# Patient Record
Sex: Male | Born: 1950 | Race: Black or African American | Hispanic: No | Marital: Married | Smoking: Never smoker
Health system: Southern US, Community
[De-identification: ages and names within clinical notes are randomized; demographics above are authoritative.]

## PROBLEM LIST (undated history)

## (undated) DIAGNOSIS — T451X5A Adverse effect of antineoplastic and immunosuppressive drugs, initial encounter: Secondary | ICD-10-CM

## (undated) DIAGNOSIS — I2510 Atherosclerotic heart disease of native coronary artery without angina pectoris: Secondary | ICD-10-CM

## (undated) DIAGNOSIS — G620 Drug-induced polyneuropathy: Secondary | ICD-10-CM

## (undated) DIAGNOSIS — H269 Unspecified cataract: Secondary | ICD-10-CM

## (undated) DIAGNOSIS — R351 Nocturia: Secondary | ICD-10-CM

## (undated) DIAGNOSIS — Z8719 Personal history of other diseases of the digestive system: Secondary | ICD-10-CM

## (undated) DIAGNOSIS — N1330 Unspecified hydronephrosis: Secondary | ICD-10-CM

## (undated) DIAGNOSIS — C8190 Hodgkin lymphoma, unspecified, unspecified site: Secondary | ICD-10-CM

## (undated) DIAGNOSIS — I10 Essential (primary) hypertension: Secondary | ICD-10-CM

## (undated) DIAGNOSIS — K649 Unspecified hemorrhoids: Secondary | ICD-10-CM

## (undated) DIAGNOSIS — N289 Disorder of kidney and ureter, unspecified: Secondary | ICD-10-CM

## (undated) DIAGNOSIS — Z860100 Personal history of colon polyps, unspecified: Secondary | ICD-10-CM

## (undated) DIAGNOSIS — G4730 Sleep apnea, unspecified: Secondary | ICD-10-CM

## (undated) DIAGNOSIS — E785 Hyperlipidemia, unspecified: Secondary | ICD-10-CM

## (undated) DIAGNOSIS — E039 Hypothyroidism, unspecified: Secondary | ICD-10-CM

## (undated) DIAGNOSIS — G459 Transient cerebral ischemic attack, unspecified: Secondary | ICD-10-CM

## (undated) DIAGNOSIS — I219 Acute myocardial infarction, unspecified: Secondary | ICD-10-CM

## (undated) DIAGNOSIS — C73 Malignant neoplasm of thyroid gland: Secondary | ICD-10-CM

## (undated) DIAGNOSIS — Z9889 Other specified postprocedural states: Secondary | ICD-10-CM

## (undated) HISTORY — DX: Disorder of kidney and ureter, unspecified: N28.9

## (undated) HISTORY — PX: EYE SURGERY: SHX253

## (undated) HISTORY — PX: TONSILLECTOMY: SUR1361

## (undated) HISTORY — DX: Unspecified cataract: H26.9

## (undated) HISTORY — DX: Unspecified hydronephrosis: N13.30

## (undated) HISTORY — PX: ESOPHAGOGASTRODUODENOSCOPY: SHX1529

## (undated) HISTORY — PX: COLONOSCOPY: SHX174

## (undated) HISTORY — DX: Essential (primary) hypertension: I10

## (undated) HISTORY — DX: Atherosclerotic heart disease of native coronary artery without angina pectoris: I25.10

## (undated) HISTORY — DX: Hodgkin lymphoma, unspecified, unspecified site: C81.90

## (undated) HISTORY — DX: Hyperlipidemia, unspecified: E78.5

## (undated) HISTORY — DX: Malignant neoplasm of thyroid gland: C73

## (undated) HISTORY — PX: APPENDECTOMY: SHX54

---

## 1976-03-15 HISTORY — PX: BUNIONECTOMY: SHX129

## 1978-03-15 HISTORY — PX: VASECTOMY: SHX75

## 1997-10-21 ENCOUNTER — Ambulatory Visit (HOSPITAL_COMMUNITY): Admission: RE | Admit: 1997-10-21 | Discharge: 1997-10-21 | Payer: Self-pay | Admitting: Gastroenterology

## 2003-09-03 ENCOUNTER — Ambulatory Visit (HOSPITAL_COMMUNITY): Admission: RE | Admit: 2003-09-03 | Discharge: 2003-09-03 | Payer: Self-pay | Admitting: Gastroenterology

## 2003-09-03 ENCOUNTER — Encounter (INDEPENDENT_AMBULATORY_CARE_PROVIDER_SITE_OTHER): Payer: Self-pay | Admitting: Specialist

## 2009-03-15 DIAGNOSIS — G459 Transient cerebral ischemic attack, unspecified: Secondary | ICD-10-CM

## 2009-03-15 DIAGNOSIS — I219 Acute myocardial infarction, unspecified: Secondary | ICD-10-CM

## 2009-03-15 HISTORY — DX: Acute myocardial infarction, unspecified: I21.9

## 2009-03-15 HISTORY — DX: Transient cerebral ischemic attack, unspecified: G45.9

## 2009-09-05 ENCOUNTER — Ambulatory Visit: Payer: Self-pay | Admitting: Family Medicine

## 2009-09-24 ENCOUNTER — Ambulatory Visit (HOSPITAL_COMMUNITY): Admission: RE | Admit: 2009-09-24 | Discharge: 2009-09-24 | Payer: Self-pay | Admitting: Family Medicine

## 2009-09-24 ENCOUNTER — Ambulatory Visit: Payer: Self-pay

## 2009-09-24 ENCOUNTER — Encounter: Payer: Self-pay | Admitting: Family Medicine

## 2009-09-24 ENCOUNTER — Ambulatory Visit: Payer: Self-pay | Admitting: Cardiology

## 2009-10-06 ENCOUNTER — Ambulatory Visit: Payer: Self-pay | Admitting: Family Medicine

## 2009-10-13 ENCOUNTER — Telehealth (INDEPENDENT_AMBULATORY_CARE_PROVIDER_SITE_OTHER): Payer: Self-pay | Admitting: *Deleted

## 2009-10-13 ENCOUNTER — Ambulatory Visit: Payer: Self-pay | Admitting: Family Medicine

## 2009-10-14 ENCOUNTER — Encounter: Payer: Self-pay | Admitting: Cardiology

## 2009-10-14 ENCOUNTER — Ambulatory Visit: Payer: Self-pay

## 2009-10-14 ENCOUNTER — Inpatient Hospital Stay (HOSPITAL_COMMUNITY): Admission: AD | Admit: 2009-10-14 | Discharge: 2009-10-22 | Payer: Self-pay | Admitting: Cardiology

## 2009-10-14 ENCOUNTER — Ambulatory Visit: Payer: Self-pay | Admitting: Cardiology

## 2009-10-14 ENCOUNTER — Encounter (HOSPITAL_COMMUNITY)
Admission: RE | Admit: 2009-10-14 | Discharge: 2009-12-10 | Payer: Self-pay | Source: Home / Self Care | Admitting: Family Medicine

## 2009-10-15 ENCOUNTER — Ambulatory Visit: Payer: Self-pay | Admitting: Thoracic Surgery (Cardiothoracic Vascular Surgery)

## 2009-10-15 ENCOUNTER — Encounter: Payer: Self-pay | Admitting: Cardiovascular Disease

## 2009-10-15 ENCOUNTER — Encounter: Payer: Self-pay | Admitting: Cardiology

## 2009-10-15 HISTORY — PX: CARDIAC CATHETERIZATION: SHX172

## 2009-10-16 ENCOUNTER — Encounter: Payer: Self-pay | Admitting: Cardiology

## 2009-10-17 HISTORY — PX: CORONARY ARTERY BYPASS GRAFT: SHX141

## 2009-10-27 ENCOUNTER — Telehealth: Payer: Self-pay | Admitting: Family Medicine

## 2009-11-06 ENCOUNTER — Ambulatory Visit: Payer: Self-pay | Admitting: Cardiology

## 2009-11-07 ENCOUNTER — Telehealth (INDEPENDENT_AMBULATORY_CARE_PROVIDER_SITE_OTHER): Payer: Self-pay | Admitting: *Deleted

## 2009-11-14 ENCOUNTER — Ambulatory Visit: Payer: Self-pay | Admitting: Thoracic Surgery (Cardiothoracic Vascular Surgery)

## 2009-11-14 ENCOUNTER — Encounter
Admission: RE | Admit: 2009-11-14 | Discharge: 2009-11-14 | Payer: Self-pay | Admitting: Thoracic Surgery (Cardiothoracic Vascular Surgery)

## 2009-11-14 ENCOUNTER — Encounter: Payer: Self-pay | Admitting: Family Medicine

## 2009-11-17 ENCOUNTER — Encounter: Payer: Self-pay | Admitting: Cardiology

## 2009-11-20 ENCOUNTER — Telehealth (INDEPENDENT_AMBULATORY_CARE_PROVIDER_SITE_OTHER): Payer: Self-pay | Admitting: *Deleted

## 2009-11-20 ENCOUNTER — Encounter (HOSPITAL_COMMUNITY): Admission: RE | Admit: 2009-11-20 | Discharge: 2009-12-12 | Payer: Self-pay | Admitting: Cardiology

## 2009-12-04 ENCOUNTER — Ambulatory Visit: Payer: Self-pay | Admitting: Cardiology

## 2009-12-10 ENCOUNTER — Ambulatory Visit: Payer: Self-pay | Admitting: Cardiology

## 2009-12-10 ENCOUNTER — Encounter (INDEPENDENT_AMBULATORY_CARE_PROVIDER_SITE_OTHER): Payer: Self-pay | Admitting: *Deleted

## 2009-12-10 LAB — CONVERTED CEMR LAB
AST: 15 units/L (ref 0–37)
BUN: 15 mg/dL (ref 6–23)
Bilirubin, Direct: 0.1 mg/dL (ref 0.0–0.3)
Chloride: 104 meq/L (ref 96–112)
Cholesterol: 110 mg/dL (ref 0–200)
Creatinine, Ser: 1.2 mg/dL (ref 0.4–1.5)
Eosinophils Absolute: 0.1 10*3/uL (ref 0.0–0.7)
Eosinophils Relative: 2 % (ref 0.0–5.0)
GFR calc non Af Amer: 82.31 mL/min (ref >60)
Glucose, Bld: 91 mg/dL (ref 70–99)
HCT: 38.2 % — ABNORMAL LOW (ref 39.0–52.0)
HDL: 35.7 mg/dL — ABNORMAL LOW (ref >39.00)
Hemoglobin: 12.6 g/dL — ABNORMAL LOW (ref 13.0–17.0)
LDL Cholesterol: 65 mg/dL (ref 0–99)
Lymphs Abs: 1.5 10*3/uL (ref 0.7–4.0)
MCHC: 32.9 g/dL (ref 30.0–36.0)
Monocytes Absolute: 0.5 10*3/uL (ref 0.1–1.0)
Monocytes Relative: 7.4 % (ref 3.0–12.0)
RBC: 4.76 M/uL (ref 4.22–5.81)
Total CHOL/HDL Ratio: 3
Total Protein: 6.8 g/dL (ref 6.0–8.3)
VLDL: 9.6 mg/dL (ref 0.0–40.0)

## 2009-12-13 ENCOUNTER — Encounter (HOSPITAL_COMMUNITY)
Admission: RE | Admit: 2009-12-13 | Discharge: 2010-02-20 | Payer: Self-pay | Source: Home / Self Care | Attending: Cardiology | Admitting: Cardiology

## 2009-12-15 ENCOUNTER — Ambulatory Visit: Payer: Self-pay | Admitting: Family Medicine

## 2010-01-16 ENCOUNTER — Encounter: Payer: Self-pay | Admitting: Cardiology

## 2010-01-19 ENCOUNTER — Telehealth: Payer: Self-pay | Admitting: Family Medicine

## 2010-01-20 ENCOUNTER — Ambulatory Visit: Payer: Self-pay | Admitting: Family Medicine

## 2010-01-23 ENCOUNTER — Encounter: Payer: Self-pay | Admitting: Cardiology

## 2010-01-30 ENCOUNTER — Encounter: Payer: Self-pay | Admitting: Family Medicine

## 2010-03-31 ENCOUNTER — Telehealth (INDEPENDENT_AMBULATORY_CARE_PROVIDER_SITE_OTHER): Payer: Self-pay | Admitting: *Deleted

## 2010-04-05 ENCOUNTER — Encounter: Payer: Self-pay | Admitting: Pediatrics

## 2010-04-05 ENCOUNTER — Encounter: Payer: Self-pay | Admitting: Thoracic Surgery (Cardiothoracic Vascular Surgery)

## 2010-04-12 LAB — CONVERTED CEMR LAB
ALT: 10 units/L (ref 0–53)
AST: 19 units/L (ref 0–37)
Albumin: 4.6 g/dL (ref 3.5–5.2)
Alkaline Phosphatase: 61 units/L (ref 39–117)
BUN: 13 mg/dL (ref 6–23)
Basophils Absolute: 0 10*3/uL (ref 0.0–0.1)
Basophils Relative: 0 % (ref 0–1)
Calcium: 9.8 mg/dL (ref 8.4–10.5)
Chloride: 106 meq/L (ref 96–112)
Creatinine, Ser: 1.33 mg/dL (ref 0.40–1.50)
Eosinophils Absolute: 0.1 10*3/uL (ref 0.0–0.7)
Eosinophils Relative: 1.8 % (ref 0.0–5.0)
GFR calc non Af Amer: 84.01 mL/min (ref >60)
Glucose, Bld: 109 mg/dL — ABNORMAL HIGH (ref 70–99)
H Pylori IgG: NEGATIVE
HCT: 42.4 % (ref 39.0–52.0)
HDL: 50 mg/dL (ref >39)
Lymphocytes Relative: 35 % (ref 12–46)
MCHC: 33.4 g/dL (ref 30.0–36.0)
MCHC: 34.6 g/dL (ref 30.0–36.0)
MCV: 83.5 fL (ref 78.0–100.0)
MCV: 85 fL (ref 78.0–100.0)
Monocytes Absolute: 0.6 10*3/uL (ref 0.1–1.0)
Monocytes Relative: 7 % (ref 3–12)
Monocytes Relative: 8.1 % (ref 3.0–12.0)
Neutro Abs: 4.9 10*3/uL (ref 1.4–7.7)
Neutrophils Relative %: 65.4 % (ref 43.0–77.0)
Platelets: 310 10*3/uL (ref 150.0–400.0)
Potassium: 4.5 meq/L (ref 3.5–5.1)
RBC: 5.45 M/uL (ref 4.22–5.81)
RDW: 14.8 % (ref 11.5–15.5)
RDW: 15.2 % — ABNORMAL HIGH (ref 11.5–14.6)
Sodium: 137 meq/L (ref 135–145)
Sodium: 143 meq/L (ref 135–145)
TSH: 0.16 microintl units/mL — ABNORMAL LOW (ref 0.35–5.50)
WBC: 6.9 10*3/uL (ref 4.0–10.5)

## 2010-04-16 NOTE — Letter (Signed)
Summary: [facility]  [facility]   Imported By: Marylou Mccoy 12/15/2009 10:47:19  _____________________________________________________________________  External Attachment:    Type:   Image     Comment:   External Document

## 2010-04-16 NOTE — Assessment & Plan Note (Signed)
Summary: 4-5 WKS   Visit Type:  Follow-up Primary Journey [place]:  Laury Axon  CC:  Pt concerned about his heartrate in the 90's and 80's-Pt also wants to know when can he return to work.  History of Present Illness: Devon Brady is 60 years old and return for management of CAD. 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 He's been involved in the rehabilitation program since that time and has done very well. She had no recent chest pain shortness of breath or palpitations.  His other problems include hypertension hyperlipidemia.  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  Current Medications (verified): 1)  Metoprolol Succinate 25 Mg Xr24h-Tab (Metoprolol Succinate) .... Take One Tablet By Mouth Daily 2)  Aspirin 325 Mg Tabs (Aspirin) .Marland Kitchen.. 1 By Mouth Once Daily 3)  Crestor 20 Mg Tabs (Rosuvastatin Calcium) .... Take One Tablet By Mouth Daily.  Allergies (verified): No Known Drug Allergies  Past History:  Past Medical History: Reviewed history from 09/05/2009 and no changes required. Hyperlipidemia Hypertension  Review of Systems       ROS is negative except as outlined in HPI.   Vital Signs:  Patient profile:   60 year old male Height:      71 inches Weight:      246 pounds BMI:     34.43 Pulse rate:   82 / minute BP sitting:   108 / 73  (left arm) Cuff size:   large  Vitals Entered By: Burnett Kanaris, CNA (December 10, 2009 11:03 AM)  Physical Exam  Additional Exam:  Gen. Well-nourished, in no distress   Neck: No JVD, thyroid not enlarged, no carotid bruits Lungs: No tachypnea, clear without rales, rhonchi or wheezes Cardiovascular: Rhythm regular, PMI not displaced,  heart sounds  normal, no murmurs or gallops, no peripheral edema, pulses normal in all 4 extremities. Abdomen: BS normal, abdomen soft and non-tender without masses or organomegaly, no hepatosplenomegaly. MS: No  deformities, no cyanosis or clubbing   Neuro:  No focal sns   Skin:  no lesions    Impression & Recommendations:  Problem # 1:  CAD, AUTOLOGOUS BYPASS GRAFT (ICD-414.02)  His almost 2 months status post bypass surgery. He's had no chest pain this problem appears stable. We will clear him to return to work on October 10. His updated medication list for this problem includes:    Metoprolol Succinate 50 Mg Xr24h-tab (Metoprolol succinate) .Marland Kitchen... Take one tablet by mouth daily    Aspirin 325 Mg Tabs (Aspirin) .Marland Kitchen... 1 by mouth once daily  His updated medication list for this problem includes:    Metoprolol Succinate 25 Mg Xr24h-tab (Metoprolol succinate) .Marland Kitchen... Take one tablet by mouth daily    Aspirin 325 Mg Tabs (Aspirin) .Marland Kitchen... 1 by mouth once daily  Orders: EKG w/ Interpretation (93000) TLB-TSH (Thyroid Stimulating Hormone) (84443-TSH)  Problem # 2:  HYPERTENSION (ICD-401.9)  This is been well-controlled on current medications. His updated medication list for this problem includes:    Metoprolol Succinate 50 Mg Xr24h-tab (Metoprolol succinate) .Marland Kitchen... Take one tablet by mouth daily    Aspirin 325 Mg Tabs (Aspirin) .Marland Kitchen... 1 by mouth once daily  His updated medication list for this problem includes:    Metoprolol Succinate 25 Mg Xr24h-tab (Metoprolol succinate) .Marland Kitchen... Take one tablet by mouth daily    Aspirin 325 Mg Tabs (Aspirin) .Marland Kitchen... 1 by mouth once daily  Problem # 3:  SINUS TACHYCARDIA (ICD-427.89) He has borderline sinus tachycardia with no obvious cause. His hemoglobin was okay recently. We will get a TSH. Increase his Toprol from 25 to 50 a day. His updated medication list for this problem includes:    Metoprolol Succinate 50 Mg Xr24h-tab (Metoprolol succinate) .Marland Kitchen... Take one tablet by mouth daily    Aspirin 325 Mg Tabs (Aspirin) .Marland Kitchen... 1 by mouth once daily  Problem # 4:  HYPERLIPIDEMIA (ICD-272.4) His recent lipid profile was very good with the exception of an HDL of 36. I  encouraged him to eliminate the back carbohydrates and get into more exercise. His updated medication list for this problem includes:    Crestor 20 Mg Tabs (Rosuvastatin calcium) .Marland Kitchen... Take one tablet by mouth daily.  Patient Instructions: 1)  Your physician recommends that you have lab work today: tsh (414.01) 2)  Increase Toprol (metoprolol) to 50mg  once daily. 3)  Your physician wants you to follow-up in: 6 months with Dr. Clifton James. You will receive a reminder letter in the mail two months in advance. If you don't receive a letter, please call our office to schedule the follow-up appointment.

## 2010-04-16 NOTE — Letter (Signed)
Summary: [facility]   Imported By: Marylou Mccoy 01/02/2010 15:40:28  _____________________________________________________________________  External Attachment:    Type:   Image     Comment:   External Document

## 2010-04-16 NOTE — Assessment & Plan Note (Signed)
Summary: new to est//dr Salma ref//lch   Vital Signs:  Patient profile:   60 year old male Height:      71 inches Weight:      266.31 pounds BMI:     37.28 Temp:     98.1 degrees F oral Pulse rate:   82 / minute Pulse rhythm:   regular BP sitting:   138 / 82  (left arm) Cuff size:   large  Vitals Entered By: Army Fossa CMA (September 05, 2009 2:06 PM) CC: Pt here to est, and CPX   History of Present Illness: Pt here for cpe and to established.  Pt has a hx of high cholesterol and bp. Crestor caused muscle aches.  He was never on bp meds.    Preventive Screening-Counseling & Management  Alcohol-Tobacco     Alcohol drinks/day: <1     Alcohol type: beer     Smoking Status: never  Caffeine-Diet-Exercise     Caffeine use/day: <1     Diet Comments: healthy eating     Does Patient Exercise: yes     Type of exercise: walking     Exercise (avg: min/session): 30-60     Times/week: 3  Hep-HIV-STD-Contraception     Dental Visit-last 6 months yes     Dental Care Counseling: not indicated; dental care within six months      Sexual History:  currently monogamous.        Drug Use:  no.    Current Medications (verified): 1)  None  Allergies (verified): No Known Drug Allergies  Past History:  Family History: Last updated: 09/05/2009 Family History of Alcoholism/Addiction Family History of Arthritis Family History Diabetes 1st degree relative--dad Family History Kidney disease Family History of Prostate CA --great uncle Family History High cholesterol Family History Hypertension  Social History: Last updated: 09/05/2009 Married Never Smoked Alcohol use-yes Drug use-no Regular exercise-no Occupation:  Regional environmental specialist  Risk Factors: Alcohol Use: <1 (09/05/2009) Caffeine Use: <1 (09/05/2009) Diet: healthy eating (09/05/2009) Exercise: yes (09/05/2009)  Risk Factors: Smoking Status: never (09/05/2009)  Past Medical  History: Hyperlipidemia Hypertension  Past Surgical History: Vasectomy bunionectomy Tonsillectomy  Family History: Reviewed history and no changes required. Family History of Alcoholism/Addiction Family History of Arthritis Family History Diabetes 1st degree relative--dad Family History Kidney disease Family History of Prostate CA --great uncle Family History High cholesterol Family History Hypertension  Social History: Reviewed history and no changes required. Married Never Smoked Alcohol use-yes Drug use-no Regular exercise-no Occupation:  Archivist Smoking Status:  never Drug Use:  no Does Patient Exercise:  yes Caffeine use/day:  <1 Dental Care w/in 6 mos.:  yes Sexual History:  currently monogamous Occupation:  employed  Review of Systems      See HPI General:  Denies chills, fatigue, fever, loss of appetite, malaise, sleep disorder, sweats, weakness, and weight loss. Eyes:  Denies blurring, discharge, double vision, eye irritation, eye pain, halos, itching, light sensitivity, red eye, vision loss-1 eye, and vision loss-both eyes; optho--q2y. ENT:  Denies decreased hearing, difficulty swallowing, ear discharge, earache, hoarseness, nasal congestion, nosebleeds, postnasal drainage, ringing in ears, sinus pressure, and sore throat. CV:  Denies bluish discoloration of lips or nails, chest pain or discomfort, difficulty breathing at night, difficulty breathing while lying down, fainting, fatigue, leg cramps with exertion, lightheadness, near fainting, palpitations, shortness of breath with exertion, swelling of feet, swelling of hands, and weight gain. Resp:  Denies chest discomfort, chest pain with inspiration, cough, coughing up  blood, excessive snoring, hypersomnolence, morning headaches, pleuritic, shortness of breath, sputum productive, and wheezing. GI:  Denies abdominal pain, bloody stools, change in bowel habits, constipation, dark tarry  stools, diarrhea, excessive appetite, gas, hemorrhoids, indigestion, loss of appetite, and nausea. GU:  Denies decreased libido, discharge, dysuria, erectile dysfunction, genital sores, hematuria, incontinence, nocturia, urinary frequency, and urinary hesitancy. MS:  Denies joint pain, joint redness, joint swelling, loss of strength, low back pain, mid back pain, muscle aches, muscle , cramps, muscle weakness, stiffness, and thoracic pain. Derm:  Denies changes in color of skin, changes in nail beds, dryness, excessive perspiration, flushing, hair loss, insect bite(s), itching, lesion(s), poor wound healing, and rash. Neuro:  Denies brief paralysis, difficulty with concentration, disturbances in coordination, falling down, headaches, inability to speak, memory loss, numbness, poor balance, seizures, sensation of room spinning, tingling, tremors, visual disturbances, and weakness. Psych:  Denies alternate hallucination ( auditory/visual), anxiety, depression, easily angered, easily tearful, irritability, mental problems, panic attacks, sense of great danger, suicidal thoughts/plans, thoughts of violence, unusual visions or sounds, and thoughts /plans of harming others. Endo:  Denies cold intolerance, excessive hunger, excessive thirst, excessive urination, heat intolerance, polyuria, and weight change. Heme:  Denies abnormal bruising, bleeding, enlarge lymph nodes, fevers, pallor, and skin discoloration. Allergy:  Denies hives or rash, itching eyes, persistent infections, seasonal allergies, and sneezing.  Physical Exam  General:  Well-developed,well-nourished,in no acute distress; alert,appropriate and cooperative throughout examination Head:  Normocephalic and atraumatic without obvious abnormalities. No apparent alopecia or balding. Eyes:  pupils equal, pupils round, pupils reactive to light, and no injection.   Ears:  External ear exam shows no significant lesions or deformities.  Otoscopic  examination reveals clear canals, tympanic membranes are intact bilaterally without bulging, retraction, inflammation or discharge. Hearing is grossly normal bilaterally. Nose:  External nasal examination shows no deformity or inflammation. Nasal mucosa are pink and moist without lesions or exudates. Mouth:  Oral mucosa and oropharynx without lesions or exudates.   Neck:  No deformities, masses, or tenderness noted.no carotid bruits.   Chest Wall:  No deformities, masses, tenderness or gynecomastia noted. Lungs:  Normal respiratory effort, chest expands symmetrically. Lungs are clear to auscultation, no crackles or wheezes. Heart:  normal rate and Grade 2  /6 systolic ejection murmur.   Abdomen:  Bowel sounds positive,abdomen soft and non-tender without organomegaly or hernias noted. + lipoma L side abd Rectal:  No external abnormalities noted. Normal sphincter tone. No rectal masses or tenderness. heme negative brown stool Genitalia:  Testes bilaterally descended without nodularity, tenderness or masses. No scrotal masses or lesions. No penis lesions or urethral discharge. Prostate:  Prostate gland firm and smooth, no enlargement, nodularity, tenderness, mass, asymmetry or induration. Msk:  normal ROM, no joint tenderness, no joint swelling, no joint warmth, no redness over joints, no joint deformities, no joint instability, and no crepitation.   Pulses:  R posterior tibial normal, R dorsalis pedis normal, R carotid normal, L posterior tibial normal, L dorsalis pedis normal, and L carotid normal.   Extremities:  No clubbing, cyanosis, edema, or deformity noted with normal full range of motion of all joints.   Neurologic:  No cranial nerve deficits noted. Station and gait are normal. Plantar reflexes are down-going bilaterally. DTRs are symmetrical throughout. Sensory, motor and coordinative functions appear intact. Skin:  Intact without suspicious lesions or rashes Cervical Nodes:  No  lymphadenopathy noted Psych:  Cognition and judgment appear intact. Alert and cooperative with normal attention span and concentration. No  apparent delusions, illusions, hallucinations   Impression & Recommendations:  Problem # 1:  PREVENTIVE HEALTH CARE (ICD-V70.0)  Orders: Venipuncture (04540) TLB-Lipid Panel (80061-LIPID) TLB-BMP (Basic Metabolic Panel-BMET) (80048-METABOL) TLB-CBC Platelet - w/Differential (85025-CBCD) TLB-Hepatic/Liver Function Pnl (80076-HEPATIC) TLB-TSH (Thyroid Stimulating Hormone) (84443-TSH) TLB-PSA (Prostate Specific Antigen) (84153-PSA) EKG w/ Interpretation (93000)  Reviewed preventive care protocols, scheduled due services, and updated immunizations.  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)  Orders: Venipuncture (98119) TLB-Lipid Panel (80061-LIPID) TLB-BMP (Basic Metabolic Panel-BMET) (80048-METABOL) TLB-CBC Platelet - w/Differential (85025-CBCD) TLB-Hepatic/Liver Function Pnl (80076-HEPATIC) TLB-TSH (Thyroid Stimulating Hormone) (84443-TSH) TLB-PSA (Prostate Specific Antigen) (84153-PSA) EKG w/ Interpretation (93000)  Problem # 4:  HYPERLIPIDEMIA (ICD-272.4)  Orders: Venipuncture (14782) TLB-Lipid Panel (80061-LIPID) TLB-BMP (Basic Metabolic Panel-BMET) (80048-METABOL) TLB-CBC Platelet - w/Differential (85025-CBCD) TLB-Hepatic/Liver Function Pnl (80076-HEPATIC) TLB-TSH (Thyroid Stimulating Hormone) (84443-TSH) TLB-PSA (Prostate Specific Antigen) (84153-PSA) EKG w/ Interpretation (93000)  Problem # 5:  HYPERTENSION (ICD-401.9) Assessment: Improved  Orders: Venipuncture (95621) TLB-Lipid Panel (80061-LIPID) TLB-BMP (Basic Metabolic Panel-BMET) (80048-METABOL) TLB-CBC Platelet - w/Differential (85025-CBCD) TLB-Hepatic/Liver Function Pnl (80076-HEPATIC) TLB-TSH (Thyroid Stimulating Hormone)  (84443-TSH) TLB-PSA (Prostate Specific Antigen) (84153-PSA) EKG w/ Interpretation (93000)  BP today: 138/82   EKG  Procedure date:  09/05/2009  Findings:      Normal sinus rhythm with rate of:  88 bpm    Flu Vaccine Next Due:  Refused Colonoscopy Result Date:  09/25/2008 Colonoscopy Result:  polyps Colonoscopy Next Due:  5 yr  Appended Document: new to est//dr Salma ref//lch  Laboratory Results   Urine Tests   Date/Time Reported: September 05, 2009 3:07 PM   Routine Urinalysis   Color: yellow Appearance: Clear Glucose: negative   (Normal Range: Negative) Bilirubin: negative   (Normal Range: Negative) Ketone: smal (15)   (Normal Range: Negative) Spec. Gravity: >=1.030   (Normal Range: 1.003-1.035) Blood: trace-lysed   (Normal Range: Negative) pH: 5.0   (Normal Range: 5.0-8.0) Protein: 30   (Normal Range: Negative) Urobilinogen: negative   (Normal Range: 0-1) Nitrite: negative   (Normal Range: Negative) Leukocyte Esterace: negative   (Normal Range: Negative)    Comments: Floydene Flock  September 05, 2009 3:08 PM cx sent

## 2010-04-16 NOTE — Consult Note (Signed)
[facility]   Imported By: Roderic Ovens 10/22/2009 16:07:28  _____________________________________________________________________  External Attachment:    Type:   Image     Comment:   External Document

## 2010-04-16 NOTE — Progress Notes (Signed)
  Pt left FMLA papers @ visit yesterday sent to [facility]  November 07, 2009 11:17 AM

## 2010-04-16 NOTE — Assessment & Plan Note (Signed)
Summary: chest pressure x 2weeks//lch   Vital Signs:  Patient profile:   60 year old male Height:      71 inches Weight:      271 pounds Temp:     98.3 degrees F oral Pulse rate:   80 / minute BP sitting:   140 / 100  (left arm)  Vitals Entered By: Jeremy Johann CMA (October 06, 2009 11:37 AM) CC: pressure and burning in chest increase with ambulation   History of Present Illness:       This is a Devon Brady who presents with Chest Pain.  The symptoms began 2 weeks ago.  Pt here c/o chest pressure with exertion for 2 weeks.  It goes away with rest.  The patient reports exertional chest pain, but denies resting chest pain, nausea, vomiting, diaphoresis, shortness of breath, palpitations, dizziness, light headedness, syncope, and indigestion.  The pain is described as intermittent and pressure-like.  The pain is located in the substernal area.  The pain radiates to the left upper chest and left shoulder.  Episodes of chest pain last 2-5 minutes.  The pain is brought on or made worse by moderate activity and strenuous activity.  The pain is relieved or improved with rest.  Pt also states pain occurs with certain foods---ie peanuts.    Current Medications (verified): 1)  Toprol Xl 50 Mg Xr24h-Tab (Metoprolol Succinate) .Marland Kitchen.. 1 By Mouth Once Daily 2)  Aspir-Low 81 Mg Tbec (Aspirin) .Marland Kitchen.. 1 By Mouth Once Daily  Allergies (verified): No Known Drug Allergies  Past History:  Past medical, surgical, family and social histories (including risk factors) reviewed for relevance to current acute and chronic problems.  Past Medical History: Reviewed history from 09/05/2009 and no changes required. Hyperlipidemia Hypertension  Past Surgical History: Reviewed history from 09/05/2009 and no changes required. Vasectomy bunionectomy Tonsillectomy  Family History: Reviewed history from 09/05/2009 and no changes required. Family History of Alcoholism/Addiction Family History of  Arthritis Family History Diabetes 1st degree relative--dad Family History Kidney disease Family History of Prostate CA --great uncle Family History High cholesterol Family History Hypertension  Social History: Reviewed history from 09/05/2009 and no changes required. Married Never Smoked Alcohol use-yes Drug use-no Regular exercise-no Occupation:  Archivist  Review of Systems      See HPI  Physical Exam  General:  Well-developed,well-nourished,in no acute distress; alert,appropriate and cooperative throughout examination Lungs:  Normal respiratory effort, chest expands symmetrically. Lungs are clear to auscultation, no crackles or wheezes. Heart:  normal rate and Grade  2 /6 systolic ejection murmur.   Abdomen:  Bowel sounds positive,abdomen soft and non-tender without masses, organomegaly or hernias noted. Cervical Nodes:  No lymphadenopathy noted Psych:  Cognition and judgment appear intact. Alert and cooperative with normal attention span and concentration. No apparent delusions, illusions, hallucinations   Impression & Recommendations:  Problem # 1:  CHEST PAIN UNSPECIFIED (ICD-786.50)  Orders: Venipuncture (84696) TLB-BMP (Basic Metabolic Panel-BMET) (80048-METABOL) TLB-CBC Platelet - w/Differential (85025-CBCD) TLB-Hepatic/Liver Function Pnl (80076-HEPATIC) TLB-Cardiac Panel ([id number]-MWNU) TLB-H. Pylori Abs(Helicobacter Pylori) (86677-HELICO) Cardiolite (Cardiolite) EKG w/ Interpretation (93000)  Problem # 2:  DYSPEPSIA (ICD-536.8) GI cocktail given in office Take prilosec otc 1 by mouth once daily  Orders: Venipuncture (27253) TLB-BMP (Basic Metabolic Panel-BMET) (80048-METABOL) TLB-CBC Platelet - w/Differential (85025-CBCD) TLB-Hepatic/Liver Function Pnl (80076-HEPATIC) TLB-Cardiac Panel ([id number]) TLB-H. Pylori Abs(Helicobacter Pylori) (86677-HELICO) EKG w/ Interpretation (93000)  Problem # 3:  HYPERTENSION  (ICD-401.9)  His updated medication list for this problem includes:  Toprol Xl 50 Mg Xr24h-tab (Metoprolol succinate) .Marland Kitchen... 1 by mouth once daily  Orders: Cardiolite (Cardiolite) EKG w/ Interpretation (93000)  BP today: 140/100 Prior BP: 138/82 (09/05/2009)  Labs Reviewed: K+: 4.1 (09/05/2009) Creat: : 1.33 (09/05/2009)   Chol: 253 (09/05/2009)   HDL: 50 (09/05/2009)   LDL: 188 (09/05/2009)   TG: 77 (09/05/2009)  Problem # 4:  HYPERLIPIDEMIA (ICD-272.4)  Labs Reviewed: SGOT: 19 (09/05/2009)   SGPT: 10 (09/05/2009)   HDL:50 (09/05/2009)  LDL:188 (09/05/2009)  Chol:253 (09/05/2009)  Trig:77 (09/05/2009)  Orders: EKG w/ Interpretation (93000)  Complete Medication List: 1)  Toprol Xl 50 Mg Xr24h-tab (Metoprolol succinate) .Marland Kitchen.. 1 by mouth once daily 2)  Aspirin 325 Mg Tabs (Aspirin) .Marland Kitchen.. 1 by mouth once daily  Patient Instructions: 1)  rto 1 week  2)  if chest pain returns go to ER  Prescriptions: TOPROL XL 50 MG XR24H-TAB (METOPROLOL SUCCINATE) 1 by mouth once daily  #30 x 2   Entered and Authorized by:   Loreen Freud DO   Signed by:   Loreen Freud DO on 10/06/2009   Method used:   Electronically to        [facility]  [address] [phone][facility] (retail)       [address]       [place], [place]  [address]       Ph: [phone]       Fax: [phone]   RxID:   [id number]    EKG  Procedure date:  10/06/2009  Findings:      Normal sinus rhythm with rate of:  84 Inc RBBB

## 2010-04-16 NOTE — Letter (Signed)
Summary: Fax Regarding Disease Mgmt Program/[facility]  Fax Regarding Disease Mgmt Program/[facility]   Imported By: Lanelle Bal 02/09/2010 11:52:49  _____________________________________________________________________  External Attachment:    Type:   Image     Comment:   External Document

## 2010-04-16 NOTE — Assessment & Plan Note (Signed)
Summary: np6   Visit Type:  Initial Consult Primary Devon Brady:  Devon Brady  CC:  results of stress test.  History of Present Illness: The patient is 60 years old and is seen today as an add-on because of an abnormal Myoview scan. He has been having symptoms of exertional chest pain for probably 2-3 months. These symptoms have been somewhat worse in the last 2 weeks. He notices the symptoms when he does such activities as mowing the lawn.  some s not had any rest chest pain but he has had some mild sensation in his throat sometimes comes on at rest that he attributed to heartburn.  Dr. Sol Brady and scheduled him for a Myoview scan today he completed 2 stages of 3 of the Bruce protocol and had chest pain ECG changes and a very large defect involving the septum anterior apical and inferior walls.  His other problems include hypertension which has been treated and hyperlipidemia which has been treated only with diet.  He said Dr. Laury Brady heard a murmur yesterday.  Current Medications (verified): 1)  Toprol Xl 100 Mg Xr24h-Tab (Metoprolol Succinate) .Marland Kitchen.. 1 By Mouth Once Daily 2)  Aspirin 325 Mg Tabs (Aspirin) .Marland Kitchen.. 1 By Mouth Once Daily 3)  Prilosec Otc 20 Mg Tbec (Omeprazole Magnesium) .Marland Kitchen.. 1 By Mouth Qam  Allergies (verified): No Known Drug Allergies  Past History:  Past Medical History: Reviewed history from 09/05/2009 and no changes required. Hyperlipidemia Hypertension  Family History: Reviewed history from 09/05/2009 and no changes required. Family History of Alcoholism/Addiction Family History of Arthritis Family History Diabetes 1st degree relative--dad Family History Kidney disease Family History of Prostate CA --great uncle Family History High cholesterol Family History Hypertension  Social History: Reviewed history from 09/05/2009 and no changes required. Married Never Smoked Alcohol use-yes Drug use-no Regular exercise-no Occupation:  Regional environmental  specialist  Review of Systems       ROS is negative except as outlined in HPI.   Vital Signs:  Patient profile:   60 year old male Height:      71 inches Weight:      271 pounds BMI:     37.99 Pulse rate:   76 / minute BP sitting:   140 / 88  (left arm)  Physical Exam  Additional Exam:  Gen. Well-nourished, in no distress Head: Normocephalic    Eyes: PERRLA/EOM intact; conjunctiva and lids normal Neck: No JVD, thyroid not enlarged, no carotid bruits Lungs: No tachypnea, clear w/o rales, rhonchi or wheezes CV: Rhythm regular, PMI not displaced,  sounds  nl, no murmurs or gallops, no edema, pulses nl UE and LE Abd: BS nl, abd soft and non-tender w/o masses or hepatosplenomegaly MS: No deformities Neuro: no focal sns Skin: no lesions Psych: nl affect    Impression & Recommendations:  Problem # 1:  CHEST PAIN UNSPECIFIED (ICD-786.50) He has typical exertional angina and now has an abnormal stress test and Myoview scan with a large stress-induced defect.  admission today and catheterization tomorrow.plan admission today and catheterization tomorrow. His updated medication list for this problem includes:    Toprol Xl 100 Mg Xr24h-tab (Metoprolol succinate) .Marland Kitchen... 1 by mouth once daily    Aspirin 325 Mg Tabs (Aspirin) .Marland Kitchen... 1 by mouth once daily  Problem # 2:  HYPERTENSION (ICD-401.9) This is well-controlled on current medications. His updated medication list for this problem includes:    Toprol Xl 100 Mg Xr24h-tab (Metoprolol succinate) .Marland Kitchen... 1 by mouth once daily    Aspirin 325  Mg Tabs (Aspirin) .Marland Kitchen... 1 by mouth once daily  Problem # 3:  HYPERLIPIDEMIA (ICD-272.4) We will start him on a statin tonight prior to his procedure tomorrow.  Patient Instructions: 1)  You are being admitted directly to [facility] today.

## 2010-04-16 NOTE — Letter (Signed)
Summary: Return To Work  [employer], Main Office  [address]   [place], [place] [address]   Phone: [phone]  Fax: [phone]    12/10/2009  TO: Leodis Sias IT MAY CONCERN   RE: Devon Brady [address] [place]   The above named individual is under my medical care and may return to work on: Monday 12/22/09 without restrictions.  If you have any further questions or need additional information, please call.     Sincerely,   Charlies Constable, MD Sherri Rad, RN, BSN

## 2010-04-16 NOTE — Progress Notes (Signed)
Summary: CARDIOLITE TEST DENIED   Phone Note Other Incoming Call back at [phone]   Caller: Sharyne Richters WITH [facility] Summary of Call: FYI IN REFERENCE TO CARDIOLITE STRESS TEST PERFORMED ON 10-14-2009, I JUST REC'D CALL FROM PT ACCOUNTING, SYVIA.  SYLVIA STATES THIS PATIENT'S CLAIM HAS BEEN DENIED BY HER INSURANCE [employer], FOR THE TEST OF CARDIOLITE STRESS TEST ON 10-14-2009.  THERE WAS NO PRE-CERT DONE FOR THIS TEST PRIOR TO SCHEDULING.  SYLVIA HAS RECORDING MY NAME AS A CONTACT FOR THIS ACCOUNT & STATES THAT SINCE NO PRE-CERT WAS DONE, IT DOES HER NO GOOD. Initial call taken by: Magdalen Spatz [facility],  March 31, 2010 11:22 AM  Follow-up for Phone Call        There was a pre-cert done for this. The Auth # is: [id number]. I am unsure of why it would be showing one was not done. Will call Nettie Elm to inform her of this.   Left messasge for Nettie Elm to call back. Follow-up by: Harold Barban,  April 02, 2010 8:26 AM  Additional Follow-up for Phone Call Additional follow up Details #1::        Spoke with Nettie Elm and gave her th Auth # and the date it was recieved. She will take this back to [facility].  Additional Follow-up by: Harold Barban,  April 02, 2010 11:16 AM

## 2010-04-16 NOTE — Assessment & Plan Note (Signed)
Summary: 1 WEEK FOLLOWUP////SPH   Vital Signs:  Patient profile:   60 year old male Height:      71 inches (180.34 cm) Weight:      271.38 pounds (123.35 kg) BMI:     37.99 Temp:     98.0 degrees F (36.67 degrees C) oral BP sitting:   140 / 90  (right arm) Cuff size:   large  Vitals Entered By: Lucious Groves CMA (October 13, 2009 10:52 AM) CC: 1 week follow up./kb Is Patient Diabetic? No Pain Assessment Patient in pain? no        History of Present Illness:  Hypertension follow-up      This is a 60 year old man who presents for Hypertension follow-up.  The patient denies lightheadedness, urinary frequency, headaches, edema, impotence, rash, and fatigue.  Associated symptoms include chest pain.  The patient denies the following associated symptoms: chest pressure, exercise intolerance, dyspnea, palpitations, syncope, leg edema, and pedal edema.  Compliance with medications (by patient report) has been near 100%.  The patient reports that dietary compliance has been good.  Adjunctive measures currently used by the patient include salt restriction.  Pt now admitting to reflux symptoms as well.  Current Medications (verified): 1)  Toprol Xl 100 Mg Xr24h-Tab (Metoprolol Succinate) .Marland Kitchen.. 1 By Mouth Once Daily 2)  Aspirin 325 Mg Tabs (Aspirin) .Marland Kitchen.. 1 By Mouth Once Daily 3)  Prilosec Otc 20 Mg Tbec (Omeprazole Magnesium) .Marland Kitchen.. 1 By Mouth Qam  Allergies (verified): No Known Drug Allergies  Past History:  Past Medical History: Last updated: 09/05/2009 Hyperlipidemia Hypertension  Past Surgical History: Last updated: 09/05/2009 Vasectomy bunionectomy Tonsillectomy  Family History: Last updated: 09/05/2009 Family History of Alcoholism/Addiction Family History of Arthritis Family History Diabetes 1st degree relative--dad Family History Kidney disease Family History of Prostate CA --great uncle Family History High cholesterol Family History Hypertension  Social History: Last  updated: 09/05/2009 Married Never Smoked Alcohol use-yes Drug use-no Regular exercise-no Occupation:  Regional environmental specialist  Risk Factors: Alcohol Use: <1 (09/05/2009) Caffeine Use: <1 (09/05/2009) Diet: healthy eating (09/05/2009) Exercise: yes (09/05/2009)  Risk Factors: Smoking Status: never (09/05/2009)  Family History: Reviewed history from 09/05/2009 and no changes required. Family History of Alcoholism/Addiction Family History of Arthritis Family History Diabetes 1st degree relative--dad Family History Kidney disease Family History of Prostate CA --great uncle Family History High cholesterol Family History Hypertension  Social History: Reviewed history from 09/05/2009 and no changes required. Married Never Smoked Alcohol use-yes Drug use-no Regular exercise-no Occupation:  Archivist  Review of Systems      See HPI  Physical Exam  General:  Well-developed,well-nourished,in no acute distress; alert,appropriate and cooperative throughout examination Neck:  No deformities, masses, or tenderness noted. Lungs:  Normal respiratory effort, chest expands symmetrically. Lungs are clear to auscultation, no crackles or wheezes. Heart:  Grade 2  /6 systolic ejection murmur.   normal rate and Grade   /6 systolic ejection murmur.   Extremities:  No clubbing, cyanosis, edema, or deformity noted with normal full range of motion of all joints.   Psych:  Cognition and judgment appear intact. Alert and cooperative with normal attention span and concentration. No apparent delusions, illusions, hallucinations   Impression & Recommendations:  Problem # 1:  HYPERTENSION (ICD-401.9)  His updated medication list for this problem includes:    Toprol Xl 100 Mg Xr24h-tab (Metoprolol succinate) .Marland Kitchen... 1 by mouth once daily  BP today: 140/90 Prior BP: 140/100 (10/06/2009)  Labs Reviewed: K+:  4.5 (10/06/2009) Creat: : 1.2 (10/06/2009)   Chol:  253 (09/05/2009)   HDL: 50 (09/05/2009)   LDL: 188 (09/05/2009)   TG: 77 (09/05/2009)  Problem # 2:  CARDIAC MURMUR (ICD-785.2)  Problem # 3:  CHEST PAIN UNSPECIFIED (ICD-786.50) stress test pending  Problem # 4:  DYSPEPSIA (ICD-536.8) prilosec otc 1 by mouth once daily   Complete Medication List: 1)  Toprol Xl 100 Mg Xr24h-tab (Metoprolol succinate) .Marland Kitchen.. 1 by mouth once daily 2)  Aspirin 325 Mg Tabs (Aspirin) .Marland Kitchen.. 1 by mouth once daily 3)  Prilosec Otc 20 Mg Tbec (Omeprazole magnesium) .Marland Kitchen.. 1 by mouth qam Prescriptions: PRILOSEC OTC 20 MG TBEC (OMEPRAZOLE MAGNESIUM) 1 by mouth qam  #30 x 5   Entered and Authorized by:   Loreen Freud DO   Signed by:   Loreen Freud DO on 10/13/2009   Method used:   Electronically to        [facility]  [address] [phone]* (retail)       [address]       [place], [place]  [address]       Ph: [phone]       Fax: [phone]   RxID:   [id number] TOPROL XL 100 MG XR24H-TAB (METOPROLOL SUCCINATE) 1 by mouth once daily  #30 x 2   Entered and Authorized by:   Loreen Freud DO   Signed by:   Loreen Freud DO on 10/13/2009   Method used:   [facility]  [address] [phone]* (retail)       [address]       [place], [place]  [address]       Ph: [phone]       Fax: [phone]   RxID:   [id number]

## 2010-04-16 NOTE — Assessment & Plan Note (Signed)
Summary: cough//fd   Vital Signs:  Patient profile:   60 year old male Weight:      245.4 pounds O2 Sat:      97 % on Room air Temp:     98.0 degrees F oral Pulse rate:   81 / minute Pulse rhythm:   regular BP sitting:   112 / 74  (right arm) Cuff size:   large  Vitals Entered By: Almeta Monas CMA Duncan Dull) (January 20, 2010 10:34 AM)  O2 Flow:  Room air CC: c/o cough, clear sputum, and congestion-- per pt cough x69mo, Cough   History of Present Illness:  Cough      This is a 60 year old man who presents with Cough.  The symptoms began 4 weeks ago.  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  Pt still with some sensitivity in chest.  The patient reports productive cough, but denies non-productive cough, pleuritic chest pain, shortness of breath, wheezing, exertional dyspnea, fever, hemoptysis, and malaise.  The patient denies the following symptoms: cold/URI symptoms, sore throat, nasal congestion, chronic rhinitis, weight loss, acid reflux symptoms, and peripheral edema.  The cough is worse with activity.    Current Medications (verified): 1)  Metoprolol Succinate 50 Mg Xr24h-Tab (Metoprolol Succinate) .... Take One Tablet By Mouth Daily 2)  Aspirin 325 Mg Tabs (Aspirin) .Marland Kitchen.. 1 By Mouth Once Daily 3)  Crestor 20 Mg Tabs (Rosuvastatin Calcium) .... Take One Tablet By Mouth Daily. 4)  Zithromax Z-Pak 250 Mg Tabs (Azithromycin) .... 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) stress test--10/14/2009 cath--10/15/2009  Family History: Last updated: 09/05/2009 Family History of Alcoholism/Addiction Family History of Arthritis Family History Diabetes 1st degree relative--dad Family History Kidney disease Family History of  Prostate CA --great uncle Family History High cholesterol Family History Hypertension  Social History: Last updated: 09/05/2009 Married Never Smoked Alcohol use-yes Drug use-no Regular exercise-no Occupation:  Regional environmental specialist  Risk Factors: Alcohol Use: <1 (09/05/2009) Caffeine Use: <1 (09/05/2009) Diet: healthy eating (09/05/2009) Exercise: yes (09/05/2009)  Risk Factors: Smoking Status: never (09/05/2009)  Family History: Reviewed history from 09/05/2009 and no changes required. Family History of Alcoholism/Addiction Family History of Arthritis Family History Diabetes 1st degree relative--dad Family History Kidney disease Family History of Prostate CA --great uncle Family History High cholesterol Family History Hypertension  Social History: Reviewed history from 09/05/2009 and no changes required. Married Never Smoked Alcohol use-yes Drug use-no Regular exercise-no Occupation:  Archivist  Review of Systems      See HPI  Physical Exam  General:  Well-developed,well-nourished,in no acute distress; alert,appropriate and cooperative throughout examination Ears:  External ear exam shows no significant lesions or deformities.  Otoscopic examination reveals clear canals, tympanic membranes are intact bilaterally without bulging, retraction, inflammation or discharge. Hearing is grossly normal bilaterally. Nose:  External nasal examination shows no deformity or inflammation. Nasal mucosa are pink and moist without lesions or exudates. Mouth:  Oral mucosa and oropharynx without lesions or exudates.  Teeth in good repair. Neck:  No deformities, masses, or tenderness noted. Lungs:  R rhonci decreased resp b/l  Heart:  normal rate.   Psych:  Cognition and judgment appear intact. Alert and cooperative with normal attention span and concentration. No apparent delusions, illusions, hallucinations   Impression &  Recommendations:  Problem # 1:  BRONCHITIS- ACUTE (ICD-466.0)  His updated medication list for this problem includes:    Zithromax Z-pak 250 Mg Tabs (Azithromycin) .Marland Kitchen... As directed    Mucinex for cough check cxr   Take antibiotics and other medications as directed. Encouraged to push clear liquids, get enough rest, and take acetaminophen as needed. To be seen in 5-7 days if no improvement, sooner if worse.  Complete Medication List: 1)  Metoprolol Succinate 50 Mg Xr24h-tab (Metoprolol succinate) .... Take one tablet by mouth daily 2)  Aspirin 325 Mg Tabs (Aspirin) .Marland Kitchen.. 1 by mouth once daily 3)  Crestor 20 Mg Tabs (Rosuvastatin calcium) .... Take one tablet by mouth daily. 4)  Zithromax Z-pak 250 Mg Tabs (Azithromycin) .... As directed  Other Orders: T-2 View CXR (71020TC)  Patient Instructions: 1)  Acute Bronchitis symptoms for less then 10 days are not  helped by antibiotics. Take over the counter cough medications. Call if no improvement in 5-7 days, sooner if increasing cough, fever, or new symptoms ( shortness of breath, chest pain) .  2)  Use Mucinex or Mucinex DM for cough Prescriptions: ZITHROMAX Z-PAK 250 MG TABS (AZITHROMYCIN) as directed  #1 x 0   Entered and Authorized by:   Loreen Freud DO   Signed by:   Loreen Freud DO on 01/20/2010   Method used:   [facility]  [address] [phone][facility] (retail)       [address]       [place], [place]  [address]       Ph: [phone]       Fax: [phone]   RxID:   [id number]    Orders Added: 1)  T-2 View CXR [71020TC] 2)  Est. Patient Level III [24401]

## 2010-04-16 NOTE — Progress Notes (Signed)
Summary: Nuclear Pre-Procedure  Phone Note Outgoing Call   Call placed by: Milana Na, EMT-P,  October 13, 2009 3:05 PM Summary of Call: Reviewed information on Myoview Information Sheet (see scanned document for further details).  Spoke with patient.     Nuclear Med Background Indications for Stress Test: Evaluation for Ischemia     Symptoms: Chest Pressure, Chest Pressure with Exertion    Nuclear Pre-Procedure Cardiac Risk Factors: Hypertension, Lipids, RBBB Height (in): 71  Nuclear Med Study Referring MD:  Loann Quill

## 2010-04-16 NOTE — Miscellaneous (Signed)
Summary: [facility]  [facility]   Imported By: Roderic Ovens 02/20/2010 15:01:16  _____________________________________________________________________  External Attachment:    Type:   Image     Comment:   External Document

## 2010-04-16 NOTE — Progress Notes (Signed)
  Walk in Patient Form Recieved "Pt needs Dr.Brodie to sign off on papers" sent to Message Nurse [facility]  November 20, 2009 2:50 PM

## 2010-04-16 NOTE — Progress Notes (Signed)
Summary: med causing cough  Phone Note Call from Patient Call back at Home Phone [phone]   Caller: Patient Summary of Call: patient called left msg says that he has been experiencing some coughing and he spoke w/ nurse consultant at insurance copy and was told it could be coming from metoprolol wanted to let Dr. Laury Axon to see if she thinks it could be causing cough also. Initial call taken by: Doristine Devoid CMA,  January 19, 2010 3:28 PM  Follow-up for Phone Call        no metoprolol does not normally cause a cough--- pt should be seen Follow-up by: Loreen Freud DO,  January 19, 2010 5:04 PM  Additional Follow-up for Phone Call Additional follow up Details #1::        pt aware ov scheduled for tomorrow............Marland KitchenFelecia Deloach CMA  January 19, 2010 5:12 PM

## 2010-04-16 NOTE — Progress Notes (Signed)
----   Converted from flag ---- ---- 10/24/2009 4:28 PM, Okey Regal [place] wrote: patient wanted to say thank you & to let you know he is doing well -he saw you [phone]- you sent him for tred mill - he then had cath - by pass [id number] ------------------------------

## 2010-04-16 NOTE — Letter (Signed)
Summary: [facility]  [facility]   Imported By: Lanelle Bal 12/03/2009 12:33:35  _____________________________________________________________________  External Attachment:    Type:   Image     Comment:   External Document

## 2010-04-16 NOTE — Consult Note (Signed)
Summary: [facility] Consultation  [facility]   Imported By: Roderic Ovens 04/08/2010 13:38:51  _____________________________________________________________________  External Attachment:    Type:   Image     Comment:   External Document

## 2010-04-16 NOTE — Assessment & Plan Note (Signed)
Summary: Cardiology Nuclear Testing  Nuclear Med Background Indications for Stress Test: Evaluation for Ischemia   History: Echo  History Comments: 7/11 Echo:EF=55-60%  Symptoms: Chest Pressure, Chest Pressure with Exertion, Diaphoresis, DOE  Symptoms Comments: CP>left shoulder. Last episode of NW:GNFAOZ, 10/12/09.   Nuclear Pre-Procedure Cardiac Risk Factors: Hypertension, Lipids, RBBB Caffeine/Decaff Intake: None NPO After: 7:00 AM Lungs: Clear IV 0.9% NS with Angio Cath: 18g     IV Site: (R) AC IV Started by: Stanton Kidney EMT-P Chest Size (in) 48     Height (in): 70.5 Weight (lb): 270 BMI: 38.33 Tech Comments: Toprol held > 24 hours, per patient.   Nuclear Med Study 1 or 2 day study:  1 day     Stress Test Type:  Stress Reading MD:  Olga Millers, MD     Referring MD:  Loreen Freud, DO Resting Radionuclide:  Technetium 19m Tetrofosmin     Resting Radionuclide Dose:  11 mCi  Stress Radionuclide:  Technetium 79m Tetrofosmin     Stress Radionuclide Dose:  33 mCi   Stress Protocol Exercise Time (min):  8:31 min     Max HR:  139 bpm     Predicted Max HR:  163 bpm  Max Systolic BP: 175 mm Hg     Percent Max HR:  85.28 %     METS: 10.4 Rate Pressure Product:  30865    Stress Test Technologist:  Rea College CMA-N     Nuclear Technologist:  Domenic Polite CNMT  Rest Procedure  Myocardial perfusion imaging was performed at rest 45 minutes following the intravenous administration of Myoview Technetium 70m Tetrofosmin.  Stress Procedure  The patient exercised for 8:31.  The patient stopped due to fatigue and chest pressure, 8/10.  He c/o chest pressure, 10/10, and being diaphoretic two minutes into recovery.  There were no diagnostic  ST-T wave changes, only rare PVC's and PAC's.  Myoview was injected at peak exercise and myocardial perfusion imaging was performed after a brief delay.  Patient saw Dr. Juanda Chance today and was admitted and scheduled for cath tomorrow.  QPS Raw  Data Images:  There is no interference from other nuclear activity. Stress Images:  There is decreased uptake in the inferior, anteroseptal and apical walls. Rest Images:  There is decreased uptake in the inferior wall and apex. Subtraction (SDS):  These findings are consistent with prior inferior and apical infarct; severe ischemia in the anteroseptal wall; mild peri-infarct ischemia in the inferior wall and apex. Transient Ischemic Dilatation:  1.16  (Normal <1.22)  Lung/Heart Ratio:  .37  (Normal <0.45)  Quantitative Gated Spect Images QGS EDV:  134 ml QGS ESV:  70 ml QGS EF:  48 % QGS cine images:  Akinesis of the apex; evidence of LVE; transient ischemic dilatation.   Overall Impression  Exercise Capacity: Fair exercise capacity. BP Response: Normal blood pressure response. Clinical Symptoms: There is chest pain ECG Impression: Insignificant upsloping ST segment depression. 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  Appended Document: Cardiology Nuclear Testing pt was admitted to hospital

## 2010-04-16 NOTE — Assessment & Plan Note (Signed)
Summary: eph/post cabg   Visit Type:  Follow-up Primary Kashon Kraynak:  Laury Axon  CC:  no complaints.  History of Present Illness: The patient is 60 years old and return for a followup visit after his recent bypass surgery. He had exertional chest pain for 2-3 months and had a positive Myoview and was admitted with a small non-ST elevation MI. He underwent bypass surgery on August 5. He has done well since then has had no recurrent cardiac symptoms.  His other problems include hypertension and hyperlipidemia.  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 Take One Tablet By Mouth Daily 2)  Aspirin 325 Mg Tabs (Aspirin) .Marland Kitchen.. 1 By Mouth Once Daily 3)  Crestor 20 Mg Tabs (Rosuvastatin Calcium) .... Take One Tablet By Mouth Daily. 4)  Tramadol Hcl 50 Mg Tabs (Tramadol Hcl) .... One   To  Two Tabs As Directed  Allergies (verified): No Known Drug Allergies  Past History:  Past Medical History: Reviewed history from 09/05/2009 and no changes required. Hyperlipidemia Hypertension  Review of Systems       ROS is negative except as outlined in HPI.   Vital Signs:  Patient profile:   60 year old male Height:      71 inches Weight:      256 pounds BMI:     35.83 Pulse rate:   88 / minute BP sitting:   118 / 84  (left arm) Cuff size:   large  Vitals Entered By: Burnett Kanaris, CNA  (November 06, 2009 12:52 PM)  Physical Exam  Additional Exam:  Gen. Well-nourished, in no distress   Neck: No JVD, thyroid not enlarged, no carotid bruits Lungs: No tachypnea, clear without rales, rhonchi or wheezes Cardiovascular: Rhythm regular, PMI not displaced,  heart sounds  normal, no murmurs or gallops, trace to 1+ peripheral edema right lower extremity slightly greater than the left, pulses normal in all 4 extremities. Abdomen: BS normal, abdomen soft and non-tender without masses or organomegaly, no hepatosplenomegaly. MS: No deformities, no cyanosis or clubbing,  there was some induration and fullness in the right medial thigh   Neuro:  No focal sns   Skin:  no lesions    Impression & Recommendations:  Problem # 1:  CAD, AUTOLOGOUS BYPASS GRAFT (ICD-414.02)  He had bypass surgery for severe three-vessel disease on August 5 after a very small non-ST elevation MI. He is now doing well. We encouraged him to get involved in the rehabilitation program. We'll target a return to work at about 2 months and plan to see him back in 4-5 weeks just before that time. His updated medication list for this problem includes:    Metoprolol Succinate 25 Mg Xr24h-tab (Metoprolol succinate) .Marland Kitchen... Take one tablet by mouth daily    Aspirin 325 Mg Tabs (Aspirin) .Marland Kitchen... 1 by mouth once daily  Orders: EKG w/ Interpretation (93000)  Problem # 2:  HYPERTENSION (ICD-401.9) This  is well controlled on current medications. His updated medication list for this problem includes:    Metoprolol Succinate 25 Mg Xr24h-tab (Metoprolol succinate) .Marland Kitchen... Take one tablet by mouth daily    Aspirin 325 Mg Tabs (Aspirin) .Marland Kitchen... 1 by mouth once daily  Problem # 3:  HYPERLIPIDEMIA (ICD-272.4) Hwas begun on Crestor with his bypass surgery. Will get a fasting lipid and liver profile as well as a hemoglobin A1c in 3-4 weeks. His updated medication list for this problem includes:    Crestor 20 Mg Tabs (Rosuvastatin calcium)  .Marland Kitchen... Take one tablet by mouth daily.  Patient Instructions: 1)  Your physician recommends that you return for lab work in: 4 weeks- lipid/liver/bmet/cbc/HgbA1C (414.01;272.2;402.10). 2)  Your physician recommends that you schedule a follow-up appointment in: 4-5 weeks. 3)  Your physician recommends that you continue on your current medications as directed. Please refer to the Current Medication list given to you today.

## 2010-04-16 NOTE — Assessment & Plan Note (Signed)
Summary: discuss his heart surgery//PH   Vital Signs:  Patient profile:   60 year old male Weight:      247.2 pounds BMI:     34.60 Temp:     98.5 degrees F oral Pulse rate:   76 / minute Pulse rhythm:   regular BP sitting:   120 / 70  (right arm) Cuff size:   large  Vitals Entered By: Almeta Monas CMA Duncan Dull) (December 15, 2009 2:23 PM) CC: wants to discuss heart surgery   History of Present Illness: Pt here for f/u from heart surgery---pt has been seeing cardiology regularly and just wanted to f/u here.   Dr Juanda Chance is retiring so he will be seeing Dr Zola Button.   Pt has appointment to see Dr Talmage Nap on 11/14--for hyperthyroidism.   Pt has been doing well.  No complaints.   Pt metoprolol was not at pharmacy when he checked ---we will send it in for him.      Current Medications (verified): 1)  Metoprolol Succinate 50 Mg Xr24h-Tab (Metoprolol Succinate) .... Take One Tablet By Mouth Daily 2)  Aspirin 325 Mg Tabs (Aspirin) .Marland Kitchen.. 1 By Mouth Once Daily 3)  Crestor 20 Mg Tabs (Rosuvastatin Calcium) .... Take One Tablet By Mouth Daily. 4)  Omega-3 350 Mg Caps (Omega-3 Fatty Acids) .... By Mouth Once Daily  Allergies (verified): No Known Drug Allergies  Past History:  Past medical, surgical, family and social histories (including risk factors) reviewed for relevance to current acute and chronic problems.  Past Medical History: Reviewed history from 09/05/2009 and no changes required. Hyperlipidemia Hypertension  Past Surgical History: Vasectomy bunionectomy Tonsillectomy Coronary artery bypass graft (10/17/2009) stress test--10/14/2009 cath--10/15/2009  Family History: Reviewed history from 09/05/2009 and no changes required. Family History of Alcoholism/Addiction Family History of Arthritis Family History Diabetes 1st degree relative--dad Family History Kidney disease Family History of Prostate CA --great uncle Family History High cholesterol Family History  Hypertension  Social History: Reviewed history from 09/05/2009 and no changes required. Married Never Smoked Alcohol use-yes Drug use-no Regular exercise-no Occupation:  Archivist  Review of Systems      See HPI  Physical Exam  General:  Well-developed,well-nourished,in no acute distress; alert,appropriate and cooperative throughout examination Lungs:  Normal respiratory effort, chest expands symmetrically. Lungs are clear to auscultation, no crackles or wheezes. Heart:  normal rate and Grade  2 /6 systolic ejection murmur.   Psych:  Cognition and judgment appear intact. Alert and cooperative with normal attention span and concentration. No apparent delusions, illusions, hallucinations   Impression & Recommendations:  Problem # 1:  CAD, AUTOLOGOUS BYPASS GRAFT (ICD-414.02)  His updated medication list for this problem includes:    Metoprolol Succinate 50 Mg Xr24h-tab (Metoprolol succinate) .Marland Kitchen... Take one tablet by mouth daily    Aspirin 325 Mg Tabs (Aspirin) .Marland Kitchen... 1 by mouth once daily  Labs Reviewed: Chol: 110 (12/04/2009)   HDL: 35.70 (12/04/2009)   LDL: 65 (12/04/2009)   TG: 48.0 (12/04/2009)  Problem # 2:  HYPERLIPIDEMIA (ICD-272.4)  His updated medication list for this problem includes:    Crestor 20 Mg Tabs (Rosuvastatin calcium) .Marland Kitchen... Take one tablet by mouth daily.  Problem # 3:  HYPERTENSION (ICD-401.9)  His updated medication list for this problem includes:    Metoprolol Succinate 50 Mg Xr24h-tab (Metoprolol succinate) .Marland Kitchen... Take one tablet by mouth daily  BP today: 120/70 Prior BP: 108/73 (12/10/2009)  Labs Reviewed: K+: 4.3 (12/04/2009) Creat: : 1.2 (12/04/2009)   Chol:  110 (12/04/2009)   HDL: 35.70 (12/04/2009)   LDL: 65 (12/04/2009)   TG: 48.0 (12/04/2009)  Complete Medication List: 1)  Metoprolol Succinate 50 Mg Xr24h-tab (Metoprolol succinate) .... Take one tablet by mouth daily 2)  Aspirin 325 Mg Tabs (Aspirin) .Marland Kitchen.. 1 by  mouth once daily 3)  Crestor 20 Mg Tabs (Rosuvastatin calcium) .... Take one tablet by mouth daily. 4)  Omega-3 350 Mg Caps (Omega-3 fatty acids) .... By mouth once daily  Other Orders: Admin 1st Vaccine (54098) Flu Vaccine 71yrs + (11914) Flu Vaccine Consent Questions     Do you have a history of severe allergic reactions to this vaccine? no    Any prior history of allergic reactions to egg and/or gelatin? no    Do you have a sensitivity to the preservative Thimersol? no    Do you have a past history of Guillan-Barre Syndrome? no    Do you currently have an acute febrile illness? no    Have you ever had a severe reaction to latex? no    Vaccine information given and explained to patient? yes    Are you currently pregnant? no    Lot Number:[id number]   Exp Date:09/12/2010   Site Given  Left Deltoid IM Admin 1st Vaccine (78295) Flu Vaccine 60yrs + (62130)  Patient Instructions: 1)  rto june for cpe Prescriptions: METOPROLOL SUCCINATE 50 MG XR24H-TAB (METOPROLOL SUCCINATE) Take one tablet by mouth daily  #30 x 5   Entered and Authorized by:   Loreen Freud DO   Signed by:   Loreen Freud DO on 12/15/2009   Method used:   [facility]        [facility]  [address] [phone][facility] (retail)       [address]       [place], [place]  [address]       Ph: [phone]       Fax: [phone]   RxID:   [id number]  .lbflu

## 2010-05-15 ENCOUNTER — Ambulatory Visit (INDEPENDENT_AMBULATORY_CARE_PROVIDER_SITE_OTHER): Payer: BC Managed Care – PPO | Admitting: Cardiovascular Disease

## 2010-05-15 ENCOUNTER — Encounter: Payer: Self-pay | Admitting: Cardiovascular Disease

## 2010-05-15 DIAGNOSIS — I2510 Atherosclerotic heart disease of native coronary artery without angina pectoris: Secondary | ICD-10-CM

## 2010-05-15 DIAGNOSIS — I10 Essential (primary) hypertension: Secondary | ICD-10-CM

## 2010-05-15 DIAGNOSIS — E7800 Pure hypercholesterolemia, unspecified: Secondary | ICD-10-CM

## 2010-05-21 NOTE — Assessment & Plan Note (Signed)
Summary: F6M PER CHECK OUT 12/10/09 FORMER [facility] PT/HM/TMJ   Visit Type:  Follow-up Primary Provider:  Laury Axon  CC:  sharp pain once in a while in chest. .  History of Present Illness: 60 yo AAM with history of CAD s/p CABG August 2011, HTN, hyperlipidemia who is here today for cardiac follow up. He has been followed in the past by Dr. Juanda Chance. He has occasional sharp chest pains. No exertional chest pains or SOB. NO palpitations.   Current Medications (verified): 1)  Metoprolol Succinate 50 Mg Xr24h-Tab (Metoprolol Succinate) .... Take One Tablet By Mouth Daily 2)  Aspirin 325 Mg Tabs (Aspirin) .Marland Kitchen.. 1 By Mouth Once Daily 3)  Crestor 20 Mg Tabs (Rosuvastatin Calcium) .... Take One Tablet By Mouth Daily.  Allergies (verified): No Known Drug Allergies  Past History:  Past Medical History: Hyperlipidemia Hypertension CAD s/p 5V CABG 8/11.   Past Surgical History: Vasectomy bunionectomy Tonsillectomy Coronary artery bypass graft (10/17/2009)-LIMA to LAD, SVG to Ramus, SVG to OM sequential to OM2, SVG to marginal of RCA. stress test--10/14/2009 cath--10/15/2009  Family History: Reviewed history from 09/05/2009 and no changes required. Family History of Alcoholism/Addiction Family History of Arthritis Family History Diabetes 1st degree relative--dad Family History Kidney disease Family History of Prostate CA --great uncle Family History High cholesterol Family History Hypertension  Father-deceased, CAD age 37 Mother alive, HTN  Social History: Reviewed history from 09/05/2009 and no changes required. Married, 2 children Never Smoked Alcohol use-yes Drug use-no Regular exercise-no Occupation:  Regional environmental specialist  Review of Systems  The patient denies fatigue, malaise, fever, weight gain/loss, vision loss, decreased hearing, hoarseness, chest pain, palpitations, shortness of breath, prolonged cough, wheezing, sleep apnea, coughing up blood, abdominal pain,  blood in stool, nausea, vomiting, diarrhea, heartburn, incontinence, blood in urine, muscle weakness, joint pain, leg swelling, rash, skin lesions, headache, fainting, dizziness, depression, anxiety, enlarged lymph nodes, easy bruising or bleeding, and environmental allergies.    Vital Signs:  Patient profile:   60 year old male Height:      71 inches Weight:      249.25 pounds BMI:     34.89 Pulse rate:   78 / minute Resp:     18 per minute BP sitting:   125 / 85  (left arm) Cuff size:   large  Vitals Entered By: Celestia Khat, CMA (May 15, 2010 9:15 AM)  Physical Exam  General:  General: Well developed, well nourished, NAD Musculoskeletal: Muscle strength 5/5 all ext Psychiatric: Mood and affect normal Neck: No JVD, no carotid bruits, no thyromegaly, no lymphadenopathy. Lungs:Clear bilaterally, no wheezes, rhonci, crackles CV: RRR no murmurs, gallops rubs Abdomen: soft, NT, ND, BS present Extremities: No edema, pulses 2+.    EKG  Procedure date:  05/15/2010  Findings:      Sinus rhythm. rate 73 bpm.   Impression & Recommendations:  Problem # 1:  CAD, AUTOLOGOUS BYPASS GRAFT (ICD-414.02) Stable. Continue ASA, lower to 81 mg per day. Will continue beta blocker. Crestor every other day. Lipids look great.   His updated medication list for this problem includes:    Metoprolol Succinate 50 Mg Xr24h-tab (Metoprolol succinate) .Marland Kitchen... Take one tablet by mouth daily    Aspirin 81 Mg Tbec (Aspirin) .Marland Kitchen... Take one tablet by mouth daily  Orders: EKG w/ Interpretation (93000)  Patient Instructions: 1)  Your physician recommends that you schedule a follow-up appointment in: 6 months with Dr. Clifton James. 2)  Your physician has recommended you make the following  change in your medication: DECREASE ASPIRIN to 81mg  by mouth daily and CHANGE CRESTOR to every other day.

## 2010-05-25 ENCOUNTER — Encounter: Payer: Self-pay | Admitting: Internal Medicine

## 2010-05-25 ENCOUNTER — Ambulatory Visit (INDEPENDENT_AMBULATORY_CARE_PROVIDER_SITE_OTHER): Payer: BC Managed Care – PPO | Admitting: Internal Medicine

## 2010-05-25 DIAGNOSIS — J019 Acute sinusitis, unspecified: Secondary | ICD-10-CM

## 2010-05-25 DIAGNOSIS — J209 Acute bronchitis, unspecified: Secondary | ICD-10-CM

## 2010-05-29 LAB — COMPREHENSIVE METABOLIC PANEL
ALT: 12 U/L (ref 0–53)
AST: 17 U/L (ref 0–37)
BUN: 11 mg/dL (ref 6–23)
CO2: 24 mEq/L (ref 19–32)
Calcium: 8.6 mg/dL (ref 8.4–10.5)
Creatinine, Ser: 1.01 mg/dL (ref 0.4–1.5)
Potassium: 3.7 mEq/L (ref 3.5–5.1)
Sodium: 137 mEq/L (ref 135–145)
Total Bilirubin: 0.5 mg/dL (ref 0.3–1.2)
Total Protein: 6.5 g/dL (ref 6.0–8.3)

## 2010-05-29 LAB — CBC
HCT: 25.3 % — ABNORMAL LOW (ref 39.0–52.0)
HCT: 42 % (ref 39.0–52.0)
HCT: 42.5 % (ref 39.0–52.0)
HCT: 43.3 % (ref 39.0–52.0)
HCT: 44 % (ref 39.0–52.0)
Hemoglobin: 14.5 g/dL (ref 13.0–17.0)
Hemoglobin: 14.6 g/dL (ref 13.0–17.0)
Hemoglobin: 14.9 g/dL (ref 13.0–17.0)
Hemoglobin: 8.1 g/dL — ABNORMAL LOW (ref 13.0–17.0)
Hemoglobin: 8.8 g/dL — ABNORMAL LOW (ref 13.0–17.0)
Hemoglobin: 9.7 g/dL — ABNORMAL LOW (ref 13.0–17.0)
MCH: 27.4 pg (ref 26.0–34.0)
MCH: 27.5 pg (ref 26.0–34.0)
MCH: 27.8 pg (ref 26.0–34.0)
MCH: 28.7 pg (ref 26.0–34.0)
MCH: 29 pg (ref 26.0–34.0)
MCHC: 32 g/dL (ref 30.0–36.0)
MCHC: 32 g/dL (ref 30.0–36.0)
MCHC: 33 g/dL (ref 30.0–36.0)
MCHC: 34.8 g/dL (ref 30.0–36.0)
MCV: 82.7 fL (ref 78.0–100.0)
MCV: 83.3 fL (ref 78.0–100.0)
MCV: 83.7 fL (ref 78.0–100.0)
MCV: 84 fL (ref 78.0–100.0)
MCV: 85.5 fL (ref 78.0–100.0)
Platelets: 113 10*3/uL — ABNORMAL LOW (ref 150–400)
Platelets: 191 10*3/uL (ref 150–400)
Platelets: 223 10*3/uL (ref 150–400)
Platelets: 235 10*3/uL (ref 150–400)
RBC: 3.35 MIL/uL — ABNORMAL LOW (ref 4.22–5.81)
RBC: 3.46 MIL/uL — ABNORMAL LOW (ref 4.22–5.81)
RBC: 5.08 MIL/uL (ref 4.22–5.81)
RBC: 5.24 MIL/uL (ref 4.22–5.81)
RBC: 5.28 MIL/uL (ref 4.22–5.81)
RDW: 14.3 % (ref 11.5–15.5)
RDW: 14.4 % (ref 11.5–15.5)
RDW: 14.6 % (ref 11.5–15.5)
WBC: 11.1 10*3/uL — ABNORMAL HIGH (ref 4.0–10.5)
WBC: 7.2 10*3/uL (ref 4.0–10.5)

## 2010-05-29 LAB — BASIC METABOLIC PANEL
BUN: 7 mg/dL (ref 6–23)
CO2: 23 mEq/L (ref 19–32)
CO2: 26 mEq/L (ref 19–32)
CO2: 28 mEq/L (ref 19–32)
CO2: 28 mEq/L (ref 19–32)
Calcium: 7.8 mg/dL — ABNORMAL LOW (ref 8.4–10.5)
Calcium: 8 mg/dL — ABNORMAL LOW (ref 8.4–10.5)
Calcium: 8.8 mg/dL (ref 8.4–10.5)
Calcium: 9.4 mg/dL (ref 8.4–10.5)
Chloride: 116 mEq/L — ABNORMAL HIGH (ref 96–112)
Creatinine, Ser: 1.03 mg/dL (ref 0.4–1.5)
Creatinine, Ser: 1.12 mg/dL (ref 0.4–1.5)
Creatinine, Ser: 1.19 mg/dL (ref 0.4–1.5)
GFR calc Af Amer: >60 mL/min (ref >60)
GFR calc Af Amer: >60 mL/min (ref >60)
GFR calc non Af Amer: 51 mL/min — ABNORMAL LOW (ref >60)
GFR calc non Af Amer: >60 mL/min (ref >60)
Glucose, Bld: 107 mg/dL — ABNORMAL HIGH (ref 70–99)
Glucose, Bld: 124 mg/dL — ABNORMAL HIGH (ref 70–99)
Glucose, Bld: 95 mg/dL (ref 70–99)
Potassium: 3.6 mEq/L (ref 3.5–5.1)
Potassium: 4.1 mEq/L (ref 3.5–5.1)
Potassium: 4.2 mEq/L (ref 3.5–5.1)
Sodium: 133 mEq/L — ABNORMAL LOW (ref 135–145)
Sodium: 135 mEq/L (ref 135–145)
Sodium: 138 mEq/L (ref 135–145)
Sodium: 138 mEq/L (ref 135–145)
Sodium: 142 mEq/L (ref 135–145)

## 2010-05-29 LAB — POCT I-STAT 4, (NA,K, GLUC, HGB,HCT)
Glucose, Bld: 104 mg/dL — ABNORMAL HIGH (ref 70–99)
Glucose, Bld: 96 mg/dL (ref 70–99)
Glucose, Bld: 97 mg/dL (ref 70–99)
HCT: 24 % — ABNORMAL LOW (ref 39.0–52.0)
HCT: 28 % — ABNORMAL LOW (ref 39.0–52.0)
HCT: 30 % — ABNORMAL LOW (ref 39.0–52.0)
HCT: 37 % — ABNORMAL LOW (ref 39.0–52.0)
HCT: 42 % (ref 39.0–52.0)
Hemoglobin: 10.2 g/dL — ABNORMAL LOW (ref 13.0–17.0)
Hemoglobin: 10.2 g/dL — ABNORMAL LOW (ref 13.0–17.0)
Hemoglobin: 14.3 g/dL (ref 13.0–17.0)
Hemoglobin: 9.5 g/dL — ABNORMAL LOW (ref 13.0–17.0)
Potassium: 3.9 mEq/L (ref 3.5–5.1)
Potassium: 4.1 mEq/L (ref 3.5–5.1)
Potassium: 4.4 mEq/L (ref 3.5–5.1)
Sodium: 137 mEq/L (ref 135–145)
Sodium: 138 mEq/L (ref 135–145)
Sodium: 141 mEq/L (ref 135–145)

## 2010-05-29 LAB — GLUCOSE, CAPILLARY
Glucose-Capillary: 102 mg/dL — ABNORMAL HIGH (ref 70–99)
Glucose-Capillary: 115 mg/dL — ABNORMAL HIGH (ref 70–99)
Glucose-Capillary: 116 mg/dL — ABNORMAL HIGH (ref 70–99)
Glucose-Capillary: 117 mg/dL — ABNORMAL HIGH (ref 70–99)
Glucose-Capillary: 128 mg/dL — ABNORMAL HIGH (ref 70–99)
Glucose-Capillary: 139 mg/dL — ABNORMAL HIGH (ref 70–99)
Glucose-Capillary: 140 mg/dL — ABNORMAL HIGH (ref 70–99)
Glucose-Capillary: 156 mg/dL — ABNORMAL HIGH (ref 70–99)
Glucose-Capillary: 92 mg/dL (ref 70–99)
Glucose-Capillary: 94 mg/dL (ref 70–99)
Glucose-Capillary: 98 mg/dL (ref 70–99)
Glucose-Capillary: 99 mg/dL (ref 70–99)

## 2010-05-29 LAB — POCT I-STAT, CHEM 8
Calcium, Ion: 1.25 mmol/L (ref 1.12–1.32)
Glucose, Bld: 131 mg/dL — ABNORMAL HIGH (ref 70–99)
HCT: 32 % — ABNORMAL LOW (ref 39.0–52.0)
Hemoglobin: 10.9 g/dL — ABNORMAL LOW (ref 13.0–17.0)
TCO2: 25 mmol/L (ref 0–100)

## 2010-05-29 LAB — CREATININE, SERUM
Creatinine, Ser: 1.42 mg/dL (ref 0.4–1.5)
GFR calc Af Amer: >60 mL/min (ref >60)
GFR calc non Af Amer: 51 mL/min — ABNORMAL LOW (ref >60)

## 2010-05-29 LAB — MAGNESIUM
Magnesium: 2.4 mg/dL (ref 1.5–2.5)
Magnesium: 2.6 mg/dL — ABNORMAL HIGH (ref 1.5–2.5)

## 2010-05-29 LAB — HEMOGLOBIN A1C: Mean Plasma Glucose: 137 mg/dL — ABNORMAL HIGH (ref <117)

## 2010-05-29 LAB — BLOOD GAS, ARTERIAL
FIO2: 0.21 %
O2 Saturation: 94.5 %
Patient temperature: 98.6
TCO2: 25.6 mmol/L (ref 0–100)

## 2010-05-29 LAB — POCT I-STAT 3, VENOUS BLOOD GAS (G3P V)
Acid-base deficit: 1 mmol/L (ref 0.0–2.0)
Bicarbonate: 23.4 mEq/L (ref 20.0–24.0)
TCO2: 25 mmol/L (ref 0–100)
pH, Ven: 7.386 — ABNORMAL HIGH (ref 7.250–7.300)

## 2010-05-29 LAB — POCT I-STAT 3, ART BLOOD GAS (G3+)
O2 Saturation: 91 %
O2 Saturation: 92 %
Patient temperature: 37.2
TCO2: 27 mmol/L (ref 0–100)
pCO2 arterial: 38.6 mmHg (ref 35.0–45.0)
pCO2 arterial: 47.6 mmHg — ABNORMAL HIGH (ref 35.0–45.0)
pH, Arterial: 7.371 (ref 7.350–7.450)
pO2, Arterial: 55 mmHg — ABNORMAL LOW (ref 80.0–100.0)

## 2010-05-29 LAB — HEPARIN LEVEL (UNFRACTIONATED)
Heparin Unfractionated: 0.32 IU/mL (ref 0.30–0.70)
Heparin Unfractionated: 0.76 IU/mL — ABNORMAL HIGH (ref 0.30–0.70)

## 2010-05-29 LAB — CARDIAC PANEL(CRET KIN+CKTOT+MB+TROPI)
Relative Index: 1.7 (ref 0.0–2.5)
Total CK: 103 U/L (ref 7–232)
Total CK: 115 U/L (ref 7–232)

## 2010-05-29 LAB — URINALYSIS, ROUTINE W REFLEX MICROSCOPIC
Hgb urine dipstick: NEGATIVE
Ketones, ur: NEGATIVE mg/dL
Protein, ur: NEGATIVE mg/dL
Specific Gravity, Urine: 1.019 (ref 1.005–1.030)
Urobilinogen, UA: 0.2 mg/dL (ref 0.0–1.0)
pH: 6 (ref 5.0–8.0)

## 2010-05-29 LAB — HEMOGLOBIN AND HEMATOCRIT, BLOOD: Hemoglobin: 9.7 g/dL — ABNORMAL LOW (ref 13.0–17.0)

## 2010-05-29 LAB — APTT: aPTT: 29 seconds (ref 24–37)

## 2010-05-29 LAB — PROTIME-INR
Prothrombin Time: 13.2 seconds (ref 11.6–15.2)
Prothrombin Time: 17.9 seconds — ABNORMAL HIGH (ref 11.6–15.2)

## 2010-05-29 LAB — TYPE AND SCREEN

## 2010-05-29 LAB — ABO/RH: ABO/RH(D): B POS

## 2010-06-02 NOTE — Assessment & Plan Note (Signed)
Summary: acute, cough, drainage/nta   Vital Signs:  Patient profile:   60 year old male Weight:      245 pounds BMI:     34.29 O2 Sat:      98 % on Room air Temp:     98.5 degrees F oral Pulse rate:   82 / minute Resp:     15 per minute BP sitting:   110 / 60  (left arm)  Vitals Entered By: Doristine Devoid CMA (May 25, 2010 12:25 PM)  O2 Flow:  Room air CC: cough and chest congestion along w/ sinus pressure jaw pain also has been using mucinex, URI symptoms   Primary Care Provider:  Laury Axon  CC:  cough and chest congestion along w/ sinus pressure jaw pain also has been using mucinex and URI symptoms.  History of Present Illness:    Onset 03/05 as rhinitis; he now reports purulent nasal discharge and productive cough, but denies sore throat.  The patient denies fever ( but has chills), dyspnea, and wheezing.  The patient also reports headache.  Risk factors for Strep sinusitis include unilateral facial pain & dental pain. Rx: Mucinex. Non smoker ; no asthma PMH..    Current Medications (verified): 1)  Metoprolol Succinate 50 Mg Xr24h-Tab (Metoprolol Succinate) .... Take One Tablet By Mouth Daily 2)  Aspirin 81 Mg Tbec (Aspirin) .... Take One Tablet By Mouth Daily 3)  Crestor 20 Mg Tabs (Rosuvastatin Calcium) .... Take One Tablet Every Other Day By Mouth. 4)  African Mango .... Three Times A Day  Allergies (verified): No Known Drug Allergies  Physical Exam  General:  well-nourished,in no acute distress; alert,appropriate and cooperative throughout examination Ears:  External ear exam shows no significant lesions or deformities.  Otoscopic examination reveals clear canals, tympanic membranes are intact bilaterally without bulging, retraction, inflammation or discharge. Hearing is grossly normal bilaterally. Nose:  External nasal examination shows no deformity or inflammation. Nasal mucosa are dry without lesions or exudates. Mouth:  Oral mucosa and oropharynx without lesions or  exudates.  Teeth in good repair. Lungs:  Normal respiratory effort, chest expands symmetrically. Lungs are clear to auscultation; minor crackles w/o  wheezes. Heart:  Normal rate and regular rhythm. Split  S2 without gallop, murmur, click, rub or other extra sounds. Cervical Nodes:  No lymphadenopathy noted Axillary Nodes:  No palpable lymphadenopathy   Impression & Recommendations:  Problem # 1:  SINUSITIS- ACUTE-NOS (ICD-461.9)  His updated medication list for this problem includes:    Amoxicillin 500 Mg Caps (Amoxicillin) .Marland Kitchen... 1 three times a day    Hydromet 5-1.5 Mg/50ml Syrp (Hydrocodone-homatropine) .Marland Kitchen... 1 tsp every 6 hrs as needed  Problem # 2:  BRONCHITIS-ACUTE (ICD-466.0)  His updated medication list for this problem includes:    Amoxicillin 500 Mg Caps (Amoxicillin) .Marland Kitchen... 1 three times a day    Hydromet 5-1.5 Mg/63ml Syrp (Hydrocodone-homatropine) .Marland Kitchen... 1 tsp every 6 hrs as needed  Complete Medication List: 1)  Metoprolol Succinate 50 Mg Xr24h-tab (Metoprolol succinate) .... Take one tablet by mouth daily 2)  Aspirin 81 Mg Tbec (Aspirin) .... Take one tablet by mouth daily 3)  Crestor 20 Mg Tabs (Rosuvastatin calcium) .... Take one tablet every other day by mouth. 4)  African Mango  .... Three times a day 5)  Amoxicillin 500 Mg Caps (Amoxicillin) .Marland Kitchen.. 1 three times a day 6)  Hydromet 5-1.5 Mg/41ml Syrp (Hydrocodone-homatropine) .Marland Kitchen.. 1 tsp every 6 hrs as needed  Patient Instructions: 1)  Neti pot once  daily as needed . 2)  Drink as much NON dairy  fluid as you can tolerate for the next few days. Prescriptions: HYDROMET 5-1.5 MG/5ML SYRP (HYDROCODONE-HOMATROPINE) 1 tsp every 6 hrs as needed  #120cc x 0   Entered and Authorized by:   Marga Melnick MD   Signed by:   Marga Melnick MD on 05/25/2010   Method used:   Printed then [facility]ed [facility] ...       [facility]  [address] [id number]       [address]       [place], [place]  [address]       Ph:  [phone]       Fax: [phone]   RxID:   [id number] AMOXICILLIN 500 MG CAPS (AMOXICILLIN) 1 three times a day  #30 x 0   Entered and Authorized by:   Marga Melnick MD   Signed by:   Marga Melnick MD on 05/25/2010   Method used:   Print then Give to Patient   RxID:   [id number]    Orders Added: 1)  Est. Patient Level III [36144]

## 2010-07-06 ENCOUNTER — Other Ambulatory Visit: Payer: Self-pay

## 2010-07-06 MED ORDER — METOPROLOL SUCCINATE ER 50 MG PO TB24: 50.0000 mg | ORAL_TABLET | Freq: Every day | ORAL | Status: DC

## 2010-07-28 NOTE — Assessment & Plan Note (Signed)
OFFICE VISIT   TAN, CLOPPER  DOB:  12-08-1950                                        November 14, 2009  CHART #:  [id number]   HISTORY OF PRESENT ILLNESS:  The patient returns to the office today for  routine followup status post coronary artery bypass grafting x5 on  October 17, 2009.  His postoperative recovery has been uncomplicated.  Since hospital discharge, he has continued to improve.  He was seen in  follow up by Dr. Juanda Chance recently and he returns to our office for  routine followup today.  He has signed up for the cardiac rehab program,  but he has not started this yet because he has had some delays getting  his insurance claims processed.  The patient reports feeling quite well.  He has mild residual soreness in his chest.  His appetite is good.  He  is sleeping well at night.  He has no shortness of breath.  Overall, he  has no complaints.  Medications remain unchanged from the time of  hospital discharge.  They include aspirin, a beta-blocker, and a statin.   PHYSICAL EXAMINATION:  Notable for a well-appearing male with blood  pressure 118/78, pulse 98 and regular, oxygen saturation 97% on room  air.  Examination of the chest demonstrates a median sternotomy scar  that is healing nicely.  The sternum is stable on palpation.  Breath  sounds are clear to auscultation and symmetrical bilaterally.  No  wheezes, rales, or rhonchi are noted.  Cardiovascular exam includes  regular rate and rhythm.  No murmurs, rubs, or gallops are appreciated.  The abdomen is soft, nontender.  The extremities are warm and well  perfused.  The small incisions just below the knee on both sides from  endoscopic vein harvest have healed nicely.  There is no lower extremity  edema.  The remainder of his physical exam is unremarkable.   DIAGNOSTIC TESTS:  Chest x-ray performed today at the [facility] is reviewed.  This demonstrates clear lung fields  bilaterally.  There are no pleural effusions.  All the sternal wires appear intact.   IMPRESSION:  Excellent progress following recent coronary artery bypass  grafting.   PLAN:  I have encouraged the patient to continue to gradually increase  his physical activity as tolerated with his only limitation at this  point remaining that he refrain from heavy lifting or strenuous use of  his arms or shoulders for at least another 2 or 3 months.  I think he  can go back to driving an automobile.  I have encouraged him to get  involved in the cardiac rehab program.  All of his questions have been  addressed.  In the future, he will call and return to see [place] as needed.   Salvatore Decent. Cornelius Moras, M.D.  Electronically Signed   CHO/MEDQ  D:  11/14/2009  T:  11/14/2009  Job:  [id number]   cc:   Everardo Beals. Juanda Chance, MD, [facility]  Lelon Perla, DO  Verne Carrow, MD

## 2010-07-30 ENCOUNTER — Encounter: Payer: Self-pay | Admitting: Family Medicine

## 2010-07-30 ENCOUNTER — Ambulatory Visit (INDEPENDENT_AMBULATORY_CARE_PROVIDER_SITE_OTHER): Payer: BC Managed Care – PPO | Admitting: Family Medicine

## 2010-07-30 VITALS — BP 128/70 | HR 80 | Temp 97.5°F | Resp 16 | Wt 239.6 lb

## 2010-07-30 DIAGNOSIS — M25512 Pain in left shoulder: Secondary | ICD-10-CM

## 2010-07-30 DIAGNOSIS — J40 Bronchitis, not specified as acute or chronic: Secondary | ICD-10-CM

## 2010-07-30 DIAGNOSIS — M25519 Pain in unspecified shoulder: Secondary | ICD-10-CM

## 2010-07-30 MED ORDER — AZITHROMYCIN 500 MG PO TABS: ORAL_TABLET | ORAL | Status: AC

## 2010-07-30 MED ORDER — PROMETHAZINE-CODEINE 6.25-10 MG/5ML PO SYRP: 5.0000 mL | ORAL_SOLUTION | Freq: Four times a day (QID) | ORAL | Status: AC | PRN

## 2010-07-30 NOTE — Patient Instructions (Signed)
Bronchitis Bronchitis is the body's way of reacting to injury and/or infection (inflammation) of the bronchi. Bronchi are the air tubes that extend from the windpipe into the lungs. If the inflammation becomes severe, it may cause shortness of breath.  CAUSES Inflammation may be caused by:  A virus.   Germs (bacteria).   Dust.   Allergens.   Pollutants and many other irritants.  The cells lining the bronchial tree are covered with tiny hairs (cilia). These constantly beat upward, away from the lungs, toward the mouth. This keeps the lungs free of pollutants. When these cells become too irritated and are unable to do their job, mucus begins to develop. This causes the characteristic cough of bronchitis. The cough clears the lungs when the cilia are unable to do their job. Without either of these protective mechanisms, the mucus would settle in the lungs. Then you would develop pneumonia. Smoking is a common cause of bronchitis and can contribute to pneumonia. Stopping this habit is the single most important thing you can do to help yourself. TREATMENT  Your caregiver may prescribe an antibiotic if the cough is caused by bacteria. Also, medicines that open up your airways make it easier to breathe. Your caregiver may also recommend or prescribe an expectorant. It will loosen the mucus to be coughed up. Only take over-the-counter or prescription medicines for pain, discomfort, or fever as directed by your caregiver.   Removing whatever causes the problem (smoking, for example) is critical to preventing the problem from getting worse.   Cough suppressants may be prescribed for relief of cough symptoms.   Inhaled medicines may be prescribed to help with symptoms now and to help prevent problems from returning.   For those with recurrent (chronic) bronchitis, there may be a need for steroid medicines.  SEEK IMMEDIATE MEDICAL CARE IF:  During treatment, you develop more pus-like mucus  (purulent sputum).   You or your child has an oral temperature above 100.4, not controlled by medicine.   Your baby is older than 3 months with a rectal temperature of 102 F (38.9 C) or higher.   Your baby is 3 months old or younger with a rectal temperature of 100.4 F (38 C) or higher.   You become progressively more ill.   You have increased difficulty breathing, wheezing, or shortness of breath.  It is necessary to seek immediate medical care if you are elderly or sick from any other disease. MAKE SURE YOU:  Understand these instructions.   Will watch your condition.   Will get help right away if you are not doing well or get worse.  Document Released: 03/01/2005 Document Re-Released: 05/26/2009 [employer] Patient Information 2011 [employer]. 

## 2010-07-30 NOTE — Progress Notes (Signed)
  Subjective:     Devon Brady is a 60 y.o. male here for evaluation of a cough. Onset of symptoms was 4 months ago. Symptoms have been unchanged since that time. The cough is productive and is aggravated by  heat. Associated symptoms include: change in voice, postnasal drip and sputum production. Patient does not have a history of asthma. Patient does not have a history of environmental allergens. Patient has not traveled recently. Patient does not have a history of smoking. Patient has not had a previous chest x-ray. Patient has not had a PPD done.  The following portions of the patient's history were reviewed and updated as appropriate: allergies, current medications, past family history, past medical history, past social history, past surgical history and problem list.  Review of Systems Pertinent items are noted in HPI.    Objective:    Oxygen saturation 98% on room air BP 128/70  Pulse 80  Temp(Src) 97.5 F (36.4 C) (Oral)  Resp 16  Wt 239 lb 9.6 oz (108.682 kg)  SpO2 98% General appearance: alert, cooperative, appears stated age and no distress Ears: normal TM's and external ear canals both ears Nose: Nares normal. Septum midline. Mucosa normal. No drainage or sinus tenderness. Throat: abnormal findings: + errythema and pnd Neck: no adenopathy, supple, symmetrical, trachea midline and thyroid not enlarged, symmetric, no tenderness/mass/nodules Lungs: diminished breath sounds bilaterally Heart: regular rate and rhythm, S1, S2 normal, no murmur, click, rub or gallop  Ext-- pain with rotation both shoulders,  No swelling ,  errythema  Assessment:    Acute Bronchitis   b/l shoulder pain Plan:  Refer ortho  Antibiotics per medication orders. Antitussives per medication orders. Avoid exposure to tobacco smoke and fumes. Call if shortness of breath worsens, blood in sputum, change in character of cough, development of fever or chills, inability to maintain nutrition and  hydration. Avoid exposure to tobacco smoke and fumes. Chest x-ray. f/u prn

## 2010-07-31 NOTE — Op Note (Signed)
NAME:  Devon Brady, Devon Brady                     ACCOUNT NO.:  [id number]   MEDICAL RECORD NO.:  [id number]                   PATIENT TYPE:  AMB   LOCATION:  ENDO                                 FACILITY:  [facility]   PHYSICIAN:  Petra Kuba, M.D.                 DATE OF BIRTH:  02-22-1951   DATE OF PROCEDURE:  09/03/2003  DATE OF DISCHARGE:                                 OPERATIVE REPORT   __________.   PROCEDURE:  Colonoscopy.   INDICATIONS:  Patient with history of colon polyps.  Due for repeat  screening.  Consent was signed after risks, benefits, methods, and options  were thoroughly discussed in the office on multiple occasions.   PREMEDICATIONS:  Demerol 70 mg, Versed 7 mg.   DESCRIPTION OF PROCEDURE:  Rectal inspection pertinent for external  hemorrhoids, small.  Digital exam was negative.  Video colonoscope was  inserted and easily advanced around the colon to the cecum.  This did not  require any position changes.  Upon insertion in the proximal transverse  colon at the hepatic flexure, a small polyp was seen but no other  abnormalities.  Cecum was identified by the appendiceal orifice and the  ileocecal valve.  There was a tiny cecal polyp which was carefully hot  biopsied x1 and put in the first container.  The scope was slowly withdrawn.  Prep was adequate.  There was some liquid stool that required washing and  suctioning.  The scope was withdrawn back to the polyp seen on insertion  which was snared, but unfortunately before we could get the cautery, the  snare cut through the polyp.  We were taken the container to see if it was  suctioned through the scope and collected in the container.  It did  not  appear in the trap with lots of washing and suctioning.  We did hot biopsy  the base one time and put it in the same container with the cecal polyp.  It  was found at the end of the procedure and put in the first container.  The  scope was then slowly withdrawn.  Two  tiny descending polyps were seen and  were hot biopsied x1 and put in the second container.  Also a tiny rectal  polyp was seen and was hot biopsied x1 and put in the second container as  well.  No other abnormalities were seen as we slowly withdrew back to the  rectum.  Anorectal pull-through and retroflexion confirmed some small  hemorrhoids.  The scope was reinserted and actually readvanced all the way  around the colon to the cecum.  Air and water were suctioned  and the scope  removed.  The patient tolerated the procedure well.  There were no obvious  immediate complications.   ENDOSCOPIC DIAGNOSES:  1. Internal and external hemorrhoids.  2. One tiny rectal and two descending polyps hot biopsied and put in the  second container.  3. Hepatic flexure snare and hot biopsy in the base, small.  4. Cecal tiny polyp hot.  5. Otherwise within normal limits to the cecum.   PLAN:  Await pathology to determine colonic screening.  Happy to see back  p.r.n.  Otherwise return care to [facility] for the customary health care  maintenance to include yearly rectals and guaiacs.                                              Petra Kuba, M.D.   MEM/MEDQ  D:  09/03/2003  T:  09/03/2003  Job:  [id number]

## 2010-09-02 ENCOUNTER — Telehealth: Payer: Self-pay | Admitting: Cardiovascular Disease

## 2010-09-02 NOTE — Telephone Encounter (Signed)
PT CALLED TO SET UP AUGUST recall and wants to know if he needs lab ao

## 2010-10-05 ENCOUNTER — Other Ambulatory Visit: Payer: Self-pay | Admitting: Family Medicine

## 2010-10-12 ENCOUNTER — Encounter: Payer: Self-pay | Admitting: Cardiovascular Disease

## 2010-10-13 ENCOUNTER — Ambulatory Visit (INDEPENDENT_AMBULATORY_CARE_PROVIDER_SITE_OTHER): Payer: BC Managed Care – PPO | Admitting: Cardiovascular Disease

## 2010-10-13 ENCOUNTER — Encounter: Payer: Self-pay | Admitting: Cardiovascular Disease

## 2010-10-13 VITALS — BP 118/78 | HR 81 | Resp 14 | Ht 70.0 in | Wt 234.0 lb

## 2010-10-13 DIAGNOSIS — N529 Male erectile dysfunction, unspecified: Secondary | ICD-10-CM | POA: Insufficient documentation

## 2010-10-13 DIAGNOSIS — E785 Hyperlipidemia, unspecified: Secondary | ICD-10-CM

## 2010-10-13 DIAGNOSIS — I2510 Atherosclerotic heart disease of native coronary artery without angina pectoris: Secondary | ICD-10-CM | POA: Insufficient documentation

## 2010-10-13 DIAGNOSIS — F329 Major depressive disorder, single episode, unspecified: Secondary | ICD-10-CM

## 2010-10-13 LAB — LIPID PANEL
Cholesterol: 122 mg/dL (ref 0–200)
HDL: 29.1 mg/dL — ABNORMAL LOW (ref >39.00)
VLDL: 8.6 mg/dL (ref 0.0–40.0)

## 2010-10-13 LAB — HEPATIC FUNCTION PANEL
ALT: 12 U/L (ref 0–53)
AST: 17 U/L (ref 0–37)
Bilirubin, Direct: 0.1 mg/dL (ref 0.0–0.3)
Total Protein: 8.3 g/dL (ref 6.0–8.3)

## 2010-10-13 MED ORDER — ROSUVASTATIN CALCIUM 20 MG PO TABS: 20.0000 mg | ORAL_TABLET | ORAL | Status: DC

## 2010-10-13 MED ORDER — CITALOPRAM HYDROBROMIDE 20 MG PO TABS: 20.0000 mg | ORAL_TABLET | Freq: Every day | ORAL | Status: DC

## 2010-10-13 NOTE — Patient Instructions (Signed)
Your physician recommends that you schedule a follow-up appointment in: 1 year  Your physician has recommended you make the following change in your medication: START CELEXA 20 mg daily. Please follow up with your primary care doctor regarding your depression.

## 2010-10-13 NOTE — Assessment & Plan Note (Signed)
Will start Celexa 20 mg po Qdaily. He will f/u Dr. Laury Axon.

## 2010-10-13 NOTE — Assessment & Plan Note (Signed)
I have discussed starting Viagra prn but he refuses at this time. I have told him to let us know if he wishes to try Viagra and we could call into the pharmacy.

## 2010-10-13 NOTE — Progress Notes (Signed)
History of Present Illness:59 yo AAM with history of CAD s/p CABG August 2011, HTN, hyperlipidemia who is here today for cardiac follow up. He has been followed in the past by Dr. Juanda Chance. No exertional chest pains or SOB. No palpitations. He has had a cough for 4 months with clear mucus. No fever or chills. He has been having problems with maintaining an erection. He also notes lack of desire to do things, depression.  His primary care physician is Dr. Laury Axon.   Past Medical History  Diagnosis Date  . Hypertension   . Hyperlipidemia   . CAD (coronary artery disease)     s/p 5V CABG 8/11  . RCA occlusion     Past Surgical History  Procedure Date  . Vasectomy   . Bunionectomy   . Tonsillectomy   . Coronary artery bypass graft 10/17/2009    LIMA to LAD,SVG to Ramus,SVG to OM sequential to OM2, SVG to marginal of RCA  . Cardiac catheterization 10/15/2009    Current Outpatient Prescriptions  Medication Sig Dispense Refill  . aspirin 81 MG tablet Take 81 mg by mouth daily.        . metoprolol (TOPROL-XL) 50 MG 24 hr tablet TAKE 1 TABLET EVERY DAY  30 tablet  0  . rosuvastatin (CRESTOR) 20 MG tablet Take 20 mg by mouth every other day.          No Known Allergies  History   Social History  . Marital Status: Married    Spouse Name: N/A    Number of Children: N/A  . Years of Education: N/A   Occupational History  . Not on file.   Social History Main Topics  . Smoking status: Never Smoker   . Smokeless tobacco: Never Used  . Alcohol Use: Yes  . Drug Use: No  . Sexually Active: Not on file   Other Topics Concern  . Not on file   Social History Narrative  . No narrative on file    Family History  Problem Relation Age of Onset  . Alcohol abuse    . Arthritis    . Diabetes Father   . Coronary artery disease Father 80    dscd  . Kidney disease    . Prostate cancer    . Hypertension Mother   . Hyperlipidemia      Review of Systems:  As stated in the HPI and otherwise  negative.   BP 118/78  Pulse 81  Resp 14  Ht 5\' 10"  (1.778 m)  Wt 234 lb (106.142 kg)  BMI 33.58 kg/m2  Physical Examination: General: Well developed, well nourished, NAD HEENT: OP clear, mucus membranes moist SKIN: warm, dry. No rashes. Neuro: No focal deficits Musculoskeletal: Muscle strength 5/5 all ext Psychiatric: Mood and affect normal Neck: No JVD, no carotid bruits, no thyromegaly, no lymphadenopathy. Lungs:Clear bilaterally, no wheezes, rhonci, crackles Cardiovascular: Regular rate and rhythm. No murmurs, gallops or rubs. Abdomen:Soft. Bowel sounds present. Non-tender.  Extremities: No lower extremity edema. Pulses are 2 + in the bilateral DP/PT.

## 2010-10-13 NOTE — Assessment & Plan Note (Addendum)
Stable. Continue ASA, beta blocker and statin. Will check Lipids and LFTs today.

## 2010-10-15 ENCOUNTER — Ambulatory Visit: Payer: BC Managed Care – PPO | Admitting: Cardiovascular Disease

## 2010-11-02 ENCOUNTER — Encounter: Payer: Self-pay | Admitting: Family Medicine

## 2010-11-02 ENCOUNTER — Ambulatory Visit (INDEPENDENT_AMBULATORY_CARE_PROVIDER_SITE_OTHER): Payer: BC Managed Care – PPO | Admitting: Family Medicine

## 2010-11-02 VITALS — BP 124/82 | HR 88 | Temp 98.8°F | Wt 235.0 lb

## 2010-11-02 DIAGNOSIS — D649 Anemia, unspecified: Secondary | ICD-10-CM

## 2010-11-02 DIAGNOSIS — F329 Major depressive disorder, single episode, unspecified: Secondary | ICD-10-CM

## 2010-11-02 DIAGNOSIS — K625 Hemorrhage of anus and rectum: Secondary | ICD-10-CM

## 2010-11-02 DIAGNOSIS — R05 Cough: Secondary | ICD-10-CM

## 2010-11-02 MED ORDER — CETIRIZINE HCL 10 MG PO CHEW: 10.0000 mg | CHEWABLE_TABLET | Freq: Every day | ORAL | Status: DC

## 2010-11-02 MED ORDER — OMEPRAZOLE MAGNESIUM 20 MG PO TBEC: 20.0000 mg | DELAYED_RELEASE_TABLET | Freq: Every day | ORAL | Status: DC

## 2010-11-02 NOTE — Patient Instructions (Signed)
Take otc antihistamine---ex  Zyrtec, claritin etc daily Take prilosec otc daily Get the xray done--- if no relief I will refer you to pulmonologist

## 2010-11-03 ENCOUNTER — Encounter: Payer: Self-pay | Admitting: Family Medicine

## 2010-11-03 ENCOUNTER — Ambulatory Visit (INDEPENDENT_AMBULATORY_CARE_PROVIDER_SITE_OTHER)
Admission: RE | Admit: 2010-11-03 | Discharge: 2010-11-03 | Disposition: A | Discharge status: Home / Self Care | Payer: BC Managed Care – PPO | Source: Ambulatory Visit | Attending: Family Medicine | Admitting: Family Medicine

## 2010-11-03 DIAGNOSIS — R05 Cough: Secondary | ICD-10-CM

## 2010-11-03 LAB — CBC WITH DIFFERENTIAL/PLATELET
Basophils Absolute: 0 10*3/uL (ref 0.0–0.1)
Basophils Relative: 0.3 % (ref 0.0–3.0)
Eosinophils Absolute: 0.1 10*3/uL (ref 0.0–0.7)
Lymphocytes Relative: 33.8 % (ref 12.0–46.0)
MCHC: 31.9 g/dL (ref 30.0–36.0)
Monocytes Absolute: 0.4 10*3/uL (ref 0.1–1.0)
Neutrophils Relative %: 55.6 % (ref 43.0–77.0)
Platelets: 408 10*3/uL — ABNORMAL HIGH (ref 150.0–400.0)
RBC: 4.64 Mil/uL (ref 4.22–5.81)

## 2010-11-03 NOTE — Progress Notes (Signed)
  Subjective:    Patient ID: Devon Brady, male    DOB: Mar 10, 1951, 60 y.o.   MRN: [id number]  HPI Pt here c/o dry cough since February.  He was treated in May for Bronchitis but pt states cough never completely went away.  No fever, no sinus congestion.  + sore throat and and burping/ Gerd symptoms.    No chest pain  Pt would also like to discuss depression.   Apparenty pt's wife went with him to cardiology appointment and told Dr he was depressed so he was started on celexa.  Pt doesn't think he is depressed but his wife had concerns.  At the end of the visit pt also stated he was told , when he tried to donate blood that his blood level was low and he has noticed blood per recturm.     Review of Systems As above    Objective:   Physical Exam  Constitutional: He is oriented to person, place, and time. He appears well-developed and well-nourished.  HENT:  Right Ear: External ear normal.  Left Ear: External ear normal.  Nose: Nose normal.       + PND  Neck: Normal range of motion. Neck supple.  Cardiovascular: Normal rate and normal heart sounds.   No murmur heard. Pulmonary/Chest: Effort normal and breath sounds normal. No respiratory distress. He has no wheezes. He has no rales. He exhibits no tenderness.  Abdominal: Soft. He exhibits no distension and no mass. There is no tenderness. There is no rebound and no guarding.  Lymphadenopathy:    He has no cervical adenopathy.  Neurological: He is alert and oriented to person, place, and time.  Psychiatric: He has a normal mood and affect. His behavior is normal. Thought content normal.          Assessment & Plan:  1. Allergies vs Gerd  prilosec otc daily otc antihistamine cxr Refer to pulm if no relief  2.  Depression-- start celexa as rx by cardio       Check testoserone level with next lab draw  3.rectal bleeding Refer to GI---pt has seen Dr Ewing Schlein in past

## 2010-11-12 ENCOUNTER — Other Ambulatory Visit: Payer: BC Managed Care – PPO

## 2010-11-12 ENCOUNTER — Encounter: Payer: Self-pay | Admitting: *Deleted

## 2010-11-12 ENCOUNTER — Other Ambulatory Visit: Payer: Self-pay | Admitting: Family Medicine

## 2010-11-12 DIAGNOSIS — D649 Anemia, unspecified: Secondary | ICD-10-CM

## 2010-11-12 DIAGNOSIS — K625 Hemorrhage of anus and rectum: Secondary | ICD-10-CM

## 2010-11-12 LAB — FECAL OCCULT BLOOD, IMMUNOCHEMICAL: Fecal Occult Bld: NEGATIVE

## 2010-11-13 ENCOUNTER — Other Ambulatory Visit: Payer: Self-pay | Admitting: Family Medicine

## 2010-12-05 ENCOUNTER — Encounter: Payer: Self-pay | Admitting: Family Medicine

## 2010-12-05 ENCOUNTER — Ambulatory Visit (INDEPENDENT_AMBULATORY_CARE_PROVIDER_SITE_OTHER): Payer: BC Managed Care – PPO | Admitting: Family Medicine

## 2010-12-05 VITALS — BP 102/68 | Temp 99.2°F | Wt 229.5 lb

## 2010-12-05 DIAGNOSIS — J40 Bronchitis, not specified as acute or chronic: Secondary | ICD-10-CM

## 2010-12-05 MED ORDER — AZITHROMYCIN 250 MG PO TABS: ORAL_TABLET | ORAL | Status: AC

## 2010-12-05 NOTE — Progress Notes (Signed)
  Subjective:    Patient ID: Devon Brady, male    DOB: 26-Mar-1950, 60 y.o.   MRN: [id number]  HPI Here for 3 days of chest congestion, chills, and coughing up green sputum. Using Robitussin.   Review of Systems  Constitutional: Positive for chills. Negative for fever.  HENT: Negative.   Eyes: Negative.   Respiratory: Positive for cough. Negative for choking, shortness of breath and wheezing.        Objective:   Physical Exam  Constitutional: He appears well-developed and well-nourished.  HENT:  Right Ear: External ear normal.  Left Ear: External ear normal.  Nose: Nose normal.  Mouth/Throat: Oropharynx is clear and moist. No oropharyngeal exudate.  Eyes: Conjunctivae are normal. Pupils are equal, round, and reactive to light.  Neck: Neck supple. No thyromegaly present.  Pulmonary/Chest: Effort normal. No respiratory distress. He has no wheezes. He has no rales. He exhibits no tenderness.       Scattered rhonchi   Lymphadenopathy:    He has no cervical adenopathy.          Assessment & Plan:  Recheck prn

## 2010-12-09 ENCOUNTER — Other Ambulatory Visit: Payer: Self-pay | Admitting: Gastroenterology

## 2010-12-21 ENCOUNTER — Other Ambulatory Visit: Payer: Self-pay | Admitting: Family Medicine

## 2011-01-01 ENCOUNTER — Encounter: Payer: Self-pay | Admitting: Family Medicine

## 2011-01-19 ENCOUNTER — Other Ambulatory Visit: Payer: Self-pay | Admitting: Family Medicine

## 2011-03-14 ENCOUNTER — Other Ambulatory Visit: Payer: Self-pay | Admitting: Family Medicine

## 2011-03-16 DIAGNOSIS — Z9889 Other specified postprocedural states: Secondary | ICD-10-CM

## 2011-03-16 HISTORY — DX: Other specified postprocedural states: Z98.890

## 2011-03-17 ENCOUNTER — Telehealth: Payer: Self-pay | Admitting: Cardiovascular Disease

## 2011-03-17 NOTE — Telephone Encounter (Signed)
LOV x2,Stress,Cath,Echo,12 lead faxed to [facility] [place]  At [phone]  03/17/11/KM

## 2011-05-12 ENCOUNTER — Telehealth: Payer: Self-pay | Admitting: Oncology

## 2011-05-12 NOTE — Telephone Encounter (Signed)
S/w the pt and he is aware of his new pt appt in march

## 2011-05-14 ENCOUNTER — Telehealth: Payer: Self-pay | Admitting: Oncology

## 2011-05-14 NOTE — Telephone Encounter (Signed)
Del. Ref. Dr.Fernanda Ludwig Clarks Dx. Anemia,Leukocytosis

## 2011-05-19 ENCOUNTER — Other Ambulatory Visit: Payer: Self-pay | Admitting: Family Medicine

## 2011-05-20 ENCOUNTER — Ambulatory Visit: Payer: BC Managed Care – PPO | Admitting: Oncology

## 2011-05-20 ENCOUNTER — Other Ambulatory Visit: Payer: BC Managed Care – PPO

## 2011-05-20 ENCOUNTER — Ambulatory Visit: Payer: BC Managed Care – PPO

## 2011-05-24 ENCOUNTER — Other Ambulatory Visit: Payer: Self-pay | Admitting: Nephrology

## 2011-05-26 ENCOUNTER — Ambulatory Visit
Admission: RE | Admit: 2011-05-26 | Discharge: 2011-05-26 | Disposition: A | Discharge status: Home / Self Care | Payer: BC Managed Care – PPO | Source: Ambulatory Visit | Attending: Nephrology | Admitting: Nephrology

## 2011-05-27 ENCOUNTER — Other Ambulatory Visit: Payer: BC Managed Care – PPO

## 2011-05-27 ENCOUNTER — Encounter: Payer: Self-pay | Admitting: Oncology

## 2011-05-27 ENCOUNTER — Other Ambulatory Visit (HOSPITAL_BASED_OUTPATIENT_CLINIC_OR_DEPARTMENT_OTHER): Payer: BC Managed Care – PPO | Admitting: Lab

## 2011-05-27 ENCOUNTER — Ambulatory Visit (HOSPITAL_BASED_OUTPATIENT_CLINIC_OR_DEPARTMENT_OTHER): Payer: BC Managed Care – PPO | Admitting: Oncology

## 2011-05-27 ENCOUNTER — Telehealth: Payer: Self-pay | Admitting: Oncology

## 2011-05-27 ENCOUNTER — Ambulatory Visit: Payer: BC Managed Care – PPO

## 2011-05-27 VITALS — BP 120/85 | HR 86 | Temp 96.9°F | Ht 70.0 in | Wt 227.7 lb

## 2011-05-27 DIAGNOSIS — R599 Enlarged lymph nodes, unspecified: Secondary | ICD-10-CM

## 2011-05-27 DIAGNOSIS — E042 Nontoxic multinodular goiter: Secondary | ICD-10-CM

## 2011-05-27 DIAGNOSIS — C73 Malignant neoplasm of thyroid gland: Secondary | ICD-10-CM | POA: Insufficient documentation

## 2011-05-27 DIAGNOSIS — D72829 Elevated white blood cell count, unspecified: Secondary | ICD-10-CM

## 2011-05-27 DIAGNOSIS — N183 Chronic kidney disease, stage 3 unspecified: Secondary | ICD-10-CM | POA: Insufficient documentation

## 2011-05-27 DIAGNOSIS — D649 Anemia, unspecified: Secondary | ICD-10-CM

## 2011-05-27 DIAGNOSIS — D72820 Lymphocytosis (symptomatic): Secondary | ICD-10-CM | POA: Insufficient documentation

## 2011-05-27 DIAGNOSIS — N289 Disorder of kidney and ureter, unspecified: Secondary | ICD-10-CM

## 2011-05-27 DIAGNOSIS — R591 Generalized enlarged lymph nodes: Secondary | ICD-10-CM

## 2011-05-27 DIAGNOSIS — N1330 Unspecified hydronephrosis: Secondary | ICD-10-CM | POA: Insufficient documentation

## 2011-05-27 LAB — CBC & DIFF AND RETIC
Basophils Absolute: 0 10*3/uL (ref 0.0–0.1)
EOS%: 2.5 % (ref 0.0–7.0)
LYMPH%: 15.2 % (ref 14.0–49.0)
MCH: 22.5 pg — ABNORMAL LOW (ref 27.2–33.4)
MCV: 73.1 fL — ABNORMAL LOW (ref 79.3–98.0)
MONO%: 4.5 % (ref 0.0–14.0)
Platelets: 347 10*3/uL (ref 140–400)
RBC: 4.79 10*6/uL (ref 4.20–5.82)
RDW: 17.7 % — ABNORMAL HIGH (ref 11.0–14.6)
Retic %: 1.2 % (ref 0.80–1.80)
Retic Ct Abs: 57.48 10*3/uL (ref 34.80–93.90)

## 2011-05-27 LAB — MORPHOLOGY: PLT EST: ADEQUATE

## 2011-05-27 NOTE — Progress Notes (Signed)
Patient came in today as a new patient,he has one insurance,so I told him about our financial assistance program we have here at [facility],he said sure I take the application home and fill it out then mail it back in to me.

## 2011-05-27 NOTE — Telephone Encounter (Signed)
Gave pt appt calendar for March 2013 , he will see Dr. Okey Dupre on 06/07/11 1330pm, filled out referral for HIM so they can fax records to Dr. Pollyann Kennedy, Ct will be done 3/15 ok per Bonita Quin.

## 2011-05-27 NOTE — Progress Notes (Signed)
[facility] INITIAL HEMATOLOGY CONSULTATION  Referral MD:  Henri Medal, MD, MD  Reason for Referral:  Leukocytosis and anemia.     HPI:  Devon Brady is a 61 yo man with history of HTN, HLP, renal insufficiency with baseline Cr of 1.4; benign thyroid nodules.  He was in USOH until about a few months ago when he developed nonproductive cough.  This cough has no exacerbating or alleviating factors.  He denies SOB, DOE, hemoptysis.  He also noticed right > left inguinal swelling.  He also was noted to have neck swelling.  US neck on 05/21/11 showed bilateral thyroid nodules; the largest one was 4.9cm in the left lobe.  He was referred for renal US due to renal insuffiencey.  On 05/26/2011, renal US showed bilateral hydronephrosis R/L with possible pelvic and retroperitoneal adenopathy.  He was evaluated by PCP.  On 04/30/2011, WBC 14.2; Hgb 11.3; Plt 479.  Given his leukocytosis, he was kindly referred for evaluation.  Devon Brady is here with his wife for the first time. He reported cough and inguinal LAD as mentioned.  He also has non-intentional weight loss of about 30-40-lb over the past year.  He has intermittent night sweat.  He still works full time without fatigue.   Patient denies headache, visual changes, confusion, drenching night sweats, palpable lymph node swelling, mucositis, odynophagia, dysphagia, nausea vomiting, jaundice, chest pain, palpitation, shortness of breath, dyspnea on exertion, gum bleeding, epistaxis, hematemesis, hemoptysis, abdominal pain, abdominal swelling, early satiety, melena, hematochezia, hematuria, skin rash, spontaneous bleeding, joint swelling, joint pain, heat or cold intolerance, bowel bladder incontinence, back pain, focal motor weakness, paresthesia, depression, suicidal or homocidal ideation, feeling hopelessness.   Past Medical History  Diagnosis Date  . Hypertension   . Hyperlipidemia   . CAD (coronary artery disease)     s/p 5V  CABG 8/11  . RCA occlusion   . Multiple thyroid nodules   . Hydronephrosis   . Lymphadenopathy   . Anemia   . Renal insufficiency   . Lymphocytosis   :    Past Surgical History  Procedure Date  . Vasectomy   . Bunionectomy   . Tonsillectomy   . Coronary artery bypass graft 10/17/2009    LIMA to LAD,SVG to Ramus,SVG to OM sequential to OM2, SVG to marginal of RCA  . Cardiac catheterization 10/15/2009  :   CURRENT MEDS: Current Outpatient Prescriptions  Medication Sig Dispense Refill  . acetaminophen (TYLENOL) 500 MG tablet Take 1,000 mg by mouth every 6 (six) hours as needed.      Marland Kitchen aspirin 81 MG tablet Take 81 mg by mouth daily.        . cetirizine (ZYRTEC) 10 MG chewable tablet Chew 1 tablet (10 mg total) by mouth daily.  30 tablet  2  . metoprolol (TOPROL-XL) 50 MG 24 hr tablet TAKE 1 TABLET EVERY DAY  30 tablet  2  . rosuvastatin (CRESTOR) 20 MG tablet Take 1 tablet (20 mg total) by mouth every other day.  30 tablet  6      No Known Allergies:  Family History  Problem Relation Age of Onset  . Alcohol abuse    . Arthritis    . Diabetes Father   . Coronary artery disease Father 94    dscd  . Kidney disease    . Prostate cancer    . Hypertension Mother   . Hyperlipidemia    :  History   Social History  . Marital  Status: Married    Spouse Name: N/A    Number of Children: 2  . Years of Education: N/A   Occupational History  . 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    Social History Main Topics  . Smoking status: Never Smoker   . Smokeless tobacco: Never Used  . Alcohol Use: 0.6 oz/week    1 Cans of beer per week  . Drug Use: No  . Sexually Active: Not on file   Other Topics Concern  . Not on file   Social History Narrative  . No narrative on file    REVIEW OF SYSTEM:  The rest of the 14-point review of sytem was negative.   Exam:  General:  well-nourished in no acute distress.  Eyes:  no scleral icterus.  ENT:  There were no  oropharyngeal lesions.  Neck exam showed bilateral thyromegaly.  Lymphatics: positive for left supraclavicular node about 1cm; right cervical level II about 2cm; bilateral inguinal adenopathy of about 2-3cm.    Respiratory: lungs were clear bilaterally without wheezing or crackles.  Cardiovascular:  Regular rate and rhythm, S1/S2, without murmur, rub or gallop.  There was no pedal edema.  GI:  abdomen was soft, flat, nontender, nondistended, without organomegaly.  Muscoloskeletal:  no spinal tenderness of palpation of vertebral spine.  Skin exam was without echymosis, petichae.  Neuro exam was nonfocal.  Patient was able to get on and off exam table without assistance.  Gait was normal.  Patient was alerted and oriented.  Attention was good.   Language was appropriate.  Mood was normal without depression.  Speech was not pressured.  Thought content was not tangential.    LABS:  Lab Results  Component Value Date   WBC 14.1* 05/27/2011   HGB 10.8* 05/27/2011   HCT 35.0* 05/27/2011   PLT 347 05/27/2011   GLUCOSE 91 12/04/2009   CHOL 122 10/13/2010   TRIG 43.0 10/13/2010   HDL 29.10* 10/13/2010   LDLCALC 84 10/13/2010   ALT 12 10/13/2010   AST 17 10/13/2010   NA 138 12/04/2009   K 4.3 12/04/2009   CL 104 12/04/2009   CREATININE 1.2 12/04/2009   BUN 15 12/04/2009   CO2 25 12/04/2009   TSH 0.16* 12/10/2009   PSA 1.28 09/05/2009   INR 1.46 10/17/2009   HGBA1C 6.2 12/04/2009    US Renal  05/26/2011  *RADIOLOGY REPORT*  Clinical Data: Stage III kidney disease.  RENAL/URINARY TRACT ULTRASOUND COMPLETE  Comparison:  None.  Findings:  Right Kidney:  11.9 cm in length.  Mild diffuse increased echogenicity suggesting medical renal disease.  Normal renal cortical thickness.  Mild hydronephrosis.  Left Kidney:  11.6 cm in length.  Mild diffuse increased echogenicity but normal renal cortical thickness.  Moderate hydronephrosis.  Bladder:  Normal.  Ureteral jets are identified.  Additional findings:  Mild prostate gland  enlargement. Enlarged pelvic retroperitoneal lymph nodes are suspected.  CT may be helpful for further evaluation of these nodes and the hydronephrosis.  IMPRESSION:  1.  Bilateral hydronephrosis, right greater than left. 2.  Increased echogenicity of both kidneys suggesting medical renal disease. 3.  Possible pelvic or retroperitoneal lymph nodes. 4.  CT suggested for further evaluation.  Original Report Authenticated By: P. Loralie Champagne, M.D.   Blood smear review:   I personally reviewed the patient's peripheral blood smear today.  There was isocytosis.  There was no peripheral blast.  There was no schistocytosis, spherocytosis, target cell, rouleaux formation, tear drop  cell.  There was no giant platelets or platelet clumps.     ASSESSMENT AND PLAN:   1.  Lymphadenopathy in right neck, left supraclavicular, bilateral inguinal adenopathy; possibly more:  - Ddx:  Lymphoproliferative diease, sarcoidosis, other inflammatory conditions, less likely due to infection due to lack of fever, or focal infection. - Work up:  I requested chest/abd/pelvic CT within 1 wk.  I referred him to Dr. Serena Colonel for diagnotic biopsy of right cervical node.  If this is not diagnostic, I will refer him to Gen Surg for excisional biopsy of an inguinal node.  2.  Leukocytosis:   - Ddx:  Concerning for lymphoproliferative process as #1.  Reviewing his peripheral blood smear today, I have low clinical concern for myeloproliferative process such as acute leukemia.   - Work up:  As #1.  3.  Hydronophrosis:  Due to obstructive adenopathy.  Work up with CT noncontrast.  Treating underlying dz.  I will continue to monitor his Cr and consider stents in the future if his Cr worsens.   4.  Anemia:  Most likely due to chronic inflammation vs chronic renal insufficiency.  His his Hgb in the past was normal in 2011, it is unlikely that he has hemoglobinopathy.  I sent for anemia work up today with VitB12, SPEP, iron panel.  5.   Thyroid nodules:  Most likely benign.  However, there is a dominant nodule in the right lobe.  His PCP has sent him to US-guided biopsy.    6.  HTN:  He is on Toprol per PCP.   7.  HLP:  He is on Crestor per PCP.   The length of time of the face-to-face encounter was 45 minutes. More than 50% of time was spent counseling and coordination of care.

## 2011-05-28 ENCOUNTER — Telehealth: Payer: Self-pay | Admitting: *Deleted

## 2011-05-28 ENCOUNTER — Ambulatory Visit (HOSPITAL_COMMUNITY)
Admission: RE | Admit: 2011-05-28 | Discharge: 2011-05-28 | Disposition: A | Discharge status: Home / Self Care | Payer: BC Managed Care – PPO | Source: Ambulatory Visit | Attending: Oncology | Admitting: Oncology

## 2011-05-28 ENCOUNTER — Encounter: Payer: Self-pay | Admitting: *Deleted

## 2011-05-28 DIAGNOSIS — D649 Anemia, unspecified: Secondary | ICD-10-CM

## 2011-05-28 DIAGNOSIS — N1330 Unspecified hydronephrosis: Secondary | ICD-10-CM | POA: Insufficient documentation

## 2011-05-28 DIAGNOSIS — E049 Nontoxic goiter, unspecified: Secondary | ICD-10-CM | POA: Insufficient documentation

## 2011-05-28 DIAGNOSIS — R591 Generalized enlarged lymph nodes: Secondary | ICD-10-CM

## 2011-05-28 DIAGNOSIS — R599 Enlarged lymph nodes, unspecified: Secondary | ICD-10-CM | POA: Insufficient documentation

## 2011-05-28 DIAGNOSIS — D72829 Elevated white blood cell count, unspecified: Secondary | ICD-10-CM

## 2011-05-28 NOTE — Telephone Encounter (Signed)
Call from daughter asking about results of CT scan.  States pt was not able to explain it very well.  Informed Daughter that per dr. Gaylyn Rong, scan showed strong suspicion for Lymphoma and Biopsy needed asap to confirm if this is Lymphoma and what type of Lymphoma so treatment (chemo) can be determined.  Pt has appt w/ surgeon,  Dr. Andrey Campanile on 3/19 for biopsy.  Explained pt has some hydronephrosis which hopefully will resolve once chemotherapy started.  Instructed her to call if any new questions/concerns.  She verbalized understanding.

## 2011-05-31 LAB — COMPREHENSIVE METABOLIC PANEL
AST: 13 U/L (ref 0–37)
BUN: 20 mg/dL (ref 6–23)
Calcium: 9.7 mg/dL (ref 8.4–10.5)
Chloride: 101 mEq/L (ref 96–112)
Creatinine, Ser: 1.78 mg/dL — ABNORMAL HIGH (ref 0.50–1.35)
Glucose, Bld: 102 mg/dL — ABNORMAL HIGH (ref 70–99)

## 2011-05-31 LAB — PROTEIN ELECTROPHORESIS, SERUM
Alpha-1-Globulin: 7.4 % — ABNORMAL HIGH (ref 2.9–4.9)
Beta 2: 8.2 % — ABNORMAL HIGH (ref 3.2–6.5)
Gamma Globulin: 28.2 % — ABNORMAL HIGH (ref 11.1–18.8)

## 2011-05-31 LAB — TESTOSTERONE: Testosterone: 188.46 ng/dL — ABNORMAL LOW (ref 250–890)

## 2011-06-01 ENCOUNTER — Encounter (INDEPENDENT_AMBULATORY_CARE_PROVIDER_SITE_OTHER): Payer: Self-pay | Admitting: General Surgery

## 2011-06-01 ENCOUNTER — Ambulatory Visit (INDEPENDENT_AMBULATORY_CARE_PROVIDER_SITE_OTHER): Payer: BC Managed Care – PPO | Admitting: General Surgery

## 2011-06-01 VITALS — BP 122/70 | HR 100 | Resp 20 | Ht 70.0 in | Wt 224.0 lb

## 2011-06-01 DIAGNOSIS — R591 Generalized enlarged lymph nodes: Secondary | ICD-10-CM

## 2011-06-01 DIAGNOSIS — R599 Enlarged lymph nodes, unspecified: Secondary | ICD-10-CM

## 2011-06-01 NOTE — Progress Notes (Signed)
Patient ID: Devon Brady, male   DOB: 02/12/1951, 61 y.o.   MRN: [id number]  Chief Complaint  Patient presents with  . Lymphadenopathy    HPI Devon Brady is a 61 y.o. male.  HPI 61-year-old African American male referred by Dr. Ha for evaluation for lymph node biopsy. The patient has had vague complaints for several months. He states that he has had a nonproductive cough for several months. He ended up getting a new primary care physician who performed a yearly physical and found that he was anemic with low iron and elevated white blood cell count. On repeat labs, he was now found to have an elevated creatinine. This prompted an abdominal ultrasound which revealed a retroperitoneal lymphadenopathy. He states that he has seen the kidney specialist and was subsequently referred to Dr Ha for evaluation. A CT scan was performed which demonstrated significant lymphadenopathy. He was referred here to discuss lymph node biopsy. He states that he has had some swelling in his axilla and groin for several months. He has had some chills but no fevers. He denies weight loss. He states that he was also found to have nodules in this thyroid. He denies any hoarseness or pain or difficulty swallowing. He denies any family history of cancer. He reports that he moves his bowels every day. He denies any melena or hematochezia. He denies any weakness or decrease in sensation in any extremities Past Medical History  Diagnosis Date  . Hypertension   . Hyperlipidemia   . CAD (coronary artery disease)     s/p 5V CABG 8/11  . RCA occlusion   . Multiple thyroid nodules   . Hydronephrosis   . Lymphadenopathy   . Anemia   . Renal insufficiency   . Lymphocytosis     Past Surgical History  Procedure Date  . Vasectomy   . Bunionectomy   . Tonsillectomy   . Coronary artery bypass graft 10/17/2009    LIMA to LAD,SVG to Ramus,SVG to OM sequential to OM2, SVG to marginal of RCA  . Cardiac catheterization  10/15/2009    Family History  Problem Relation Age of Onset  . Alcohol abuse    . Arthritis    . Diabetes Father   . Coronary artery disease Father 69    dscd  . Kidney disease    . Prostate cancer    . Hypertension Mother   . Hyperlipidemia      Social History History  Substance Use Topics  . Smoking status: Never Smoker   . Smokeless tobacco: Never Used  . Alcohol Use: 0.6 oz/week    1 Cans of beer per week    No Known Allergies  Current Outpatient Prescriptions  Medication Sig Dispense Refill  . aspirin 81 MG tablet Take 81 mg by mouth daily.        . cetirizine (ZYRTEC) 10 MG chewable tablet Chew 1 tablet (10 mg total) by mouth daily.  30 tablet  2  . Coenzyme Q10 (COQ-10) 200 MG CAPS Take 1 capsule by mouth daily.      . fluticasone (FLONASE) 50 MCG/ACT nasal spray Place 2 sprays into the nose daily.      . metoprolol (TOPROL-XL) 50 MG 24 hr tablet TAKE 1 TABLET EVERY DAY  30 tablet  2  . Multiple Vitamin (MULTIVITAMIN) tablet Take 1 tablet by mouth daily.      . Omega-3 Fatty Acids (FISH OIL) 1000 MG CAPS Take 1 capsule by mouth daily.      .   omeprazole (PRILOSEC) 40 MG capsule Take 40 mg by mouth daily.      . rosuvastatin (CRESTOR) 20 MG tablet Take 1 tablet (20 mg total) by mouth every other day.  30 tablet  6  . terbinafine (LAMISIL) 250 MG tablet Take 250 mg by mouth daily.      . acetaminophen (TYLENOL) 500 MG tablet Take 1,000 mg by mouth every 6 (six) hours as needed.        Review of Systems Review of Systems  Constitutional: Positive for chills. Negative for fever, appetite change and unexpected weight change.  HENT: Positive for congestion. Negative for hearing loss, trouble swallowing and neck pain.        No dysphagia  Eyes: Negative for photophobia and visual disturbance.  Respiratory: Positive for cough. Negative for chest tightness and shortness of breath.   Cardiovascular: Negative for chest pain and leg swelling.       No PND, no orthopnea, no  DOE  Gastrointestinal: Negative for abdominal pain, diarrhea, constipation, blood in stool and abdominal distention.       See HPI  Genitourinary: Negative for dysuria and hematuria.  Musculoskeletal: Negative.   Skin: Negative for rash.  Neurological: Negative for seizures and speech difficulty.  Hematological: Positive for adenopathy. Does not bruise/bleed easily.  Psychiatric/Behavioral: Negative for behavioral problems and confusion.    Blood pressure 122/70, pulse 100, resp. rate 20, height 5' 10" (1.778 m), weight 224 lb (101.606 kg).  Physical Exam Physical Exam  Vitals reviewed. Constitutional: He is oriented to person, place, and time. He appears well-developed and well-nourished. No distress.  HENT:  Head: Normocephalic and atraumatic.  Right Ear: External ear normal.  Left Ear: External ear normal.  Nose: Nose normal.  Mouth/Throat: No oropharyngeal exudate.  Eyes: Conjunctivae are normal. No scleral icterus.  Neck: Normal range of motion. Neck supple. No JVD present. No tracheal deviation present. Thyromegaly present.  Cardiovascular: Normal rate, regular rhythm, normal heart sounds and intact distal pulses.   No murmur heard. Pulmonary/Chest: Effort normal and breath sounds normal. No respiratory distress. He has no wheezes.    Abdominal: Soft. Bowel sounds are normal. He exhibits no distension. There is no tenderness. There is no rebound.  Musculoskeletal: Normal range of motion. He exhibits no edema and no tenderness.  Lymphadenopathy:       Head (right side): Occipital adenopathy present.    He has no axillary adenopathy.       Right: Inguinal adenopathy present.       Left: Inguinal and supraclavicular adenopathy present.       Rt inguinal LAD (about 2.5cm) more prominent than left  Neurological: He is alert and oriented to person, place, and time. He exhibits normal muscle tone.  Skin: Skin is warm and dry. No rash noted. He is not diaphoretic. No erythema.    Psychiatric: He has a normal mood and affect. His behavior is normal. Judgment and thought content normal.    Data Reviewed Dr Ha's note Labs Ct C/A/P Renal u/s  Assessment    Lymphadenopathy - B/l inguinal, Right neck, left supraclavicular area HTN HLP Hydronephrosis Leukocytosis Anemia    Plan    I agree with Dr. Ha with respect to needing a lymph node biopsy to further workup this lymphadenopathy. I explained that when there is a concern of lymphoma a surgical biopsy is generally best. We discussed the risk and benefits of surgery including but not limited to bleeding, infection, injury to surrounding structures, scarring, hematoma   formation, seroma formation, lymph leak, lymphocele, need for additional procedures, blood clot formation anesthesia risks, and the typical recovery course.  We will schedule him for a right inguinal lymph node biopsy as soon as possible. The right inguinal area has the most prominent lymph nodes on physical exam and should be the easiest to target.  Lexus Shampine M. Sharnee Douglass, MD, FACS General, Bariatric, & Minimally Invasive Surgery [facility]        Tennis Mckinnon M 06/01/2011, 12:13 PM    

## 2011-06-02 ENCOUNTER — Telehealth: Payer: Self-pay | Admitting: Oncology

## 2011-06-02 NOTE — Telephone Encounter (Signed)
No visit

## 2011-06-03 ENCOUNTER — Encounter (HOSPITAL_COMMUNITY): Payer: Self-pay

## 2011-06-03 ENCOUNTER — Encounter (HOSPITAL_COMMUNITY)
Admission: RE | Admit: 2011-06-03 | Discharge: 2011-06-03 | Disposition: A | Discharge status: Home / Self Care | Payer: BC Managed Care – PPO | Source: Ambulatory Visit | Attending: General Surgery | Admitting: General Surgery

## 2011-06-03 ENCOUNTER — Other Ambulatory Visit: Payer: Self-pay

## 2011-06-03 HISTORY — DX: Unspecified hemorrhoids: K64.9

## 2011-06-03 HISTORY — DX: Sleep apnea, unspecified: G47.30

## 2011-06-03 LAB — CBC
MCH: 22.6 pg — ABNORMAL LOW (ref 26.0–34.0)
Platelets: 453 10*3/uL — ABNORMAL HIGH (ref 150–400)
RBC: 4.78 MIL/uL (ref 4.22–5.81)
WBC: 15.3 10*3/uL — ABNORMAL HIGH (ref 4.0–10.5)

## 2011-06-03 LAB — SURGICAL PCR SCREEN: MRSA, PCR: NEGATIVE

## 2011-06-03 LAB — BASIC METABOLIC PANEL
CO2: 29 mEq/L (ref 19–32)
Calcium: 10.5 mg/dL (ref 8.4–10.5)
Chloride: 98 mEq/L (ref 96–112)
Sodium: 135 mEq/L (ref 135–145)

## 2011-06-03 NOTE — Pre-Procedure Instructions (Signed)
Creatinine 1.8 and wbc=15.3 results called to robin parker cma for dr Andrey Campanile

## 2011-06-03 NOTE — Patient Instructions (Addendum)
20 RICKY GALLERY  06/03/2011   Your procedure is scheduled on:  06-07-2011  Report to [facility] at 1100am Call this number if you have problems the morning of surgery: [phone]   Remember:    no food After Midnight., clear liquids midnight until 7030am, then nothing by mouth  .  Take these medicines the morning of surgery with A SIP OF WATER: metorpolol succinate, flonase nasal spray if needed, omeprazole   Do not wear jewelry or make up.  Do not wear lotions, powders, or perfumes.Do not wear deodorant.    Do not bring valuables to the hospital.  Contacts, dentures or bridgework may not be worn into surgery.  Leave suitcase in the car. After surgery it may be brought to your room.  For patients admitted to the hospital, checkout time is 11:00 AM the day of discharge.     Special Instructions: CHG Shower Use Special Wash: 1/2 bottle night before surgery and 1/2 bottle morning of surgery.neck down avoid private area   Please read over the following fact sheets that you were given: MRSA Information  C[facility] pre op nurse phone number [phone], call if needed

## 2011-06-03 NOTE — Pre-Procedure Instructions (Addendum)
09-24-2009 echo epic Last  Cardiology office visit note dr Clifton James 10-13-2010 epic Chest ct 05-28-2011 on chart and in epic

## 2011-06-04 ENCOUNTER — Telehealth: Payer: Self-pay | Admitting: *Deleted

## 2011-06-04 NOTE — Telephone Encounter (Signed)
Pt called to report his Bx is scheduled for Monday 3/25 and his f/u w/ Dr. Gaylyn Rong is on 3/26.  Does Dr. Gaylyn Rong want to see pt on 3/26 or delay visit a few days until he has biopsy results?  Pt states RN can call his wife on Monday w/ any appt changes at [phone].

## 2011-06-05 ENCOUNTER — Other Ambulatory Visit: Payer: Self-pay | Admitting: *Deleted

## 2011-06-07 ENCOUNTER — Other Ambulatory Visit: Payer: Self-pay | Admitting: *Deleted

## 2011-06-07 ENCOUNTER — Other Ambulatory Visit: Payer: Self-pay | Admitting: Oncology

## 2011-06-07 ENCOUNTER — Telehealth: Payer: Self-pay | Admitting: *Deleted

## 2011-06-07 ENCOUNTER — Encounter (HOSPITAL_COMMUNITY)
Admission: RE | Disposition: A | Discharge status: Home / Self Care | Payer: Self-pay | Source: Ambulatory Visit | Attending: General Surgery

## 2011-06-07 ENCOUNTER — Telehealth: Payer: Self-pay | Admitting: Oncology

## 2011-06-07 ENCOUNTER — Encounter (HOSPITAL_COMMUNITY): Payer: Self-pay | Admitting: *Deleted

## 2011-06-07 ENCOUNTER — Ambulatory Visit (HOSPITAL_COMMUNITY)
Admission: RE | Admit: 2011-06-07 | Discharge: 2011-06-07 | Disposition: A | Discharge status: Home / Self Care | Payer: BC Managed Care – PPO | Source: Ambulatory Visit | Attending: General Surgery | Admitting: General Surgery

## 2011-06-07 ENCOUNTER — Ambulatory Visit (HOSPITAL_COMMUNITY): Payer: BC Managed Care – PPO | Admitting: *Deleted

## 2011-06-07 DIAGNOSIS — Z951 Presence of aortocoronary bypass graft: Secondary | ICD-10-CM | POA: Insufficient documentation

## 2011-06-07 DIAGNOSIS — E785 Hyperlipidemia, unspecified: Secondary | ICD-10-CM | POA: Insufficient documentation

## 2011-06-07 DIAGNOSIS — D72829 Elevated white blood cell count, unspecified: Secondary | ICD-10-CM | POA: Insufficient documentation

## 2011-06-07 DIAGNOSIS — Z01810 Encounter for preprocedural cardiovascular examination: Secondary | ICD-10-CM | POA: Insufficient documentation

## 2011-06-07 DIAGNOSIS — Z01812 Encounter for preprocedural laboratory examination: Secondary | ICD-10-CM | POA: Insufficient documentation

## 2011-06-07 DIAGNOSIS — I10 Essential (primary) hypertension: Secondary | ICD-10-CM | POA: Insufficient documentation

## 2011-06-07 DIAGNOSIS — I2510 Atherosclerotic heart disease of native coronary artery without angina pectoris: Secondary | ICD-10-CM | POA: Insufficient documentation

## 2011-06-07 DIAGNOSIS — C8595 Non-Hodgkin lymphoma, unspecified, lymph nodes of inguinal region and lower limb: Secondary | ICD-10-CM

## 2011-06-07 DIAGNOSIS — Z79899 Other long term (current) drug therapy: Secondary | ICD-10-CM | POA: Insufficient documentation

## 2011-06-07 DIAGNOSIS — C8115 Nodular sclerosis classical Hodgkin lymphoma, lymph nodes of inguinal region and lower limb: Secondary | ICD-10-CM | POA: Insufficient documentation

## 2011-06-07 DIAGNOSIS — R599 Enlarged lymph nodes, unspecified: Secondary | ICD-10-CM | POA: Insufficient documentation

## 2011-06-07 DIAGNOSIS — R591 Generalized enlarged lymph nodes: Secondary | ICD-10-CM

## 2011-06-07 DIAGNOSIS — Z7982 Long term (current) use of aspirin: Secondary | ICD-10-CM | POA: Insufficient documentation

## 2011-06-07 DIAGNOSIS — N1330 Unspecified hydronephrosis: Secondary | ICD-10-CM | POA: Insufficient documentation

## 2011-06-07 DIAGNOSIS — D649 Anemia, unspecified: Secondary | ICD-10-CM | POA: Insufficient documentation

## 2011-06-07 HISTORY — PX: LYMPH NODE BIOPSY: SHX201

## 2011-06-07 SURGERY — BIOPSY, LYMPH NODE, INGUINAL, OPEN
Anesthesia: General | Site: Groin | Laterality: Right | Wound class: Clean

## 2011-06-07 MED ORDER — LIDOCAINE HCL 1 % IJ SOLN
INTRAMUSCULAR | Status: DC | PRN
  Administered 2011-06-07 (×2)

## 2011-06-07 MED ORDER — OXYCODONE HCL 5 MG PO TABS: 5.0000 mg | ORAL_TABLET | ORAL | Status: DC | PRN

## 2011-06-07 MED ORDER — PROMETHAZINE HCL 25 MG/ML IJ SOLN: 6.2500 mg | INTRAMUSCULAR | Status: DC | PRN

## 2011-06-07 MED ORDER — CEFAZOLIN SODIUM 1-5 GM-% IV SOLN
INTRAVENOUS | Status: DC | PRN
  Administered 2011-06-07: 2 g via INTRAVENOUS

## 2011-06-07 MED ORDER — ACETAMINOPHEN 325 MG PO TABS: 650.0000 mg | ORAL_TABLET | ORAL | Status: DC | PRN

## 2011-06-07 MED ORDER — ONDANSETRON HCL 4 MG/2ML IJ SOLN: 4.0000 mg | Freq: Four times a day (QID) | INTRAMUSCULAR | Status: DC | PRN

## 2011-06-07 MED ORDER — LACTATED RINGERS IV SOLN
INTRAVENOUS | Status: DC
  Administered 2011-06-07: 1000 mL via INTRAVENOUS

## 2011-06-07 MED ORDER — MIDAZOLAM HCL 5 MG/5ML IJ SOLN
INTRAMUSCULAR | Status: DC | PRN
  Administered 2011-06-07 (×2): 1 mg via INTRAVENOUS
  Administered 2011-06-07 (×2): 0.5 mg via INTRAVENOUS
  Administered 2011-06-07: 1 mg via INTRAVENOUS

## 2011-06-07 MED ORDER — LIDOCAINE HCL (CARDIAC) 20 MG/ML IV SOLN
INTRAVENOUS | Status: DC | PRN
  Administered 2011-06-07: 50 mg via INTRAVENOUS

## 2011-06-07 MED ORDER — DEXAMETHASONE SODIUM PHOSPHATE 10 MG/ML IJ SOLN
INTRAMUSCULAR | Status: DC | PRN
  Administered 2011-06-07: 10 mg via INTRAVENOUS

## 2011-06-07 MED ORDER — ACETAMINOPHEN 10 MG/ML IV SOLN
INTRAVENOUS | Status: DC | PRN
  Administered 2011-06-07: 1000 mg via INTRAVENOUS

## 2011-06-07 MED ORDER — SODIUM CHLORIDE 0.9 % IJ SOLN: 3.0000 mL | INTRAMUSCULAR | Status: DC | PRN

## 2011-06-07 MED ORDER — ONDANSETRON HCL 4 MG/2ML IJ SOLN
INTRAMUSCULAR | Status: DC | PRN
  Administered 2011-06-07: 4 mg via INTRAVENOUS

## 2011-06-07 MED ORDER — FENTANYL CITRATE 0.05 MG/ML IJ SOLN
INTRAMUSCULAR | Status: DC | PRN
  Administered 2011-06-07 (×2): 25 ug via INTRAVENOUS
  Administered 2011-06-07 (×2): 50 ug via INTRAVENOUS

## 2011-06-07 MED ORDER — OXYCODONE-ACETAMINOPHEN 5-325 MG PO TABS: 1.0000 | ORAL_TABLET | ORAL | Status: DC | PRN

## 2011-06-07 MED ORDER — PROPOFOL 10 MG/ML IV EMUL
INTRAVENOUS | Status: DC | PRN
  Administered 2011-06-07: 120 ug/kg/min via INTRAVENOUS

## 2011-06-07 MED ORDER — ACETAMINOPHEN 650 MG RE SUPP: 650.0000 mg | RECTAL | Status: DC | PRN

## 2011-06-07 MED ORDER — BUPIVACAINE HCL (PF) 0.5 % IJ SOLN
INTRAMUSCULAR | Status: AC
  Filled 2011-06-07: qty 30

## 2011-06-07 MED ORDER — GLYCOPYRROLATE 0.2 MG/ML IJ SOLN
INTRAMUSCULAR | Status: DC | PRN
  Administered 2011-06-07 (×4): .02 mg via INTRAVENOUS
  Administered 2011-06-07 (×2): .03 mg via INTRAVENOUS
  Administered 2011-06-07 (×3): .02 mg via INTRAVENOUS

## 2011-06-07 MED ORDER — SODIUM CHLORIDE 0.9 % IV SOLN: 250.0000 mL | INTRAVENOUS | Status: DC | PRN

## 2011-06-07 MED ORDER — LACTATED RINGERS IV SOLN
INTRAVENOUS | Status: DC | PRN
  Administered 2011-06-07 (×2): via INTRAVENOUS

## 2011-06-07 MED ORDER — CEFAZOLIN SODIUM 1-5 GM-% IV SOLN
INTRAVENOUS | Status: AC
  Filled 2011-06-07: qty 100

## 2011-06-07 MED ORDER — SODIUM CHLORIDE 0.9 % IJ SOLN: 3.0000 mL | Freq: Two times a day (BID) | INTRAMUSCULAR | Status: DC

## 2011-06-07 MED ORDER — HYDROMORPHONE HCL PF 1 MG/ML IJ SOLN: 0.2500 mg | INTRAMUSCULAR | Status: DC | PRN

## 2011-06-07 MED ORDER — ACETAMINOPHEN 10 MG/ML IV SOLN
INTRAVENOUS | Status: AC
  Filled 2011-06-07: qty 100

## 2011-06-07 MED ORDER — BUPIVACAINE-EPINEPHRINE PF 0.25-1:200000 % IJ SOLN
INTRAMUSCULAR | Status: AC
  Filled 2011-06-07: qty 30

## 2011-06-07 MED ORDER — CEFAZOLIN SODIUM-DEXTROSE 2-3 GM-% IV SOLR: 2.0000 g | INTRAVENOUS | Status: DC

## 2011-06-07 MED ORDER — KETAMINE HCL 10 MG/ML IJ SOLN
INTRAMUSCULAR | Status: DC | PRN
  Administered 2011-06-07 (×6): 10 mg via INTRAVENOUS

## 2011-06-07 SURGICAL SUPPLY — 39 items
APPLICATOR COTTON TIP 6IN STRL (MISCELLANEOUS) ×2 IMPLANT
APPLIER CLIP 11 MED OPEN (CLIP) ×2
BENZOIN TINCTURE PRP APPL 2/3 (GAUZE/BANDAGES/DRESSINGS) ×2 IMPLANT
BLADE HEX COATED 2.75 (ELECTRODE) ×2 IMPLANT
BLADE SURG 15 STRL LF DISP TIS (BLADE) ×1 IMPLANT
BLADE SURG 15 STRL SS (BLADE) ×1
BLADE SURG SZ10 CARB STEEL (BLADE) IMPLANT
CANISTER SUCTION 2500CC (MISCELLANEOUS) ×2 IMPLANT
CHLORAPREP W/TINT 26ML (MISCELLANEOUS) ×2 IMPLANT
CLIP APPLIE 11 MED OPEN (CLIP) ×1 IMPLANT
CLOTH BEACON ORANGE TIMEOUT ST (SAFETY) ×2 IMPLANT
COVER SURGICAL LIGHT HANDLE (MISCELLANEOUS) ×2 IMPLANT
DECANTER SPIKE VIAL GLASS SM (MISCELLANEOUS) ×2 IMPLANT
DERMABOND ADVANCED (GAUZE/BANDAGES/DRESSINGS) ×1
DERMABOND ADVANCED .7 DNX12 (GAUZE/BANDAGES/DRESSINGS) ×1 IMPLANT
DRAPE LAPAROTOMY TRNSV 102X78 (DRAPE) ×2 IMPLANT
DRAPE UTILITY 15X26 (DRAPE) ×2 IMPLANT
ELECT REM PT RETURN 9FT ADLT (ELECTROSURGICAL) ×2
ELECTRODE REM PT RTRN 9FT ADLT (ELECTROSURGICAL) ×1 IMPLANT
GLOVE BIOGEL PI IND STRL 7.0 (GLOVE) ×1 IMPLANT
GLOVE BIOGEL PI INDICATOR 7.0 (GLOVE) ×1
GLOVE SURG SIGNA 7.5 PF LTX (GLOVE) ×2 IMPLANT
GOWN STRL NON-REIN LRG LVL3 (GOWN DISPOSABLE) ×2 IMPLANT
GOWN STRL REIN XL XLG (GOWN DISPOSABLE) ×4 IMPLANT
KIT BASIN OR (CUSTOM PROCEDURE TRAY) ×2 IMPLANT
NEEDLE HYPO 25X1 1.5 SAFETY (NEEDLE) ×2 IMPLANT
NS IRRIG 1000ML POUR BTL (IV SOLUTION) ×2 IMPLANT
PACK BASIC VI WITH GOWN DISP (CUSTOM PROCEDURE TRAY) ×2 IMPLANT
PENCIL BUTTON HOLSTER BLD 10FT (ELECTRODE) ×2 IMPLANT
SPONGE GAUZE 4X4 12PLY (GAUZE/BANDAGES/DRESSINGS) ×2 IMPLANT
SPONGE LAP 4X18 X RAY DECT (DISPOSABLE) ×6 IMPLANT
STRIP CLOSURE SKIN 1/2X4 (GAUZE/BANDAGES/DRESSINGS) ×2 IMPLANT
SUT MNCRL AB 4-0 PS2 18 (SUTURE) ×2 IMPLANT
SUT SILK 2 0 SH (SUTURE) ×2 IMPLANT
SUT VIC AB 3-0 SH 18 (SUTURE) ×2 IMPLANT
SYR BULB IRRIGATION 50ML (SYRINGE) ×2 IMPLANT
SYR CONTROL 10ML LL (SYRINGE) ×2 IMPLANT
TOWEL OR 17X26 10 PK STRL BLUE (TOWEL DISPOSABLE) ×4 IMPLANT
YANKAUER SUCT BULB TIP 10FT TU (MISCELLANEOUS) ×2 IMPLANT

## 2011-06-07 NOTE — Anesthesia Preprocedure Evaluation (Addendum)
Anesthesia Evaluation  Patient identified by MRN, date of birth, ID band Patient awake    Reviewed: Allergy & Precautions, H&P , NPO status , Patient's Chart, lab work & pertinent test results  Airway Mallampati: II TM Distance: >3 FB Neck ROM: Full    Dental No notable dental hx.    Pulmonary sleep apnea ,  breath sounds clear to auscultation  Pulmonary exam normal       Cardiovascular hypertension, Pt. on home beta blockers + CAD Rhythm:Regular Rate:Normal  S/p CABG 5v. 8/11   Neuro/Psych PSYCHIATRIC DISORDERS Depression negative neurological ROS     GI/Hepatic negative GI ROS, Neg liver ROS,   Endo/Other  negative endocrine ROS  Renal/GU negative Renal ROS  negative genitourinary   Musculoskeletal negative musculoskeletal ROS (+)   Abdominal (+) + obese,   Peds negative pediatric ROS (+)  Hematology negative hematology ROS (+)   Anesthesia Other Findings   Reproductive/Obstetrics negative OB ROS                          Anesthesia Physical Anesthesia Plan  ASA: III  Anesthesia Plan: MAC   Post-op Pain Management:    Induction: Intravenous  Airway Management Planned: LMA  Additional Equipment:   Intra-op Plan:   Post-operative Plan: Extubation in OR  Informed Consent: I have reviewed the patients History and Physical, chart, labs and discussed the procedure including the risks, benefits and alternatives for the proposed anesthesia with the patient or authorized representative who has indicated his/her understanding and acceptance.   Dental advisory given  Plan Discussed with: CRNA  Anesthesia Plan Comments: (Plan MAC with General backup.)      Anesthesia Quick Evaluation

## 2011-06-07 NOTE — Discharge Instructions (Signed)
[facility]   POST OP INSTRUCTIONS  Always review your discharge instruction sheet given to you by the facility where your surgery was performed. IF YOU HAVE DISABILITY OR FAMILY LEAVE FORMS, YOU MUST BRING THEM TO THE OFFICE FOR PROCESSING.   DO NOT GIVE THEM TO YOUR DOCTOR.  1. A  prescription for pain medication may be given to you upon discharge.  Take your pain medication as prescribed, if needed.  If narcotic pain medicine is not needed, then you may take acetaminophen (Tylenol) or ibuprofen (Advil) as needed. 2. Take your usually prescribed medications unless otherwise directed. 3. If you need a refill on your pain medication, please contact your pharmacy.  They will contact our office to request authorization. Prescriptions will not be filled after 5 pm or on week-ends. 4. You should follow a light diet the first 24 hours after arrival home, such as soup and crackers, etc.  Be sure to include lots of fluids daily.  Resume your normal diet the day after surgery. 5. Most patients will experience some swelling and bruising in the groin and scrotum.  Ice packs and reclining will help.  Swelling and bruising can take several days to resolve.  6. It is common to experience some constipation if taking pain medication after surgery.  Increasing fluid intake and taking a stool softener (such as Colace) will usually help or prevent this problem from occurring.  A mild laxative (Milk of Magnesia or Miralax) should be taken according to package directions if there are no bowel movements after 48 hours. 7.  If your surgeon used skin glue on the incision, you may shower in 24 hours.  The glue will flake off over the next 2-3 weeks.  Any sutures or staples will be removed at the office during your follow-up visit. 8. ACTIVITIES:  You may resume regular (light) daily activities beginning the next day--such as daily self-care, walking, climbing stairs--gradually increasing activities as  tolerated.  You may have sexual intercourse when it is comfortable.  Refrain from any heavy lifting or straining for 1 week. a. You may drive when you are no longer taking prescription pain medication, you can comfortably wear a seatbelt, and you can safely maneuver your car and apply brakes. b. RETURN TO WORK: several days 9. You should see your doctor in the office for a follow-up appointment approximately 2-3 weeks after your surgery.  Make sure that you call for this appointment within a day or two after you arrive home to insure a convenient appointment time. 10. OTHER INSTRUCTIONS:     WHEN TO CALL YOUR DOCTOR: 1. Fever over 101.0 2. Inability to urinate 3. Nausea and/or vomiting 4. Extreme swelling or bruising 5. Continued bleeding from incision. 6. Increased pain, redness, or drainage from the incision  The clinic staff is available to answer your questions during regular business hours.  Please don't hesitate to call and ask to speak to one of the nurses for clinical concerns.  If you have a medical emergency, go to the nearest emergency room or call 911.  A surgeon from [facility] is always on call at the hospital   [address], [place], [place]  [address] ?  [address] [address], [place], [place]   [address] [phone] ? [phone] ? FAX [phone] Web site: [email or web address]

## 2011-06-07 NOTE — Brief Op Note (Signed)
06/07/2011  3:32 PM  PATIENT:  Devon Brady  61 y.o. male  PRE-OPERATIVE DIAGNOSIS:  lymphadenopathy   POST-OPERATIVE DIAGNOSIS:  lymphadenopathy   PROCEDURE:  Procedure(s) (LRB): INGUINAL LYMPH NODE BIOPSY (Right)  SURGEON:  Surgeon(s) and Role:    * Atilano Ina, MD,FACS - Primary  PHYSICIAN ASSISTANT:   ASSISTANTS: none   ANESTHESIA:   local and MAC  EBL:  Total I/O In: 1000 [I.V.:1000] Out: -   BLOOD ADMINISTERED:none  DRAINS: none   LOCAL MEDICATIONS USED:  MARCAINE    and OTHER with epi  SPECIMEN:  Source of Specimen:  right inguinal lymph node  DISPOSITION OF SPECIMEN:  PATHOLOGY  COUNTS:  YES  TOURNIQUET:  * No tourniquets in log *  DICTATION: .Other Dictation: Dictation Number [id number]  PLAN OF CARE: Discharge to home after PACU  PATIENT DISPOSITION:  PACU - hemodynamically stable.   Delay start of Pharmacological VTE agent (>24hrs) due to surgical blood loss or risk of bleeding: not applicable  Mary Sella. Andrey Campanile, MD, FACS General, Bariatric, & Minimally Invasive Surgery [facility] [place]

## 2011-06-07 NOTE — H&P (View-Only) (Signed)
Patient ID: Devon Brady, male   DOB: [birth year], 61 y.o.   MRN: [id number]  Chief Complaint  Patient presents with  . Lymphadenopathy    HPI Devon Brady is a 61 y.o. male.  HPI 61 year old [place] American male referred by Dr. Gaylyn Rong for evaluation for lymph node biopsy. The patient has had vague complaints for several months. He states that he has had a nonproductive cough for several months. He ended up getting a new primary care physician who performed a yearly physical and found that he was anemic with low iron and elevated white blood cell count. On repeat labs, he was now found to have an elevated creatinine. This prompted an abdominal ultrasound which revealed a retroperitoneal lymphadenopathy. He states that he has seen the kidney specialist and was subsequently referred to Dr Gaylyn Rong for evaluation. A CT scan was performed which demonstrated significant lymphadenopathy. He was referred here to discuss lymph node biopsy. He states that he has had some swelling in his axilla and groin for several months. He has had some chills but no fevers. He denies weight loss. He states that he was also found to have nodules in this thyroid. He denies any hoarseness or pain or difficulty swallowing. He denies any family history of cancer. He reports that he moves his bowels every day. He denies any melena or hematochezia. He denies any weakness or decrease in sensation in any extremities Past Medical History  Diagnosis Date  . Hypertension   . Hyperlipidemia   . CAD (coronary artery disease)     s/p 5V CABG 8/11  . RCA occlusion   . Multiple thyroid nodules   . Hydronephrosis   . Lymphadenopathy   . Anemia   . Renal insufficiency   . Lymphocytosis     Past Surgical History  Procedure Date  . Vasectomy   . Bunionectomy   . Tonsillectomy   . Coronary artery bypass graft 10/17/2009    LIMA to LAD,SVG to Ramus,SVG to OM sequential to OM2, SVG to marginal of RCA  . Cardiac catheterization  10/15/2009    Family History  Problem Relation Age of Onset  . Alcohol abuse    . Arthritis    . Diabetes Father   . Coronary artery disease Father 39    dscd  . Kidney disease    . Prostate cancer    . Hypertension Mother   . Hyperlipidemia      Social History History  Substance Use Topics  . Smoking status: Never Smoker   . Smokeless tobacco: Never Used  . Alcohol Use: 0.6 oz/week    1 Cans of beer per week    No Known Allergies  Current Outpatient Prescriptions  Medication Sig Dispense Refill  . aspirin 81 MG tablet Take 81 mg by mouth daily.        . cetirizine (ZYRTEC) 10 MG chewable tablet Chew 1 tablet (10 mg total) by mouth daily.  30 tablet  2  . Coenzyme Q10 (COQ-10) 200 MG CAPS Take 1 capsule by mouth daily.      . fluticasone (FLONASE) 50 MCG/ACT nasal spray Place 2 sprays into the nose daily.      . metoprolol (TOPROL-XL) 50 MG 24 hr tablet TAKE 1 TABLET EVERY DAY  30 tablet  2  . Multiple Vitamin (MULTIVITAMIN) tablet Take 1 tablet by mouth daily.      . Omega-3 Fatty Acids (FISH OIL) 1000 MG CAPS Take 1 capsule by mouth daily.      Marland Kitchen  omeprazole (PRILOSEC) 40 MG capsule Take 40 mg by mouth daily.      . rosuvastatin (CRESTOR) 20 MG tablet Take 1 tablet (20 mg total) by mouth every other day.  30 tablet  6  . terbinafine (LAMISIL) 250 MG tablet Take 250 mg by mouth daily.      Marland Kitchen acetaminophen (TYLENOL) 500 MG tablet Take 1,000 mg by mouth every 6 (six) hours as needed.        Review of Systems Review of Systems  Constitutional: Positive for chills. Negative for fever, appetite change and unexpected weight change.  HENT: Positive for congestion. Negative for hearing loss, trouble swallowing and neck pain.        No dysphagia  Eyes: Negative for photophobia and visual disturbance.  Respiratory: Positive for cough. Negative for chest tightness and shortness of breath.   Cardiovascular: Negative for chest pain and leg swelling.       No PND, no orthopnea, no  DOE  Gastrointestinal: Negative for abdominal pain, diarrhea, constipation, blood in stool and abdominal distention.       See HPI  Genitourinary: Negative for dysuria and hematuria.  Musculoskeletal: Negative.   Skin: Negative for rash.  Neurological: Negative for seizures and speech difficulty.  Hematological: Positive for adenopathy. Does not bruise/bleed easily.  Psychiatric/Behavioral: Negative for behavioral problems and confusion.    Blood pressure 122/70, pulse 100, resp. rate 20, height 5\' 10"  (1.778 m), weight 224 lb (101.606 kg).  Physical Exam Physical Exam  Vitals reviewed. Constitutional: He is oriented to person, place, and time. He appears well-developed and well-nourished. No distress.  HENT:  Head: Normocephalic and atraumatic.  Right Ear: External ear normal.  Left Ear: External ear normal.  Nose: Nose normal.  Mouth/Throat: No oropharyngeal exudate.  Eyes: Conjunctivae are normal. No scleral icterus.  Neck: Normal range of motion. Neck supple. No JVD present. No tracheal deviation present. Thyromegaly present.  Cardiovascular: Normal rate, regular rhythm, normal heart sounds and intact distal pulses.   No murmur heard. Pulmonary/Chest: Effort normal and breath sounds normal. No respiratory distress. He has no wheezes.    Abdominal: Soft. Bowel sounds are normal. He exhibits no distension. There is no tenderness. There is no rebound.  Musculoskeletal: Normal range of motion. He exhibits no edema and no tenderness.  Lymphadenopathy:       Head (right side): Occipital adenopathy present.    He has no axillary adenopathy.       Right: Inguinal adenopathy present.       Left: Inguinal and supraclavicular adenopathy present.       Rt inguinal LAD (about 2.5cm) more prominent than left  Neurological: He is alert and oriented to person, place, and time. He exhibits normal muscle tone.  Skin: Skin is warm and dry. No rash noted. He is not diaphoretic. No erythema.    Psychiatric: He has a normal mood and affect. His behavior is normal. Judgment and thought content normal.    Data Reviewed Dr Lodema Pilot note Labs Ct C/A/P Renal u/s  Assessment    Lymphadenopathy - B/l inguinal, Right neck, left supraclavicular area HTN HLP Hydronephrosis Leukocytosis Anemia    Plan    I agree with Dr. Gaylyn Rong with respect to needing a lymph node biopsy to further workup this lymphadenopathy. I explained that when there is a concern of lymphoma a surgical biopsy is generally best. We discussed the risk and benefits of surgery including but not limited to bleeding, infection, injury to surrounding structures, scarring, hematoma  formation, seroma formation, lymph leak, lymphocele, need for additional procedures, blood clot formation anesthesia risks, and the typical recovery course.  We will schedule him for a right inguinal lymph node biopsy as soon as possible. The right inguinal area has the most prominent lymph nodes on physical exam and should be the easiest to target.  Mary Sella. Andrey Campanile, MD, FACS General, Bariatric, & Minimally Invasive Surgery [facility] [place]        [facility] M 06/01/2011, 12:13 PM

## 2011-06-07 NOTE — Transfer of Care (Signed)
Immediate Anesthesia Transfer of Care Note  Patient: Devon Brady  Procedure(s) Performed: Procedure(s) (LRB): INGUINAL LYMPH NODE BIOPSY (Right)  Patient Location: PACU  Anesthesia Type: MAC  Level of Consciousness: awake, alert , oriented, patient cooperative and responds to stimulation  Airway & Oxygen Therapy: Patient Spontanous Breathing and Patient connected to face mask  Post-op Assessment: Report given to PACU RN, Post -op Vital signs reviewed and stable and Patient moving all extremities  Post vital signs: Reviewed and stable  Complications: No apparent anesthesia complications

## 2011-06-07 NOTE — Telephone Encounter (Signed)
Left VM for pt's wife that pt's appt for tomorrow will be r/s to after biopsy pathology results are back.  Informed scheduling should be calling w/ new date and time for appts.  Asked her to call back if any questions.

## 2011-06-07 NOTE — Interval H&P Note (Signed)
History and Physical Interval Note:  06/07/2011 2:17 PM  Devon Brady  has presented today for surgery, with the diagnosis of lymphadenopathy   The various methods of treatment have been discussed with the patient and family. After consideration of risks, benefits and other options for treatment, the patient has consented to  Procedure(s) (LRB): INGUINAL LYMPH NODE BIOPSY (Right) as a surgical intervention .  The patients' history has been reviewed, patient examined, no change in status, stable for surgery.  I have reviewed the patients' chart and labs.  Questions were answered to the patient's satisfaction.    Mary Sella. Andrey Campanile, MD, FACS General, Bariatric, & Minimally Invasive Surgery [facility] [place]   [facility] M

## 2011-06-07 NOTE — Anesthesia Postprocedure Evaluation (Signed)
  Anesthesia Post-op Note  Patient: Devon Brady  Procedure(s) Performed: Procedure(s) (LRB): INGUINAL LYMPH NODE BIOPSY (Right)  Patient Location: PACU  Anesthesia Type: MAC  Level of Consciousness: awake and alert   Airway and Oxygen Therapy: Patient Spontanous Breathing  Post-op Pain: mild  Post-op Assessment: Post-op Vital signs reviewed, Patient's Cardiovascular Status Stable, Respiratory Function Stable, Patent Airway and No signs of Nausea or vomiting  Post-op Vital Signs: stable  Complications: No apparent anesthesia complications. Will stay 30 minutes in pacu and 1 hour in short stay.

## 2011-06-07 NOTE — Telephone Encounter (Signed)
called pt lmovm that his appt on 03/26 was r/s to 03/29 and to rtn call to confirm appt changes

## 2011-06-07 NOTE — Preoperative (Signed)
Beta Blockers   Reason not to administer Beta Blockers:Not Applicable 

## 2011-06-08 ENCOUNTER — Ambulatory Visit: Payer: BC Managed Care – PPO | Admitting: Oncology

## 2011-06-08 ENCOUNTER — Other Ambulatory Visit: Payer: BC Managed Care – PPO | Admitting: Lab

## 2011-06-08 NOTE — Op Note (Signed)
NAME:  MARTYN, TIMME NO.:  [id number]  MEDICAL RECORD NO.:  [id number]  LOCATION:  [facility]                         FACILITY:  [facility]  PHYSICIAN:  Mary Sella. Andrey Campanile, MD, FACSDATE OF BIRTH:  1950-12-24  DATE OF PROCEDURE: DATE OF DISCHARGE:  06/07/2011                              OPERATIVE REPORT   PREOPERATIVE DIAGNOSIS:  Diffuse lymphadenopathy.  POSTOPERATIVE DIAGNOSIS:  Diffuse lymphadenopathy.  PROCEDURE:  Right inguinal incisional lymph node biopsy.  SURGEON:  Mary Sella. Andrey Campanile, MD, FACS.  ASSISTANT:  None.  ANESTHESIA:  Monitored anesthesia care plus local consisting of 0.25% Marcaine with epi.  ESTIMATED BLOOD LOSS:  Minimal.  SPECIMEN:  Right inguinal lymph node.  COMPLICATIONS:  None immediately apparent.  INDICATIONS FOR PROCEDURE:  Patient is a very pleasant 61 year old African American male who has had vague complaints for several months. Routine labs demonstrated some lab abnormalities such as anemia as well as elevated white blood cell count in addition to elevated creatinine. Renal ultrasound revealed retroperitoneal lymphadenopathy, which prompted a CT scan, which demonstrated significant lymphadenopathy.  He was referred to me for tissue biopsy to help confirm his diagnosis of lymphoma.  We discussed at length the risks and benefits of surgery including, but not limited to bleeding, infection, injury to surrounding structures, blood clot formation, hematoma formation, seroma formation, lymph leak, need for additional procedures, wound complications, anesthesia risk, and the typical postop recovery course.  He elected to proceed with surgery.  DESCRIPTION OF PROCEDURE:  After obtaining informed consent and marking in the right groin in the holding area with patient confirming the operative site, he was taken back to operating room 6 at [facility].  Monitored anesthesia care was established.  His right lower abdominal wall, inguinal  and  proximal thigh were prepped and draped in usual standard surgical fashion with ChloraPrep.  He received antibiotics prior to skin incision.  He had a palpable lymph node in the lower right inguinal area.  I made an oblique line over this area with a marking pen.  I then infiltrated local.  I then made a 4-cm incision directly over this.  I divided the Scarpa's fascia with electrocautery.  I then placed a self-retracting retractor. I was then able to palpate some bulky lymph nodes.  I placed a 3-0 Vicryl through the bulky lymph nodes to help retraction.  I then using a tonsil, circumferentially dissected out around some of the lymph node and placed some clips around lymphatic vessels as well as electrocautery to help isolate it.  The lymph node that I found was very bulky and large and extended quite deep.  It was more than actually needed.  I actually amputated the lymph node sharply with a fresh 15 blade.  I obtained over 1.5 cm of lymph node.  The lymph node was placed in saline and passed off the field and sent to Pathology fresh.  The wound was irrigated and hemostasis achieved with electrocautery.  Additional local was infiltrated.  The deep dermis was reapproximated with inverted interrupted 3-0 Vicryl and the skin was reapproximated with running 4-0 Monocryl in a subcuticular fashion.  All needle, instrument, and sponge counts were correct x2.  Dermabond was applied  to the skin incision. There were no immediate complications.  Patient tolerated the procedure well.     Mary Sella. Andrey Campanile, MD, FACS     EMW/MEDQ  D:  06/07/2011  T:  06/08/2011  Job:  [id number]

## 2011-06-09 ENCOUNTER — Encounter (HOSPITAL_COMMUNITY): Payer: Self-pay | Admitting: *Deleted

## 2011-06-09 ENCOUNTER — Inpatient Hospital Stay (HOSPITAL_COMMUNITY)
Admission: AD | Admit: 2011-06-09 | Discharge: 2011-06-11 | DRG: 403 | Disposition: A | Discharge status: Home / Self Care | Payer: BC Managed Care – PPO | Source: Ambulatory Visit | Attending: Oncology | Admitting: Oncology

## 2011-06-09 ENCOUNTER — Ambulatory Visit: Payer: BC Managed Care – PPO | Admitting: Oncology

## 2011-06-09 ENCOUNTER — Telehealth: Payer: Self-pay | Admitting: Oncology

## 2011-06-09 ENCOUNTER — Other Ambulatory Visit: Payer: Self-pay | Admitting: Oncology

## 2011-06-09 ENCOUNTER — Ambulatory Visit: Payer: BC Managed Care – PPO

## 2011-06-09 ENCOUNTER — Encounter: Payer: Self-pay | Admitting: Oncology

## 2011-06-09 ENCOUNTER — Ambulatory Visit (HOSPITAL_BASED_OUTPATIENT_CLINIC_OR_DEPARTMENT_OTHER): Payer: BC Managed Care – PPO | Admitting: Lab

## 2011-06-09 VITALS — BP 119/72 | HR 76 | Temp 97.2°F | Ht 70.0 in | Wt 227.8 lb

## 2011-06-09 DIAGNOSIS — R591 Generalized enlarged lymph nodes: Secondary | ICD-10-CM

## 2011-06-09 DIAGNOSIS — I10 Essential (primary) hypertension: Secondary | ICD-10-CM | POA: Diagnosis present

## 2011-06-09 DIAGNOSIS — N1330 Unspecified hydronephrosis: Secondary | ICD-10-CM | POA: Diagnosis present

## 2011-06-09 DIAGNOSIS — D649 Anemia, unspecified: Secondary | ICD-10-CM

## 2011-06-09 DIAGNOSIS — C8190 Hodgkin lymphoma, unspecified, unspecified site: Principal | ICD-10-CM | POA: Diagnosis present

## 2011-06-09 DIAGNOSIS — E785 Hyperlipidemia, unspecified: Secondary | ICD-10-CM | POA: Diagnosis present

## 2011-06-09 DIAGNOSIS — D72820 Lymphocytosis (symptomatic): Secondary | ICD-10-CM

## 2011-06-09 DIAGNOSIS — D630 Anemia in neoplastic disease: Secondary | ICD-10-CM | POA: Diagnosis present

## 2011-06-09 DIAGNOSIS — R109 Unspecified abdominal pain: Secondary | ICD-10-CM

## 2011-06-09 DIAGNOSIS — E042 Nontoxic multinodular goiter: Secondary | ICD-10-CM

## 2011-06-09 DIAGNOSIS — F3289 Other specified depressive episodes: Secondary | ICD-10-CM | POA: Diagnosis present

## 2011-06-09 DIAGNOSIS — J019 Acute sinusitis, unspecified: Secondary | ICD-10-CM

## 2011-06-09 DIAGNOSIS — N289 Disorder of kidney and ureter, unspecified: Secondary | ICD-10-CM

## 2011-06-09 DIAGNOSIS — I2510 Atherosclerotic heart disease of native coronary artery without angina pectoris: Secondary | ICD-10-CM | POA: Diagnosis present

## 2011-06-09 DIAGNOSIS — F329 Major depressive disorder, single episode, unspecified: Secondary | ICD-10-CM | POA: Diagnosis present

## 2011-06-09 DIAGNOSIS — D72829 Elevated white blood cell count, unspecified: Secondary | ICD-10-CM

## 2011-06-09 DIAGNOSIS — N529 Male erectile dysfunction, unspecified: Secondary | ICD-10-CM

## 2011-06-09 DIAGNOSIS — E049 Nontoxic goiter, unspecified: Secondary | ICD-10-CM | POA: Diagnosis present

## 2011-06-09 DIAGNOSIS — Z8571 Personal history of Hodgkin lymphoma: Secondary | ICD-10-CM | POA: Insufficient documentation

## 2011-06-09 LAB — URINALYSIS, ROUTINE W REFLEX MICROSCOPIC
Bilirubin Urine: NEGATIVE
Glucose, UA: NEGATIVE mg/dL
Ketones, ur: NEGATIVE mg/dL
Specific Gravity, Urine: 1.016 (ref 1.005–1.030)
pH: 6 (ref 5.0–8.0)

## 2011-06-09 LAB — CBC WITH DIFFERENTIAL/PLATELET
Basophils Absolute: 0 10*3/uL (ref 0.0–0.1)
EOS%: 1.3 % (ref 0.0–7.0)
HCT: 33.2 % — ABNORMAL LOW (ref 38.4–49.9)
HGB: 10.4 g/dL — ABNORMAL LOW (ref 13.0–17.1)
MCH: 23.5 pg — ABNORMAL LOW (ref 27.2–33.4)
MCV: 74.9 fL — ABNORMAL LOW (ref 79.3–98.0)
MONO%: 8.1 % (ref 0.0–14.0)
NEUT%: 75.6 % — ABNORMAL HIGH (ref 39.0–75.0)

## 2011-06-09 LAB — URINE MICROSCOPIC-ADD ON

## 2011-06-09 LAB — BASIC METABOLIC PANEL
BUN: 29 mg/dL — ABNORMAL HIGH (ref 6–23)
Chloride: 102 mEq/L (ref 96–112)
Creatinine, Ser: 1.83 mg/dL — ABNORMAL HIGH (ref 0.50–1.35)

## 2011-06-09 MED ORDER — DEXTROSE-NACL 5-0.45 % IV SOLN
INTRAVENOUS | Status: DC
  Administered 2011-06-09 – 2011-06-10 (×2): via INTRAVENOUS

## 2011-06-09 MED ORDER — ONDANSETRON HCL 8 MG PO TABS: 8.0000 mg | ORAL_TABLET | Freq: Two times a day (BID) | ORAL | Status: DC | PRN

## 2011-06-09 MED ORDER — HYDROCODONE-ACETAMINOPHEN 5-325 MG PO TABS
1.0000 | ORAL_TABLET | ORAL | Status: DC | PRN
  Administered 2011-06-10 (×3): 1 via ORAL
  Filled 2011-06-09 (×3): qty 1

## 2011-06-09 MED ORDER — PROCHLORPERAZINE MALEATE 10 MG PO TABS: 10.0000 mg | ORAL_TABLET | Freq: Four times a day (QID) | ORAL | Status: DC | PRN

## 2011-06-09 MED ORDER — DOCUSATE SODIUM 100 MG PO CAPS
100.0000 mg | ORAL_CAPSULE | Freq: Two times a day (BID) | ORAL | Status: DC
  Administered 2011-06-09 – 2011-06-11 (×3): 100 mg via ORAL
  Filled 2011-06-09 (×7): qty 1

## 2011-06-09 MED ORDER — MORPHINE SULFATE 2 MG/ML IJ SOLN
2.0000 mg | INTRAMUSCULAR | Status: DC | PRN
  Administered 2011-06-10: 2 mg via INTRAVENOUS
  Filled 2011-06-09: qty 1

## 2011-06-09 MED ORDER — HYDROCODONE-ACETAMINOPHEN 5-325 MG PO TABS: 1.0000 | ORAL_TABLET | Freq: Four times a day (QID) | ORAL | Status: DC | PRN

## 2011-06-09 MED ORDER — ASPIRIN EC 81 MG PO TBEC
81.0000 mg | DELAYED_RELEASE_TABLET | Freq: Every day | ORAL | Status: DC
  Administered 2011-06-10 – 2011-06-11 (×2): 81 mg via ORAL
  Filled 2011-06-09 (×3): qty 1

## 2011-06-09 MED ORDER — LORAZEPAM 0.5 MG PO TABS: 0.5000 mg | ORAL_TABLET | Freq: Four times a day (QID) | ORAL | Status: DC | PRN

## 2011-06-09 MED ORDER — PANTOPRAZOLE SODIUM 40 MG PO TBEC
40.0000 mg | DELAYED_RELEASE_TABLET | Freq: Every day | ORAL | Status: DC
  Administered 2011-06-10 – 2011-06-11 (×2): 40 mg via ORAL
  Filled 2011-06-09 (×3): qty 1

## 2011-06-09 MED ORDER — ZOLPIDEM TARTRATE 5 MG PO TABS: 5.0000 mg | ORAL_TABLET | Freq: Every evening | ORAL | Status: DC | PRN

## 2011-06-09 MED ORDER — ONDANSETRON HCL 8 MG PO TABS: ORAL_TABLET | ORAL | Status: DC

## 2011-06-09 MED ORDER — ATORVASTATIN CALCIUM 40 MG PO TABS
40.0000 mg | ORAL_TABLET | Freq: Every day | ORAL | Status: DC
  Administered 2011-06-11: 40 mg via ORAL
  Filled 2011-06-09 (×3): qty 1

## 2011-06-09 MED ORDER — HYDROCODONE-ACETAMINOPHEN 5-325 MG PO TABS: 1.0000 | ORAL_TABLET | Freq: Four times a day (QID) | ORAL | Status: AC | PRN

## 2011-06-09 MED ORDER — METOPROLOL SUCCINATE 12.5 MG HALF TABLET
12.5000 mg | ORAL_TABLET | Freq: Every day | ORAL | Status: DC
  Filled 2011-06-09 (×2): qty 1

## 2011-06-09 MED ORDER — LORATADINE 10 MG PO TABS
10.0000 mg | ORAL_TABLET | Freq: Every day | ORAL | Status: DC
  Administered 2011-06-11: 10 mg via ORAL
  Filled 2011-06-09 (×3): qty 1

## 2011-06-09 NOTE — Telephone Encounter (Signed)
called pt and moved lab-md from 03/29 to 03/27 per MD for 2:30pm.  d/t per pt

## 2011-06-09 NOTE — H&P (Signed)
Chief Complaint:  Right flank pain.   HPI:  Devon Brady is a 61 year-old [place] American man with newly diagnosed Hodgkin's lymphoma.    He has history of HTN, HLP, renal insufficiency with baseline Cr of 1.4; benign thyroid nodules. He was in USOH until about a few months ago when he developed nonproductive cough. This cough has no exacerbating or alleviating factors. He denies SOB, DOE, hemoptysis. He also noticed right > left inguinal swelling. He also was noted to have neck swelling. US neck on 05/21/11 showed bilateral thyroid nodules; the largest one was 4.9cm in the left lobe. He was referred for renal US due to renal insuffiencey. On 05/26/2011, renal US showed bilateral hydronephrosis R/L with possible pelvic and retroperitoneal adenopathy. He was evaluated by PCP. On 04/30/2011, WBC 14.2; Hgb 11.3; Plt 479. Given his leukocytosis, he was kindly referred to me on 05/27/2011 for evaluation.  At that presentation, he was noted to have cervical, axilary, and inguinal LAD.  He was referred to Dr. Andrey Campanile who performed excisional biopsy of a right inguinal adenopathy with path case # [id number] consistent with classical Hodgkin's lymphoma, nodular sclerosis type (CD15+, CD30+; rare CD20+; LCA negative; CD3 negative).  He see seen urgently at the [facility] today for evaluation to go over treatment option.  He presented to the [facility] today with his wife and daughter.  He reported that he has been having worsening right flank pain.  Pain is very crampy, worsened with sitting, better with standing up, only slightly improved with Tylenol from 8/10 down to maybe 5/10, no radiation to the groin, no dysuria, hematuria, pyuria.  His cough has improved since he last saw me.  He still has fatigue; however, he is independent of all activities of daily living and chores.  He still notices cervical adenopathy but no other adenopathy.  His excisional wound from his right groin has healed nicely.     Patient denies headache, visual changes, confusion, drenching night sweats, mucositis, odynophagia, dysphagia, nausea vomiting, jaundice, chest pain, palpitation, shortness of breath, dyspnea on exertion, productive cough, gum bleeding, epistaxis, hematemesis, hemoptysis, abdominal pain, abdominal swelling, early satiety, melena, hematochezia, hematuria, skin rash, spontaneous bleeding, joint swelling, joint pain, heat or cold intolerance, bowel bladder incontinence, focal motor weakness, paresthesia, depression, suicidal or homocidal ideation, feeling hopelessness.                Past Medical History  Diagnosis Date  . Hypertension   . Hyperlipidemia   . CAD (coronary artery disease)     s/p 5V CABG 8/11  . RCA occlusion   . Multiple thyroid nodules   . Anemia   . Lymphocytosis   . Lymphadenopathy   . Hydronephrosis   . Renal insufficiency   . Complication of anesthesia     slow to wake up  . Dizziness     occasional for last week  . Hemorrhoid   . Sleep apnea     STOPBANG=5  . Hodgkin's lymphoma    Past Surgical History  Procedure Date  . Vasectomy 1980  . Bunionectomy 1978    right foot  . Cardiac catheterization 10/15/2009  . Coronary artery bypass graft 10/17/2009    LIMA to LAD,SVG to Ramus,SVG to OM sequential to OM2, SVG to marginal of RCA  . Tonsillectomy as child   Family History  Problem Relation Age of Onset  . Alcohol abuse    . Arthritis    . Diabetes Father   . Coronary  artery disease Father 68    dscd  . Kidney disease    . Prostate cancer    . Hypertension Mother   . Hyperlipidemia     Social History:  reports that he has never smoked. He has never used smokeless tobacco. He reports that he drinks about .6 ounces of alcohol per week. He reports that he does not use illicit drugs.  Allergies: No Known Allergies Medications Prior to Admission  Medication Sig Dispense Refill  . acetaminophen (TYLENOL) 500 MG tablet Take 1,000 mg by mouth every 6  (six) hours as needed. Pain       . aspirin 81 MG tablet Take 81 mg by mouth.       . cetirizine (ZYRTEC) 10 MG tablet Take 10 mg by mouth daily.      . Coenzyme Q10 (COQ-10) 200 MG CAPS Take 1 capsule by mouth daily.      . fluticasone (FLONASE) 50 MCG/ACT nasal spray Place 2 sprays into the nose as needed. Allergies       . Iron-Vitamins (S.S.S. TONIC PO) Take 30 mLs by mouth daily.      . metoprolol succinate (TOPROL-XL) 50 MG 24 hr tablet       . Multiple Vitamins-Iron (MULTIVITAMINS WITH IRON) TABS Take 1 tablet by mouth daily.      . Omega-3 Fatty Acids (FISH OIL) 1000 MG CAPS Take 1 capsule by mouth 3 (three) times daily with meals.       Marland Kitchen omeprazole (PRILOSEC) 40 MG capsule Take 40 mg by mouth every morning.       . rosuvastatin (CRESTOR) 20 MG tablet Take 20 mg by mouth every evening.      . terbinafine (LAMISIL) 250 MG tablet Take 250 mg by mouth daily.      Marland Kitchen oxyCODONE-acetaminophen (ROXICET) 5-325 MG per tablet Take 1 tablet by mouth every 4 (four) hours as needed for pain.  30 tablet  0  . DISCONTD: citalopram (CELEXA) 20 MG tablet Take 1 tablet (20 mg total) by mouth daily.  30 tablet  1  . DISCONTD: omeprazole (PRILOSEC OTC) 20 MG tablet Take 1 tablet (20 mg total) by mouth daily.  28 tablet  1   No current facility-administered medications on file as of 06/09/2011.   Results for orders placed in visit on 06/09/11 (from the past 48 hour(s))  CBC WITH DIFFERENTIAL     Status: Abnormal   Collection Time   06/09/11  2:37 PM      Component Value Range Comment   WBC 13.3 (*) 4.0 - 10.3 (10e3/uL)    NEUT# 10.1 (*) 1.5 - 6.5 (10e3/uL)    HGB 10.4 (*) 13.0 - 17.1 (g/dL)    HCT 08.6 (*) 57.8 - 49.9 (%)    Platelets 451 (*) 140 - 400 (10e3/uL)    MCV 74.9 (*) 79.3 - 98.0 (fL)    MCH 23.5 (*) 27.2 - 33.4 (pg)    MCHC 31.3 (*) 32.0 - 36.0 (g/dL)    RBC 4.69  6.29 - 5.28 (10e6/uL)    RDW 19.8 (*) 11.0 - 14.6 (%)    lymph# 2.0  0.9 - 3.3 (10e3/uL)    MONO# 1.1 (*) 0.1 - 0.9  (10e3/uL)    Eosinophils Absolute 0.2  0.0 - 0.5 (10e3/uL)    Basophils Absolute 0.0  0.0 - 0.1 (10e3/uL)    NEUT% 75.6 (*) 39.0 - 75.0 (%)    LYMPH% 14.8  14.0 - 49.0 (%)  MONO% 8.1  0.0 - 14.0 (%)    EOS% 1.3  0.0 - 7.0 (%)    BASO% 0.2  0.0 - 2.0 (%)     Pertinent items are noted in HPI.   Blood pressure 119/72, pulse 76, temperature 97.2 F (36.2 C), temperature source Oral, height 5\' 10"  (1.778 m), weight 227 lb 12.8 oz (103.329 kg).  PHYSICAL EXAM:   General: well-nourished, no respiratory distress.  He was in moderate lower back pain.   Eyes: no scleral icterus. ENT: There were no oropharyngeal lesions. Neck exam showed bilateral thyromegaly. Lymphatics: positive for left supraclavicular node about 1cm; right cervical level II about 2cm; bilateral inguinal adenopathy of about 2-3cm. Respiratory: lungs were clear bilaterally without wheezing or crackles. Cardiovascular: Regular rate and rhythm, S1/S2, without murmur, rub or gallop. There was no pedal edema. GI: abdomen was soft, flat, nontender, nondistended, without organomegaly. Muscoloskeletal: no spinal tenderness of palpation of vertebral spine. I could not palpate any right flank mass.  Skin exam was without echymosis, petichae. Neuro exam was nonfocal. Patient was able to get on and off exam table without assistance. Gait was normal. Patient was alerted and oriented. Attention was good. Language was appropriate. Mood was normal without depression. Speech was not pressured. Thought content was not tangential.     Assessment/Plan   1.  Newly diagnosed Hodgkin's lymphoma: Staging:  At least stage III given both above and below diaphragm involvement.  I recommended staging with PET and bone marrow biopsy.  [facility] PET scan is down until next week.  I cannot wait longer given his hydronephrosis and moderate/severe back pain.  I would like to go ahead with chemo and then obtain PET scan ASAP next week.  Prognosis:  Pending complete  staging PET scan.  However, so far, he has IPS of 5 (alb <4; Hgb <10.5; Male, age >79; WBC >15K).  His 5 year overall survival is about 60-70%.    Treatment:  We discussed the standard of care in the [place] being ABVD (adriamycin, bleomycin, vinblastine, dacarbazine).  This chemotherapy is better tolerated than BEACOPP.  BEACOPP may have better response rate than ABVD; however, it has much more chance of infection and other complications.  We recommend ABVD and repeat PET scan after 2 full cycles.  If there is less than a robust response, then we may consider switching to BEACOPP at that time.  I discussed with patient and his family potential side effects of ABVD which include but not limited to fatigue, alopecia, mucositis, nausea/vomiting, cardiomyopathy, pneumonitis, 2nd malignancy such as MDS/leukemia, neuropathy, cytopenia, infection, bleeding, skin rash.    Mr. Yankowski expressed informed understanding and wished to proceed with chemo ASAP given his hydronephrosis and right lower back pain from presumed hydronephrosis.  I discussed with patient and family that there is no role for upfront autologous BMT unless he has relapse.    In preparation for chemo, I admit patient today for expedited work up and start of chemo.  I consulted General Surgery (pt had seen Dr. Gaynelle Adu in the past) for portacath placement for chemo access.  I requested 2D echo without contrast to assess baseline cardiac function.  I requested PFT baseline.  Chemo hopefully will be able to start late tomorrow or at the latest Friday 06/11/11.  2.  Right flank pain:   Ddx:  Lymphoma.  Recent CT staging did not show other etiology.  I will send for UA to rule out UTI.  There was low clinical concern  for cord compression per CT work up.   Rx:  Hopefully his symptoms will improve with chemo.  I will prescribe Lortab prn moderate pain and morphine for severe pain.  I placed him on bowel regimen.  3.  Thyromegaly with thyroid nodule:    Ddx: thyroid involvement of Hodgkin's dz (rare presentation) vs thyroid cancer.   Work up:  I will obtain US-guided thyroid biopsy to ensure that I'm not missing another diagnosis.   4. Leukocytosis:  - Ddx: Concerning for lymphoproliferative process as #1.  Pending diagnostic staging bone marrow biopsy.  There is no sign of focal infection.   5. Hydronophrosis: Due to obstructive adenopathy. Work up with CT noncontrast. Treating underlying dz. I will continue to monitor his Cr and consider stents in the future if his Cr worsens.  As lymphoma respond rather briskly to chemo, and placement of nephrostomy will expose him to risk of infection, I prefer to wait on nephrostomy unless his Cr worsens.   6. Anemia: Most likely due to chronic inflammation vs chronic renal insufficiency. His his Hgb in the past was normal in 2011, it is unlikely that he has hemoglobinopathy. Anemia work up 2 wks ago was negative.  This is most likely due to lymphoma.  Will follow CBC.   7. HTN: will resume Toprol per PCP.   8. HLP: will resume Crestor per PCP.   FULL CODE.   The length of time of the face-to-face encounter was 60 minutes. More than 50% of time was spent counseling and coordination of care.      Lokelani Lutes 06/09/2011, 5:45 PM

## 2011-06-09 NOTE — Progress Notes (Signed)
Pt. Is resting. Wife is with him. Just ate. To have scheduled procedure tomorrow. Appears to be in good spirits, and anticipates discharge by Friday.  IV to left hand/arm attempted w/o success. IV team called.  Barth Kirks 9:37 PM

## 2011-06-09 NOTE — Progress Notes (Signed)
Received lymph node biopsy; forwarded to Dr. Gaylyn Rong.

## 2011-06-10 ENCOUNTER — Inpatient Hospital Stay (HOSPITAL_COMMUNITY): Payer: BC Managed Care – PPO

## 2011-06-10 ENCOUNTER — Encounter: Payer: Self-pay | Admitting: *Deleted

## 2011-06-10 ENCOUNTER — Encounter (HOSPITAL_COMMUNITY)
Admission: AD | Disposition: A | Discharge status: Home / Self Care | Payer: Self-pay | Source: Ambulatory Visit | Attending: Oncology

## 2011-06-10 ENCOUNTER — Encounter (HOSPITAL_COMMUNITY): Payer: Self-pay | Admitting: Anesthesiology

## 2011-06-10 ENCOUNTER — Encounter: Payer: Self-pay | Admitting: Oncology

## 2011-06-10 ENCOUNTER — Inpatient Hospital Stay (HOSPITAL_COMMUNITY): Payer: BC Managed Care – PPO | Admitting: Anesthesiology

## 2011-06-10 HISTORY — PX: PORTACATH PLACEMENT: SHX2246

## 2011-06-10 LAB — BASIC METABOLIC PANEL
CO2: 25 mEq/L (ref 19–32)
GFR calc non Af Amer: 45 mL/min — ABNORMAL LOW (ref >90)
Glucose, Bld: 104 mg/dL — ABNORMAL HIGH (ref 70–99)
Potassium: 3.8 mEq/L (ref 3.5–5.1)
Sodium: 138 mEq/L (ref 135–145)

## 2011-06-10 LAB — CBC
Hemoglobin: 9.8 g/dL — ABNORMAL LOW (ref 13.0–17.0)
MCH: 22.7 pg — ABNORMAL LOW (ref 26.0–34.0)
MCV: 74.2 fL — ABNORMAL LOW (ref 78.0–100.0)
Platelets: 369 10*3/uL (ref 150–400)
RBC: 4.31 MIL/uL (ref 4.22–5.81)
WBC: 12.2 10*3/uL — ABNORMAL HIGH (ref 4.0–10.5)

## 2011-06-10 LAB — APTT: aPTT: 32 seconds (ref 24–37)

## 2011-06-10 LAB — PROTIME-INR: Prothrombin Time: 14.2 seconds (ref 11.6–15.2)

## 2011-06-10 SURGERY — INSERTION, TUNNELED CENTRAL VENOUS DEVICE, WITH PORT
Anesthesia: General | Laterality: Left | Wound class: Clean

## 2011-06-10 MED ORDER — ALBUTEROL SULFATE (5 MG/ML) 0.5% IN NEBU
2.5000 mg | INHALATION_SOLUTION | Freq: Once | RESPIRATORY_TRACT | Status: AC
  Administered 2011-06-10: 2.5 mg via RESPIRATORY_TRACT

## 2011-06-10 MED ORDER — 0.9 % SODIUM CHLORIDE (POUR BTL) OPTIME
TOPICAL | Status: DC | PRN
  Administered 2011-06-10: 1000 mL

## 2011-06-10 MED ORDER — MIDAZOLAM HCL 5 MG/5ML IJ SOLN
INTRAMUSCULAR | Status: DC | PRN
  Administered 2011-06-10: 2 mg via INTRAVENOUS

## 2011-06-10 MED ORDER — ONDANSETRON HCL 4 MG/2ML IJ SOLN
INTRAMUSCULAR | Status: DC | PRN
  Administered 2011-06-10: 4 mg via INTRAVENOUS

## 2011-06-10 MED ORDER — SODIUM CHLORIDE 0.9 % IR SOLN
Status: DC | PRN
  Administered 2011-06-10: 12:00:00

## 2011-06-10 MED ORDER — DEXAMETHASONE SODIUM PHOSPHATE 10 MG/ML IJ SOLN
INTRAMUSCULAR | Status: DC | PRN
  Administered 2011-06-10: 10 mg via INTRAVENOUS

## 2011-06-10 MED ORDER — OXYCODONE-ACETAMINOPHEN 5-325 MG PO TABS: 1.0000 | ORAL_TABLET | ORAL | Status: DC | PRN

## 2011-06-10 MED ORDER — EPHEDRINE SULFATE 50 MG/ML IJ SOLN
INTRAMUSCULAR | Status: DC | PRN
  Administered 2011-06-10: 10 mg via INTRAVENOUS

## 2011-06-10 MED ORDER — LACTATED RINGERS IV SOLN
INTRAVENOUS | Status: DC
  Administered 2011-06-10: 1000 mL via INTRAVENOUS

## 2011-06-10 MED ORDER — CEFAZOLIN SODIUM 1-5 GM-% IV SOLN
1.0000 g | Freq: Once | INTRAVENOUS | Status: AC
  Administered 2011-06-10: 2 g via INTRAVENOUS
  Filled 2011-06-10: qty 50

## 2011-06-10 MED ORDER — FENTANYL CITRATE 0.05 MG/ML IJ SOLN: 25.0000 ug | INTRAMUSCULAR | Status: DC | PRN

## 2011-06-10 MED ORDER — FENTANYL CITRATE 0.05 MG/ML IJ SOLN
INTRAMUSCULAR | Status: DC | PRN
  Administered 2011-06-10 (×4): 50 ug via INTRAVENOUS

## 2011-06-10 MED ORDER — LACTATED RINGERS IV SOLN
INTRAVENOUS | Status: DC | PRN
  Administered 2011-06-10: 11:00:00 via INTRAVENOUS

## 2011-06-10 MED ORDER — SODIUM CHLORIDE 0.9 % IR SOLN
Freq: Once | Status: DC
  Filled 2011-06-10: qty 1.2

## 2011-06-10 MED ORDER — BUPIVACAINE LIPOSOME 1.3 % IJ SUSP
20.0000 mL | INTRAMUSCULAR | Status: AC
  Administered 2011-06-10: 10 mL
  Administered 2011-06-10: 266 mg
  Filled 2011-06-10: qty 20

## 2011-06-10 MED ORDER — LIDOCAINE HCL (CARDIAC) 20 MG/ML IV SOLN
INTRAVENOUS | Status: DC | PRN
  Administered 2011-06-10: 100 mg via INTRAVENOUS

## 2011-06-10 MED ORDER — HEPARIN SOD (PORK) LOCK FLUSH 100 UNIT/ML IV SOLN
INTRAVENOUS | Status: DC | PRN
  Administered 2011-06-10: 500 [IU]

## 2011-06-10 MED ORDER — PROPOFOL 10 MG/ML IV EMUL
INTRAVENOUS | Status: DC | PRN
  Administered 2011-06-10: 150 mg via INTRAVENOUS

## 2011-06-10 SURGICAL SUPPLY — 37 items
BLADE HEX COATED 2.75 (ELECTRODE) ×2 IMPLANT
BLADE SURG 15 STRL LF DISP TIS (BLADE) ×1 IMPLANT
BLADE SURG 15 STRL SS (BLADE) ×1
BLADE SURG SZ11 CARB STEEL (BLADE) ×2 IMPLANT
CANISTER SUCTION 2500CC (MISCELLANEOUS) ×2 IMPLANT
CHLORAPREP W/TINT 26ML (MISCELLANEOUS) ×2 IMPLANT
CLOTH BEACON ORANGE TIMEOUT ST (SAFETY) ×2 IMPLANT
DECANTER SPIKE VIAL GLASS SM (MISCELLANEOUS) ×2 IMPLANT
DERMABOND ADVANCED (GAUZE/BANDAGES/DRESSINGS) ×1
DERMABOND ADVANCED .7 DNX12 (GAUZE/BANDAGES/DRESSINGS) ×1 IMPLANT
DRAPE C-ARM 42X72 X-RAY (DRAPES) ×2 IMPLANT
DRSG TEGADERM 4X4.75 (GAUZE/BANDAGES/DRESSINGS) ×2 IMPLANT
ELECT REM PT RETURN 9FT ADLT (ELECTROSURGICAL) ×2
ELECTRODE REM PT RTRN 9FT ADLT (ELECTROSURGICAL) ×1 IMPLANT
GAUZE SPONGE 4X4 16PLY XRAY LF (GAUZE/BANDAGES/DRESSINGS) ×2 IMPLANT
GLOVE BIO SURGEON STRL SZ 6 (GLOVE) ×4 IMPLANT
GLOVE BIOGEL PI IND STRL 6.5 (GLOVE) ×1 IMPLANT
GLOVE BIOGEL PI IND STRL 7.0 (GLOVE) ×1 IMPLANT
GLOVE BIOGEL PI INDICATOR 6.5 (GLOVE) ×1
GLOVE BIOGEL PI INDICATOR 7.0 (GLOVE) ×1
GLOVE INDICATOR 6.5 STRL GRN (GLOVE) ×4 IMPLANT
GOWN PREVENTION PLUS XXLARGE (GOWN DISPOSABLE) ×2 IMPLANT
KIT BASIN OR (CUSTOM PROCEDURE TRAY) ×2 IMPLANT
KIT POWER CATH 8FR (Catheter) ×2 IMPLANT
NEEDLE HYPO 22GX1.5 SAFETY (NEEDLE) ×2 IMPLANT
NEEDLE HYPO 25X1 1.5 SAFETY (NEEDLE) IMPLANT
PACK UNIVERSAL I (CUSTOM PROCEDURE TRAY) ×2 IMPLANT
PENCIL BUTTON HOLSTER BLD 10FT (ELECTRODE) ×2 IMPLANT
POWER PORT MRI Attachable ×2 IMPLANT
SUT MNCRL AB 4-0 PS2 18 (SUTURE) ×2 IMPLANT
SUT PROLENE 2 0 SH DA (SUTURE) ×4 IMPLANT
SUT VIC AB 3-0 SH 27 (SUTURE) ×1
SUT VIC AB 3-0 SH 27XBRD (SUTURE) ×1 IMPLANT
SYR BULB IRRIGATION 50ML (SYRINGE) IMPLANT
SYR CONTROL 10ML LL (SYRINGE) ×2 IMPLANT
TOWEL OR 17X26 10 PK STRL BLUE (TOWEL DISPOSABLE) ×2 IMPLANT
YANKAUER SUCT BULB TIP 10FT TU (MISCELLANEOUS) IMPLANT

## 2011-06-10 NOTE — Progress Notes (Signed)
See Admit H&P on 06/09/11

## 2011-06-10 NOTE — Progress Notes (Signed)
RECEIVED A FAX FROM [facility] CONCERNING A PRIOR AUTHORIZATION FOR ONDANSETRON. THIS REQUEST WAS PLACED IN THE MANAGED CARE BIN FOR EBONY. 

## 2011-06-10 NOTE — Progress Notes (Signed)
[facility] INPATIENT PROGRESS NOTE  Name: Devon Brady      MRN: [id number]    Location: 1306/1306-01  Date: 06/10/2011 Time:7:55 AM   Subjective: Interval History:Devon Brady reported that right lower back pain is feeling better.  He did not have much response to IV morphine, but has good relief with Vicodin.  He denies lower extremity weakness, paresthesia, bowel/bladder incontinence.  He denies SOB, CP, cough, abdominal pain, nausea/vomiting, bleeding sx.    Objective: Vital signs in last 24 hours: Temp:  [97.2 F (36.2 C)-98.4 F (36.9 C)] 98.3 F (36.8 C) (03/28 0600) Pulse Rate:  [76-83] 83  (03/28 0600) Resp:  [12-18] 18  (03/28 0600) BP: (102-147)/(60-91) 147/91 mmHg (03/28 0600) SpO2:  [99 %] 99 % (03/28 0600) Weight:  [227 lb (102.967 kg)-227 lb 12.8 oz (103.329 kg)] 227 lb (102.967 kg) (03/27 1831)    Intake/Output from previous day: 03/27 0701 - 03/28 0700 In: -  Out: 1100 [Urine:1100]     PHYSICAL EXAM:  General: well-nourished, no respiratory distress. He was in moderate lower back pain. Eyes: no scleral icterus. ENT: There were no oropharyngeal lesions. Neck exam showed bilateral thyromegaly. Lymphatics: positive for left supraclavicular node about 1cm; right cervical level II about 2cm; bilateral inguinal adenopathy of about 2-3cm. Respiratory: lungs were clear bilaterally without wheezing or crackles. Cardiovascular: Regular rate and rhythm, S1/S2, without murmur, rub or gallop. There was no pedal edema. GI: abdomen was soft, flat, nontender, nondistended, without organomegaly. Muscoloskeletal: no spinal tenderness of palpation of vertebral spine. I could not palpate any right flank mass, nor was there pain on percussion today. Skin exam was without echymosis, petichae.  Studies/Results: Results for orders placed during the hospital encounter of 06/09/11 (from the past 48 hour(s))  URINALYSIS, ROUTINE W REFLEX MICROSCOPIC     Status:  Abnormal   Collection Time   06/09/11  7:59 PM      Component Value Range Comment   Color, Urine YELLOW  YELLOW     APPearance CLEAR  CLEAR     Specific Gravity, Urine 1.016  1.005 - 1.030     pH 6.0  5.0 - 8.0     Glucose, UA NEGATIVE  NEGATIVE (mg/dL)    Hgb urine dipstick NEGATIVE  NEGATIVE     Bilirubin Urine NEGATIVE  NEGATIVE     Ketones, ur NEGATIVE  NEGATIVE (mg/dL)    Protein, ur NEGATIVE  NEGATIVE (mg/dL)    Urobilinogen, UA 0.2  0.0 - 1.0 (mg/dL)    Nitrite NEGATIVE  NEGATIVE     Leukocytes, UA TRACE (*) NEGATIVE    URINE MICROSCOPIC-ADD ON     Status: Abnormal   Collection Time   06/09/11  7:59 PM      Component Value Range Comment   Squamous Epithelial / LPF FEW (*) RARE     WBC, UA 7-10  <3 (WBC/hpf)    RBC / HPF 3-6  <3 (RBC/hpf)    Bacteria, UA MANY (*) RARE     Sperm, UA PRESENT      Urine-Other MUCOUS PRESENT     SEDIMENTATION RATE     Status: Abnormal   Collection Time   06/10/11  3:52 AM      Component Value Range Comment   Sed Rate 82 (*) 0 - 16 (mm/hr)   APTT     Status: Normal   Collection Time   06/10/11  3:52 AM      Component Value Range Comment  aPTT 32  24 - 37 (seconds)   BASIC METABOLIC PANEL     Status: Abnormal   Collection Time   06/10/11  3:52 AM      Component Value Range Comment   Sodium 138  135 - 145 (mEq/L)    Potassium 3.8  3.5 - 5.1 (mEq/L)    Chloride 104  96 - 112 (mEq/L)    CO2 25  19 - 32 (mEq/L)    Glucose, Bld 104 (*) 70 - 99 (mg/dL)    BUN 26 (*) 6 - 23 (mg/dL)    Creatinine, Ser 4.54 (*) 0.50 - 1.35 (mg/dL)    Calcium 9.3  8.4 - 10.5 (mg/dL)    GFR calc non Af Amer 45 (*) >90 (mL/min)    GFR calc Af Amer 52 (*) >90 (mL/min)   CBC     Status: Abnormal   Collection Time   06/10/11  3:52 AM      Component Value Range Comment   WBC 12.2 (*) 4.0 - 10.5 (K/uL)    RBC 4.31  4.22 - 5.81 (MIL/uL)    Hemoglobin 9.8 (*) 13.0 - 17.0 (g/dL)    HCT 09.8 (*) 11.9 - 52.0 (%)    MCV 74.2 (*) 78.0 - 100.0 (fL)    MCH 22.7 (*) 26.0 -  34.0 (pg)    MCHC 30.6  30.0 - 36.0 (g/dL)    RDW 14.7 (*) 82.9 - 15.5 (%)    Platelets 369  150 - 400 (K/uL)   PROTIME-INR     Status: Normal   Collection Time   06/10/11  3:52 AM      Component Value Range Comment   Prothrombin Time 14.2  11.6 - 15.2 (seconds)    INR 1.08  0.00 - 1.49     No results found.   MEDICATIONS:  Reviewed.     Assessment/Plan:   1. Newly diagnosed Hodgkin's lymphoma:   Staging: At least stage III given both above and below diaphragm involvement. Pending PET scan and bone marrow biopsy.   Treatment: pending ABVD after preparative studies. Chemo hopefully will be able to start tomorrow Friday 06/11/11.   In preparation for chemo:  I consulted General Surgery (pt had seen Dr. Gaynelle Adu in the past) for portacath placement for chemo access. I requested 2D echo without contrast to assess baseline cardiac function. I requested PFT baseline.   2. Right flank pain:  Ddx: Lymphoma. Recent CT staging did not show other etiology.  Rx: His UA did not show blood (some bacteria present, I'll send for urine culture).  Hopefully his symptoms will improve with chemo.  He had good relief with Vicodin.  He is on bowel regimen.   3. Thyromegaly with thyroid nodule:  Ddx: thyroid involvement of Hodgkin's dz (rare presentation) vs thyroid cancer.  Work up: I will obtain US-guided thyroid biopsy of a dominant thyroid nodule to ensure that I'm not missing another diagnosis.   4. Leukocytosis:  - Ddx: Concerning for lymphoproliferative process as #1. Pending diagnostic staging bone marrow biopsy. There is no sign of focal infection.   5. Hydronophrosis:  Cr today is slightly improved with IVF.  Hopefully will get better after chemo.   6. Anemia: Most likely due to chronic inflammation vs chronic renal insufficiency. His his Hgb in the past was normal in 2011, it is unlikely that he has hemoglobinopathy. Anemia work up 2 wks ago was negative. This is most likely due to  lymphoma. Will follow CBC.  7. HTN: will resume Toprol per PCP.   8. HLP: will resume Crestor per PCP.   FULL CODE.

## 2011-06-10 NOTE — Progress Notes (Signed)
Called [facility] @ [phone], ondansetron 8mg  30 tabs has been approved from 06/10/11-06/09/12.

## 2011-06-10 NOTE — Preoperative (Signed)
Beta Blockers   Reason not to administer Beta Blockers:Hold beta blocker due to other Pt took Metoprolol 06-09-11. Beta Blocker held prior to surgery due to possible hypotension/hemodynamic instability.

## 2011-06-10 NOTE — Progress Notes (Signed)
  Subjective: Pt known to our service.  Dr. Andrey Campanile did lymph node biopsy last week that was positive for hodgkin's lymphoma.  We are requested to place port for urgent chemotherapy needs.    Objective: Vital signs in last 24 hours: Temp:  [97.2 F (36.2 C)-98.4 F (36.9 C)] 98.3 F (36.8 C) (03/28 0600) Pulse Rate:  [76-83] 83  (03/28 0600) Resp:  [12-18] 18  (03/28 0600) BP: (102-147)/(60-91) 147/91 mmHg (03/28 0600) SpO2:  [99 %] 99 % (03/28 0600) Weight:  [227 lb (102.967 kg)-227 lb 12.8 oz (103.329 kg)] 227 lb (102.967 kg) (03/27 1831) Last BM Date: 06/09/11  Intake/Output from previous day: 03/27 0701 - 03/28 0700 In: -  Out: 1100 [Urine:1100] Intake/Output this shift:    General appearance: alert, cooperative and no distress Chest wall: no tenderness, Clavicles easily palpable.  No prior scars from lines. GI: soft, non-tender; bowel sounds normal; no masses,  no organomegaly  Lab Results:   Basename 06/10/11 0352 06/09/11 1437  WBC 12.2* 13.3*  HGB 9.8* 10.4*  HCT 32.0* 33.2*  PLT 369 451*   BMET  Basename 06/10/11 0352 06/09/11 1438  NA 138 138  K 3.8 4.4  CL 104 102  CO2 25 27  GLUCOSE 104* 102*  BUN 26* 29*  CREATININE 1.63* 1.83*  CALCIUM 9.3 9.0   PT/INR  Basename 06/10/11 0352  LABPROT 14.2  INR 1.08   ABG No results found for this basename: PHART:2,PCO2:2,PO2:2,HCO3:2 in the last 72 hours  Studies/Results: No results found.  Anti-infectives: Anti-infectives    None      Assessment/Plan: s/p Procedure(s) (LRB): INSERTION PORT-A-CATH (N/A) NPO, antibiotics on call to OR.  Consent for port Reviewed risks and benefits as well as surgical procedure.   Discussed bleeding, infection, malfunction, and pneumothorax.   LOS: 1 day    [facility] 06/10/2011

## 2011-06-10 NOTE — Op Note (Signed)
PREOPERATIVE DIAGNOSIS:  Hodgkin's lymphoma     POSTOPERATIVE DIAGNOSIS:  Same     PROCEDURE: L [place] subclavian ultrasound-guided port placement, Bard   Power Port, MRI safe, 8-French.      SURGEON:  Almond Lint, MD      ANESTHESIA:  General   FINDINGS:  Good venous return, easy flush, and tip of the catheter and   SVC Total 21.5 cm catheter length, but catheter cut at end at 23.5 cm, then 2 cm cut off proximal end.      SPECIMEN:  None.      ESTIMATED BLOOD LOSS:  Minimal.      COMPLICATIONS:  None known.      PROCEDURE:  Pt was identified in the holding area and taken to   the operating room, where patient was placed supine on the operating room   table.  General anesthesia with LMA was induced.  Patient's arms were tucked and the upper   chest and neck were prepped and draped in sterile fashion.  Time-out was   performed according to the surgical safety check list.  When all was   correct, we continued.   Local anesthetic was administered over this   area at the angle of the clavicle.  The vein was accessed with 2 passes of the needle. There was good venous return and the wire passed easily with no ectopy.   Fluoroscopy was used to confirm that the wire was in the vena cava.      The patient was placed back level and the area for the pocket was anethetized   with local anesthetic.  A 3-cm transverse incision was made with a #15   blade.  Cautery was used to divide the subcutaneous tissues down to the   pectoralis muscle.  An [employer] retractor was used to elevate the skin   while a pocket was created on top of the pectoralis fascia.  The port   was placed into the pocket to confirm that it was of adequate size.  The   catheter was preattached to the port.  The port was then secured to the   pectoralis fascia with four 2-0 Prolene sutures.  These were clamped and   not tied down yet.    The catheter was tunneled through to the wire exit   site.  The catheter was placed along  the wire to determine what length it should be to be in the SVC.  The catheter was cut at 23.5 cm.  The tunneler sheath and dilator were passed over the wire and the dilator and wire were removed.  The catheter was advanced through the tunneler sheath and the tunneler sheath was pulled away.  Care was taken to keep the catheter in the tunneler sheath as this occurred. This was advanced and the tunneler sheath was removed.  There was good venous   return and easy flush of the catheter.  The catheter appeared to be in too far, so 2 cm were cut off the proximal end at the port.  The Prolene sutures were tied  down to the pectoral fascia.  The skin was reapproximated using 3-0  Vicryl interrupted deep dermal sutures.    Fluoroscopy was used to re-confirm good position of the catheter.  The skin   was then closed using 4-0 Monocryl in a subcuticular fashion.  The port was flushed with concentrated heparin flush as well.  The wounds were then cleaned, dried, and dressed with Dermabond.  The  patient was awakened from anesthesia and taken to the PACU in stable condition.  Needle, sponge, and instrument counts were correct.               [name], MD

## 2011-06-10 NOTE — Anesthesia Preprocedure Evaluation (Addendum)
Anesthesia Evaluation    Airway Mallampati: II TM Distance: >3 FB Neck ROM: Full    Dental No notable dental hx.    Pulmonary sleep apnea ,  breath sounds clear to auscultation  Pulmonary exam normal       Cardiovascular hypertension, Pt. on home beta blockers + CAD Rhythm:Regular Rate:Normal     Neuro/Psych PSYCHIATRIC DISORDERS Depression    GI/Hepatic   Endo/Other    Renal/GU      Musculoskeletal   Abdominal   Peds  Hematology   Anesthesia Other Findings   Reproductive/Obstetrics                          Anesthesia Physical Anesthesia Plan  ASA: III  Anesthesia Plan: General   Post-op Pain Management:    Induction: Intravenous  Airway Management Planned: LMA  Additional Equipment:   Intra-op Plan:   Post-operative Plan: Extubation in OR  Informed Consent: I have reviewed the patients History and Physical, chart, labs and discussed the procedure including the risks, benefits and alternatives for the proposed anesthesia with the patient or authorized representative who has indicated his/her understanding and acceptance.   Dental advisory given  Plan Discussed with: CRNA  Anesthesia Plan Comments:        Anesthesia Quick Evaluation

## 2011-06-10 NOTE — Transfer of Care (Signed)
Immediate Anesthesia Transfer of Care Note  Patient: Devon Brady  Procedure(s) Performed: Procedure(s) (LRB): INSERTION PORT-A-CATH (Left)  Patient Location: PACU  Anesthesia Type: General  Level of Consciousness: sedated, patient cooperative and responds to stimulaton  Airway & Oxygen Therapy: Patient Spontanous Breathing and Patient connected to face mask oxgen  Post-op Assessment: Report given to PACU RN and Post -op Vital signs reviewed and stable  Post vital signs: Reviewed and stable  Complications: No apparent anesthesia complications

## 2011-06-10 NOTE — Anesthesia Postprocedure Evaluation (Signed)
  Anesthesia Post-op Note  Patient: Devon Brady  Procedure(s) Performed: Procedure(s) (LRB): INSERTION PORT-A-CATH (Left)  Patient Location: PACU  Anesthesia Type: General  Level of Consciousness: awake and alert   Airway and Oxygen Therapy: Patient Spontanous Breathing  Post-op Pain: mild  Post-op Assessment: Post-op Vital signs reviewed, Patient's Cardiovascular Status Stable, Respiratory Function Stable, Patent Airway and No signs of Nausea or vomiting  Post-op Vital Signs: stable  Complications: No apparent anesthesia complications

## 2011-06-11 ENCOUNTER — Encounter (HOSPITAL_COMMUNITY): Payer: Self-pay | Admitting: General Surgery

## 2011-06-11 ENCOUNTER — Other Ambulatory Visit: Payer: Self-pay | Admitting: Oncology

## 2011-06-11 ENCOUNTER — Ambulatory Visit: Payer: BC Managed Care – PPO | Admitting: Oncology

## 2011-06-11 ENCOUNTER — Other Ambulatory Visit: Payer: Self-pay | Admitting: Family Medicine

## 2011-06-11 ENCOUNTER — Inpatient Hospital Stay (HOSPITAL_COMMUNITY): Admission: RE | Admit: 2011-06-11 | Payer: Self-pay | Source: Ambulatory Visit

## 2011-06-11 ENCOUNTER — Other Ambulatory Visit: Payer: BC Managed Care – PPO | Admitting: Lab

## 2011-06-11 ENCOUNTER — Inpatient Hospital Stay (HOSPITAL_COMMUNITY): Payer: BC Managed Care – PPO

## 2011-06-11 ENCOUNTER — Ambulatory Visit: Payer: BC Managed Care – PPO

## 2011-06-11 DIAGNOSIS — C8190 Hodgkin lymphoma, unspecified, unspecified site: Secondary | ICD-10-CM

## 2011-06-11 DIAGNOSIS — D649 Anemia, unspecified: Secondary | ICD-10-CM

## 2011-06-11 DIAGNOSIS — N1330 Unspecified hydronephrosis: Secondary | ICD-10-CM

## 2011-06-11 DIAGNOSIS — I517 Cardiomegaly: Secondary | ICD-10-CM

## 2011-06-11 HISTORY — PX: OTHER SURGICAL HISTORY: SHX169

## 2011-06-11 LAB — CBC
HCT: 31.4 % — ABNORMAL LOW (ref 39.0–52.0)
MCH: 22.7 pg — ABNORMAL LOW (ref 26.0–34.0)
MCV: 75.7 fL — ABNORMAL LOW (ref 78.0–100.0)
Platelets: 340 10*3/uL (ref 150–400)
RBC: 4.15 MIL/uL — ABNORMAL LOW (ref 4.22–5.81)
RDW: 18.6 % — ABNORMAL HIGH (ref 11.5–15.5)

## 2011-06-11 LAB — BASIC METABOLIC PANEL
CO2: 27 mEq/L (ref 19–32)
Calcium: 9.4 mg/dL (ref 8.4–10.5)
Creatinine, Ser: 1.51 mg/dL — ABNORMAL HIGH (ref 0.50–1.35)
Glucose, Bld: 157 mg/dL — ABNORMAL HIGH (ref 70–99)

## 2011-06-11 MED ORDER — SODIUM CHLORIDE 0.9 % IV SOLN
INTRAVENOUS | Status: DC
  Administered 2011-06-11 (×2): via INTRAVENOUS

## 2011-06-11 MED ORDER — SODIUM CHLORIDE 0.9 % IV SOLN
10.0000 [IU]/m2 | Freq: Once | INTRAVENOUS | Status: AC
  Administered 2011-06-11: 23 [IU] via INTRAVENOUS
  Filled 2011-06-11: qty 7.7

## 2011-06-11 MED ORDER — ALTEPLASE 2 MG IJ SOLR
2.0000 mg | Freq: Once | INTRAMUSCULAR | Status: DC | PRN
  Filled 2011-06-11: qty 2

## 2011-06-11 MED ORDER — HEPARIN SOD (PORK) LOCK FLUSH 100 UNIT/ML IV SOLN: 250.0000 [IU] | Freq: Once | INTRAVENOUS | Status: DC | PRN

## 2011-06-11 MED ORDER — HOT PACK MISC ONCOLOGY
1.0000 | Freq: Once | Status: DC | PRN
  Filled 2011-06-11: qty 1

## 2011-06-11 MED ORDER — SODIUM CHLORIDE 0.9 % IJ SOLN: 10.0000 mL | INTRAMUSCULAR | Status: DC | PRN

## 2011-06-11 MED ORDER — SODIUM CHLORIDE 0.9 % IV SOLN: Freq: Once | INTRAVENOUS | Status: DC

## 2011-06-11 MED ORDER — DOXORUBICIN HCL CHEMO IV INJECTION 2 MG/ML
25.0000 mg/m2 | Freq: Once | INTRAVENOUS | Status: AC
  Administered 2011-06-11: 56 mg via INTRAVENOUS
  Filled 2011-06-11: qty 28

## 2011-06-11 MED ORDER — DSS 100 MG PO CAPS: 100.0000 mg | ORAL_CAPSULE | Freq: Two times a day (BID) | ORAL | Status: AC

## 2011-06-11 MED ORDER — METOPROLOL SUCCINATE ER 25 MG PO TB24
25.0000 mg | ORAL_TABLET | Freq: Every day | ORAL | Status: DC
  Administered 2011-06-11: 25 mg via ORAL
  Filled 2011-06-11 (×2): qty 1

## 2011-06-11 MED ORDER — SODIUM CHLORIDE 0.9 % IJ SOLN: 3.0000 mL | INTRAMUSCULAR | Status: DC | PRN

## 2011-06-11 MED ORDER — FLUDEOXYGLUCOSE F - 18 (FDG) INJECTION
17.9000 | Freq: Once | INTRAVENOUS | Status: AC | PRN
  Administered 2011-06-11: 17.9 via INTRAVENOUS

## 2011-06-11 MED ORDER — SODIUM CHLORIDE 0.9 % IV SOLN
375.0000 mg/m2 | Freq: Once | INTRAVENOUS | Status: AC
  Administered 2011-06-11: 840 mg via INTRAVENOUS
  Filled 2011-06-11: qty 42

## 2011-06-11 MED ORDER — ONDANSETRON HCL 4 MG/2ML IJ SOLN
Freq: Once | INTRAMUSCULAR | Status: AC
  Administered 2011-06-11: 16 mg via INTRAVENOUS
  Filled 2011-06-11: qty 8

## 2011-06-11 MED ORDER — VINBLASTINE SULFATE CHEMO INJECTION 1 MG/ML
6.0000 mg/m2 | Freq: Once | INTRAVENOUS | Status: AC
  Administered 2011-06-11: 14 mg via INTRAVENOUS
  Filled 2011-06-11: qty 14

## 2011-06-11 MED ORDER — COLD PACK MISC ONCOLOGY
1.0000 | Freq: Once | Status: DC | PRN
  Filled 2011-06-11: qty 1

## 2011-06-11 MED ORDER — HEPARIN SOD (PORK) LOCK FLUSH 100 UNIT/ML IV SOLN
500.0000 [IU] | Freq: Once | INTRAVENOUS | Status: DC
  Filled 2011-06-11: qty 5

## 2011-06-11 NOTE — Progress Notes (Signed)
Patient ID: Devon Brady, male   DOB: [birth year], 61 y.o.   MRN: [id number] Request received for possible thyroid biopsy on pt. Recent thyroid US reviewed by Dr. Fredia Sorrow and he feels findings are c/w diffuse multinodular goiter with no discrete lesion to biopsy. We will await findings of upcoming PET scan and if thyroid is hypermetabolic we will reconsider biopsy.

## 2011-06-11 NOTE — Progress Notes (Signed)
Patient ID: Devon Brady, male   DOB: 1950/10/05, 61 y.o.   MRN: [id number] [facility] Progress Note:   1 Day Post-Op  Subjective: Mental status is clear Objective: Vital signs in last 24 hours: Temp:  [96.9 F (36.1 C)-98.4 F (36.9 C)] 98.1 F (36.7 C) (03/29 0529) Pulse Rate:  [72-81] 78  (03/29 0529) Resp:  [12-17] 16  (03/29 0529) BP: (116-140)/(60-86) 123/86 mmHg (03/29 0529) SpO2:  [98 %-100 %] 98 % (03/29 0529)  Intake/Output from previous day: 03/28 0701 - 03/29 0700 In: 1080 [P.O.:480; I.V.:600] Out: 4125 [Urine:4125] Intake/Output this shift: Total I/O In: 0  Out: 950 [Urine:950]  Physical Exam: Work of breathing is  normal  Lab Results:  Results for orders placed during the hospital encounter of 06/09/11 (from the past 48 hour(s))  URINALYSIS, ROUTINE W REFLEX MICROSCOPIC     Status: Abnormal   Collection Time   06/09/11  7:59 PM      Component Value Range Comment   Color, Urine YELLOW  YELLOW     APPearance CLEAR  CLEAR     Specific Gravity, Urine 1.016  1.005 - 1.030     pH 6.0  5.0 - 8.0     Glucose, UA NEGATIVE  NEGATIVE (mg/dL)    Hgb urine dipstick NEGATIVE  NEGATIVE     Bilirubin Urine NEGATIVE  NEGATIVE     Ketones, ur NEGATIVE  NEGATIVE (mg/dL)    Protein, ur NEGATIVE  NEGATIVE (mg/dL)    Urobilinogen, UA 0.2  0.0 - 1.0 (mg/dL)    Nitrite NEGATIVE  NEGATIVE     Leukocytes, UA TRACE (*) NEGATIVE    URINE MICROSCOPIC-ADD ON     Status: Abnormal   Collection Time   06/09/11  7:59 PM      Component Value Range Comment   Squamous Epithelial / LPF FEW (*) RARE     WBC, UA 7-10  <3 (WBC/hpf)    RBC / HPF 3-6  <3 (RBC/hpf)    Bacteria, UA MANY (*) RARE     Sperm, UA PRESENT      Urine-Other MUCOUS PRESENT     SEDIMENTATION RATE     Status: Abnormal   Collection Time   06/10/11  3:52 AM      Component Value Range Comment   Sed Rate 82 (*) 0 - 16 (mm/hr)   APTT     Status: Normal   Collection Time   06/10/11  3:52 AM   Component Value Range Comment   aPTT 32  24 - 37 (seconds)   BASIC METABOLIC PANEL     Status: Abnormal   Collection Time   06/10/11  3:52 AM      Component Value Range Comment   Sodium 138  135 - 145 (mEq/L)    Potassium 3.8  3.5 - 5.1 (mEq/L)    Chloride 104  96 - 112 (mEq/L)    CO2 25  19 - 32 (mEq/L)    Glucose, Bld 104 (*) 70 - 99 (mg/dL)    BUN 26 (*) 6 - 23 (mg/dL)    Creatinine, Ser 4.09 (*) 0.50 - 1.35 (mg/dL)    Calcium 9.3  8.4 - 10.5 (mg/dL)    GFR calc non Af Amer 45 (*) >90 (mL/min)    GFR calc Af Amer 52 (*) >90 (mL/min)   CBC     Status: Abnormal   Collection Time   06/10/11  3:52 AM      Component Value  Range Comment   WBC 12.2 (*) 4.0 - 10.5 (K/uL)    RBC 4.31  4.22 - 5.81 (MIL/uL)    Hemoglobin 9.8 (*) 13.0 - 17.0 (g/dL)    HCT 30.8 (*) 65.7 - 52.0 (%)    MCV 74.2 (*) 78.0 - 100.0 (fL)    MCH 22.7 (*) 26.0 - 34.0 (pg)    MCHC 30.6  30.0 - 36.0 (g/dL)    RDW 84.6 (*) 96.2 - 15.5 (%)    Platelets 369  150 - 400 (K/uL)   PROTIME-INR     Status: Normal   Collection Time   06/10/11  3:52 AM      Component Value Range Comment   Prothrombin Time 14.2  11.6 - 15.2 (seconds)    INR 1.08  0.00 - 1.49    BASIC METABOLIC PANEL     Status: Abnormal   Collection Time   06/11/11  5:40 AM      Component Value Range Comment   Sodium 134 (*) 135 - 145 (mEq/L)    Potassium 4.2  3.5 - 5.1 (mEq/L)    Chloride 101  96 - 112 (mEq/L)    CO2 27  19 - 32 (mEq/L)    Glucose, Bld 157 (*) 70 - 99 (mg/dL)    BUN 19  6 - 23 (mg/dL)    Creatinine, Ser 9.52 (*) 0.50 - 1.35 (mg/dL)    Calcium 9.4  8.4 - 10.5 (mg/dL)    GFR calc non Af Amer 49 (*) >90 (mL/min)    GFR calc Af Amer 57 (*) >90 (mL/min)   CBC     Status: Abnormal   Collection Time   06/11/11  5:40 AM      Component Value Range Comment   WBC 12.9 (*) 4.0 - 10.5 (K/uL)    RBC 4.15 (*) 4.22 - 5.81 (MIL/uL)    Hemoglobin 9.4 (*) 13.0 - 17.0 (g/dL)    HCT 84.1 (*) 32.4 - 52.0 (%)    MCV 75.7 (*) 78.0 - 100.0 (fL)    MCH  22.7 (*) 26.0 - 34.0 (pg)    MCHC 29.9 (*) 30.0 - 36.0 (g/dL)    RDW 40.1 (*) 02.7 - 15.5 (%)    Platelets 340  150 - 400 (K/uL)     Radiology/Results: US Soft Tissue Head/neck  06/10/2011  *RADIOLOGY REPORT*  Clinical Data: Recent diagnosis of Hodgkin's lymphoma.  Thyroid nodules.  THYROID ULTRASOUND  Technique: Ultrasound examination of the thyroid gland and adjacent soft tissues was performed.  Comparison:  CT chest 05/28/2011.  Findings:  Right thyroid lobe:  55 mm x 33 mm x 37 mm.  Large nodule.  See below. Left thyroid lobe:  62 mm x 37 mm x 42 mm.  Large nodule.  See below. Isthmus:  18 mm with nodule.  Multiple nodules are present however there is a rim of normal appearing thyroid tissue.  The rim of thyroid tissue has a relatively normal echotexture.  Focal nodules: 1. Left thyroid lobe 45 mm x 36 mm x 36 mm nodule.  This is hypervascular, with prominent vessels.  The nodule is predominately solid. 2. Right thyroid lobe 40 mm x 22 mm x 28 mm solid nodule, also hypervascular with prominent vessels. 3.  Nodule in the thyroid isthmus measures 33 mm x 21 mm x 46 mm. This is solid and hypervascular.  Lymphadenopathy:  None visualized.  IMPRESSION: Bilateral thyroid lobe and isthmic solid hypervascular thyroid nodules.  The nodules meet criteria  for thyroid biopsy.  Original Report Authenticated By: Andreas Newport, M.D.   Dg Chest Port 1 View  06/10/2011  *RADIOLOGY REPORT*  Clinical Data: Status post Port-A-Cath placement.  PORTABLE CHEST - 1 VIEW  Comparison: CT chest 05/28/2011 and PA and lateral chest 04/30/2011.  Findings: The patient has a new left subclavian approach Port-A- Cath.  Tip of the catheter is in the lower superior vena cava. There is no pneumothorax.  Heart size is normal.  Mediastinal lymphadenopathy noted.  IMPRESSION:  1.  Port-A-Cath in good position.  No pneumothorax. 2.  Mediastinal lymphadenopathy.  Original Report Authenticated By: Bernadene Bell. Maricela Curet, M.D.     Anti-infectives: Anti-infectives     Start     Dose/Rate Route Frequency Ordered Stop   06/10/11 1045   ceFAZolin (ANCEF) IVPB 1 g/50 mL premix     Comments: On call to OR for port a cath.      1 g 100 mL/hr over 30 Minutes Intravenous  Once 06/10/11 1030 06/10/11 1157          Assessment/Plan: Problem List: Patient Active Problem List  Diagnoses  . SINUSITIS- ACUTE-NOS  . CAD (coronary artery disease)  . Depression  . Erectile dysfunction  . Hydronephrosis  . Lymphadenopathy  . Multiple thyroid nodules  . Anemia  . Renal insufficiency  . Lymphocytosis  . Hodgkin's lymphoma    Left portacath placement yesterday.  No complaints or problems.  Will see as needed.  1 Day Post-Op    LOS: 2 days   Matt B. Daphine Deutscher, MD, [facility] [phone] beeper [phone]  06/11/2011 12:33 PM

## 2011-06-11 NOTE — Discharge Summary (Signed)
Physician Discharge Summary   Patient ID: Devon Brady [id number] 61 y.o. 1950-09-17  Admit date: 06/09/2011  Discharge date and time: No discharge date for patient encounter.   Admitting Physician: Exie Parody, MD   Discharge Physician: Exie Parody, MD   Admission Diagnoses:  Hodgkin's lymphoma  Discharge Diagnoses: Hodgkin's lymphoma  Admission Condition: fair  Discharged Condition: fair  Indication for Admission:  Need for urgent chemotherapy administration due to hydronephrosis and pain.   Hospital Course:   1. Newly diagnosed Hodgkin's lymphoma: at least stage III.  He had diagnostic bone marrow biopsy on 06/11/2011; result was pending on discharge.  He had PFT performed on 06/10/2011 with no major finding.  His echo was performed on 06/11/2011.  Result showed EF of 60%.  I again reviewed with patient and his wife the risk of benefit of systemic chemotherapy ABVD.  The chance of response is between 70-90% (lower range for patients with higher IPS score with him being high risk). I discussed with patient and his wife again potential side effects of ABVD which include but not limited to fatigue, alopecia, mucositis, nausea/vomiting, cardiomyopathy, pneumonitis, 2nd malignancy such as MDS/leukemia, neuropathy, cytopenia, infection, bleeding, skin rash.  They expressed informed understanding and wished to proceed with chemo.    He received cycle #1, day#1 of chemo ABVD on 06/11/2011 without major complication.    2. Right flank pain:  Ddx: Lymphoma. Recent CT staging did not show other etiology.  Rx: His UA did not show blood but did have some bacteria.  He was asymptomatic.  He was afebrile the entire hospital course.  His urine culture was sent and was pending on discharge.   Hopefully his symptoms of right flank pain will improve with chemo. He had good relief with Vicodin. He was placed on bowel regimen.   3. Thyromegaly with thyroid nodule:  Ddx: thyroid involvement of  Hodgkin's dz (rare presentation) vs thyroid cancer.  Work up: I obtained US thyroid on 06/10/2011.  Result showed bilateral thyroid lobe and isthmic solid hypervascular thyroid  nodules. The nodules meet criteria for thyroid biopsy; but PET scan did not show activities.  I will discuss his case at ENT conference next week to see if thyroid biopsy is indicated now or watchful waiting.   4. Leukocytosis:  - Ddx: Concerning for lymphoproliferative process as #1. Pending diagnostic staging bone marrow biopsy. There was no sign of focal infection during the hospital course, and he was afebrile.   5. Hydronophrosis: Cr improved with IVF during the hospital course. Hopefully will get better after chemo.   6. Anemia: Most likely due to chronic inflammation vs chronic renal insufficiency. His his Hgb in the past was normal in 2011, it is unlikely that he has hemoglobinopathy. Anemia work up 2 wks ago was negative. This is most likely due to lymphoma. He did not have active bleeding or symptomatic anemia. Thus, he did not required pRBC transfusion during the admission.  7. HTN: he was resumed on Toprol per PCP.   8. HLP: he was resumed on Crestor per PCP.   FULL CODE.     Consults: General Surgery for placement of Portacath for chemo access.   Significant Diagnostic Studies:  - US neck 06/10/2011 (result as noted in Hospital Course section) - PFT 06/10/2011:  Within gross normal limit.  - Diagnostic bone marrow biopsy 06/11/2011:  Result pending. - 2D echocardiogram:   Left ventricle: The cavity size was normal. Wall thickness was increased in a  pattern of mild LVH. Systolic function was normal. The estimated ejection fraction was in the range of 60% to 65%. Features are consistent with a pseudonormal left ventricular filling pattern, with concomitant abnormal relaxation and increased filling pressure (grade 2 diastolic dysfunction).      Treatments: chemo therapy ABVD on 06/11/2011.    Discharge Exam: General: well-nourished, no respiratory distress. He was in moderate lower back pain. Eyes: no scleral icterus. ENT: There were no oropharyngeal lesions. Neck exam showed bilateral thyromegaly. Lymphatics: positive for left supraclavicular node about 1cm; right cervical level II about 2cm; bilateral inguinal adenopathy of about 2-3cm. Respiratory: lungs were clear bilaterally without wheezing or crackles. Cardiovascular: Regular rate and rhythm, S1/S2, without murmur, rub or gallop. There was no pedal edema. GI: abdomen was soft, flat, nontender, nondistended, without organomegaly. Muscoloskeletal: no spinal tenderness of palpation of vertebral spine. I could not palpate any right flank mass, nor was there pain on percussion today. Skin exam was without echymosis, petichae.  Disposition: 01-Home or Self Care  Patient Instructions:  Medication List  As of 06/11/2011  9:04 AM   STOP taking these medications         acetaminophen 500 MG tablet      CoQ-10 200 MG Caps      oxyCODONE-acetaminophen 5-325 MG per tablet      S.S.S. TONIC PO      terbinafine 250 MG tablet         TAKE these medications         aspirin 81 MG tablet   Take 81 mg by mouth.      cetirizine 10 MG tablet   Commonly known as: ZYRTEC   Take 10 mg by mouth daily.      DSS 100 MG Caps   Take 100 mg by mouth 2 (two) times daily.      Fish Oil 1000 MG Caps   Take 1 capsule by mouth 3 (three) times daily with meals.      fluticasone 50 MCG/ACT nasal spray   Commonly known as: FLONASE   Place 2 sprays into the nose as needed. Allergies        HYDROcodone-acetaminophen 5-325 MG per tablet   Commonly known as: NORCO   Take 1 tablet by mouth every 6 (six) hours as needed for pain.      LORazepam 0.5 MG tablet   Commonly known as: ATIVAN   Take 1 tablet (0.5 mg total) by mouth every 6 (six) hours as needed (Nausea or vomiting).      metoprolol succinate 50 MG 24 hr tablet   Commonly known  as: TOPROL-XL      multivitamins with iron Tabs   Take 1 tablet by mouth daily.      omeprazole 40 MG capsule   Commonly known as: PRILOSEC   Take 40 mg by mouth every morning.      ondansetron 8 MG tablet   Commonly known as: ZOFRAN   Take 1 tab two times a day starting the day after chemo for 3 days. Then take 1 tab two times a day as needed for nausea or vomiting.      prochlorperazine 10 MG tablet   Commonly known as: COMPAZINE   Take 1 tablet (10 mg total) by mouth every 6 (six) hours as needed (Nausea or vomiting).      rosuvastatin 20 MG tablet   Commonly known as: CRESTOR   Take 20 mg by mouth every evening.  Activity: activity as tolerated Diet: regular diet Wound Care: not applicable.   Follow-up with Dr. Gaylyn Rong for cycle #1, day #15 of chemo on 06/25/2011.   SignedJethro Bolus 06/11/2011 9:04 AM

## 2011-06-11 NOTE — Procedures (Signed)
Bone marrow biopsy and aspiration.  INDICATION:  Staging for Hodgkin's lymphoma.   Procedure: After obtained from consent, Devon Brady was placed in the prone position. Time out was performed verifying correct patient and procedure. The skin overlying the left posterior crest was prepped with Betadine and draped in the usual sterile fashion. The skin and periosteum were infiltrated with 10 mL of 2% lidocaine x 3 as he was still uncomfortable after the first 20 mL. A small puncture wound was made with #11 scalpel blade.  Bone marrow aspirate was obtained on the first pass of the aspiration needle.  One core biopsy was obtained through the same incision.   The aspirate was sent for routine histology, flow cytometry, and cytogenetics.  Core biopsy was sent for routine histology.   Devon Brady tolerated procedure well with minimal  blood loss and without immediate complication.   A sterile dressing was applied.   Jethro Bolus M.D. 06/11/2011

## 2011-06-11 NOTE — Progress Notes (Signed)
*  PRELIMINARY RESULTS* Echocardiogram 2D Echocardiogram has been performed.  Devon Brady 06/11/2011, 9:27 AM

## 2011-06-12 LAB — URINE CULTURE
Colony Count: NO GROWTH
Culture  Setup Time: 201303291313
Culture: NO GROWTH

## 2011-06-14 ENCOUNTER — Other Ambulatory Visit: Payer: Self-pay | Admitting: Certified Registered Nurse Anesthetist

## 2011-06-14 ENCOUNTER — Telehealth: Payer: Self-pay | Admitting: Oncology

## 2011-06-14 NOTE — Telephone Encounter (Signed)
Last seen 11/02/10. Please advise     KP

## 2011-06-14 NOTE — Progress Notes (Signed)
Patient is getting chemotherapy today, AVBD,teachings given to patient and family, verbalized understanding.

## 2011-06-14 NOTE — Telephone Encounter (Signed)
aware of 4/4 appts and will p/u a new sch at that time  aom

## 2011-06-16 ENCOUNTER — Ambulatory Visit (INDEPENDENT_AMBULATORY_CARE_PROVIDER_SITE_OTHER): Payer: BC Managed Care – PPO | Admitting: General Surgery

## 2011-06-16 NOTE — Progress Notes (Signed)
Patient states that he is feeling better today, and has no reports of nausea and vomiting today, since he began taking anti-nausea meds yesterday.

## 2011-06-16 NOTE — H&P (Signed)
Chief Complaint: Right flank pain.  HPI: Mr. Cayden Granholm is a 61 year-old [place] American man with newly diagnosed Hodgkin's lymphoma.  He has history of HTN, HLP, renal insufficiency with baseline Cr of 1.4; benign thyroid nodules. He was in [facility] until about a few months ago when he developed nonproductive cough. This cough has no exacerbating or alleviating factors. He denies SOB, DOE, hemoptysis. He also noticed right > left inguinal swelling. He also was noted to have neck swelling. US neck on 05/21/11 showed bilateral thyroid nodules; the largest one was 4.9cm in the left lobe. He was referred for renal US due to renal insuffiencey. On 05/26/2011, renal US showed bilateral hydronephrosis R/L with possible pelvic and retroperitoneal adenopathy. He was evaluated by PCP. On 04/30/2011, WBC 14.2; Hgb 11.3; Plt 479. Given his leukocytosis, he was kindly referred to me on 05/27/2011 for evaluation. At that presentation, he was noted to have cervical, axilary, and inguinal LAD. He was referred to Dr. Andrey Campanile who performed excisional biopsy of a right inguinal adenopathy with path case # [id number] consistent with classical Hodgkin's lymphoma, nodular sclerosis type (CD15+, CD30+; rare CD20+; LCA negative; CD3 negative). He see seen urgently at the [facility] today for evaluation to go over treatment option.  [facility] with his wife and daughter. He reported that he has been having worsening right flank pain. Pain is very crampy, worsened with sitting, better with standing up, only slightly improved with Tylenol from 8/10 down to maybe 5/10, no radiation to the groin, no dysuria, hematuria, pyuria. His cough has improved since he last saw me. He still has fatigue; however, he is independent of all activities of daily living and chores. He still notices cervical adenopathy but no other adenopathy. His excisional wound from his right groin has healed nicely.  Patient denies headache,  visual changes, confusion, drenching night sweats, mucositis, odynophagia, dysphagia, nausea vomiting, jaundice, chest pain, palpitation, shortness of breath, dyspnea on exertion, productive cough, gum bleeding, epistaxis, hematemesis, hemoptysis, abdominal pain, abdominal swelling, early satiety, melena, hematochezia, hematuria, skin rash, spontaneous bleeding, joint swelling, joint pain, heat or cold intolerance, bowel bladder incontinence, focal motor weakness, paresthesia, depression, suicidal or homocidal ideation, feeling hopelessness.  Past Medical History   Diagnosis  Date   .  Hypertension    .  Hyperlipidemia    .  CAD (coronary artery disease)      s/p 5V CABG 8/11   .  RCA occlusion    .  Multiple thyroid nodules    .  Anemia    .  Lymphocytosis    .  Lymphadenopathy    .  Hydronephrosis    .  Renal insufficiency    .  Complication of anesthesia      slow to wake up   .  Dizziness      occasional for last week   .  Hemorrhoid    .  Sleep apnea      STOPBANG=5   .  Hodgkin's lymphoma     Past Surgical History   Procedure  Date   .  Vasectomy  1980   .  Bunionectomy  1978     right foot   .  Cardiac catheterization  10/15/2009   .  Coronary artery bypass graft  10/17/2009     LIMA to LAD,SVG to Ramus,SVG to OM sequential to OM2, SVG to marginal of RCA   .  Tonsillectomy  as child  Family History   Problem  Relation  Age of Onset   .  Alcohol abuse     .  Arthritis     .  Diabetes  Father    .  Coronary artery disease  Father  70      dscd    .  Kidney disease     .  Prostate cancer     .  Hypertension  Mother    .  Hyperlipidemia     Social History: reports that he has never smoked. He has never used smokeless tobacco. He reports that he drinks about .6 ounces of alcohol per week. He reports that he does not use illicit drugs.  Allergies: No Known Allergies  Medications Prior to Admission   Medication  Sig  Dispense  Refill   .  acetaminophen (TYLENOL) 500 MG  tablet  Take 1,000 mg by mouth every 6 (six) hours as needed. Pain     .  aspirin 81 MG tablet  Take 81 mg by mouth.     .  cetirizine (ZYRTEC) 10 MG tablet  Take 10 mg by mouth daily.     .  Coenzyme Q10 (COQ-10) 200 MG CAPS  Take 1 capsule by mouth daily.     .  fluticasone (FLONASE) 50 MCG/ACT nasal spray  Place 2 sprays into the nose as needed. Allergies     .  Iron-Vitamins (S.S.S. TONIC PO)  Take 30 mLs by mouth daily.     .  metoprolol succinate (TOPROL-XL) 50 MG 24 hr tablet      .  Multiple Vitamins-Iron (MULTIVITAMINS WITH IRON) TABS  Take 1 tablet by mouth daily.     .  Omega-3 Fatty Acids (FISH OIL) 1000 MG CAPS  Take 1 capsule by mouth 3 (three) times daily with meals.     Marland Kitchen  omeprazole (PRILOSEC) 40 MG capsule  Take 40 mg by mouth every morning.     .  rosuvastatin (CRESTOR) 20 MG tablet  Take 20 mg by mouth every evening.     .  terbinafine (LAMISIL) 250 MG tablet  Take 250 mg by mouth daily.     Marland Kitchen  oxyCODONE-acetaminophen (ROXICET) 5-325 MG per tablet  Take 1 tablet by mouth every 4 (four) hours as needed for pain.  30 tablet  0   .  DISCONTD: citalopram (CELEXA) 20 MG tablet  Take 1 tablet (20 mg total) by mouth daily.  30 tablet  1   .  DISCONTD: omeprazole (PRILOSEC OTC) 20 MG tablet  Take 1 tablet (20 mg total) by mouth daily.  28 tablet  1    No current facility-administered medications on file as of 06/09/2011.    Results for orders placed in visit on 06/09/11 (from the past 48 hour(s))   CBC WITH DIFFERENTIAL Status: Abnormal    Collection Time    06/09/11 2:37 PM   Component  Value  Range  Comment    WBC  13.3 (*)  4.0 - 10.3 (10e3/uL)     NEUT#  10.1 (*)  1.5 - 6.5 (10e3/uL)     HGB  10.4 (*)  13.0 - 17.1 (g/dL)     HCT  16.1 (*)  09.6 - 49.9 (%)     Platelets  451 (*)  140 - 400 (10e3/uL)     MCV  74.9 (*)  79.3 - 98.0 (fL)     MCH  23.5 (*)  27.2 - 33.4 (pg)  MCHC  31.3 (*)  32.0 - 36.0 (g/dL)     RBC  1.61  0.96 - 5.82 (10e6/uL)     RDW  19.8 (*)  11.0 -  14.6 (%)     lymph#  2.0  0.9 - 3.3 (10e3/uL)     MONO#  1.1 (*)  0.1 - 0.9 (10e3/uL)     Eosinophils Absolute  0.2  0.0 - 0.5 (10e3/uL)     Basophils Absolute  0.0  0.0 - 0.1 (10e3/uL)     NEUT%  75.6 (*)  39.0 - 75.0 (%)     LYMPH%  14.8  14.0 - 49.0 (%)     MONO%  8.1  0.0 - 14.0 (%)     EOS%  1.3  0.0 - 7.0 (%)     BASO%  0.2  0.0 - 2.0 (%)    Pertinent items are noted in HPI.  Blood pressure 119/72, pulse 76, temperature 97.2 F (36.2 C), temperature source Oral, height 5\' 10"  (1.778 m), weight 227 lb 12.8 oz (103.329 kg).  PHYSICAL EXAM:  General: well-nourished, no respiratory distress. He was in moderate lower back pain. Eyes: no scleral icterus. ENT: There were no oropharyngeal lesions. Neck exam showed bilateral thyromegaly. Lymphatics: positive for left supraclavicular node about 1cm; right cervical level II about 2cm; bilateral inguinal adenopathy of about 2-3cm. Respiratory: lungs were clear bilaterally without wheezing or crackles. Cardiovascular: Regular rate and rhythm, S1/S2, without murmur, rub or gallop. There was no pedal edema. GI: abdomen was soft, flat, nontender, nondistended, without organomegaly. Muscoloskeletal: no spinal tenderness of palpation of vertebral spine. I could not palpate any right flank mass. Skin exam was without echymosis, petichae. Neuro exam was nonfocal. Patient was able to get on and off exam table without assistance. Gait was normal. Patient was alerted and oriented. Attention was good. Language was appropriate. Mood was normal without depression. Speech was not pressured. Thought content was not tangential.  Assessment/Plan  1. Newly diagnosed Hodgkin's lymphoma:  Staging: At least stage III given both above and below diaphragm involvement. I recommended staging with PET and bone marrow biopsy. [facility] PET scan is down until next week. I cannot wait longer given his hydronephrosis and moderate/severe back pain. I would like to go ahead with chemo and  then obtain PET scan ASAP next week.  Prognosis: Pending complete staging PET scan. However, so far, he has IPS of 5 (alb <4; Hgb <10.5; Male, age >8; WBC >15K). His 5 year overall survival is about 60-70%.  Treatment: We discussed the standard of care in the [place] being ABVD (adriamycin, bleomycin, vinblastine, dacarbazine). This chemotherapy is better tolerated than BEACOPP. BEACOPP may have better response rate than ABVD; however, it has much more chance of infection and other complications. We recommend ABVD and repeat PET scan after 2 full cycles. If there is less than a robust response, then we may consider switching to BEACOPP at that time. I discussed with patient and his family potential side effects of ABVD which include but not limited to fatigue, alopecia, mucositis, nausea/vomiting, cardiomyopathy, pneumonitis, 2nd malignancy such as MDS/leukemia, neuropathy, cytopenia, infection, bleeding, skin rash.  Mr. Sees expressed informed understanding and wished to proceed with chemo ASAP given his hydronephrosis and right lower back pain from presumed hydronephrosis. I discussed with patient and family that there is no role for upfront autologous BMT unless he has relapse.  In preparation for chemo, I admit patient today for expedited work up and start of chemo. I  consulted General Surgery (pt had seen Dr. Gaynelle Adu in the past) for portacath placement for chemo access. I requested 2D echo without contrast to assess baseline cardiac function. I requested PFT baseline. Chemo hopefully will be able to start late tomorrow or at the latest Friday 06/11/11.  2. Right flank pain:  Ddx: Lymphoma. Recent CT staging did not show other etiology. I will send for UA to rule out UTI. There was low clinical concern for cord compression per CT work up.  Rx: Hopefully his symptoms will improve with chemo. I will prescribe Lortab prn moderate pain and morphine for severe pain. I placed him on bowel regimen.  3.  Thyromegaly with thyroid nodule:  Ddx: thyroid involvement of Hodgkin's dz (rare presentation) vs thyroid cancer.  Work up: I will obtain US-guided thyroid biopsy to ensure that I'm not missing another diagnosis.  4. Leukocytosis:  - Ddx: Concerning for lymphoproliferative process as #1. Pending diagnostic staging bone marrow biopsy. There is no sign of focal infection.  5. Hydronophrosis: Due to obstructive adenopathy. Work up with CT noncontrast. Treating underlying dz. I will continue to monitor his Cr and consider stents in the future if his Cr worsens. As lymphoma respond rather briskly to chemo, and placement of nephrostomy will expose him to risk of infection, I prefer to wait on nephrostomy unless his Cr worsens.  6. Anemia: Most likely due to chronic inflammation vs chronic renal insufficiency. His his Hgb in the past was normal in 2011, it is unlikely that he has hemoglobinopathy. Anemia work up 2 wks ago was negative. This is most likely due to lymphoma. Will follow CBC.  7. HTN: will resume Toprol per PCP.  8. HLP: will resume Crestor per PCP.  FULL CODE.  The length of time of the face-to-face encounter was 60 minutes. More than 50% of time was spent counseling and coordination of care.  Maricela Kawahara  06/09/2011, 5:45 PM

## 2011-06-17 ENCOUNTER — Encounter: Payer: Self-pay | Admitting: *Deleted

## 2011-06-17 ENCOUNTER — Other Ambulatory Visit (HOSPITAL_BASED_OUTPATIENT_CLINIC_OR_DEPARTMENT_OTHER): Payer: BC Managed Care – PPO | Admitting: Lab

## 2011-06-17 ENCOUNTER — Other Ambulatory Visit: Payer: BC Managed Care – PPO

## 2011-06-17 ENCOUNTER — Other Ambulatory Visit: Payer: Self-pay

## 2011-06-17 DIAGNOSIS — C8190 Hodgkin lymphoma, unspecified, unspecified site: Secondary | ICD-10-CM

## 2011-06-17 DIAGNOSIS — N1330 Unspecified hydronephrosis: Secondary | ICD-10-CM

## 2011-06-17 LAB — CBC WITH DIFFERENTIAL/PLATELET
Basophils Absolute: 0 10*3/uL (ref 0.0–0.1)
EOS%: 2.6 % (ref 0.0–7.0)
HCT: 32.6 % — ABNORMAL LOW (ref 38.4–49.9)
HGB: 10.2 g/dL — ABNORMAL LOW (ref 13.0–17.1)
MCH: 23.3 pg — ABNORMAL LOW (ref 27.2–33.4)
MCV: 74.5 fL — ABNORMAL LOW (ref 79.3–98.0)
MONO%: 2.3 % (ref 0.0–14.0)
NEUT%: 58.3 % (ref 39.0–75.0)

## 2011-06-17 LAB — LACTATE DEHYDROGENASE: LDH: 147 U/L (ref 94–250)

## 2011-06-17 LAB — COMPREHENSIVE METABOLIC PANEL
Albumin: 3.6 g/dL (ref 3.5–5.2)
Alkaline Phosphatase: 61 U/L (ref 39–117)
BUN: 28 mg/dL — ABNORMAL HIGH (ref 6–23)
Glucose, Bld: 102 mg/dL — ABNORMAL HIGH (ref 70–99)
Potassium: 4.4 mEq/L (ref 3.5–5.3)
Total Bilirubin: 0.4 mg/dL (ref 0.3–1.2)

## 2011-06-17 LAB — URIC ACID: Uric Acid, Serum: 7 mg/dL (ref 4.0–7.8)

## 2011-06-17 MED ORDER — LIDOCAINE-PRILOCAINE 2.5-2.5 % EX CREA: TOPICAL_CREAM | CUTANEOUS | Status: DC

## 2011-06-21 ENCOUNTER — Other Ambulatory Visit: Payer: Self-pay | Admitting: Certified Registered Nurse Anesthetist

## 2011-06-23 ENCOUNTER — Ambulatory Visit (INDEPENDENT_AMBULATORY_CARE_PROVIDER_SITE_OTHER): Payer: BC Managed Care – PPO | Admitting: General Surgery

## 2011-06-23 ENCOUNTER — Encounter: Payer: Self-pay | Admitting: Oncology

## 2011-06-23 ENCOUNTER — Telehealth: Payer: Self-pay | Admitting: *Deleted

## 2011-06-23 NOTE — Telephone Encounter (Signed)
Daughter called for results of BMBx.   Dr. Gaylyn Rong instructs that he will give pt/family results on office visit in 2 days.  Informed Daughter and she verbalized understanding.

## 2011-06-23 NOTE — Progress Notes (Signed)
Faxed fmla papers to [facility] attn: Carney Living @ [phone].

## 2011-06-25 ENCOUNTER — Telehealth: Payer: Self-pay | Admitting: Oncology

## 2011-06-25 ENCOUNTER — Ambulatory Visit (HOSPITAL_BASED_OUTPATIENT_CLINIC_OR_DEPARTMENT_OTHER): Payer: BC Managed Care – PPO | Admitting: Lab

## 2011-06-25 ENCOUNTER — Ambulatory Visit (HOSPITAL_BASED_OUTPATIENT_CLINIC_OR_DEPARTMENT_OTHER): Payer: BC Managed Care – PPO | Admitting: Oncology

## 2011-06-25 ENCOUNTER — Ambulatory Visit (HOSPITAL_BASED_OUTPATIENT_CLINIC_OR_DEPARTMENT_OTHER): Payer: BC Managed Care – PPO

## 2011-06-25 ENCOUNTER — Encounter: Payer: Self-pay | Admitting: Oncology

## 2011-06-25 ENCOUNTER — Institutional Professional Consult (permissible substitution): Payer: BC Managed Care – PPO | Admitting: Pulmonary Disease

## 2011-06-25 VITALS — BP 121/76 | HR 71 | Temp 97.0°F | Ht 70.0 in | Wt 228.3 lb

## 2011-06-25 DIAGNOSIS — R591 Generalized enlarged lymph nodes: Secondary | ICD-10-CM

## 2011-06-25 DIAGNOSIS — D709 Neutropenia, unspecified: Secondary | ICD-10-CM | POA: Insufficient documentation

## 2011-06-25 DIAGNOSIS — D649 Anemia, unspecified: Secondary | ICD-10-CM

## 2011-06-25 DIAGNOSIS — C8190 Hodgkin lymphoma, unspecified, unspecified site: Secondary | ICD-10-CM

## 2011-06-25 DIAGNOSIS — Z5111 Encounter for antineoplastic chemotherapy: Secondary | ICD-10-CM

## 2011-06-25 DIAGNOSIS — N1330 Unspecified hydronephrosis: Secondary | ICD-10-CM

## 2011-06-25 DIAGNOSIS — E042 Nontoxic multinodular goiter: Secondary | ICD-10-CM

## 2011-06-25 DIAGNOSIS — N289 Disorder of kidney and ureter, unspecified: Secondary | ICD-10-CM

## 2011-06-25 LAB — COMPREHENSIVE METABOLIC PANEL
ALT: 10 U/L (ref 0–53)
AST: 18 U/L (ref 0–37)
Albumin: 3.6 g/dL (ref 3.5–5.2)
CO2: 24 mEq/L (ref 19–32)
Calcium: 8.9 mg/dL (ref 8.4–10.5)
Chloride: 108 mEq/L (ref 96–112)
Potassium: 4.4 mEq/L (ref 3.5–5.3)
Sodium: 140 mEq/L (ref 135–145)
Total Protein: 6.8 g/dL (ref 6.0–8.3)

## 2011-06-25 LAB — CBC WITH DIFFERENTIAL/PLATELET
BASO%: 1.3 % (ref 0.0–2.0)
HCT: 34.5 % — ABNORMAL LOW (ref 38.4–49.9)
MCHC: 31.3 g/dL — ABNORMAL LOW (ref 32.0–36.0)
MONO#: 0.3 10*3/uL (ref 0.1–0.9)
NEUT%: 21.1 % — ABNORMAL LOW (ref 39.0–75.0)
RBC: 4.55 10*6/uL (ref 4.20–5.82)
RDW: 20.7 % — ABNORMAL HIGH (ref 11.0–14.6)
WBC: 3.8 10*3/uL — ABNORMAL LOW (ref 4.0–10.3)
lymph#: 2.3 10*3/uL (ref 0.9–3.3)

## 2011-06-25 MED ORDER — HEPARIN SOD (PORK) LOCK FLUSH 100 UNIT/ML IV SOLN
500.0000 [IU] | Freq: Once | INTRAVENOUS | Status: AC | PRN
  Administered 2011-06-25: 500 [IU]
  Filled 2011-06-25: qty 5

## 2011-06-25 MED ORDER — ONDANSETRON 16 MG/50ML IVPB (CHCC)
16.0000 mg | Freq: Once | INTRAVENOUS | Status: AC
  Administered 2011-06-25: 16 mg via INTRAVENOUS

## 2011-06-25 MED ORDER — SODIUM CHLORIDE 0.9 % IJ SOLN
10.0000 mL | INTRAMUSCULAR | Status: DC | PRN
  Administered 2011-06-25: 10 mL
  Filled 2011-06-25: qty 10

## 2011-06-25 MED ORDER — SODIUM CHLORIDE 0.9 % IV SOLN
375.0000 mg/m2 | Freq: Once | INTRAVENOUS | Status: AC
  Administered 2011-06-25: 840 mg via INTRAVENOUS
  Filled 2011-06-25: qty 42

## 2011-06-25 MED ORDER — SODIUM CHLORIDE 0.9 % IV SOLN
Freq: Once | INTRAVENOUS | Status: AC
  Administered 2011-06-25: 11:00:00 via INTRAVENOUS

## 2011-06-25 MED ORDER — SODIUM CHLORIDE 0.9 % IV SOLN
10.0000 [IU]/m2 | Freq: Once | INTRAVENOUS | Status: AC
  Administered 2011-06-25: 23 [IU] via INTRAVENOUS
  Filled 2011-06-25: qty 7.7

## 2011-06-25 MED ORDER — DOXORUBICIN HCL CHEMO IV INJECTION 2 MG/ML
25.0000 mg/m2 | Freq: Once | INTRAVENOUS | Status: AC
  Administered 2011-06-25: 56 mg via INTRAVENOUS
  Filled 2011-06-25: qty 28

## 2011-06-25 MED ORDER — VINBLASTINE SULFATE CHEMO INJECTION 1 MG/ML
6.0000 mg/m2 | Freq: Once | INTRAVENOUS | Status: AC
  Administered 2011-06-25: 14 mg via INTRAVENOUS
  Filled 2011-06-25: qty 14

## 2011-06-25 MED ORDER — DEXAMETHASONE SODIUM PHOSPHATE 4 MG/ML IJ SOLN
20.0000 mg | Freq: Once | INTRAMUSCULAR | Status: AC
  Administered 2011-06-25: 20 mg via INTRAVENOUS

## 2011-06-25 NOTE — Progress Notes (Signed)
Okay to treat despite labs, per Dr. Gaylyn Rong.

## 2011-06-25 NOTE — Telephone Encounter (Signed)
Gv pt appt for april-may2013 

## 2011-06-25 NOTE — Progress Notes (Signed)
[facility] OFFICE PROGRESS NOTE  Cc:  Henri Medal, MD, MD  DIAGNOSIS: Hodgkin's Lymphoma, stage III.  CURRENT THERAPY: ABVD started on 06/11/11.  INTERVAL HISTORY: Devon Brady 61 y.o. male returns for routine followup today prior to his chemotherapy. His wife accompanies him to the office. He is here for cycle 1 day 15 of his chemotherapy. Cycle 1 day 1 was administered in the hospital urgently due to hydronephrosis. Patient states he's been doing well since hospital discharge. He states that his energy level has improved. He denies any fevers, chest pain, shortness of breath, dyspnea on exertion, abdominal pain. He had mild nausea and vomiting right after his first dose of chemotherapy, but this has now resolved. He had constipation, but this has now resolved as well. He has not had the pain since hospital discharge and in fact has not taken any pain medications. His right neck lymphadenopathy is significantly improved. He also thinks that his thyroid nodule is somewhat smaller. He has not had any mucositis. He is voiding well without any dysuria or hematuria. The remainder of the 14 point review of systems is negative.  Past Medical History  Diagnosis Date  . Hypertension   . Hyperlipidemia   . CAD (coronary artery disease)     s/p 5V CABG 8/11  . RCA occlusion   . Multiple thyroid nodules   . Anemia   . Lymphocytosis   . Lymphadenopathy   . Hydronephrosis   . Renal insufficiency   . Complication of anesthesia     slow to wake up  . Dizziness     occasional for last week  . Hemorrhoid   . Sleep apnea     STOPBANG=5  . Hodgkin's lymphoma     Past Surgical History  Procedure Date  . Vasectomy 1980  . Bunionectomy 1978    right foot  . Cardiac catheterization 10/15/2009  . Coronary artery bypass graft 10/17/2009    LIMA to LAD,SVG to Ramus,SVG to OM sequential to OM2, SVG to marginal of RCA  . Tonsillectomy as child  . Portacath placement 06/10/2011      Procedure: INSERTION PORT-A-CATH;  Surgeon: Almond Lint, MD;  Location: [facility];  Service: General;  Laterality: Left;  subclavian     Current Outpatient Prescriptions  Medication Sig Dispense Refill  . aspirin 81 MG tablet Take 81 mg by mouth.       . cetirizine (ZYRTEC) 10 MG tablet Take 10 mg by mouth daily.      . fluticasone (FLONASE) 50 MCG/ACT nasal spray Place 2 sprays into the nose as needed. Allergies       . HYDROcodone-acetaminophen (NORCO) 5-325 MG per tablet 1 tablet Every 6 hours as needed.      . lidocaine-prilocaine (EMLA) cream APPLY TO PAC 1- 2 HOURS PRIOR TO ACCESS.  30 g  1  . LORazepam (ATIVAN) 0.5 MG tablet Take 1 tablet (0.5 mg total) by mouth every 6 (six) hours as needed (Nausea or vomiting).  30 tablet  0  . metoprolol succinate (TOPROL-XL) 50 MG 24 hr tablet       . metoprolol succinate (TOPROL-XL) 50 MG 24 hr tablet TAKE 1 TABLET EVERY DAY  30 tablet  2  . Multiple Vitamins-Iron (MULTIVITAMINS WITH IRON) TABS Take 1 tablet by mouth daily.      . Omega-3 Fatty Acids (FISH OIL) 1000 MG CAPS Take 1 capsule by mouth 3 (three) times daily with meals.       Marland Kitchen  omeprazole (PRILOSEC) 40 MG capsule Take 40 mg by mouth every morning.       . ondansetron (ZOFRAN) 8 MG tablet Take 1 tab two times a day starting the day after chemo for 3 days. Then take 1 tab two times a day as needed for nausea or vomiting.  30 tablet  1  . prochlorperazine (COMPAZINE) 10 MG tablet Take 1 tablet (10 mg total) by mouth every 6 (six) hours as needed (Nausea or vomiting).  30 tablet  1  . rosuvastatin (CRESTOR) 20 MG tablet Take 20 mg by mouth every evening.      Marland Kitchen DISCONTD: citalopram (CELEXA) 20 MG tablet Take 1 tablet (20 mg total) by mouth daily.  30 tablet  1  . DISCONTD: omeprazole (PRILOSEC OTC) 20 MG tablet Take 1 tablet (20 mg total) by mouth daily.  28 tablet  1    ALLERGIES:   has no known allergies.  REVIEW OF SYSTEMS:  The rest of the 14-point review of system was negative.    Filed Vitals:   06/25/11 1000  BP: 121/76  Pulse: 71  Temp: 97 F (36.1 C)   Wt Readings from Last 3 Encounters:  06/25/11 228 lb 4.8 oz (103.556 kg)  06/09/11 227 lb (102.967 kg)  06/09/11 227 lb (102.967 kg)   ECOG Performance status: 0  PHYSICAL EXAMINATION: General:  well-nourished in no acute distress.  Eyes:  no scleral icterus.  ENT:  There were no oropharyngeal lesions.  Neck exam showed bilateral thyromegaly. Previously palpated right cervical lymph node no longer palpable. Respiratory: lungs were clear bilaterally without wheezing or crackles.  Cardiovascular:  Regular rate and rhythm, S1/S2, without murmur, rub or gallop.  There was no pedal edema.  GI:  abdomen was soft, flat, nontender, nondistended, without organomegaly.  Muscoloskeletal:  no spinal tenderness of palpation of vertebral spine.  Skin exam was without echymosis, petichae.  Neuro exam was nonfocal.  Patient was able to get on and off exam table without assistance.  Gait was normal.  Patient was alerted and oriented.  Attention was good.   Language was appropriate.  Mood was normal without depression.  Speech was not pressured.  Thought content was not tangential.    LABORATORY/RADIOLOGY DATA:  Lab Results  Component Value Date   WBC 3.8* 06/25/2011   HGB 10.8* 06/25/2011   HCT 34.5* 06/25/2011   PLT 204 06/25/2011   GLUCOSE 102* 06/17/2011   CHOL 122 10/13/2010   TRIG 43.0 10/13/2010   HDL 29.10* 10/13/2010   LDLCALC 84 10/13/2010   ALKPHOS 61 06/17/2011   ALT 9 06/17/2011   AST 13 06/17/2011   NA 139 06/17/2011   K 4.4 06/17/2011   CL 103 06/17/2011   CREATININE 1.59* 06/17/2011   BUN 28* 06/17/2011   CO2 26 06/17/2011   PSA 1.28 09/05/2009   INR 1.08 06/10/2011   HGBA1C 6.2 12/04/2009    Ct Abdomen Pelvis Wo Contrast  05/28/2011  *RADIOLOGY REPORT*  Clinical Data:  Evaluate bilateral hydronephrosis.  Possible adenopathy on ultrasound.  CT CHEST, ABDOMEN AND PELVIS WITHOUT  CONTRAST  Technique:  Contiguous axial images  of the chest abdomen and pelvis were obtained without  IV contrast administration.  Comparison: Renal ultrasound of 05/26/2011.  Plain film chest of 05/07/2011.  No prior CT.  CT CHEST  Findings: Lung windows demonstrate mild tracheal deviation to the right, secondary to enlarged left lobe of the thyroid, to be detailed below.  No nodules or airspace opacities.  Soft tissue windows demonstrate left supraclavicular nodes, which measure up to 1.0 cm on image 6.  Diffuse thyroid enlargement.  Probable left-sided 3.6 cm mass on image 8.  Heterogeneous density of the right thyroid lobe.  Extensive thoracic adenopathy as follows: - Index right axillary node which measures 2.2 cm on image 14. - Left axillary node which measures 1.6 cm on image 19. - A subcarinal node which measures 1.6 x 2.9 cm on image 28. Limited evaluation for hilar adenopathy.  Possible left hilar and infrahilar soft tissue fullness.  Normal heart size with prior median sternotomy.  An esophageal air fluid level on image 21.  IMPRESSION:  1.  Thoracic and likely  low cervical adenopathy, most consistent with lymphoma. 2.  Left greater than right thyroid enlargement.  Suspect underlying lesions.  Further evaluation with thyroid ultrasound is recommended. 3. Esophageal air fluid level suggests dysmotility or gastroesophageal reflux.  CT ABDOMEN AND PELVIS  Findings:  Normal uninfused appearance of the liver, spleen, stomach, pancreas, gallbladder, biliary tract, adrenal glands. Mild left renal cortical thinning.  Moderate left-sided hydroureteronephrosis.  This continues to the level of the the pelvic brim, where the ureter undergoes a relatively gradual transition to decompressed more distally. There is increased density within the ureter at this level, most apparent on coronal image 47.  No right-sided urinary tract obstruction.  Extensive retroperitoneal adenopathy.  An index left periaortic conglomeration of nodes measures 2.8 x 3.0 cm on image 79.   Porta hepatis node is poorly visualized on image 59 but measures proximally 5.0 x 2.1 cm.  No retrocrural adenopathy.  Gastrohepatic ligament adenopathy is identified on image 54.  Normal colon, appendix, and terminal ileum.  Normal small bowel without abdominal ascites.  Marked pelvic adenopathy. - Index right external iliac node measures 1.8 x 3.6 cm on image 109. - Index right inguinal node measures 2.0 x 2.8 cm on image 121.  The bladder is decompressed.  Apparent wall thickening may be secondary.  Mild prostatomegaly, irregularity of the right L1 transverse process on image 67 could be developmental or related to remote trauma.  IMPRESSION:  1.  Marked abdominal pelvic adenopathy, most consistent with lymphoma. 2.  Moderate left-sided hydroureteronephrosis.  Continues to the level of the pelvic brim, where the ureter undergoes a gradual transition to decompressed.  Increased density within the ureter at this level.  Findings are favored to be related to lymphomatous involvement either within or surrounding the ureter.  A synchronous transitional cell carcinoma could look similar but is felt unlikely. 3.  Urinary bladder underdistension.  Apparent wall thickening is felt to be secondary.  Correlate with symptoms to suggest cystitis.  Original Report Authenticated By: Consuello Bossier, M.D.   Ct Chest Wo Contrast  05/28/2011  *RADIOLOGY REPORT*  Clinical Data:  Evaluate bilateral hydronephrosis.  Possible adenopathy on ultrasound.  CT CHEST, ABDOMEN AND PELVIS WITHOUT  CONTRAST  Technique:  Contiguous axial images of the chest abdomen and pelvis were obtained without  IV contrast administration.  Comparison: Renal ultrasound of 05/26/2011.  Plain film chest of 05/07/2011.  No prior CT.  CT CHEST  Findings: Lung windows demonstrate mild tracheal deviation to the right, secondary to enlarged left lobe of the thyroid, to be detailed below.  No nodules or airspace opacities.  Soft tissue windows demonstrate left  supraclavicular nodes, which measure up to 1.0 cm on image 6.  Diffuse thyroid enlargement.  Probable left-sided 3.6 cm mass on image 8.  Heterogeneous density of the  right thyroid lobe.  Extensive thoracic adenopathy as follows: - Index right axillary node which measures 2.2 cm on image 14. - Left axillary node which measures 1.6 cm on image 19. - A subcarinal node which measures 1.6 x 2.9 cm on image 28. Limited evaluation for hilar adenopathy.  Possible left hilar and infrahilar soft tissue fullness.  Normal heart size with prior median sternotomy.  An esophageal air fluid level on image 21.  IMPRESSION:  1.  Thoracic and likely  low cervical adenopathy, most consistent with lymphoma. 2.  Left greater than right thyroid enlargement.  Suspect underlying lesions.  Further evaluation with thyroid ultrasound is recommended. 3. Esophageal air fluid level suggests dysmotility or gastroesophageal reflux.  CT ABDOMEN AND PELVIS  Findings:  Normal uninfused appearance of the liver, spleen, stomach, pancreas, gallbladder, biliary tract, adrenal glands. Mild left renal cortical thinning.  Moderate left-sided hydroureteronephrosis.  This continues to the level of the the pelvic brim, where the ureter undergoes a relatively gradual transition to decompressed more distally. There is increased density within the ureter at this level, most apparent on coronal image 47.  No right-sided urinary tract obstruction.  Extensive retroperitoneal adenopathy.  An index left periaortic conglomeration of nodes measures 2.8 x 3.0 cm on image 79.  Porta hepatis node is poorly visualized on image 59 but measures proximally 5.0 x 2.1 cm.  No retrocrural adenopathy.  Gastrohepatic ligament adenopathy is identified on image 54.  Normal colon, appendix, and terminal ileum.  Normal small bowel without abdominal ascites.  Marked pelvic adenopathy. - Index right external iliac node measures 1.8 x 3.6 cm on image 109. - Index right inguinal node  measures 2.0 x 2.8 cm on image 121.  The bladder is decompressed.  Apparent wall thickening may be secondary.  Mild prostatomegaly, irregularity of the right L1 transverse process on image 67 could be developmental or related to remote trauma.  IMPRESSION:  1.  Marked abdominal pelvic adenopathy, most consistent with lymphoma. 2.  Moderate left-sided hydroureteronephrosis.  Continues to the level of the pelvic brim, where the ureter undergoes a gradual transition to decompressed.  Increased density within the ureter at this level.  Findings are favored to be related to lymphomatous involvement either within or surrounding the ureter.  A synchronous transitional cell carcinoma could look similar but is felt unlikely. 3.  Urinary bladder underdistension.  Apparent wall thickening is felt to be secondary.  Correlate with symptoms to suggest cystitis.  Original Report Authenticated By: Consuello Bossier, M.D.   US Soft Tissue Head/neck  06/10/2011  *RADIOLOGY REPORT*  Clinical Data: Recent diagnosis of Hodgkin's lymphoma.  Thyroid nodules.  THYROID ULTRASOUND  Technique: Ultrasound examination of the thyroid gland and adjacent soft tissues was performed.  Comparison:  CT chest 05/28/2011.  Findings:  Right thyroid lobe:  55 mm x 33 mm x 37 mm.  Large nodule.  See below. Left thyroid lobe:  62 mm x 37 mm x 42 mm.  Large nodule.  See below. Isthmus:  18 mm with nodule.  Multiple nodules are present however there is a rim of normal appearing thyroid tissue.  The rim of thyroid tissue has a relatively normal echotexture.  Focal nodules: 1. Left thyroid lobe 45 mm x 36 mm x 36 mm nodule.  This is hypervascular, with prominent vessels.  The nodule is predominately solid. 2. Right thyroid lobe 40 mm x 22 mm x 28 mm solid nodule, also hypervascular with prominent vessels. 3.  Nodule in the  thyroid isthmus measures 33 mm x 21 mm x 46 mm. This is solid and hypervascular.  Lymphadenopathy:  None visualized.  IMPRESSION: Bilateral  thyroid lobe and isthmic solid hypervascular thyroid nodules.  The nodules meet criteria for thyroid biopsy.  Original Report Authenticated By: Andreas Newport, M.D.   Nm Pet Image Initial (pi) Skull Base To Thigh  06/11/2011  *RADIOLOGY REPORT*  Clinical Data: Initial treatment strategy for Hodgkin's lymphoma.  NUCLEAR MEDICINE PET SKULL BASE TO THIGH  Fasting Blood Glucose:  105  Technique:  17.9 mCi F-18 FDG was injected intravenously. CT data was obtained and used for attenuation correction and anatomic localization only.  (This was not acquired as a diagnostic CT examination.) Additional exam technical data entered on technologist worksheet.  Comparison:  CT chest abdomen pelvis dated 05/28/2011  Findings:  Neck: Hypermetabolic bilateral cervical lymphadenopathy.  Right posterior intraparotid lymph node with max SUV 8.6 (PET image 17). More diffuse hypermetabolism anteriorly in the left parotid gland with max SUV 5.7.  Enlarged, heterogeneous thyroid gland without associated hypermetabolism.  Chest:  Bilateral axillary lymphadenopathy, max SUV 8.3 on the right and 7.5 on the left.  Small mediastinal lymph nodes without definite hypermetabolism.  No suspicious pulmonary nodules on the CT scan.  Abdomen/Pelvis:  Upper abdominal/porta hepatic lymphadenopathy with max SUV 6.1 (PET image 134) .  Retroperitoneal lymphadenopathy, including a left para-aortic node with max SUV 5.8 (PET image 163). Bilateral iliac chain lymphadenopathy, with max SUV 5.6 in the left external iliac nodal chain.  Bilateral inguinal lymphadenopathy, max SUV 10.9 on the right and 8.7 on the left.  Surgical clips in the right inguinal region.  Possible hypermetabolism in the right scrotum, max SUV 6.4 (PET image 266), lymphomatous involvement not excluded.  No abnormal hypermetabolic activity within the liver, pancreas, adrenal glands, or spleen.  Moderate left hydronephrosis. Extrinsic compression of the mid/distal ureter secondary to  lymphomatous involvement is suspected.  Bladder is thick-walled although underdistended.  Skeleton:  Focal hypermetabolism involving the L3 vertebral body extending into the left posterior elements, max SUV 9.1 (PET image 164).  Hypermetabolic lesion in the right iliac bone, max SUV 9.4 (PET image 211).  IMPRESSION: Hypermetabolic cervical, axillary, upper abdominal, retroperitoneal, and inguinal lymphadenopathy, compatible with known lymphoma, max SUV 10.9 in the right inguinal region.  Possible hypermetabolism in the right scrotum, max SUV 6.4, lymphomatous involvement not excluded.  Osseous metastases involving the L3 vertebral body and right iliac bone, max SUV 9.4.  Moderate left hydronephrosis.  Extrinsic compression of the mid/distal ureter secondary to lymphomatous involvement is suspected.  Enlarged, heterogeneous thyroid gland without associated hypermetabolism.  Original Report Authenticated By: Charline Bills, M.D.   Dg Chest Port 1 View  06/10/2011  *RADIOLOGY REPORT*  Clinical Data: Status post Port-A-Cath placement.  PORTABLE CHEST - 1 VIEW  Comparison: CT chest 05/28/2011 and PA and lateral chest 04/30/2011.  Findings: The patient has a new left subclavian approach Port-A- Cath.  Tip of the catheter is in the lower superior vena cava. There is no pneumothorax.  Heart size is normal.  Mediastinal lymphadenopathy noted.  IMPRESSION:  1.  Port-A-Cath in good position.  No pneumothorax. 2.  Mediastinal lymphadenopathy.  Original Report Authenticated By: Bernadene Bell. D'ALESSIO, M.D.   ASSESSMENT AND PLAN:   1. Hodgkin's lymphoma, stage III. The patient will proceed with cycle 1 day 15 of ABVD without dose reduction or delay. He is overall tolerated chemotherapy well without any significant side effects today. He is neutropenic with an ANC  of 0.8. He is afebrile and has no signs of infection. Will add Neulasta on day 2 and day 16 of each cycle of chemotherapy.  2. Neutropenia. Neulasta will be at  on day 2 and day 16 of each cycle of chemotherapy. Side effects of this medication discussed with the patient and his wife including arthralgias. He will take his pain medication as needed to control any side effects that he has.  3. Right flank pain. Pain is improved and he has not required any pain medications since hospital discharge.  4. Thyromegaly with thyroid nodule. Thyroid ultrasound performed on 06/10/2011 showed bilateral thyroid lobe and isthmic solid hypervascular thyroid nodules. The thyroid nodules met criteria for thyroid biopsy but the PET scan did not show any activity. Plan to pursue a thyroid biopsy by interventional radiology once chemotherapy is complete. This was explained to the patient and his wife and they are in agreement to this plan.  5. Hydronephrosis. Creatinine level is pending today. Most recent creatinine was improved to 1.59 on 06/17/2011.  6. Anemia. Most likely due to chronic inflammation versus chronic renal insufficiency versus chemotherapy. Hemoglobin remains stable he is not bleeding or symptomatic at this time. No blood transfusion is needed.  7. Hypertension. He'll continue on his metoprolol per PCP.  8. Hyperlipidemia. He'll continue on his Crestor per PCP.   The patient was seen and examined with Dr. Gaylyn Rong. >90% of plan of care developed by Dr Gaylyn Rong. The length of time of the face-to-face encounter was 30 minutes. More than 50% of time was spent counseling and coordination of care.

## 2011-06-25 NOTE — Patient Instructions (Signed)
Patient aware of next appointment; discharged home with wife and no complaints. 

## 2011-06-26 ENCOUNTER — Ambulatory Visit (HOSPITAL_BASED_OUTPATIENT_CLINIC_OR_DEPARTMENT_OTHER): Payer: BC Managed Care – PPO

## 2011-06-26 VITALS — BP 137/82 | HR 73 | Temp 97.4°F

## 2011-06-26 DIAGNOSIS — C8190 Hodgkin lymphoma, unspecified, unspecified site: Secondary | ICD-10-CM

## 2011-06-26 DIAGNOSIS — Z5189 Encounter for other specified aftercare: Secondary | ICD-10-CM

## 2011-06-26 MED ORDER — PEGFILGRASTIM INJECTION 6 MG/0.6ML
6.0000 mg | Freq: Once | SUBCUTANEOUS | Status: AC
  Administered 2011-06-26: 6 mg via SUBCUTANEOUS

## 2011-06-28 ENCOUNTER — Other Ambulatory Visit: Payer: Self-pay | Admitting: Certified Registered Nurse Anesthetist

## 2011-07-09 ENCOUNTER — Telehealth: Payer: Self-pay | Admitting: Oncology

## 2011-07-09 ENCOUNTER — Ambulatory Visit (HOSPITAL_BASED_OUTPATIENT_CLINIC_OR_DEPARTMENT_OTHER): Payer: BC Managed Care – PPO

## 2011-07-09 ENCOUNTER — Telehealth: Payer: Self-pay | Admitting: *Deleted

## 2011-07-09 ENCOUNTER — Encounter: Payer: Self-pay | Admitting: Oncology

## 2011-07-09 ENCOUNTER — Other Ambulatory Visit (HOSPITAL_BASED_OUTPATIENT_CLINIC_OR_DEPARTMENT_OTHER): Payer: BC Managed Care – PPO | Admitting: Lab

## 2011-07-09 ENCOUNTER — Ambulatory Visit (HOSPITAL_BASED_OUTPATIENT_CLINIC_OR_DEPARTMENT_OTHER): Payer: BC Managed Care – PPO | Admitting: Oncology

## 2011-07-09 VITALS — BP 119/75 | HR 83 | Temp 98.1°F | Ht 70.0 in | Wt 229.5 lb

## 2011-07-09 DIAGNOSIS — C8190 Hodgkin lymphoma, unspecified, unspecified site: Secondary | ICD-10-CM

## 2011-07-09 DIAGNOSIS — N1330 Unspecified hydronephrosis: Secondary | ICD-10-CM

## 2011-07-09 DIAGNOSIS — T451X5A Adverse effect of antineoplastic and immunosuppressive drugs, initial encounter: Secondary | ICD-10-CM

## 2011-07-09 DIAGNOSIS — Z5111 Encounter for antineoplastic chemotherapy: Secondary | ICD-10-CM

## 2011-07-09 DIAGNOSIS — D6481 Anemia due to antineoplastic chemotherapy: Secondary | ICD-10-CM

## 2011-07-09 DIAGNOSIS — E041 Nontoxic single thyroid nodule: Secondary | ICD-10-CM

## 2011-07-09 DIAGNOSIS — D696 Thrombocytopenia, unspecified: Secondary | ICD-10-CM

## 2011-07-09 LAB — CBC WITH DIFFERENTIAL/PLATELET
BASO%: 0.4 % (ref 0.0–2.0)
EOS%: 1.5 % (ref 0.0–7.0)
MCH: 24.7 pg — ABNORMAL LOW (ref 27.2–33.4)
MCHC: 32.3 g/dL (ref 32.0–36.0)
MONO#: 0.7 10*3/uL (ref 0.1–0.9)
RBC: 4.65 10*6/uL (ref 4.20–5.82)
WBC: 11.4 10*3/uL — ABNORMAL HIGH (ref 4.0–10.3)
lymph#: 3 10*3/uL (ref 0.9–3.3)
nRBC: 1 % — ABNORMAL HIGH (ref 0–0)

## 2011-07-09 LAB — COMPREHENSIVE METABOLIC PANEL
ALT: 12 U/L (ref 0–53)
AST: 19 U/L (ref 0–37)
Albumin: 4.1 g/dL (ref 3.5–5.2)
Alkaline Phosphatase: 79 U/L (ref 39–117)
BUN: 23 mg/dL (ref 6–23)
Calcium: 9.6 mg/dL (ref 8.4–10.5)
Chloride: 109 mEq/L (ref 96–112)
Creatinine, Ser: 1.71 mg/dL — ABNORMAL HIGH (ref 0.50–1.35)
Potassium: 4.5 mEq/L (ref 3.5–5.3)

## 2011-07-09 MED ORDER — SODIUM CHLORIDE 0.9 % IV SOLN
Freq: Once | INTRAVENOUS | Status: AC
  Administered 2011-07-09: 10:00:00 via INTRAVENOUS

## 2011-07-09 MED ORDER — VINBLASTINE SULFATE CHEMO INJECTION 1 MG/ML
6.0000 mg/m2 | Freq: Once | INTRAVENOUS | Status: AC
  Administered 2011-07-09: 14 mg via INTRAVENOUS
  Filled 2011-07-09: qty 14

## 2011-07-09 MED ORDER — DOXORUBICIN HCL CHEMO IV INJECTION 2 MG/ML
25.0000 mg/m2 | Freq: Once | INTRAVENOUS | Status: AC
  Administered 2011-07-09: 56 mg via INTRAVENOUS
  Filled 2011-07-09: qty 28

## 2011-07-09 MED ORDER — ONDANSETRON 16 MG/50ML IVPB (CHCC)
16.0000 mg | Freq: Once | INTRAVENOUS | Status: AC
  Administered 2011-07-09: 16 mg via INTRAVENOUS

## 2011-07-09 MED ORDER — SODIUM CHLORIDE 0.9 % IJ SOLN
10.0000 mL | INTRAMUSCULAR | Status: DC | PRN
  Administered 2011-07-09: 10 mL
  Filled 2011-07-09: qty 10

## 2011-07-09 MED ORDER — SODIUM CHLORIDE 0.9 % IV SOLN
10.0000 [IU]/m2 | Freq: Once | INTRAVENOUS | Status: AC
  Administered 2011-07-09: 23 [IU] via INTRAVENOUS
  Filled 2011-07-09: qty 7.7

## 2011-07-09 MED ORDER — DEXAMETHASONE SODIUM PHOSPHATE 4 MG/ML IJ SOLN
20.0000 mg | Freq: Once | INTRAMUSCULAR | Status: AC
  Administered 2011-07-09: 20 mg via INTRAVENOUS

## 2011-07-09 MED ORDER — SODIUM CHLORIDE 0.9 % IV SOLN
375.0000 mg/m2 | Freq: Once | INTRAVENOUS | Status: AC
  Administered 2011-07-09: 840 mg via INTRAVENOUS
  Filled 2011-07-09: qty 42

## 2011-07-09 MED ORDER — HEPARIN SOD (PORK) LOCK FLUSH 100 UNIT/ML IV SOLN
500.0000 [IU] | Freq: Once | INTRAVENOUS | Status: AC | PRN
  Administered 2011-07-09: 500 [IU]
  Filled 2011-07-09: qty 5

## 2011-07-09 NOTE — Telephone Encounter (Signed)
Gave pt calendar appt for May 2013 lab,chemo ,ML and MD. Jovita Gamma pt appt for PET scan on 08/05/11 npo 6 hrs prior to CT

## 2011-07-09 NOTE — Telephone Encounter (Signed)
Per staff message I have adjusted appts to follow behind MD/ML visits. Rose, schedudler aware.  JMW

## 2011-07-09 NOTE — Telephone Encounter (Signed)
Emailed Marcelino Duster, chemo schduler, regarding times of chemo that needs to be adjusted during ML and MD visit

## 2011-07-09 NOTE — Patient Instructions (Signed)
[facility] Discharge Instructions for Patients Receiving Chemotherapy  Today you received the following chemotherapy agents; Dacarbazine, Bleomycin, Adriamycin and Velban.  To help prevent nausea and vomiting after your treatment, we encourage you to take your nausea medication. Begin taking it as often as prescribed.   If you develop nausea and vomiting that is not controlled by your nausea medication, call the clinic at [phone].  If it is after clinic hours your family physician or the after hours number for the clinic or go to the Emergency Department.   BELOW ARE SYMPTOMS THAT SHOULD BE REPORTED IMMEDIATELY:  *FEVER GREATER THAN 100.5 F  *CHILLS WITH OR WITHOUT FEVER  NAUSEA AND VOMITING THAT IS NOT CONTROLLED WITH YOUR NAUSEA MEDICATION  *UNUSUAL SHORTNESS OF BREATH  *UNUSUAL BRUISING OR BLEEDING  TENDERNESS IN MOUTH AND THROAT WITH OR WITHOUT PRESENCE OF ULCERS  *URINARY PROBLEMS  *BOWEL PROBLEMS  UNUSUAL RASH Items with * indicate a potential emergency and should be followed up as soon as possible.  One of the nurses will contact you 24 hours after your treatment. Please let the nurse know about any problems that you may have experienced. Feel free to call the clinic you have any questions or concerns. The clinic phone number is [phone].   I have been informed and understand all the instructions given to me. I know to contact the clinic, my physician, or go to the Emergency Department if any problems should occur. I do not have any questions at this time, but understand that I may call the clinic during office hours   should I have any questions or need assistance in obtaining follow up care.    __________________________________________  _____________  __________ Signature of Patient or Authorized Representative            Date                   Time    __________________________________________ Nurse's Signature

## 2011-07-09 NOTE — Progress Notes (Signed)
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   CURRENT THERAPY:  Started on 06/11/2011 with d1, d15, ABVD with Neulasta injection the day after.  Plan is for minimal of 6 cycles total.   INTERVAL HISTORY: Devon Brady 61 y.o. male returns for regular follow up with his wife.  He is here for cycle #2, day#1 of chemo.  He has been doing well.  He has had some tingling sensation in his tongue that lasted about 2 days after chemo.  He denied neuropathy in the fingers, toes.  He has been able to carry out all activities of daily of daily living including mowing the yard.  He no longer can feel any node swelling.  Patient denies fever, anorexia, weight loss, fatigue, headache, visual changes, confusion, drenching night sweats, palpable lymph node swelling, mucositis, odynophagia, dysphagia, nausea vomiting, jaundice, chest pain, palpitation, shortness of breath, dyspnea on exertion, productive cough, gum bleeding, epistaxis, hematemesis, hemoptysis, abdominal pain, abdominal swelling, early satiety, melena, hematochezia, hematuria, skin rash, spontaneous bleeding, joint swelling, joint pain, heat or cold intolerance, bowel bladder incontinence, back pain, focal motor weakness, paresthesia, depression, suicidal or homocidal ideation, feeling hopelessness.   Past Medical History  Diagnosis Date  . Hypertension   . Hyperlipidemia   . CAD (coronary artery disease)     s/p 5V CABG 8/11  . RCA occlusion   . Multiple thyroid nodules   . Anemia   . Lymphocytosis   . Lymphadenopathy   . Hydronephrosis   . Renal insufficiency   . Complication of anesthesia     slow to wake up  . Dizziness     occasional for last week  . Hemorrhoid   . Sleep apnea     STOPBANG=5  . Hodgkin's  lymphoma     Past Surgical History  Procedure Date  . Vasectomy 1980  . Bunionectomy 1978    right foot  . Cardiac catheterization 10/15/2009  . Coronary artery bypass graft 10/17/2009    LIMA to LAD,SVG to Ramus,SVG to OM sequential to OM2, SVG to marginal of RCA  . Tonsillectomy as child  . Portacath placement 06/10/2011    Procedure: INSERTION PORT-A-CATH;  Surgeon: Almond Lint, MD;  Location: [facility];  Service: General;  Laterality: Left;  subclavian     Current Outpatient Prescriptions  Medication Sig Dispense Refill  . aspirin 81 MG tablet Take 81 mg by mouth.       . cetirizine (ZYRTEC) 10 MG tablet Take 10 mg by mouth daily.      . fluticasone (FLONASE) 50 MCG/ACT nasal spray Place 2 sprays into the nose as needed. Allergies       . HYDROcodone-acetaminophen (NORCO) 5-325 MG per tablet 1 tablet Every 6 hours as needed.      . lidocaine-prilocaine (EMLA) cream APPLY TO PAC 1- 2 HOURS PRIOR TO ACCESS.  30 g  1  . LORazepam (ATIVAN) 0.5 MG tablet Take 1 tablet (0.5 mg total) by mouth every 6 (six) hours as needed (Nausea or vomiting).  30 tablet  0  . metoprolol succinate (TOPROL-XL) 50 MG 24 hr tablet       . metoprolol succinate (TOPROL-XL) 50 MG 24 hr tablet TAKE 1 TABLET EVERY DAY  30 tablet  2  .  Multiple Vitamins-Iron (MULTIVITAMINS WITH IRON) TABS Take 1 tablet by mouth daily.      . Omega-3 Fatty Acids (FISH OIL) 1000 MG CAPS Take 1 capsule by mouth 3 (three) times daily with meals.       Marland Kitchen omeprazole (PRILOSEC) 40 MG capsule Take 40 mg by mouth every morning.       . ondansetron (ZOFRAN) 8 MG tablet Take 1 tab two times a day starting the day after chemo for 3 days. Then take 1 tab two times a day as needed for nausea or vomiting.  30 tablet  1  . prochlorperazine (COMPAZINE) 10 MG tablet Take 1 tablet (10 mg total) by mouth every 6 (six) hours as needed (Nausea or vomiting).  30 tablet  1  . rosuvastatin (CRESTOR) 20 MG tablet Take 20 mg by mouth every evening.      Marland Kitchen  DISCONTD: citalopram (CELEXA) 20 MG tablet Take 1 tablet (20 mg total) by mouth daily.  30 tablet  1  . DISCONTD: omeprazole (PRILOSEC OTC) 20 MG tablet Take 1 tablet (20 mg total) by mouth daily.  28 tablet  1    ALLERGIES:   has no known allergies.  REVIEW OF SYSTEMS:  The rest of the 14-point review of system was negative.   Filed Vitals:   07/09/11 0836  BP: 119/75  Pulse: 83  Temp: 98.1 F (36.7 C)   Wt Readings from Last 3 Encounters:  07/09/11 229 lb 8 oz (104.101 kg)  06/25/11 228 lb 4.8 oz (103.556 kg)  06/09/11 227 lb (102.967 kg)   ECOG Performance status: 0  PHYSICAL EXAMINATION:  General:  well-nourished man in no acute distress.  Eyes:  no scleral icterus.  ENT:  There were no oropharyngeal lesions.  His tongue was normal. Neck was without thyromegaly.  Lymphatics: I could not feel any cervical, supraclavicular or axillary adenopathy.  Respiratory: lungs were clear bilaterally without wheezing or crackles.  Cardiovascular:  Regular rate and rhythm, S1/S2, without murmur, rub or gallop.  There was no pedal edema.  GI:  abdomen was soft, flat, nontender, nondistended, without organomegaly.  Muscoloskeletal:  no spinal tenderness of palpation of vertebral spine.  Skin exam was without echymosis, petichae.  Neuro exam was nonfocal.  Patient was able to get on and off exam table without assistance.  Gait was normal.  Patient was alerted and oriented.  Attention was good.   Language was appropriate.  Mood was normal without depression.  Speech was not pressured.  Thought content was not tangential.      LABORATORY/RADIOLOGY DATA:  CBC    Component Value Date/Time   WBC 11.4* 07/09/2011 0806   WBC 12.9* 06/11/2011 0540   RBC 4.65 07/09/2011 0806   RBC 4.15* 06/11/2011 0540   HGB 11.5* 07/09/2011 0806   HGB 9.4* 06/11/2011 0540   HCT 35.6* 07/09/2011 0806   HCT 31.4* 06/11/2011 0540   PLT 133* 07/09/2011 0806   PLT 340 06/11/2011 0540   MCV 76.6* 07/09/2011 0806   MCV 75.7*  06/11/2011 0540   MCH 24.7* 07/09/2011 0806   MCH 22.7* 06/11/2011 0540   MCHC 32.3 07/09/2011 0806   MCHC 29.9* 06/11/2011 0540   RDW 21.8* 07/09/2011 0806   RDW 18.6* 06/11/2011 0540   LYMPHSABS 3.0 07/09/2011 0806   LYMPHSABS 1.9 11/02/2010 1645   MONOABS 0.7 07/09/2011 0806   MONOABS 0.4 11/02/2010 1645   EOSABS 0.2 07/09/2011 0806   EOSABS 0.1 11/02/2010 1645   BASOSABS 0.1 07/09/2011  1478   BASOSABS 0.0 11/02/2010 1645   PET:  I personally reviewed the PET scan performed on 06/11/2011 and showed the patient and his wife the images.   There was diffuse disease in the cervical/axilalary, inguinal , retroperitoneal, L3, iliac bones.   ASSESSMENT AND PLAN:   1. Hodgkin's lymphoma, stage IV, high risk:  He is s/p cycle#1 of chemo ABVD with clinical response.  He only has grade one mouth sore which has resolved.  He has grade 1 anemia and thrombocytopenia.  None of these warrants dose adjustment of chemo.  I advised him to proceed with chemo today and return tomorrow for Neulasta tomorrow to decrease the risk of neutropenic fever.  After the first cycle, he had grade 3 neutropenia.  I want to avoid neutropenic fever and delay of chemo by giving Neulasta.  I scheduled for a restaging PET scan on 08/05/2011 (after 2 cycles of ABVD).   2. Anemia and thrombocytopenia:  Due to chemo.  There is no active bleeding.  This is grade 1.  There is no indication for transfusion.   3. Right flank pain. Pain is improved and he has not required any pain medications since hospital discharge.   4. Thyromegaly with thyroid nodule. Thyroid ultrasound performed on 06/10/2011 showed bilateral thyroid lobe and isthmic solid hypervascular thyroid nodules. The thyroid nodules met criteria for thyroid biopsy but the PET scan did not show any activity. Plan to pursue a thyroid biopsy by interventional radiology once chemotherapy is complete.   5. Hydronephrosis. Creatinine level is pending today. Most recent creatinine was improved  to 1.49 on 06/25/2011.   6. Hypertension. Well controlled on metoprolol per PCP.   7. Hyperlipidemia. He'll continue on his Crestor per PCP.   8.  Follow up:  In 2 weeks for cycle #2, day 15 of ABVD.

## 2011-07-10 ENCOUNTER — Ambulatory Visit (HOSPITAL_BASED_OUTPATIENT_CLINIC_OR_DEPARTMENT_OTHER): Payer: BC Managed Care – PPO

## 2011-07-10 VITALS — BP 150/87 | HR 80 | Temp 97.3°F

## 2011-07-10 DIAGNOSIS — C8190 Hodgkin lymphoma, unspecified, unspecified site: Secondary | ICD-10-CM

## 2011-07-10 DIAGNOSIS — Z5189 Encounter for other specified aftercare: Secondary | ICD-10-CM

## 2011-07-10 MED ORDER — PEGFILGRASTIM INJECTION 6 MG/0.6ML
6.0000 mg | Freq: Once | SUBCUTANEOUS | Status: AC
  Administered 2011-07-10: 6 mg via SUBCUTANEOUS

## 2011-07-10 NOTE — Patient Instructions (Signed)
Call MD for problems 

## 2011-07-14 ENCOUNTER — Telehealth: Payer: Self-pay | Admitting: *Deleted

## 2011-07-14 ENCOUNTER — Telehealth: Payer: Self-pay | Admitting: Oncology

## 2011-07-14 NOTE — Telephone Encounter (Signed)
Talked to pt gave him appt for month of may 2013 lab, md and chemo

## 2011-07-14 NOTE — Telephone Encounter (Signed)
Pt called first stating needs letter from Dr. Gaylyn Rong to return to work.  Says they need to know if he has any restrictions?  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  After we talked for a few minutes,  Pt says he may wait to return to work until after his PET scan.  He will discuss it w/ Dr. Gaylyn Rong on his next visit on 5/10.  Instructed pt to call if he needs a letter sooner or any other needs.  Pt verbalized understanding.

## 2011-07-22 NOTE — Progress Notes (Signed)
Adriamycin on 06/25/11 end time should reflect 1213hr instead of 1413hr. Doc error noted by nurse. HL

## 2011-07-23 ENCOUNTER — Ambulatory Visit (HOSPITAL_BASED_OUTPATIENT_CLINIC_OR_DEPARTMENT_OTHER): Payer: BC Managed Care – PPO

## 2011-07-23 ENCOUNTER — Other Ambulatory Visit (HOSPITAL_BASED_OUTPATIENT_CLINIC_OR_DEPARTMENT_OTHER): Payer: BC Managed Care – PPO | Admitting: Lab

## 2011-07-23 ENCOUNTER — Ambulatory Visit (HOSPITAL_BASED_OUTPATIENT_CLINIC_OR_DEPARTMENT_OTHER): Payer: BC Managed Care – PPO | Admitting: Oncology

## 2011-07-23 ENCOUNTER — Encounter: Payer: Self-pay | Admitting: Oncology

## 2011-07-23 VITALS — BP 115/80 | HR 78 | Temp 97.2°F | Ht 70.0 in | Wt 232.9 lb

## 2011-07-23 DIAGNOSIS — D6481 Anemia due to antineoplastic chemotherapy: Secondary | ICD-10-CM

## 2011-07-23 DIAGNOSIS — C8190 Hodgkin lymphoma, unspecified, unspecified site: Secondary | ICD-10-CM

## 2011-07-23 DIAGNOSIS — Z5111 Encounter for antineoplastic chemotherapy: Secondary | ICD-10-CM

## 2011-07-23 DIAGNOSIS — E049 Nontoxic goiter, unspecified: Secondary | ICD-10-CM

## 2011-07-23 DIAGNOSIS — D6959 Other secondary thrombocytopenia: Secondary | ICD-10-CM

## 2011-07-23 LAB — COMPREHENSIVE METABOLIC PANEL
AST: 18 U/L (ref 0–37)
Alkaline Phosphatase: 91 U/L (ref 39–117)
CO2: 24 mEq/L (ref 19–32)
Chloride: 107 mEq/L (ref 96–112)
Creatinine, Ser: 1.44 mg/dL — ABNORMAL HIGH (ref 0.50–1.35)
Glucose, Bld: 87 mg/dL (ref 70–99)
Potassium: 4.5 mEq/L (ref 3.5–5.3)
Sodium: 140 mEq/L (ref 135–145)
Total Bilirubin: 0.3 mg/dL (ref 0.3–1.2)

## 2011-07-23 LAB — CBC WITH DIFFERENTIAL/PLATELET
Basophils Absolute: 0.1 10*3/uL (ref 0.0–0.1)
Eosinophils Absolute: 0.3 10*3/uL (ref 0.0–0.5)
HGB: 12.2 g/dL — ABNORMAL LOW (ref 13.0–17.1)
MCV: 78.1 fL — ABNORMAL LOW (ref 79.3–98.0)
MONO#: 0.7 10*3/uL (ref 0.1–0.9)
MONO%: 6.1 % (ref 0.0–14.0)
NEUT#: 7.3 10*3/uL — ABNORMAL HIGH (ref 1.5–6.5)
RBC: 4.88 10*6/uL (ref 4.20–5.82)
RDW: 23.2 % — ABNORMAL HIGH (ref 11.0–14.6)
WBC: 11.4 10*3/uL — ABNORMAL HIGH (ref 4.0–10.3)
nRBC: 0 % (ref 0–0)

## 2011-07-23 MED ORDER — ONDANSETRON 16 MG/50ML IVPB (CHCC)
16.0000 mg | Freq: Once | INTRAVENOUS | Status: AC
  Administered 2011-07-23: 16 mg via INTRAVENOUS

## 2011-07-23 MED ORDER — DEXAMETHASONE SODIUM PHOSPHATE 4 MG/ML IJ SOLN
20.0000 mg | Freq: Once | INTRAMUSCULAR | Status: AC
  Administered 2011-07-23: 20 mg via INTRAVENOUS

## 2011-07-23 MED ORDER — SODIUM CHLORIDE 0.9 % IV SOLN
10.0000 [IU]/m2 | Freq: Once | INTRAVENOUS | Status: AC
  Administered 2011-07-23: 23 [IU] via INTRAVENOUS
  Filled 2011-07-23: qty 7.7

## 2011-07-23 MED ORDER — VINBLASTINE SULFATE CHEMO INJECTION 1 MG/ML
6.0000 mg/m2 | Freq: Once | INTRAVENOUS | Status: AC
  Administered 2011-07-23: 14 mg via INTRAVENOUS
  Filled 2011-07-23: qty 14

## 2011-07-23 MED ORDER — SODIUM CHLORIDE 0.9 % IV SOLN
Freq: Once | INTRAVENOUS | Status: AC
  Administered 2011-07-23: 12:00:00 via INTRAVENOUS

## 2011-07-23 MED ORDER — HEPARIN SOD (PORK) LOCK FLUSH 100 UNIT/ML IV SOLN
500.0000 [IU] | Freq: Once | INTRAVENOUS | Status: AC | PRN
  Administered 2011-07-23: 500 [IU]
  Filled 2011-07-23: qty 5

## 2011-07-23 MED ORDER — SODIUM CHLORIDE 0.9 % IJ SOLN
10.0000 mL | INTRAMUSCULAR | Status: DC | PRN
  Administered 2011-07-23: 10 mL
  Filled 2011-07-23: qty 10

## 2011-07-23 MED ORDER — SODIUM CHLORIDE 0.9 % IV SOLN
375.0000 mg/m2 | Freq: Once | INTRAVENOUS | Status: AC
  Administered 2011-07-23: 840 mg via INTRAVENOUS
  Filled 2011-07-23: qty 42

## 2011-07-23 MED ORDER — DOXORUBICIN HCL CHEMO IV INJECTION 2 MG/ML
25.0000 mg/m2 | Freq: Once | INTRAVENOUS | Status: AC
  Administered 2011-07-23: 56 mg via INTRAVENOUS
  Filled 2011-07-23: qty 28

## 2011-07-23 NOTE — Patient Instructions (Addendum)
[facility] Discharge Instructions for Patients Receiving Chemotherapy  Today you received the following chemotherapy agents Adriamycin, Vinblastine, Bleomycin and Dacarbazine.  To help prevent nausea and vomiting after your treatment, we encourage you to take your nausea medication as ordered per MD.    If you develop nausea and vomiting that is not controlled by your nausea medication, call the clinic. If it is after clinic hours your family physician or the after hours number for the clinic or go to the Emergency Department.   BELOW ARE SYMPTOMS THAT SHOULD BE REPORTED IMMEDIATELY:  *FEVER GREATER THAN 100.5 F  *CHILLS WITH OR WITHOUT FEVER  NAUSEA AND VOMITING THAT IS NOT CONTROLLED WITH YOUR NAUSEA MEDICATION  *UNUSUAL SHORTNESS OF BREATH  *UNUSUAL BRUISING OR BLEEDING  TENDERNESS IN MOUTH AND THROAT WITH OR WITHOUT PRESENCE OF ULCERS  *URINARY PROBLEMS  *BOWEL PROBLEMS  UNUSUAL RASH Items with * indicate a potential emergency and should be followed up as soon as possible. . Please let the nurse know about any problems that you may have experienced. Feel free to call the clinic you have any questions or concerns. The clinic phone number is [phone].   I have been informed and understand all the instructions given to me. I know to contact the clinic, my physician, or go to the Emergency Department if any problems should occur. I do not have any questions at this time, but understand that I may call the clinic during office hours   should I have any questions or need assistance in obtaining follow up care.    __________________________________________  _____________  __________ Signature of Patient or Authorized Representative            Date                   Time    __________________________________________ Nurse's Signature

## 2011-07-23 NOTE — Progress Notes (Signed)
[facility]  Telephone:(336) [phone] Fax:(336) [phone]   OFFICE PROGRESS NOTE   Cc:  Henri Medal, MD, MD  DIAGNOSIS: stage IV Hodgkin's lymphoma (involvement of bone); IPS of 5 (alb <4; Hgb <10.5; Male, age >54; WBC >15K) or high risk.   CURRENT THERAPY:  Started on 06/11/2011 with d1, d15, ABVD with Neulasta injection the day after.  Plan is for minimal of 6 cycles total.   INTERVAL HISTORY: Devon Brady 61 y.o. male returns for regular follow up with his wife.  He is here for cycle #2, day#15 of chemo.  He has been doing well. Energy level is good. Working from home, but wants to go back to work full-time. He denied neuropathy in the fingers, toes.  He has been able to carry out all activities of daily of daily living including mowing the yard.  He no longer can feel any node swelling. Denies chest pain, shortness of breath, nausea, vomiting. No fevers or night sweats. No hemoptysis, hematemesis, hematuria, or melena.  Patient denies fever, anorexia, weight loss, fatigue, headache, visual changes, confusion, drenching night sweats, palpable lymph node swelling, mucositis, odynophagia, dysphagia, nausea vomiting, jaundice, chest pain, palpitation, shortness of breath, dyspnea on exertion, productive cough, gum bleeding, epistaxis, hematemesis, hemoptysis, abdominal pain, abdominal swelling, early satiety, melena, hematochezia, hematuria, skin rash, spontaneous bleeding, joint swelling, joint pain, heat or cold intolerance, bowel bladder incontinence, back pain, focal motor weakness, paresthesia, depression, suicidal or homocidal ideation, feeling hopelessness.   Past Medical History  Diagnosis Date  . Hypertension   . Hyperlipidemia   . CAD (coronary artery disease)     s/p 5V CABG 8/11  . RCA occlusion   . Multiple thyroid nodules   . Anemia   . Lymphocytosis   . Lymphadenopathy   . Hydronephrosis   . Renal insufficiency   . Complication of anesthesia      slow to wake up  . Dizziness     occasional for last week  . Hemorrhoid   . Sleep apnea     STOPBANG=5  . Hodgkin's lymphoma     Past Surgical History  Procedure Date  . Vasectomy 1980  . Bunionectomy 1978    right foot  . Cardiac catheterization 10/15/2009  . Coronary artery bypass graft 10/17/2009    LIMA to LAD,SVG to Ramus,SVG to OM sequential to OM2, SVG to marginal of RCA  . Tonsillectomy as child  . Portacath placement 06/10/2011    Procedure: INSERTION PORT-A-CATH;  Surgeon: Almond Lint, MD;  Location: [facility];  Service: General;  Laterality: Left;  subclavian     Current Outpatient Prescriptions  Medication Sig Dispense Refill  . aspirin 81 MG tablet Take 81 mg by mouth.       . cetirizine (ZYRTEC) 10 MG tablet Take 10 mg by mouth daily.      . fluticasone (FLONASE) 50 MCG/ACT nasal spray Place 2 sprays into the nose as needed. Allergies       . HYDROcodone-acetaminophen (NORCO) 5-325 MG per tablet 1 tablet Every 6 hours as needed.      . lidocaine-prilocaine (EMLA) cream APPLY TO PAC 1- 2 HOURS PRIOR TO ACCESS.  30 g  1  . LORazepam (ATIVAN) 0.5 MG tablet Take 1 tablet (0.5 mg total) by mouth every 6 (six) hours as needed (Nausea or vomiting).  30 tablet  0  . metoprolol succinate (TOPROL-XL) 50 MG 24 hr tablet       . metoprolol succinate (TOPROL-XL) 50  MG 24 hr tablet TAKE 1 TABLET EVERY DAY  30 tablet  2  . Multiple Vitamins-Iron (MULTIVITAMINS WITH IRON) TABS Take 1 tablet by mouth daily.      . Omega-3 Fatty Acids (FISH OIL) 1000 MG CAPS Take 1 capsule by mouth 3 (three) times daily with meals.       Marland Kitchen omeprazole (PRILOSEC) 40 MG capsule Take 40 mg by mouth every morning.       . ondansetron (ZOFRAN) 8 MG tablet Take 1 tab two times a day starting the day after chemo for 3 days. Then take 1 tab two times a day as needed for nausea or vomiting.  30 tablet  1  . prochlorperazine (COMPAZINE) 10 MG tablet Take 1 tablet (10 mg total) by mouth every 6 (six) hours as  needed (Nausea or vomiting).  30 tablet  1  . rosuvastatin (CRESTOR) 20 MG tablet Take 20 mg by mouth every evening.      Marland Kitchen DISCONTD: citalopram (CELEXA) 20 MG tablet Take 1 tablet (20 mg total) by mouth daily.  30 tablet  1  . DISCONTD: omeprazole (PRILOSEC OTC) 20 MG tablet Take 1 tablet (20 mg total) by mouth daily.  28 tablet  1    ALLERGIES:   has no known allergies.  REVIEW OF SYSTEMS:  The rest of the 14-point review of system was negative.   Filed Vitals:   07/23/11 1028  BP: 115/80  Pulse: 78  Temp: 97.2 F (36.2 C)   Wt Readings from Last 3 Encounters:  07/23/11 232 lb 14.4 oz (105.643 kg)  07/09/11 229 lb 8 oz (104.101 kg)  06/25/11 228 lb 4.8 oz (103.556 kg)   ECOG Performance status: 0  PHYSICAL EXAMINATION:  General:  well-nourished man in no acute distress.  Eyes:  no scleral icterus.  ENT:  There were no oropharyngeal lesions.  His tongue was normal. Neck was without thyromegaly.  Lymphatics: I could not feel any cervical, supraclavicular or axillary adenopathy.  Respiratory: lungs were clear bilaterally without wheezing or crackles.  Cardiovascular:  Regular rate and rhythm, S1/S2, without murmur, rub or gallop.  There was no pedal edema.  GI:  abdomen was soft, flat, nontender, nondistended, without organomegaly.  Muscoloskeletal:  no spinal tenderness of palpation of vertebral spine.  Skin exam was without echymosis, petichae.  Neuro exam was nonfocal.  Patient was able to get on and off exam table without assistance.  Gait was normal.  Patient was alerted and oriented.  Attention was good.   Language was appropriate.  Mood was normal without depression.  Speech was not pressured.  Thought content was not tangential.    LABORATORY/RADIOLOGY DATA:  CBC    Component Value Date/Time   WBC 11.4* 07/23/2011 1018   WBC 12.9* 06/11/2011 0540   RBC 4.88 07/23/2011 1018   RBC 4.15* 06/11/2011 0540   HGB 12.2* 07/23/2011 1018   HGB 9.4* 06/11/2011 0540   HCT 38.1* 07/23/2011  1018   HCT 31.4* 06/11/2011 0540   PLT 232 07/23/2011 1018   PLT 340 06/11/2011 0540   MCV 78.1* 07/23/2011 1018   MCV 75.7* 06/11/2011 0540   MCH 25.0* 07/23/2011 1018   MCH 22.7* 06/11/2011 0540   MCHC 32.0 07/23/2011 1018   MCHC 29.9* 06/11/2011 0540   RDW 23.2* 07/23/2011 1018   RDW 18.6* 06/11/2011 0540   LYMPHSABS 3.1 07/23/2011 1018   LYMPHSABS 1.9 11/02/2010 1645   MONOABS 0.7 07/23/2011 1018   MONOABS 0.4 11/02/2010 1645  EOSABS 0.3 07/23/2011 1018   EOSABS 0.1 11/02/2010 1645   BASOSABS 0.1 07/23/2011 1018   BASOSABS 0.0 11/02/2010 1645   ASSESSMENT AND PLAN:   1. Hodgkin's lymphoma, stage IV, high risk:  He is s/p cycle#1 of chemo ABVD with clinical response.  Anemia and thrombocytopenia have resolved. I advised him to proceed with chemo today without dose modification and return tomorrow for Neulasta tomorrow to decrease the risk of neutropenic fever.  After the first cycle, he had grade 3 neutropenia. And is now receiving Neulasta. He is scheduled for a restaging PET scan on 08/05/2011 (after 2 cycles of ABVD).   2. Anemia and thrombocytopenia:  Due to chemo.  There is no active bleeding.  This is grade 1 and improved.  There is no indication for transfusion.   3. Right flank pain. Pain is improved and he has not required any pain medications since hospital discharge.   4. Thyromegaly with thyroid nodule. Thyroid ultrasound performed on 06/10/2011 showed bilateral thyroid lobe and isthmic solid hypervascular thyroid nodules. The thyroid nodules met criteria for thyroid biopsy but the PET scan did not show any activity. Plan to pursue a thyroid biopsy by interventional radiology once chemotherapy is complete.   5. Hydronephrosis. Creatinine level is pending today. Most recent creatinine was stable at 1.7.   6. Hypertension. Well controlled on metoprolol per PCP.   7. Hyperlipidemia. He'll continue on his Crestor per PCP.   8.  Follow up:  In 2 weeks for cycle #3, day 1 of ABVD.

## 2011-07-24 ENCOUNTER — Ambulatory Visit (HOSPITAL_BASED_OUTPATIENT_CLINIC_OR_DEPARTMENT_OTHER): Payer: BC Managed Care – PPO

## 2011-07-24 VITALS — BP 139/80 | HR 90 | Temp 97.1°F

## 2011-07-24 DIAGNOSIS — Z5189 Encounter for other specified aftercare: Secondary | ICD-10-CM

## 2011-07-24 DIAGNOSIS — C8190 Hodgkin lymphoma, unspecified, unspecified site: Secondary | ICD-10-CM

## 2011-07-24 MED ORDER — PEGFILGRASTIM INJECTION 6 MG/0.6ML
6.0000 mg | Freq: Once | SUBCUTANEOUS | Status: AC
  Administered 2011-07-24: 6 mg via SUBCUTANEOUS

## 2011-07-30 ENCOUNTER — Encounter: Payer: Self-pay | Admitting: Oncology

## 2011-07-30 NOTE — Progress Notes (Signed)
07/30/2011 Good Morning Ha,  I received a call late Thursday concerning Devon Brady.  [employer] has this patient's PET [facility] pending for peer-to-peer review.  I will cal them this morning and attempt to get this authorized.  I will keep your nurse informed on this.  Bonita Quin [id number]

## 2011-08-05 ENCOUNTER — Encounter (HOSPITAL_COMMUNITY)
Admission: RE | Admit: 2011-08-05 | Discharge: 2011-08-05 | Disposition: A | Discharge status: Home / Self Care | Payer: BC Managed Care – PPO | Source: Ambulatory Visit | Attending: Oncology | Admitting: Oncology

## 2011-08-05 ENCOUNTER — Other Ambulatory Visit (HOSPITAL_COMMUNITY): Payer: BC Managed Care – PPO

## 2011-08-05 DIAGNOSIS — C8190 Hodgkin lymphoma, unspecified, unspecified site: Secondary | ICD-10-CM | POA: Insufficient documentation

## 2011-08-05 MED ORDER — FLUDEOXYGLUCOSE F - 18 (FDG) INJECTION
14.9000 | Freq: Once | INTRAVENOUS | Status: AC | PRN
  Administered 2011-08-05: 14.9 via INTRAVENOUS

## 2011-08-06 ENCOUNTER — Telehealth: Payer: Self-pay | Admitting: Oncology

## 2011-08-06 ENCOUNTER — Ambulatory Visit (HOSPITAL_BASED_OUTPATIENT_CLINIC_OR_DEPARTMENT_OTHER): Payer: BC Managed Care – PPO

## 2011-08-06 ENCOUNTER — Ambulatory Visit (HOSPITAL_BASED_OUTPATIENT_CLINIC_OR_DEPARTMENT_OTHER): Payer: BC Managed Care – PPO | Admitting: Oncology

## 2011-08-06 ENCOUNTER — Other Ambulatory Visit (HOSPITAL_BASED_OUTPATIENT_CLINIC_OR_DEPARTMENT_OTHER): Payer: BC Managed Care – PPO

## 2011-08-06 VITALS — BP 121/78 | HR 85 | Temp 97.7°F | Ht 70.0 in | Wt 233.5 lb

## 2011-08-06 DIAGNOSIS — C8190 Hodgkin lymphoma, unspecified, unspecified site: Secondary | ICD-10-CM

## 2011-08-06 DIAGNOSIS — E041 Nontoxic single thyroid nodule: Secondary | ICD-10-CM

## 2011-08-06 DIAGNOSIS — T451X5A Adverse effect of antineoplastic and immunosuppressive drugs, initial encounter: Secondary | ICD-10-CM

## 2011-08-06 DIAGNOSIS — Z5111 Encounter for antineoplastic chemotherapy: Secondary | ICD-10-CM

## 2011-08-06 LAB — CBC WITH DIFFERENTIAL/PLATELET
Basophils Absolute: 0.1 10*3/uL (ref 0.0–0.1)
Eosinophils Absolute: 0.3 10*3/uL (ref 0.0–0.5)
HCT: 38.5 % (ref 38.4–49.9)
HGB: 12.6 g/dL — ABNORMAL LOW (ref 13.0–17.1)
LYMPH%: 21.6 % (ref 14.0–49.0)
MCV: 78.9 fL — ABNORMAL LOW (ref 79.3–98.0)
MONO#: 0.7 10*3/uL (ref 0.1–0.9)
MONO%: 6.7 % (ref 0.0–14.0)
NEUT#: 7.1 10*3/uL — ABNORMAL HIGH (ref 1.5–6.5)
Platelets: 237 10*3/uL (ref 140–400)
RBC: 4.88 10*6/uL (ref 4.20–5.82)
WBC: 10.4 10*3/uL — ABNORMAL HIGH (ref 4.0–10.3)

## 2011-08-06 LAB — COMPREHENSIVE METABOLIC PANEL
Albumin: 4.2 g/dL (ref 3.5–5.2)
BUN: 24 mg/dL — ABNORMAL HIGH (ref 6–23)
CO2: 27 mEq/L (ref 19–32)
Glucose, Bld: 92 mg/dL (ref 70–99)
Sodium: 142 mEq/L (ref 135–145)
Total Bilirubin: 0.3 mg/dL (ref 0.3–1.2)
Total Protein: 6.6 g/dL (ref 6.0–8.3)

## 2011-08-06 MED ORDER — SODIUM CHLORIDE 0.9 % IV SOLN
10.0000 [IU]/m2 | Freq: Once | INTRAVENOUS | Status: AC
  Administered 2011-08-06: 23 [IU] via INTRAVENOUS
  Filled 2011-08-06: qty 7.7

## 2011-08-06 MED ORDER — SODIUM CHLORIDE 0.9 % IV SOLN
375.0000 mg/m2 | Freq: Once | INTRAVENOUS | Status: AC
  Administered 2011-08-06: 840 mg via INTRAVENOUS
  Filled 2011-08-06: qty 42

## 2011-08-06 MED ORDER — DEXAMETHASONE SODIUM PHOSPHATE 4 MG/ML IJ SOLN
20.0000 mg | Freq: Once | INTRAMUSCULAR | Status: AC
  Administered 2011-08-06: 20 mg via INTRAVENOUS

## 2011-08-06 MED ORDER — SODIUM CHLORIDE 0.9 % IJ SOLN
10.0000 mL | INTRAMUSCULAR | Status: DC | PRN
  Administered 2011-08-06: 10 mL
  Filled 2011-08-06: qty 10

## 2011-08-06 MED ORDER — DOXORUBICIN HCL CHEMO IV INJECTION 2 MG/ML
25.0000 mg/m2 | Freq: Once | INTRAVENOUS | Status: AC
  Administered 2011-08-06: 56 mg via INTRAVENOUS
  Filled 2011-08-06: qty 28

## 2011-08-06 MED ORDER — VINBLASTINE SULFATE CHEMO INJECTION 1 MG/ML
6.0000 mg/m2 | Freq: Once | INTRAVENOUS | Status: AC
  Administered 2011-08-06: 14 mg via INTRAVENOUS
  Filled 2011-08-06: qty 14

## 2011-08-06 MED ORDER — SODIUM CHLORIDE 0.9 % IV SOLN
Freq: Once | INTRAVENOUS | Status: AC
  Administered 2011-08-06: 09:00:00 via INTRAVENOUS

## 2011-08-06 MED ORDER — HEPARIN SOD (PORK) LOCK FLUSH 100 UNIT/ML IV SOLN
500.0000 [IU] | Freq: Once | INTRAVENOUS | Status: AC | PRN
  Administered 2011-08-06: 500 [IU]
  Filled 2011-08-06: qty 5

## 2011-08-06 MED ORDER — ONDANSETRON 16 MG/50ML IVPB (CHCC)
16.0000 mg | Freq: Once | INTRAVENOUS | Status: AC
  Administered 2011-08-06: 16 mg via INTRAVENOUS

## 2011-08-06 NOTE — Patient Instructions (Signed)
[facility] Discharge Instructions for Patients Receiving Chemotherapy  Today you received the following chemotherapy agents; Decarbazine, Adriamycin, Bleocin and Vinblastine.   To help prevent nausea and vomiting after your treatment, we encourage you to take your nausea medication as instructed by your physician.  If you develop nausea and vomiting that is not controlled by your nausea medication, call the clinic. If it is after clinic hours your family physician or the after hours number for the clinic or go to the Emergency Department.   BELOW ARE SYMPTOMS THAT SHOULD BE REPORTED IMMEDIATELY:  *FEVER GREATER THAN 100.5 F  *CHILLS WITH OR WITHOUT FEVER  NAUSEA AND VOMITING THAT IS NOT CONTROLLED WITH YOUR NAUSEA MEDICATION  *UNUSUAL SHORTNESS OF BREATH  *UNUSUAL BRUISING OR BLEEDING  TENDERNESS IN MOUTH AND THROAT WITH OR WITHOUT PRESENCE OF ULCERS  *URINARY PROBLEMS  *BOWEL PROBLEMS  UNUSUAL RASH Items with * indicate a potential emergency and should be followed up as soon as possible.  One of the nurses will contact you 24 hours after your treatment. Please let the nurse know about any problems that you may have experienced. Feel free to call the clinic you have any questions or concerns. The clinic phone number is [phone].   I have been informed and understand all the instructions given to me. I know to contact the clinic, my physician, or go to the Emergency Department if any problems should occur. I do not have any questions at this time, but understand that I may call the clinic during office hours   should I have any questions or need assistance in obtaining follow up care.    __________________________________________  _____________  __________ Signature of Patient or Authorized Representative            Date                   Time    __________________________________________ Nurse's Signature

## 2011-08-06 NOTE — Patient Instructions (Addendum)
A.  Result of PET scan 08/05/2011.  Head/Neck: Resolved prior right posterior parotid nodal  hypermetabolic activity. Small slightly hypermetabolic cervical  lymph nodes, SUVmax up to 4.7 on the left (previously 4.9). Stable  thyroid goiter.  Chest: Marked reduction in bilateral axillary adenopathy noted.  Right anterior axillary node with short axis diameter of 1.1 cm  (formerly 2.1 cm) has a maximum standard uptake value of 1.5  (formerly 8.3). No pathologic mediastinal or hilar activity noted.  Lingular subsegmental atelectasis is present.  Abdomen/Pelvis: Peripancreatic nodes currently have a standard  uptake value of similar to background levels and/or previously  mildly hypermetabolic. Index left periaortic node short axis  diameter is 1.2 cm with a standard uptake value of 3.1 (formerly  5.8) reduced stranding noted in the left periureteral region, with  reduced size and activity in the external iliac nodes and resolved  inguinal hypermetabolic activity. Scrotum/testicles not completely  included on this exam.  Skeleton: The L3 vertebral body is currently sclerotic in the  vicinity of the prior tumor, with resolved hypermetabolic activity  (formerly SUVmax 9.1). No residual asymmetry in metabolic activity  in the right iliac bone.   IMPRESSION:   Marked improvement, with resolution of the osseous,  peripancreatic, retroperitoneal, inguinal, and right parotid  hypermetabolic activity, and marked reduction in the bilateral  axillary and cervical adenopathy. Corresponding reduction in nodal  size noted.   B.  Plan:  Proceed with chemo as planned for 6 cycles total (4 more to go)

## 2011-08-06 NOTE — Progress Notes (Signed)
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   CURRENT THERAPY: Started on 06/11/2011 with d1, d15, ABVD with Neulasta injection the day after. Plan is for minimal of 6 cycles total.   INTERVAL HISTORY: Devon Brady 61 y.o. male returns to clinic with his wife after restaging PET scan and to start cycle #3, day 1 of chemo.  He reported that a few days after Neulasta injection, he has mild ache in the lower back and bilateral hips.  However,the pain resolves spontaneously.  He no longer needs pain medication like when he was first diagnosed.  He had some purulent discharge from the right inguinal area where he had excisional biopsy.  This area has healed up the last few days without any more purulent discharge.  He denied fever, erythema, pain in the area.  He no longer feels any nodal swelling.   He still works full time without fatigue.  Patient denies fatigue, headache, visual changes, confusion, drenching night sweats, palpable lymph node swelling, mucositis, odynophagia, dysphagia, nausea vomiting, jaundice, chest pain, palpitation, shortness of breath, dyspnea on exertion, productive cough, gum bleeding, epistaxis, hematemesis, hemoptysis, abdominal pain, abdominal swelling, early satiety, melena, hematochezia, hematuria, skin rash, spontaneous bleeding, joint swelling, joint pain, heat or cold intolerance, bowel bladder incontinence, back pain, focal motor weakness, paresthesia, depression, suicidal or homocidal ideation, feeling hopelessness.   Past Medical History  Diagnosis Date  . Hypertension   . Hyperlipidemia   . CAD (coronary artery disease)     s/p 5V CABG 8/11  . RCA occlusion   . Multiple thyroid nodules   . Anemia   . Lymphocytosis   . Lymphadenopathy    . Hydronephrosis   . Renal insufficiency   . Complication of anesthesia     slow to wake up  . Dizziness     occasional for last week  . Hemorrhoid   . Sleep apnea     STOPBANG=5  . Hodgkin's lymphoma     Past Surgical History  Procedure Date  . Vasectomy 1980  . Bunionectomy 1978    right foot  . Cardiac catheterization 10/15/2009  . Coronary artery bypass graft 10/17/2009    LIMA to LAD,SVG to Ramus,SVG to OM sequential to OM2, SVG to marginal of RCA  . Tonsillectomy as child  . Portacath placement 06/10/2011    Procedure: INSERTION PORT-A-CATH;  Surgeon: Almond Lint, MD;  Location: [facility];  Service: General;  Laterality: Left;  subclavian     Current Outpatient Prescriptions  Medication Sig Dispense Refill  . aspirin 81 MG tablet Take 81 mg by mouth.       . cetirizine (ZYRTEC) 10 MG tablet Take 10 mg by mouth daily.      . fluticasone (FLONASE) 50 MCG/ACT nasal spray Place 2 sprays into the nose as needed. Allergies       . HYDROcodone-acetaminophen (NORCO) 5-325 MG per tablet 1 tablet Every 6 hours as needed.      . lidocaine-prilocaine (EMLA) cream APPLY TO PAC 1- 2 HOURS PRIOR TO ACCESS.  30 g  1  . LORazepam (ATIVAN) 0.5 MG tablet Take 1 tablet (0.5 mg total) by mouth every 6 (six) hours as needed (Nausea or vomiting).  30 tablet  0  . metoprolol succinate (TOPROL-XL)  50 MG 24 hr tablet       . metoprolol succinate (TOPROL-XL) 50 MG 24 hr tablet TAKE 1 TABLET EVERY DAY  30 tablet  2  . Multiple Vitamins-Iron (MULTIVITAMINS WITH IRON) TABS Take 1 tablet by mouth daily.      . Omega-3 Fatty Acids (FISH OIL) 1000 MG CAPS Take 1 capsule by mouth 3 (three) times daily with meals.       Marland Kitchen omeprazole (PRILOSEC) 40 MG capsule Take 40 mg by mouth every morning.       . ondansetron (ZOFRAN) 8 MG tablet Take 1 tab two times a day starting the day after chemo for 3 days. Then take 1 tab two times a day as needed for nausea or vomiting.  30 tablet  1  . prochlorperazine (COMPAZINE)  10 MG tablet Take 1 tablet (10 mg total) by mouth every 6 (six) hours as needed (Nausea or vomiting).  30 tablet  1  . rosuvastatin (CRESTOR) 20 MG tablet Take 20 mg by mouth every evening.      Marland Kitchen DISCONTD: citalopram (CELEXA) 20 MG tablet Take 1 tablet (20 mg total) by mouth daily.  30 tablet  1  . DISCONTD: omeprazole (PRILOSEC OTC) 20 MG tablet Take 1 tablet (20 mg total) by mouth daily.  28 tablet  1   No current facility-administered medications for this visit.   Facility-Administered Medications Ordered in Other Visits  Medication Dose Route Frequency Provider Last Rate Last Dose  . 0.9 %  sodium chloride infusion   Intravenous Once Exie Parody, MD      . bleomycin (BLEOCIN) 23 Units in sodium chloride 0.9 % 50 mL chemo infusion  10 Units/m2 (Treatment Plan Actual) Intravenous Once Exie Parody, MD   23 Units at 08/06/11 1032  . dacarbazine (DTIC) 840 mg in sodium chloride 0.9 % 250 mL chemo infusion  375 mg/m2 (Treatment Plan Actual) Intravenous Once Exie Parody, MD   840 mg at 08/06/11 1100  . dexamethasone (DECADRON) injection 20 mg  20 mg Intravenous Once Exie Parody, MD   20 mg at 08/06/11 0938  . DOXOrubicin (ADRIAMYCIN) chemo injection 56 mg  25 mg/m2 (Treatment Plan Actual) Intravenous Once Exie Parody, MD   56 mg at 08/06/11 1010  . heparin lock flush 100 unit/mL  500 Units Intracatheter Once PRN Exie Parody, MD   500 Units at 08/06/11 1215  . ondansetron (ZOFRAN) IVPB 16 mg  16 mg Intravenous Once Exie Parody, MD   16 mg at 08/06/11 0938  . sodium chloride 0.9 % injection 10 mL  10 mL Intracatheter PRN Exie Parody, MD   10 mL at 08/06/11 1215  . vinBLAStine (VELBAN) chemo injection 14 mg  6 mg/m2 (Treatment Plan Actual) Intravenous Once Exie Parody, MD   14 mg at 08/06/11 1026    ALLERGIES:   has no known allergies.  REVIEW OF SYSTEMS:  The rest of the 14-point review of system was negative.   Filed Vitals:   08/06/11 0842  BP: 121/78  Pulse: 85  Temp: 97.7 F (36.5 C)   Wt Readings from  Last 3 Encounters:  08/06/11 233 lb 8 oz (105.915 kg)  07/23/11 232 lb 14.4 oz (105.643 kg)  07/09/11 229 lb 8 oz (104.101 kg)   ECOG Performance status: 0  PHYSICAL EXAMINATION:  General:  well-nourished in no acute distress.  Eyes:  no scleral icterus.  ENT:  There were no oropharyngeal lesions.  Neck was without thyromegaly.  Lymphatics:  Negative cervical, supraclavicular, inguinal or axillary adenopathy.  There was a wound in the right inguinal area that has healed with granulation tissue without erythema, increased temperature, discharge, or opened wound.  Respiratory: lungs were clear bilaterally without wheezing or crackles.  Cardiovascular:  Regular rate and rhythm, S1/S2, without murmur, rub or gallop.  There was no pedal edema.  GI:  abdomen was soft, flat, nontender, nondistended, without organomegaly.  Muscoloskeletal:  no spinal tenderness of palpation of vertebral spine.  Skin exam was without echymosis, petichae.  Neuro exam was nonfocal.  Patient was able to get on and off exam table without assistance.  Gait was normal.  Patient was alerted and oriented.  Attention was good.   Language was appropriate.  Mood was normal without depression.  Speech was not pressured.  Thought content was not tangential.         LABORATORY/RADIOLOGY DATA:  Lab Results  Component Value Date   WBC 10.4* 08/06/2011   HGB 12.6* 08/06/2011   HCT 38.5 08/06/2011   PLT 237 08/06/2011   GLUCOSE 87 07/23/2011   CHOL 122 10/13/2010   TRIG 43.0 10/13/2010   HDL 29.10* 10/13/2010   LDLCALC 84 10/13/2010   ALKPHOS 91 07/23/2011   ALT 9 07/23/2011   AST 18 07/23/2011   NA 140 07/23/2011   K 4.5 07/23/2011   CL 107 07/23/2011   CREATININE 1.44* 07/23/2011   BUN 21 07/23/2011   CO2 24 07/23/2011   PSA 1.28 09/05/2009   INR 1.08 06/10/2011   HGBA1C 6.2 12/04/2009   IMAGING:  I personally reviewed the following PET scan and showed the patient and his wife the images.  In short, there was remarkable response to  chemo on this PET scan.      Nm Pet Image Restag (ps) Skull Base To Thigh  08/05/2011  *RADIOLOGY REPORT*  Clinical Data:  Subsequent treatment strategy for Hodgkin's lymphoma.  NUCLEAR MEDICINE PET WHOLE BODY  Fasting Blood Glucose:  105  Technique:  14.9 mCi F-18 FDG was injected intravenously. CT data was obtained and used for attenuation correction and anatomic localization only.  (This was not acquired as a diagnostic CT examination.) Additional exam technical data entered on technologist worksheet.  Comparison:  06/11/2011  Findings:  Head/Neck:  Resolved prior right posterior parotid nodal hypermetabolic activity.  Small slightly hypermetabolic cervical lymph nodes, SUVmax up to 4.7 on the left (previously 4.9).  Stable thyroid goiter.  Chest:  Marked reduction in bilateral axillary adenopathy noted. Right anterior axillary node with short axis diameter of 1.1 cm (formerly 2.1 cm) has a maximum standard uptake value of 1.5 (formerly 8.3).  No pathologic mediastinal or hilar activity noted. Lingular subsegmental atelectasis is present.  Abdomen/Pelvis:  Peripancreatic nodes currently have a standard uptake value of similar to background levels and/or previously mildly hypermetabolic.  Index left periaortic node short axis diameter is 1.2 cm with a standard uptake value of 3.1 (formerly 5.8) reduced stranding noted in the left periureteral region, with reduced size and activity in the external iliac nodes and resolved inguinal hypermetabolic activity.  Scrotum/testicles not completely included on this exam.  Skeleton:  The L3 vertebral body is currently sclerotic in the vicinity of the prior tumor, with resolved hypermetabolic activity (formerly SUVmax 9.1).  No residual asymmetry in metabolic activity in the right iliac bone.  IMPRESSION:  1.  Marked improvement, with resolution of the osseous, peripancreatic, retroperitoneal, inguinal, and right parotid hypermetabolic activity, and  marked reduction in  the bilateral axillary and cervical adenopathy.  Corresponding reduction in nodal size noted.  Original Report Authenticated By: Dellia Cloud, M.D.    ASSESSMENT AND PLAN:   1. Hodgkin's lymphoma, stage IV, high risk: He is s/p 2 cycles of ABVD with complete response per follow up PET scan.  He has mild arthralgia after Neulasta.  However, he does not have any dose-limiting toxicity.  His performance status now has improved compared to before chemo.  I recommended to the patient and his wife to continue with chemo without dose attenuation.  We are aiming for 6 cycles total.   2. Anemia:  Due to chemo. There is no active bleeding. This is grade 1. There is no indication for transfusion.   3. Right flank pain. Pain is improved and he has not required any pain medications since hospital discharge.   4. Thyromegaly with thyroid nodule. Thyroid ultrasound performed on 06/10/2011 showed bilateral thyroid lobe and isthmic solid hypervascular thyroid nodules. The thyroid nodules met criteria for thyroid biopsy but the PET scan did not show any activity. Plan to pursue a thyroid biopsy by interventional radiology once chemotherapy is complete if there is still a thyroid mass.   5. Hydronephrosis due to obstructing lymphoma:  Creatinine has improved now compared to prior to chemo.   6. Hypertension. Well controlled on metoprolol per PCP.   7. Hyperlipidemia. He is on his Crestor per PCP.   8. Follow up: In 2 weeks for cycle #3, day 15 of ABVD.      The length of time of the face-to-face encounter was 25 minutes. More than 50% of time was spent counseling and coordination of care.

## 2011-08-06 NOTE — Telephone Encounter (Signed)
Gv pt appts for [id number] °

## 2011-08-07 ENCOUNTER — Ambulatory Visit (HOSPITAL_BASED_OUTPATIENT_CLINIC_OR_DEPARTMENT_OTHER): Payer: BC Managed Care – PPO

## 2011-08-07 VITALS — BP 127/75 | HR 84 | Temp 97.9°F

## 2011-08-07 DIAGNOSIS — C8190 Hodgkin lymphoma, unspecified, unspecified site: Secondary | ICD-10-CM

## 2011-08-07 DIAGNOSIS — Z5189 Encounter for other specified aftercare: Secondary | ICD-10-CM

## 2011-08-07 MED ORDER — PEGFILGRASTIM INJECTION 6 MG/0.6ML
6.0000 mg | Freq: Once | SUBCUTANEOUS | Status: AC
  Administered 2011-08-07: 6 mg via SUBCUTANEOUS

## 2011-08-07 NOTE — Patient Instructions (Signed)
Pt in for injection.  Pt has no complaints.  Pt discharged home ambulatory with instructions to call with questions and concerns.

## 2011-08-10 ENCOUNTER — Other Ambulatory Visit (HOSPITAL_COMMUNITY): Payer: BC Managed Care – PPO

## 2011-08-20 ENCOUNTER — Ambulatory Visit (HOSPITAL_BASED_OUTPATIENT_CLINIC_OR_DEPARTMENT_OTHER): Payer: BC Managed Care – PPO | Admitting: Oncology

## 2011-08-20 ENCOUNTER — Ambulatory Visit: Payer: BC Managed Care – PPO

## 2011-08-20 ENCOUNTER — Other Ambulatory Visit (HOSPITAL_BASED_OUTPATIENT_CLINIC_OR_DEPARTMENT_OTHER): Payer: BC Managed Care – PPO | Admitting: Lab

## 2011-08-20 ENCOUNTER — Ambulatory Visit (HOSPITAL_BASED_OUTPATIENT_CLINIC_OR_DEPARTMENT_OTHER): Payer: BC Managed Care – PPO

## 2011-08-20 VITALS — BP 127/80 | HR 81 | Temp 97.0°F | Ht 70.0 in | Wt 240.0 lb

## 2011-08-20 DIAGNOSIS — D6481 Anemia due to antineoplastic chemotherapy: Secondary | ICD-10-CM

## 2011-08-20 DIAGNOSIS — E041 Nontoxic single thyroid nodule: Secondary | ICD-10-CM

## 2011-08-20 DIAGNOSIS — C8190 Hodgkin lymphoma, unspecified, unspecified site: Secondary | ICD-10-CM

## 2011-08-20 DIAGNOSIS — Z5111 Encounter for antineoplastic chemotherapy: Secondary | ICD-10-CM

## 2011-08-20 DIAGNOSIS — N1330 Unspecified hydronephrosis: Secondary | ICD-10-CM

## 2011-08-20 LAB — CBC WITH DIFFERENTIAL/PLATELET
Basophils Absolute: 0.1 10*3/uL (ref 0.0–0.1)
Eosinophils Absolute: 0.3 10*3/uL (ref 0.0–0.5)
HGB: 12.5 g/dL — ABNORMAL LOW (ref 13.0–17.1)
MONO#: 0.9 10*3/uL (ref 0.1–0.9)
NEUT#: 9.6 10*3/uL — ABNORMAL HIGH (ref 1.5–6.5)
Platelets: 228 10*3/uL (ref 140–400)
RBC: 4.76 10*6/uL (ref 4.20–5.82)
RDW: 22.5 % — ABNORMAL HIGH (ref 11.0–14.6)
WBC: 13.3 10*3/uL — ABNORMAL HIGH (ref 4.0–10.3)

## 2011-08-20 LAB — COMPREHENSIVE METABOLIC PANEL
Albumin: 3.7 g/dL (ref 3.5–5.2)
CO2: 25 mEq/L (ref 19–32)
Calcium: 9.1 mg/dL (ref 8.4–10.5)
Glucose, Bld: 125 mg/dL — ABNORMAL HIGH (ref 70–99)
Potassium: 3.9 mEq/L (ref 3.5–5.3)
Sodium: 139 mEq/L (ref 135–145)
Total Protein: 6.5 g/dL (ref 6.0–8.3)

## 2011-08-20 MED ORDER — DEXAMETHASONE SODIUM PHOSPHATE 4 MG/ML IJ SOLN
20.0000 mg | Freq: Once | INTRAMUSCULAR | Status: AC
  Administered 2011-08-20: 20 mg via INTRAVENOUS

## 2011-08-20 MED ORDER — DACARBAZINE 200 MG IV SOLR
375.0000 mg/m2 | Freq: Once | INTRAVENOUS | Status: AC
  Administered 2011-08-20: 840 mg via INTRAVENOUS
  Filled 2011-08-20: qty 42

## 2011-08-20 MED ORDER — ONDANSETRON 16 MG/50ML IVPB (CHCC)
16.0000 mg | Freq: Once | INTRAVENOUS | Status: AC
  Administered 2011-08-20: 16 mg via INTRAVENOUS

## 2011-08-20 MED ORDER — SODIUM CHLORIDE 0.9 % IV SOLN
Freq: Once | INTRAVENOUS | Status: AC
  Administered 2011-08-20: 16:00:00 via INTRAVENOUS

## 2011-08-20 MED ORDER — HEPARIN SOD (PORK) LOCK FLUSH 100 UNIT/ML IV SOLN
500.0000 [IU] | Freq: Once | INTRAVENOUS | Status: AC | PRN
  Administered 2011-08-20: 500 [IU]
  Filled 2011-08-20: qty 5

## 2011-08-20 MED ORDER — SODIUM CHLORIDE 0.9 % IJ SOLN
10.0000 mL | INTRAMUSCULAR | Status: DC | PRN
  Administered 2011-08-20: 10 mL
  Filled 2011-08-20: qty 10

## 2011-08-20 MED ORDER — SODIUM CHLORIDE 0.9 % IV SOLN
10.0000 [IU]/m2 | Freq: Once | INTRAVENOUS | Status: AC
  Administered 2011-08-20: 23 [IU] via INTRAVENOUS
  Filled 2011-08-20: qty 7.7

## 2011-08-20 MED ORDER — DOXORUBICIN HCL CHEMO IV INJECTION 2 MG/ML
25.0000 mg/m2 | Freq: Once | INTRAVENOUS | Status: AC
  Administered 2011-08-20: 56 mg via INTRAVENOUS
  Filled 2011-08-20: qty 28

## 2011-08-20 MED ORDER — VINBLASTINE SULFATE CHEMO INJECTION 1 MG/ML
6.0000 mg/m2 | Freq: Once | INTRAVENOUS | Status: AC
  Administered 2011-08-20: 14 mg via INTRAVENOUS
  Filled 2011-08-20: qty 14

## 2011-08-20 NOTE — Patient Instructions (Signed)
[facility] Discharge Instructions for Patients Receiving Chemotherapy  Today you received the following chemotherapy agents Doxorubicin, Vinblastine, Bleomycin and Dacarbazine  To help prevent nausea and vomiting after your treatment, we encourage you to take your nausea medication Begin taking it at 7 pm and take it as often as prescribed for the next 24 to 72 hours.   If you develop nausea and vomiting that is not controlled by your nausea medication, call the clinic. If it is after clinic hours your family physician or the after hours number for the clinic or go to the Emergency Department.   BELOW ARE SYMPTOMS THAT SHOULD BE REPORTED IMMEDIATELY:  *FEVER GREATER THAN 100.5 F  *CHILLS WITH OR WITHOUT FEVER  NAUSEA AND VOMITING THAT IS NOT CONTROLLED WITH YOUR NAUSEA MEDICATION  *UNUSUAL SHORTNESS OF BREATH  *UNUSUAL BRUISING OR BLEEDING  TENDERNESS IN MOUTH AND THROAT WITH OR WITHOUT PRESENCE OF ULCERS  *URINARY PROBLEMS  *BOWEL PROBLEMS  UNUSUAL RASH Items with * indicate a potential emergency and should be followed up as soon as possible.  One of the nurses will contact you 24 hours after your treatment. Please let the nurse know about any problems that you may have experienced. Feel free to call the clinic you have any questions or concerns. The clinic phone number is [phone].   I have been informed and understand all the instructions given to me. I know to contact the clinic, my physician, or go to the Emergency Department if any problems should occur. I do not have any questions at this time, but understand that I may call the clinic during office hours   should I have any questions or need assistance in obtaining follow up care.    __________________________________________  _____________  __________ Signature of Patient or Authorized Representative            Date                   Time    __________________________________________ Nurse's  Signature

## 2011-08-20 NOTE — Progress Notes (Signed)
[facility]  Telephone:(336) [phone] Fax:(336) [phone]   OFFICE PROGRESS NOTE   Cc:  Henri Medal, MD, MD   DIAGNOSIS: stage IV Hodgkin's lymphoma (involvement of bone); IPS of 5 (alb <4; Hgb <10.5; Male, age >58; WBC >15K) or high risk.   CURRENT THERAPY: Started on 06/11/2011 with d1, d15, ABVD with Neulasta injection the day after. Plan is for minimal of 6 cycles total.   INTERVAL HISTORY: Devon Brady 61 y.o. male returns for regular follow up with his wife for cycle#3, day#15.  He has been doing well.  He has been working full time without fatigue.  He has mild nausea but no vomiting for about 5 days after chemo.  He has not needed to take oral antiemetics.  He has good appetite and is regaining his lost weight.  He no longer has lower back pain; and has not taken any pain med.   Patient denies fatigue, headache, visual changes, confusion, drenching night sweats, palpable lymph node swelling, mucositis, odynophagia, dysphagia, nausea vomiting, jaundice, chest pain, palpitation, shortness of breath, dyspnea on exertion, productive cough, gum bleeding, epistaxis, hematemesis, hemoptysis, abdominal pain, abdominal swelling, early satiety, melena, hematochezia, hematuria, skin rash, spontaneous bleeding, joint swelling, joint pain, heat or cold intolerance, bowel bladder incontinence, back pain, focal motor weakness, paresthesia, depression, suicidal or homocidal ideation, feeling hopelessness.    Past Medical History  Diagnosis Date  . Hypertension   . Hyperlipidemia   . CAD (coronary artery disease)     s/p 5V CABG 8/11  . RCA occlusion   . Multiple thyroid nodules   . Anemia   . Lymphocytosis   . Lymphadenopathy   . Hydronephrosis   . Renal insufficiency   . Complication of anesthesia     slow to wake up  . Dizziness     occasional for last week  . Hemorrhoid   . Sleep apnea     STOPBANG=5  . Hodgkin's lymphoma     Past Surgical History    Procedure Date  . Vasectomy 1980  . Bunionectomy 1978    right foot  . Cardiac catheterization 10/15/2009  . Coronary artery bypass graft 10/17/2009    LIMA to LAD,SVG to Ramus,SVG to OM sequential to OM2, SVG to marginal of RCA  . Tonsillectomy as child  . Portacath placement 06/10/2011    Procedure: INSERTION PORT-A-CATH;  Surgeon: Almond Lint, MD;  Location: [facility];  Service: General;  Laterality: Left;  subclavian     Current Outpatient Prescriptions  Medication Sig Dispense Refill  . aspirin 81 MG tablet Take 81 mg by mouth.       . cetirizine (ZYRTEC) 10 MG tablet Take 10 mg by mouth daily.      . fluticasone (FLONASE) 50 MCG/ACT nasal spray Place 2 sprays into the nose as needed. Allergies       . lidocaine-prilocaine (EMLA) cream APPLY TO PAC 1- 2 HOURS PRIOR TO ACCESS.  30 g  1  . LORazepam (ATIVAN) 0.5 MG tablet Take 1 tablet (0.5 mg total) by mouth every 6 (six) hours as needed (Nausea or vomiting).  30 tablet  0  . metoprolol succinate (TOPROL-XL) 50 MG 24 hr tablet       . metoprolol succinate (TOPROL-XL) 50 MG 24 hr tablet TAKE 1 TABLET EVERY DAY  30 tablet  2  . Multiple Vitamins-Iron (MULTIVITAMINS WITH IRON) TABS Take 1 tablet by mouth daily.      . Omega-3 Fatty Acids (FISH OIL)  1000 MG CAPS Take 1 capsule by mouth 3 (three) times daily with meals.       Marland Kitchen omeprazole (PRILOSEC) 40 MG capsule Take 40 mg by mouth every morning.       . ondansetron (ZOFRAN) 8 MG tablet Take 1 tab two times a day starting the day after chemo for 3 days. Then take 1 tab two times a day as needed for nausea or vomiting.  30 tablet  1  . prochlorperazine (COMPAZINE) 10 MG tablet Take 1 tablet (10 mg total) by mouth every 6 (six) hours as needed (Nausea or vomiting).  30 tablet  1  . rosuvastatin (CRESTOR) 20 MG tablet Take 20 mg by mouth every evening.      Marland Kitchen HYDROcodone-acetaminophen (NORCO) 5-325 MG per tablet 1 tablet Every 6 hours as needed.      Marland Kitchen DISCONTD: citalopram (CELEXA) 20 MG tablet  Take 1 tablet (20 mg total) by mouth daily.  30 tablet  1  . DISCONTD: omeprazole (PRILOSEC OTC) 20 MG tablet Take 1 tablet (20 mg total) by mouth daily.  28 tablet  1    ALLERGIES:   has no known allergies.  REVIEW OF SYSTEMS:  The rest of the 14-point review of system was negative.   Filed Vitals:   08/20/11 1336  BP: 127/80  Pulse: 81  Temp: 97 F (36.1 C)   Wt Readings from Last 3 Encounters:  08/20/11 240 lb (108.863 kg)  08/06/11 233 lb 8 oz (105.915 kg)  07/23/11 232 lb 14.4 oz (105.643 kg)   ECOG Performance status: 0  PHYSICAL EXAMINATION:   General:  well-nourished man, in no acute distress.  Eyes:  no scleral icterus.  ENT:  There were no oropharyngeal lesions.  Neck was without thyromegaly.  Lymphatics:  Negative cervical, supraclavicular or axillary adenopathy.  Respiratory: lungs were clear bilaterally without wheezing or crackles.  Cardiovascular:  Regular rate and rhythm, S1/S2, without murmur, rub or gallop.  There was no pedal edema.  GI:  abdomen was soft, flat, nontender, nondistended, without organomegaly.  Muscoloskeletal:  no spinal tenderness of palpation of vertebral spine.  Skin exam was without echymosis, petichae.  Neuro exam was nonfocal.  Patient was able to get on and off exam table without assistance.  Gait was normal.  Patient was alerted and oriented.  Attention was good.   Language was appropriate.  Mood was normal without depression.  Speech was not pressured.  Thought content was not tangential.    LABORATORY/RADIOLOGY DATA:  Lab Results  Component Value Date   WBC 13.3* 08/20/2011   HGB 12.5* 08/20/2011   HCT 37.9* 08/20/2011   PLT 228 08/20/2011   GLUCOSE 92 08/06/2011   CHOL 122 10/13/2010   TRIG 43.0 10/13/2010   HDL 29.10* 10/13/2010   LDLCALC 84 10/13/2010   ALKPHOS 93 08/06/2011   ALT 9 08/06/2011   AST 17 08/06/2011   NA 142 08/06/2011   K 4.5 08/06/2011   CL 108 08/06/2011   CREATININE 1.51* 08/06/2011   BUN 24* 08/06/2011   CO2 27 08/06/2011    PSA 1.28 09/05/2009   INR 1.08 06/10/2011   HGBA1C 6.2 12/04/2009    ASSESSMENT AND PLAN:   1. Hodgkin's lymphoma, stage IV, high risk: He is s/p cycle#2 of chemo ABVD with clinical response. He had grade 1 anemia, grade 1 nausea.  He does not have dose limiting toxicity.  I advised him to proceed with cycle#3 day #15 of chemo today and return tomorrow  for Neulasta tomorrow to decrease the risk of neutropenic fever. After the first cycle, he had grade 3 neutropenia. I want to avoid neutropenic fever and delay of chemo by continuing Neulasta.  His next restaging PET scan will be after the 6th cycle.   2. Anemia: Due to chemo. There is no active bleeding. This is grade 1. There is no indication for transfusion.   3. Right flank pain. Pain is improved and he has not required any pain medications since hospital discharge.   4. Thyromegaly with thyroid nodule. Thyroid ultrasound performed on 06/10/2011 showed bilateral thyroid lobe and isthmic solid hypervascular thyroid nodules. The thyroid nodules met criteria for thyroid biopsy but the PET scan did not show any activity. Plan to pursue a thyroid biopsy by interventional radiology once chemotherapy is complete.   5. Hydronephrosis. His new baseline Cr is now around 1.5.   6. Hypertension. Well controlled on metoprolol per PCP.   7. Hyperlipidemia. He'll continue on his Crestor per PCP.   8. Follow up: In 2 weeks for cycle #4, day 1 of ABVD.

## 2011-08-21 ENCOUNTER — Ambulatory Visit (HOSPITAL_BASED_OUTPATIENT_CLINIC_OR_DEPARTMENT_OTHER): Payer: BC Managed Care – PPO

## 2011-08-21 VITALS — BP 136/83 | HR 85 | Temp 98.2°F

## 2011-08-21 DIAGNOSIS — C8190 Hodgkin lymphoma, unspecified, unspecified site: Secondary | ICD-10-CM

## 2011-08-21 DIAGNOSIS — Z5189 Encounter for other specified aftercare: Secondary | ICD-10-CM

## 2011-08-21 MED ORDER — PEGFILGRASTIM INJECTION 6 MG/0.6ML
6.0000 mg | Freq: Once | SUBCUTANEOUS | Status: AC
Start: 2011-08-21 — End: 2011-08-21
  Administered 2011-08-21: 6 mg via SUBCUTANEOUS

## 2011-09-03 ENCOUNTER — Ambulatory Visit (HOSPITAL_BASED_OUTPATIENT_CLINIC_OR_DEPARTMENT_OTHER): Payer: BC Managed Care – PPO | Admitting: Oncology

## 2011-09-03 ENCOUNTER — Ambulatory Visit (HOSPITAL_BASED_OUTPATIENT_CLINIC_OR_DEPARTMENT_OTHER): Payer: BC Managed Care – PPO

## 2011-09-03 ENCOUNTER — Ambulatory Visit: Payer: BC Managed Care – PPO

## 2011-09-03 ENCOUNTER — Telehealth: Payer: Self-pay | Admitting: Oncology

## 2011-09-03 ENCOUNTER — Other Ambulatory Visit (HOSPITAL_BASED_OUTPATIENT_CLINIC_OR_DEPARTMENT_OTHER): Payer: BC Managed Care – PPO | Admitting: Lab

## 2011-09-03 ENCOUNTER — Encounter: Payer: Self-pay | Admitting: Oncology

## 2011-09-03 VITALS — BP 120/83 | HR 77 | Temp 97.4°F | Ht 70.0 in | Wt 240.0 lb

## 2011-09-03 DIAGNOSIS — N1330 Unspecified hydronephrosis: Secondary | ICD-10-CM

## 2011-09-03 DIAGNOSIS — T451X5A Adverse effect of antineoplastic and immunosuppressive drugs, initial encounter: Secondary | ICD-10-CM

## 2011-09-03 DIAGNOSIS — C8190 Hodgkin lymphoma, unspecified, unspecified site: Secondary | ICD-10-CM

## 2011-09-03 DIAGNOSIS — D6481 Anemia due to antineoplastic chemotherapy: Secondary | ICD-10-CM

## 2011-09-03 DIAGNOSIS — E049 Nontoxic goiter, unspecified: Secondary | ICD-10-CM

## 2011-09-03 DIAGNOSIS — Z5111 Encounter for antineoplastic chemotherapy: Secondary | ICD-10-CM

## 2011-09-03 LAB — CBC WITH DIFFERENTIAL/PLATELET
BASO%: 0.6 % (ref 0.0–2.0)
Basophils Absolute: 0.1 10*3/uL (ref 0.0–0.1)
EOS%: 2 % (ref 0.0–7.0)
Eosinophils Absolute: 0.2 10*3/uL (ref 0.0–0.5)
HCT: 39.7 % (ref 38.4–49.9)
HGB: 13.1 g/dL (ref 13.0–17.1)
LYMPH%: 18 % (ref 14.0–49.0)
MCH: 27 pg — ABNORMAL LOW (ref 27.2–33.4)
MCHC: 33 g/dL (ref 32.0–36.0)
MCV: 81.7 fL (ref 79.3–98.0)
MONO#: 0.8 10*3/uL (ref 0.1–0.9)
MONO%: 7.7 % (ref 0.0–14.0)
NEUT#: 7.7 10*3/uL — ABNORMAL HIGH (ref 1.5–6.5)
NEUT%: 71.7 % (ref 39.0–75.0)
Platelets: 220 10*3/uL (ref 140–400)
RBC: 4.86 10*6/uL (ref 4.20–5.82)
RDW: 21.9 % — ABNORMAL HIGH (ref 11.0–14.6)
WBC: 10.7 10*3/uL — ABNORMAL HIGH (ref 4.0–10.3)
lymph#: 1.9 10*3/uL (ref 0.9–3.3)
nRBC: 0 % (ref 0–0)

## 2011-09-03 LAB — COMPREHENSIVE METABOLIC PANEL
ALT: 9 U/L (ref 0–53)
CO2: 23 mEq/L (ref 19–32)
Sodium: 140 mEq/L (ref 135–145)
Total Bilirubin: 0.3 mg/dL (ref 0.3–1.2)
Total Protein: 6.1 g/dL (ref 6.0–8.3)

## 2011-09-03 MED ORDER — VINBLASTINE SULFATE CHEMO INJECTION 1 MG/ML
6.0000 mg/m2 | Freq: Once | INTRAVENOUS | Status: AC
  Administered 2011-09-03: 14 mg via INTRAVENOUS
  Filled 2011-09-03: qty 14

## 2011-09-03 MED ORDER — SODIUM CHLORIDE 0.9 % IV SOLN
10.0000 [IU]/m2 | Freq: Once | INTRAVENOUS | Status: AC
  Administered 2011-09-03: 23 [IU] via INTRAVENOUS
  Filled 2011-09-03: qty 7.7

## 2011-09-03 MED ORDER — SODIUM CHLORIDE 0.9 % IJ SOLN
10.0000 mL | INTRAMUSCULAR | Status: DC | PRN
  Administered 2011-09-03: 10 mL
  Filled 2011-09-03: qty 10

## 2011-09-03 MED ORDER — SODIUM CHLORIDE 0.9 % IV SOLN
375.0000 mg/m2 | Freq: Once | INTRAVENOUS | Status: AC
  Administered 2011-09-03: 840 mg via INTRAVENOUS
  Filled 2011-09-03: qty 42

## 2011-09-03 MED ORDER — DEXAMETHASONE SODIUM PHOSPHATE 4 MG/ML IJ SOLN
20.0000 mg | Freq: Once | INTRAMUSCULAR | Status: AC
  Administered 2011-09-03: 20 mg via INTRAVENOUS

## 2011-09-03 MED ORDER — ONDANSETRON HCL 8 MG PO TABS: ORAL_TABLET | ORAL | Status: DC

## 2011-09-03 MED ORDER — ONDANSETRON 16 MG/50ML IVPB (CHCC)
16.0000 mg | Freq: Once | INTRAVENOUS | Status: AC
  Administered 2011-09-03: 16 mg via INTRAVENOUS

## 2011-09-03 MED ORDER — DOXORUBICIN HCL CHEMO IV INJECTION 2 MG/ML
25.0000 mg/m2 | Freq: Once | INTRAVENOUS | Status: AC
  Administered 2011-09-03: 56 mg via INTRAVENOUS
  Filled 2011-09-03: qty 28

## 2011-09-03 MED ORDER — HEPARIN SOD (PORK) LOCK FLUSH 100 UNIT/ML IV SOLN
500.0000 [IU] | Freq: Once | INTRAVENOUS | Status: AC | PRN
  Administered 2011-09-03: 500 [IU]
  Filled 2011-09-03: qty 5

## 2011-09-03 MED ORDER — SODIUM CHLORIDE 0.9 % IV SOLN
Freq: Once | INTRAVENOUS | Status: AC
  Administered 2011-09-03: 10:00:00 via INTRAVENOUS

## 2011-09-03 NOTE — Telephone Encounter (Signed)
Gave pt appt for June, July and August 2013 lab, chemo inj and MD, emailed [place] regarding chemo

## 2011-09-03 NOTE — Progress Notes (Signed)
[facility]  Telephone:(336) [phone] Fax:(336) [phone]   OFFICE PROGRESS NOTE   Cc:  Henri Medal, MD   DIAGNOSIS: stage IV Hodgkin's lymphoma (involvement of bone); IPS of 5 (alb <4; Hgb <10.5; Male, age >3; WBC >15K) or high risk.   CURRENT THERAPY: Started on 06/11/2011 with d1, d15, ABVD with Neulasta injection the day after. Plan is for minimal of 6 cycles total.   INTERVAL HISTORY: Devon Brady 61 y.o. male returns for regular follow up with his wife for cycle#4, day#1.  He has been doing well.  He has been working full time without fatigue.  He has mild nausea but no vomiting for about 5 days after chemo.  He has not needed to take oral antiemetics.  He has good appetite and is regaining his lost weight.  He no longer has lower back pain; and has not taken any pain med.   Patient denies fatigue, headache, visual changes, confusion, drenching night sweats, palpable lymph node swelling, mucositis, odynophagia, dysphagia, nausea vomiting, jaundice, chest pain, palpitation, shortness of breath, dyspnea on exertion, productive cough, gum bleeding, epistaxis, hematemesis, hemoptysis, abdominal pain, abdominal swelling, early satiety, melena, hematochezia, hematuria, skin rash, spontaneous bleeding, joint swelling, joint pain, heat or cold intolerance, bowel bladder incontinence, back pain, focal motor weakness, paresthesia, depression, suicidal or homocidal ideation, feeling hopelessness.  Past Medical History  Diagnosis Date  . Hypertension   . Hyperlipidemia   . CAD (coronary artery disease)     s/p 5V CABG 8/11  . RCA occlusion   . Multiple thyroid nodules   . Anemia   . Lymphocytosis   . Lymphadenopathy   . Hydronephrosis   . Renal insufficiency   . Complication of anesthesia     slow to wake up  . Dizziness     occasional for last week  . Hemorrhoid   . Sleep apnea     STOPBANG=5  . Hodgkin's lymphoma     Past Surgical History    Procedure Date  . Vasectomy 1980  . Bunionectomy 1978    right foot  . Cardiac catheterization 10/15/2009  . Coronary artery bypass graft 10/17/2009    LIMA to LAD,SVG to Ramus,SVG to OM sequential to OM2, SVG to marginal of RCA  . Tonsillectomy as child  . Portacath placement 06/10/2011    Procedure: INSERTION PORT-A-CATH;  Surgeon: Almond Lint, MD;  Location: [facility];  Service: General;  Laterality: Left;  subclavian     Current Outpatient Prescriptions  Medication Sig Dispense Refill  . aspirin 81 MG tablet Take 81 mg by mouth.       . cetirizine (ZYRTEC) 10 MG tablet Take 10 mg by mouth daily.      . fluticasone (FLONASE) 50 MCG/ACT nasal spray Place 2 sprays into the nose as needed. Allergies       . HYDROcodone-acetaminophen (NORCO) 5-325 MG per tablet 1 tablet Every 6 hours as needed.      . lidocaine-prilocaine (EMLA) cream APPLY TO PAC 1- 2 HOURS PRIOR TO ACCESS.  30 g  1  . LORazepam (ATIVAN) 0.5 MG tablet Take 1 tablet (0.5 mg total) by mouth every 6 (six) hours as needed (Nausea or vomiting).  30 tablet  0  . metoprolol succinate (TOPROL-XL) 50 MG 24 hr tablet       . metoprolol succinate (TOPROL-XL) 50 MG 24 hr tablet TAKE 1 TABLET EVERY DAY  30 tablet  2  . Multiple Vitamins-Iron (MULTIVITAMINS WITH IRON) TABS Take  1 tablet by mouth daily.      . Omega-3 Fatty Acids (FISH OIL) 1000 MG CAPS Take 1 capsule by mouth 3 (three) times daily with meals.       Marland Kitchen omeprazole (PRILOSEC) 40 MG capsule Take 40 mg by mouth every morning.       . ondansetron (ZOFRAN) 8 MG tablet Take 1 tab two times a day starting the day after chemo for 3 days. Then take 1 tab two times a day as needed for nausea or vomiting.  30 tablet  1  . prochlorperazine (COMPAZINE) 10 MG tablet Take 1 tablet (10 mg total) by mouth every 6 (six) hours as needed (Nausea or vomiting).  30 tablet  1  . rosuvastatin (CRESTOR) 20 MG tablet Take 20 mg by mouth every evening.      Marland Kitchen DISCONTD: citalopram (CELEXA) 20 MG tablet  Take 1 tablet (20 mg total) by mouth daily.  30 tablet  1  . DISCONTD: omeprazole (PRILOSEC OTC) 20 MG tablet Take 1 tablet (20 mg total) by mouth daily.  28 tablet  1   No current facility-administered medications for this visit.   Facility-Administered Medications Ordered in Other Visits  Medication Dose Route Frequency Provider Last Rate Last Dose  . 0.9 %  sodium chloride infusion   Intravenous Once Exie Parody, MD      . bleomycin (BLEOCIN) 23 Units in sodium chloride 0.9 % 50 mL chemo infusion  10 Units/m2 (Treatment Plan Actual) Intravenous Once Exie Parody, MD   23 Units at 09/03/11 1109  . dacarbazine (DTIC) 840 mg in sodium chloride 0.9 % 250 mL chemo infusion  375 mg/m2 (Treatment Plan Actual) Intravenous Once Exie Parody, MD   840 mg at 09/03/11 1128  . dexamethasone (DECADRON) injection 20 mg  20 mg Intravenous Once Exie Parody, MD   20 mg at 09/03/11 1004  . DOXOrubicin (ADRIAMYCIN) chemo injection 56 mg  25 mg/m2 (Treatment Plan Actual) Intravenous Once Exie Parody, MD   56 mg at 09/03/11 1049  . heparin lock flush 100 unit/mL  500 Units Intracatheter Once PRN Exie Parody, MD   500 Units at 09/03/11 1233  . ondansetron (ZOFRAN) IVPB 16 mg  16 mg Intravenous Once Exie Parody, MD   16 mg at 09/03/11 1004  . sodium chloride 0.9 % injection 10 mL  10 mL Intracatheter PRN Exie Parody, MD   10 mL at 09/03/11 1233  . vinBLAStine (VELBAN) chemo injection 14 mg  6 mg/m2 (Treatment Plan Actual) Intravenous Once Exie Parody, MD   14 mg at 09/03/11 1103    ALLERGIES:   has no known allergies.  REVIEW OF SYSTEMS:  The rest of the 14-point review of system was negative.   Filed Vitals:   09/03/11 0918  BP: 120/83  Pulse: 77  Temp: 97.4 F (36.3 C)   Wt Readings from Last 3 Encounters:  09/03/11 240 lb (108.863 kg)  08/20/11 240 lb (108.863 kg)  08/06/11 233 lb 8 oz (105.915 kg)   ECOG Performance status: 0  PHYSICAL EXAMINATION:   General:  well-nourished man, in no acute distress.  Eyes:  no  scleral icterus.  ENT:  There were no oropharyngeal lesions.  Neck was without thyromegaly.  Lymphatics:  Negative cervical, supraclavicular or axillary adenopathy.  Respiratory: lungs were clear bilaterally without wheezing or crackles.  Cardiovascular:  Regular rate and rhythm, S1/S2, without murmur, rub or gallop.  There was no  pedal edema.  GI:  abdomen was soft, flat, nontender, nondistended, without organomegaly.  Muscoloskeletal:  no spinal tenderness of palpation of vertebral spine.  Skin exam was without echymosis, petichae.  Neuro exam was nonfocal.  Patient was able to get on and off exam table without assistance.  Gait was normal.  Patient was alerted and oriented.  Attention was good.   Language was appropriate.  Mood was normal without depression.  Speech was not pressured.  Thought content was not tangential.    LABORATORY/RADIOLOGY DATA:  Lab Results  Component Value Date   WBC 10.7* 09/03/2011   HGB 13.1 09/03/2011   HCT 39.7 09/03/2011   PLT 220 09/03/2011   GLUCOSE 125* 08/20/2011   CHOL 122 10/13/2010   TRIG 43.0 10/13/2010   HDL 29.10* 10/13/2010   LDLCALC 84 10/13/2010   ALKPHOS 113 08/20/2011   ALT 9 08/20/2011   AST 18 08/20/2011   NA 139 08/20/2011   K 3.9 08/20/2011   CL 105 08/20/2011   CREATININE 1.43* 08/20/2011   BUN 17 08/20/2011   CO2 25 08/20/2011   PSA 1.28 09/05/2009   INR 1.08 06/10/2011   HGBA1C 6.2 12/04/2009    ASSESSMENT AND PLAN:   1. Hodgkin's lymphoma, stage IV, high risk: He is s/p 3 cycles of chemo ABVD with clinical response. He had grade 1 anemia, grade 1 nausea.  He does not have dose limiting toxicity.  I advised him to proceed with cycle#4 day #1 of chemo today and return tomorrow for Neulasta tomorrow to decrease the risk of neutropenic fever. After the first cycle, he had grade 3 neutropenia. I want to avoid neutropenic fever and delay of chemo by continuing Neulasta.  His next restaging PET scan will be after the 6th cycle.   2. Anemia: Due to chemo. There is no  active bleeding. This is grade 1. There is no indication for transfusion.   3. Right flank pain. Pain is improved and he has not required any pain medications since hospital discharge.   4. Thyromegaly with thyroid nodule. Thyroid ultrasound performed on 06/10/2011 showed bilateral thyroid lobe and isthmic solid hypervascular thyroid nodules. The thyroid nodules met criteria for thyroid biopsy but the PET scan did not show any activity. Plan to pursue a thyroid biopsy by interventional radiology once chemotherapy is complete.   5. Hydronephrosis. His new baseline Cr is now around 1.5.   6. Hypertension. Well controlled on metoprolol per PCP.   7. Hyperlipidemia. He'll continue on his Crestor per PCP.   8. Follow up: In 2 weeks for cycle #4, day 15 of ABVD.

## 2011-09-03 NOTE — Patient Instructions (Signed)
[facility] Discharge Instructions for Patients Receiving Chemotherapy  Today you received the following chemotherapy agents Adriamycin, Vinblastine, Bleomycin, Dacarbazine To help prevent nausea and vomiting after your treatment, we encourage you to take your nausea medication as prescribed.  If you develop nausea and vomiting that is not controlled by your nausea medication, call the clinic. If it is after clinic hours your family physician or the after hours number for the clinic or go to the Emergency Department.   BELOW ARE SYMPTOMS THAT SHOULD BE REPORTED IMMEDIATELY:  *FEVER GREATER THAN 100.5 F  *CHILLS WITH OR WITHOUT FEVER  NAUSEA AND VOMITING THAT IS NOT CONTROLLED WITH YOUR NAUSEA MEDICATION  *UNUSUAL SHORTNESS OF BREATH  *UNUSUAL BRUISING OR BLEEDING  TENDERNESS IN MOUTH AND THROAT WITH OR WITHOUT PRESENCE OF ULCERS  *URINARY PROBLEMS  *BOWEL PROBLEMS  UNUSUAL RASH Items with * indicate a potential emergency and should be followed up as soon as possible.  One of the nurses will contact you 24 hours after your treatment. Please let the nurse know about any problems that you may have experienced. Feel free to call the clinic you have any questions or concerns. The clinic phone number is [phone].   I have been informed and understand all the instructions given to me. I know to contact the clinic, my physician, or go to the Emergency Department if any problems should occur. I do not have any questions at this time, but understand that I may call the clinic during office hours   should I have any questions or need assistance in obtaining follow up care.    __________________________________________  _____________  __________ Signature of Patient or Authorized Representative            Date                   Time    __________________________________________ Nurse's Signature

## 2011-09-04 ENCOUNTER — Ambulatory Visit (HOSPITAL_BASED_OUTPATIENT_CLINIC_OR_DEPARTMENT_OTHER): Payer: BC Managed Care – PPO

## 2011-09-04 VITALS — BP 134/83 | HR 88 | Temp 97.6°F

## 2011-09-04 DIAGNOSIS — C8190 Hodgkin lymphoma, unspecified, unspecified site: Secondary | ICD-10-CM

## 2011-09-04 DIAGNOSIS — Z5189 Encounter for other specified aftercare: Secondary | ICD-10-CM

## 2011-09-04 MED ORDER — PEGFILGRASTIM INJECTION 6 MG/0.6ML
6.0000 mg | Freq: Once | SUBCUTANEOUS | Status: AC
  Administered 2011-09-04: 6 mg via SUBCUTANEOUS

## 2011-09-06 ENCOUNTER — Telehealth: Payer: Self-pay | Admitting: Oncology

## 2011-09-06 NOTE — Telephone Encounter (Signed)
Talked to pt and gave him appt date, he is aware of all appt in July 2013

## 2011-09-14 ENCOUNTER — Other Ambulatory Visit: Payer: Self-pay | Admitting: Family Medicine

## 2011-09-14 MED ORDER — METOPROLOL SUCCINATE ER 50 MG PO TB24: 50.0000 mg | ORAL_TABLET | Freq: Every day | ORAL | Status: DC

## 2011-09-14 NOTE — Telephone Encounter (Signed)
Faxed.   KP 

## 2011-09-17 ENCOUNTER — Ambulatory Visit (HOSPITAL_BASED_OUTPATIENT_CLINIC_OR_DEPARTMENT_OTHER): Payer: BC Managed Care – PPO

## 2011-09-17 ENCOUNTER — Other Ambulatory Visit (HOSPITAL_BASED_OUTPATIENT_CLINIC_OR_DEPARTMENT_OTHER): Payer: BC Managed Care – PPO

## 2011-09-17 ENCOUNTER — Ambulatory Visit (HOSPITAL_BASED_OUTPATIENT_CLINIC_OR_DEPARTMENT_OTHER): Payer: BC Managed Care – PPO | Admitting: Oncology

## 2011-09-17 VITALS — BP 129/83 | HR 84 | Temp 97.3°F | Ht 70.0 in | Wt 240.8 lb

## 2011-09-17 DIAGNOSIS — D6481 Anemia due to antineoplastic chemotherapy: Secondary | ICD-10-CM

## 2011-09-17 DIAGNOSIS — C8190 Hodgkin lymphoma, unspecified, unspecified site: Secondary | ICD-10-CM

## 2011-09-17 DIAGNOSIS — Z5111 Encounter for antineoplastic chemotherapy: Secondary | ICD-10-CM

## 2011-09-17 DIAGNOSIS — Z452 Encounter for adjustment and management of vascular access device: Secondary | ICD-10-CM

## 2011-09-17 LAB — CBC WITH DIFFERENTIAL/PLATELET
BASO%: 0.5 % (ref 0.0–2.0)
LYMPH%: 19.4 % (ref 14.0–49.0)
MCHC: 32.8 g/dL (ref 32.0–36.0)
MONO#: 0.8 10*3/uL (ref 0.1–0.9)
RBC: 5.07 10*6/uL (ref 4.20–5.82)
RDW: 20.2 % — ABNORMAL HIGH (ref 11.0–14.6)
WBC: 10.5 10*3/uL — ABNORMAL HIGH (ref 4.0–10.3)
lymph#: 2 10*3/uL (ref 0.9–3.3)
nRBC: 0 % (ref 0–0)

## 2011-09-17 LAB — COMPREHENSIVE METABOLIC PANEL
ALT: 9 U/L (ref 0–53)
AST: 17 U/L (ref 0–37)
Calcium: 9.3 mg/dL (ref 8.4–10.5)
Chloride: 108 mEq/L (ref 96–112)
Creatinine, Ser: 1.5 mg/dL — ABNORMAL HIGH (ref 0.50–1.35)
Potassium: 4.7 mEq/L (ref 3.5–5.3)

## 2011-09-17 MED ORDER — SODIUM CHLORIDE 0.9 % IV SOLN
Freq: Once | INTRAVENOUS | Status: AC
  Administered 2011-09-17: 11:00:00 via INTRAVENOUS

## 2011-09-17 MED ORDER — ALTEPLASE 2 MG IJ SOLR
2.0000 mg | Freq: Once | INTRAMUSCULAR | Status: AC | PRN
  Administered 2011-09-17: 2 mg
  Filled 2011-09-17: qty 2

## 2011-09-17 MED ORDER — HEPARIN SOD (PORK) LOCK FLUSH 100 UNIT/ML IV SOLN
500.0000 [IU] | Freq: Once | INTRAVENOUS | Status: AC | PRN
  Administered 2011-09-17: 500 [IU]
  Filled 2011-09-17: qty 5

## 2011-09-17 MED ORDER — ONDANSETRON 16 MG/50ML IVPB (CHCC)
16.0000 mg | Freq: Once | INTRAVENOUS | Status: AC
  Administered 2011-09-17: 16 mg via INTRAVENOUS

## 2011-09-17 MED ORDER — SODIUM CHLORIDE 0.9 % IV SOLN
10.0000 [IU]/m2 | Freq: Once | INTRAVENOUS | Status: AC
  Administered 2011-09-17: 23 [IU] via INTRAVENOUS
  Filled 2011-09-17: qty 7.7

## 2011-09-17 MED ORDER — VINBLASTINE SULFATE CHEMO INJECTION 1 MG/ML
6.0000 mg/m2 | Freq: Once | INTRAVENOUS | Status: AC
  Administered 2011-09-17: 14 mg via INTRAVENOUS
  Filled 2011-09-17: qty 14

## 2011-09-17 MED ORDER — SODIUM CHLORIDE 0.9 % IV SOLN
375.0000 mg/m2 | Freq: Once | INTRAVENOUS | Status: AC
  Administered 2011-09-17: 840 mg via INTRAVENOUS
  Filled 2011-09-17: qty 42

## 2011-09-17 MED ORDER — DEXAMETHASONE SODIUM PHOSPHATE 4 MG/ML IJ SOLN
20.0000 mg | Freq: Once | INTRAMUSCULAR | Status: AC
  Administered 2011-09-17: 20 mg via INTRAVENOUS

## 2011-09-17 MED ORDER — DOXORUBICIN HCL CHEMO IV INJECTION 2 MG/ML
25.0000 mg/m2 | Freq: Once | INTRAVENOUS | Status: AC
  Administered 2011-09-17: 56 mg via INTRAVENOUS
  Filled 2011-09-17: qty 28

## 2011-09-17 MED ORDER — SODIUM CHLORIDE 0.9 % IJ SOLN
10.0000 mL | INTRAMUSCULAR | Status: DC | PRN
  Administered 2011-09-17: 10 mL
  Filled 2011-09-17: qty 10

## 2011-09-17 NOTE — Patient Instructions (Signed)
[facility] Discharge Instructions for Patients Receiving Chemotherapy  Today you received the following chemotherapy agents Adriamycin, Bleocin, Velban and Dacarbazine.  To help prevent nausea and vomiting after your treatment, we encourage you to take your nausea medication.   If you develop nausea and vomiting that is not controlled by your nausea medication, call the clinic. If it is after clinic hours your family physician or the after hours number for the clinic or go to the Emergency Department.   BELOW ARE SYMPTOMS THAT SHOULD BE REPORTED IMMEDIATELY:  *FEVER GREATER THAN 100.5 F  *CHILLS WITH OR WITHOUT FEVER  NAUSEA AND VOMITING THAT IS NOT CONTROLLED WITH YOUR NAUSEA MEDICATION  *UNUSUAL SHORTNESS OF BREATH  *UNUSUAL BRUISING OR BLEEDING  TENDERNESS IN MOUTH AND THROAT WITH OR WITHOUT PRESENCE OF ULCERS  *URINARY PROBLEMS  *BOWEL PROBLEMS  UNUSUAL RASH Items with * indicate a potential emergency and should be followed up as soon as possible.  One of the nurses will contact you 24 hours after your treatment. Please let the nurse know about any problems that you may have experienced. Feel free to call the clinic you have any questions or concerns. The clinic phone number is [phone].   I have been informed and understand all the instructions given to me. I know to contact the clinic, my physician, or go to the Emergency Department if any problems should occur. I do not have any questions at this time, but understand that I may call the clinic during office hours   should I have any questions or need assistance in obtaining follow up care.    __________________________________________  _____________  __________ Signature of Patient or Authorized Representative            Date                   Time    __________________________________________ Nurse's Signature

## 2011-09-17 NOTE — Progress Notes (Signed)
[facility]  Telephone:(336) [phone] Fax:(336) [phone]   OFFICE PROGRESS NOTE   Cc:  Henri Medal, MD  DIAGNOSIS: stage IV Hodgkin's lymphoma (involvement of bone); IPS of 5 (alb <4; Hgb <10.5; Male, age >75; WBC >15K) or high risk.   CURRENT THERAPY: Started on 06/11/2011 with d1, d15, ABVD with Neulasta injection the day after. Plan is for minimal of 6 cycles total.    INTERVAL HISTORY: Devon Brady 61 y.o. male returns for regular follow up with his wife.  He reports feeling relatively well.  He has mild numbness and tingling of his finger tips.  This does not interfere with his typing/writing.  He still works full time.  Patient denies fever, anorexia, weight loss, fatigue, headache, visual changes, confusion, drenching night sweats, palpable lymph node swelling, mucositis, odynophagia, dysphagia, nausea vomiting, jaundice, chest pain, palpitation, shortness of breath, dyspnea on exertion, productive cough, gum bleeding, epistaxis, hematemesis, hemoptysis, abdominal pain, abdominal swelling, early satiety, melena, hematochezia, hematuria, skin rash, spontaneous bleeding, joint swelling, joint pain, heat or cold intolerance, bowel bladder incontinence, back pain, focal motor weakness, depression, suicidal or homocidal ideation, feeling hopelessness.   Past Medical History  Diagnosis Date  . Hypertension   . Hyperlipidemia   . CAD (coronary artery disease)     s/p 5V CABG 8/11  . RCA occlusion   . Multiple thyroid nodules   . Anemia   . Lymphocytosis   . Lymphadenopathy   . Hydronephrosis   . Renal insufficiency   . Complication of anesthesia     slow to wake up  . Dizziness     occasional for last week  . Hemorrhoid   . Sleep apnea     STOPBANG=5  . Hodgkin's lymphoma     Past Surgical History  Procedure Date  . Vasectomy 1980  . Bunionectomy 1978    right foot  . Cardiac catheterization 10/15/2009  . Coronary artery bypass graft  10/17/2009    LIMA to LAD,SVG to Ramus,SVG to OM sequential to OM2, SVG to marginal of RCA  . Tonsillectomy as child  . Portacath placement 06/10/2011    Procedure: INSERTION PORT-A-CATH;  Surgeon: Almond Lint, MD;  Location: [facility];  Service: General;  Laterality: Left;  subclavian     Current Outpatient Prescriptions  Medication Sig Dispense Refill  . aspirin 81 MG tablet Take 81 mg by mouth.       . cetirizine (ZYRTEC) 10 MG tablet Take 10 mg by mouth daily.      . fluticasone (FLONASE) 50 MCG/ACT nasal spray Place 2 sprays into the nose as needed. Allergies       . HYDROcodone-acetaminophen (NORCO) 5-325 MG per tablet 1 tablet Every 6 hours as needed.      . lidocaine-prilocaine (EMLA) cream APPLY TO PAC 1- 2 HOURS PRIOR TO ACCESS.  30 g  1  . LORazepam (ATIVAN) 0.5 MG tablet Take 1 tablet (0.5 mg total) by mouth every 6 (six) hours as needed (Nausea or vomiting).  30 tablet  0  . metoprolol succinate (TOPROL-XL) 50 MG 24 hr tablet       . metoprolol succinate (TOPROL-XL) 50 MG 24 hr tablet Take 1 tablet (50 mg total) by mouth daily. Take with or immediately following a meal.  30 tablet  2  . Multiple Vitamins-Iron (MULTIVITAMINS WITH IRON) TABS Take 1 tablet by mouth daily.      . Omega-3 Fatty Acids (FISH OIL) 1000 MG CAPS Take 1 capsule by  mouth 3 (three) times daily with meals.       Marland Kitchen omeprazole (PRILOSEC) 40 MG capsule Take 40 mg by mouth every morning.       . ondansetron (ZOFRAN) 8 MG tablet Take 1 tab two times a day starting the day after chemo for 3 days. Then take 1 tab two times a day as needed for nausea or vomiting.  30 tablet  1  . prochlorperazine (COMPAZINE) 10 MG tablet Take 1 tablet (10 mg total) by mouth every 6 (six) hours as needed (Nausea or vomiting).  30 tablet  1  . rosuvastatin (CRESTOR) 20 MG tablet Take 20 mg by mouth every evening.      Marland Kitchen DISCONTD: citalopram (CELEXA) 20 MG tablet Take 1 tablet (20 mg total) by mouth daily.  30 tablet  1  . DISCONTD:  omeprazole (PRILOSEC OTC) 20 MG tablet Take 1 tablet (20 mg total) by mouth daily.  28 tablet  1   No current facility-administered medications for this visit.   Facility-Administered Medications Ordered in Other Visits  Medication Dose Route Frequency Provider Last Rate Last Dose  . 0.9 %  sodium chloride infusion   Intravenous Once Exie Parody, MD 20 mL/hr at 09/17/11 1100    . alteplase (CATHFLO ACTIVASE) injection 2 mg  2 mg Intracatheter Once PRN Exie Parody, MD   2 mg at 09/17/11 1020  . bleomycin (BLEOCIN) 23 Units in sodium chloride 0.9 % 50 mL chemo infusion  10 Units/m2 (Treatment Plan Actual) Intravenous Once Exie Parody, MD 346 mL/hr at 09/17/11 1155 23 Units at 09/17/11 1155  . dacarbazine (DTIC) 840 mg in sodium chloride 0.9 % 250 mL chemo infusion  375 mg/m2 (Treatment Plan Actual) Intravenous Once Exie Parody, MD      . dexamethasone (DECADRON) injection 20 mg  20 mg Intravenous Once Exie Parody, MD   20 mg at 09/17/11 1102  . DOXOrubicin (ADRIAMYCIN) chemo injection 56 mg  25 mg/m2 (Treatment Plan Actual) Intravenous Once Exie Parody, MD   56 mg at 09/17/11 1136  . heparin lock flush 100 unit/mL  500 Units Intracatheter Once PRN Exie Parody, MD      . ondansetron (ZOFRAN) IVPB 16 mg  16 mg Intravenous Once Exie Parody, MD   16 mg at 09/17/11 1102  . sodium chloride 0.9 % injection 10 mL  10 mL Intracatheter PRN Exie Parody, MD      . vinBLAStine (VELBAN) chemo injection 14 mg  6 mg/m2 (Treatment Plan Actual) Intravenous Once Exie Parody, MD   14 mg at 09/17/11 1148    ALLERGIES:   has no known allergies.  REVIEW OF SYSTEMS:  The rest of the 14-point review of system was negative.   Filed Vitals:   09/17/11 0853  BP: 129/83  Pulse: 84  Temp: 97.3 F (36.3 C)   Wt Readings from Last 3 Encounters:  09/17/11 240 lb 12.8 oz (109.226 kg)  09/03/11 240 lb (108.863 kg)  08/20/11 240 lb (108.863 kg)   ECOG Performance status: 0  PHYSICAL EXAMINATION:   General:  well-nourished man, in no  acute distress.  Eyes:  no scleral icterus.  ENT:  There were no oropharyngeal lesions.  Neck was without thyromegaly.  Lymphatics:  Negative cervical, supraclavicular or axillary adenopathy.  Respiratory: lungs were clear bilaterally without wheezing or crackles.  Cardiovascular:  Regular rate and rhythm, S1/S2, without murmur, rub or gallop.  There was no pedal  edema.  GI:  abdomen was soft, flat, nontender, nondistended, without organomegaly.  Muscoloskeletal:  no spinal tenderness of palpation of vertebral spine.  Skin exam was without echymosis, petichae.  Neuro exam was nonfocal.  Patient was able to get on and off exam table without assistance.  Gait was normal.  Patient was alerted and oriented.  Attention was good.   Language was appropriate.  Mood was normal without depression.  Speech was not pressured.  Thought content was not tangential.     LABORATORY/RADIOLOGY DATA:  Lab Results  Component Value Date   WBC 10.5* 09/17/2011   HGB 13.9 09/17/2011   HCT 42.4 09/17/2011   PLT 190 09/17/2011   GLUCOSE 95 09/03/2011   CHOL 122 10/13/2010   TRIG 43.0 10/13/2010   HDL 29.10* 10/13/2010   LDLCALC 84 10/13/2010   ALKPHOS 94 09/03/2011   ALT 9 09/03/2011   AST 17 09/03/2011   NA 140 09/03/2011   K 4.3 09/03/2011   CL 107 09/03/2011   CREATININE 1.52* 09/03/2011   BUN 17 09/03/2011   CO2 23 09/03/2011   PSA 1.28 09/05/2009   INR 1.08 06/10/2011   HGBA1C 6.2 12/04/2009      ASSESSMENT AND PLAN:    1. Hodgkin's lymphoma, stage IV, high risk: He is s/p cycle#2 of chemo ABVD with clinical response. 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  He does not have dose limiting toxicity. There is no indication yet for dose reduction of chemo.  In the future, if his neuropathy persists or worsens, I may consider reducing the dose of Vinblastine.  I advised him to proceed with cycle#4 day #15 of chemo today and return tomorrow for Neulasta tomorrow to decrease the risk of neutropenic fever. After the first cycle, he had grade  3 neutropenia. I want to avoid neutropenic fever and delay of chemo by continuing Neulasta. His next restaging PET scan will be after the 6th cycle.   2. Anemia: Due to chemo. There is no active bleeding. This is grade 1. There is no indication for transfusion.   3. Right flank pain. Pain is improved and he has not required any pain medications since hospital discharge.   4. Thyromegaly with thyroid nodule. Thyroid ultrasound performed on 06/10/2011 showed bilateral thyroid lobe and isthmic solid hypervascular thyroid nodules. The thyroid nodules met criteria for thyroid biopsy but the PET scan did not show any activity. Plan to pursue a thyroid biopsy by interventional radiology once chemotherapy is complete.   5. Hydronephrosis. His new baseline Cr is now around 1.5.   6. Hypertension. Well controlled on metoprolol per PCP.   7. Hyperlipidemia. He'll continue on his Crestor per PCP.   8. Follow up: In 2 weeks for cycle #5, day 1 of ABVD.     FULL CODE.

## 2011-09-18 ENCOUNTER — Ambulatory Visit (HOSPITAL_BASED_OUTPATIENT_CLINIC_OR_DEPARTMENT_OTHER): Payer: BC Managed Care – PPO

## 2011-09-18 VITALS — BP 123/78 | HR 87 | Temp 97.5°F

## 2011-09-18 DIAGNOSIS — C8190 Hodgkin lymphoma, unspecified, unspecified site: Secondary | ICD-10-CM

## 2011-09-18 DIAGNOSIS — Z5189 Encounter for other specified aftercare: Secondary | ICD-10-CM

## 2011-09-18 MED ORDER — PEGFILGRASTIM INJECTION 6 MG/0.6ML
6.0000 mg | Freq: Once | SUBCUTANEOUS | Status: AC
  Administered 2011-09-18: 6 mg via SUBCUTANEOUS

## 2011-10-01 ENCOUNTER — Ambulatory Visit (HOSPITAL_COMMUNITY)
Admission: RE | Admit: 2011-10-01 | Discharge: 2011-10-01 | Disposition: A | Discharge status: Home / Self Care | Payer: BC Managed Care – PPO | Source: Ambulatory Visit | Attending: Oncology | Admitting: Oncology

## 2011-10-01 ENCOUNTER — Other Ambulatory Visit: Payer: Self-pay | Admitting: Oncology

## 2011-10-01 ENCOUNTER — Ambulatory Visit (HOSPITAL_BASED_OUTPATIENT_CLINIC_OR_DEPARTMENT_OTHER): Payer: BC Managed Care – PPO

## 2011-10-01 ENCOUNTER — Ambulatory Visit (HOSPITAL_BASED_OUTPATIENT_CLINIC_OR_DEPARTMENT_OTHER): Payer: BC Managed Care – PPO | Admitting: Oncology

## 2011-10-01 ENCOUNTER — Encounter: Payer: Self-pay | Admitting: Oncology

## 2011-10-01 ENCOUNTER — Telehealth: Payer: Self-pay | Admitting: Oncology

## 2011-10-01 ENCOUNTER — Other Ambulatory Visit (HOSPITAL_BASED_OUTPATIENT_CLINIC_OR_DEPARTMENT_OTHER): Payer: BC Managed Care – PPO | Admitting: Lab

## 2011-10-01 VITALS — BP 133/85 | HR 83 | Temp 98.4°F | Ht 70.0 in | Wt 244.1 lb

## 2011-10-01 DIAGNOSIS — C8190 Hodgkin lymphoma, unspecified, unspecified site: Secondary | ICD-10-CM

## 2011-10-01 DIAGNOSIS — Z5111 Encounter for antineoplastic chemotherapy: Secondary | ICD-10-CM

## 2011-10-01 DIAGNOSIS — Y838 Other surgical procedures as the cause of abnormal reaction of the patient, or of later complication, without mention of misadventure at the time of the procedure: Secondary | ICD-10-CM | POA: Insufficient documentation

## 2011-10-01 DIAGNOSIS — D6481 Anemia due to antineoplastic chemotherapy: Secondary | ICD-10-CM

## 2011-10-01 DIAGNOSIS — E041 Nontoxic single thyroid nodule: Secondary | ICD-10-CM

## 2011-10-01 DIAGNOSIS — Z79899 Other long term (current) drug therapy: Secondary | ICD-10-CM | POA: Insufficient documentation

## 2011-10-01 DIAGNOSIS — Z452 Encounter for adjustment and management of vascular access device: Secondary | ICD-10-CM

## 2011-10-01 DIAGNOSIS — T82898A Other specified complication of vascular prosthetic devices, implants and grafts, initial encounter: Secondary | ICD-10-CM | POA: Insufficient documentation

## 2011-10-01 DIAGNOSIS — N1330 Unspecified hydronephrosis: Secondary | ICD-10-CM

## 2011-10-01 LAB — COMPREHENSIVE METABOLIC PANEL
ALT: <8 U/L (ref 0–53)
CO2: 23 mEq/L (ref 19–32)
Creatinine, Ser: 1.41 mg/dL — ABNORMAL HIGH (ref 0.50–1.35)
Glucose, Bld: 118 mg/dL — ABNORMAL HIGH (ref 70–99)
Potassium: 4 mEq/L (ref 3.5–5.3)
Sodium: 142 mEq/L (ref 135–145)
Total Bilirubin: 0.3 mg/dL (ref 0.3–1.2)

## 2011-10-01 LAB — CBC WITH DIFFERENTIAL/PLATELET
Basophils Absolute: 0.1 10*3/uL (ref 0.0–0.1)
EOS%: 3 % (ref 0.0–7.0)
Eosinophils Absolute: 0.3 10*3/uL (ref 0.0–0.5)
HGB: 13.3 g/dL (ref 13.0–17.1)
MCH: 27.8 pg (ref 27.2–33.4)
MCV: 83.7 fL (ref 79.3–98.0)
MONO%: 8 % (ref 0.0–14.0)
NEUT#: 7.3 10*3/uL — ABNORMAL HIGH (ref 1.5–6.5)
RBC: 4.78 10*6/uL (ref 4.20–5.82)
RDW: 19 % — ABNORMAL HIGH (ref 11.0–14.6)
lymph#: 1.7 10*3/uL (ref 0.9–3.3)

## 2011-10-01 MED ORDER — ONDANSETRON 16 MG/50ML IVPB (CHCC)
16.0000 mg | Freq: Once | INTRAVENOUS | Status: AC
  Administered 2011-10-01: 16 mg via INTRAVENOUS

## 2011-10-01 MED ORDER — SODIUM CHLORIDE 0.9 % IV SOLN
375.0000 mg/m2 | Freq: Once | INTRAVENOUS | Status: AC
  Administered 2011-10-01: 840 mg via INTRAVENOUS
  Filled 2011-10-01: qty 42

## 2011-10-01 MED ORDER — VINBLASTINE SULFATE CHEMO INJECTION 1 MG/ML
6.0000 mg/m2 | Freq: Once | INTRAVENOUS | Status: AC
  Administered 2011-10-01: 14 mg via INTRAVENOUS
  Filled 2011-10-01: qty 14

## 2011-10-01 MED ORDER — SODIUM CHLORIDE 0.9 % IV SOLN
10.0000 [IU]/m2 | Freq: Once | INTRAVENOUS | Status: AC
  Administered 2011-10-01: 23 [IU] via INTRAVENOUS
  Filled 2011-10-01: qty 7.7

## 2011-10-01 MED ORDER — DOXORUBICIN HCL CHEMO IV INJECTION 2 MG/ML
25.0000 mg/m2 | Freq: Once | INTRAVENOUS | Status: AC
  Administered 2011-10-01: 56 mg via INTRAVENOUS
  Filled 2011-10-01: qty 28

## 2011-10-01 MED ORDER — ALTEPLASE 2 MG IJ SOLR
2.0000 mg | Freq: Once | INTRAMUSCULAR | Status: AC | PRN
  Administered 2011-10-01: 2 mg
  Filled 2011-10-01: qty 2

## 2011-10-01 MED ORDER — DEXAMETHASONE SODIUM PHOSPHATE 4 MG/ML IJ SOLN
20.0000 mg | Freq: Once | INTRAMUSCULAR | Status: AC
  Administered 2011-10-01: 20 mg via INTRAVENOUS

## 2011-10-01 MED ORDER — SODIUM CHLORIDE 0.9 % IV SOLN
Freq: Once | INTRAVENOUS | Status: AC
  Administered 2011-10-01: 13:00:00 via INTRAVENOUS

## 2011-10-01 NOTE — Progress Notes (Signed)
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   CURRENT THERAPY: Started on 06/11/2011 with d1, d15, ABVD with Neulasta injection the day after. Plan is for minimal of 6 cycles total.    INTERVAL HISTORY: Devon Brady 61 y.o. male returns for regular follow up with his wife.  He reports feeling relatively well.  He has mild numbness and tingling of his finger tips.  This does not interfere with his typing/writing.  He still works full time.  Patient denies fever, anorexia, weight loss, fatigue, headache, visual changes, confusion, drenching night sweats, palpable lymph node swelling, mucositis, odynophagia, dysphagia, nausea vomiting, jaundice, chest pain, palpitation, shortness of breath, dyspnea on exertion, productive cough, gum bleeding, epistaxis, hematemesis, hemoptysis, abdominal pain, abdominal swelling, early satiety, melena, hematochezia, hematuria, skin rash, spontaneous bleeding, joint swelling, joint pain, heat or cold intolerance, bowel bladder incontinence, back pain, focal motor weakness, depression, suicidal or homocidal ideation, feeling hopelessness.   Past Medical History  Diagnosis Date  . Hypertension   . Hyperlipidemia   . CAD (coronary artery disease)     s/p 5V CABG 8/11  . RCA occlusion   . Multiple thyroid nodules   . Anemia   . Lymphocytosis   . Lymphadenopathy   . Hydronephrosis   . Renal insufficiency   . Complication of anesthesia     slow to wake up  . Dizziness     occasional for last week  . Hemorrhoid   . Sleep apnea     STOPBANG=5  . Hodgkin's lymphoma     Past Surgical History  Procedure Date  . Vasectomy 1980  . Bunionectomy 1978    right foot  . Cardiac catheterization 10/15/2009  . Coronary artery bypass graft  10/17/2009    LIMA to LAD,SVG to Ramus,SVG to OM sequential to OM2, SVG to marginal of RCA  . Tonsillectomy as child  . Portacath placement 06/10/2011    Procedure: INSERTION PORT-A-CATH;  Surgeon: Almond Lint, MD;  Location: [facility];  Service: General;  Laterality: Left;  subclavian     Current Outpatient Prescriptions  Medication Sig Dispense Refill  . aspirin 81 MG tablet Take 81 mg by mouth.       . cetirizine (ZYRTEC) 10 MG tablet Take 10 mg by mouth daily.      . fluticasone (FLONASE) 50 MCG/ACT nasal spray Place 2 sprays into the nose as needed. Allergies       . HYDROcodone-acetaminophen (NORCO) 5-325 MG per tablet 1 tablet Every 6 hours as needed.      . lidocaine-prilocaine (EMLA) cream APPLY TO PAC 1- 2 HOURS PRIOR TO ACCESS.  30 g  1  . LORazepam (ATIVAN) 0.5 MG tablet Take 1 tablet (0.5 mg total) by mouth every 6 (six) hours as needed (Nausea or vomiting).  30 tablet  0  . metoprolol succinate (TOPROL-XL) 50 MG 24 hr tablet       . metoprolol succinate (TOPROL-XL) 50 MG 24 hr tablet Take 1 tablet (50 mg total) by mouth daily. Take with or immediately following a meal.  30 tablet  2  . Multiple Vitamins-Iron (MULTIVITAMINS WITH IRON) TABS Take 1 tablet by mouth daily.      . Omega-3 Fatty Acids (FISH OIL) 1000 MG CAPS Take 1 capsule by  mouth 3 (three) times daily with meals.       Marland Kitchen omeprazole (PRILOSEC) 40 MG capsule Take 40 mg by mouth every morning.       . ondansetron (ZOFRAN) 8 MG tablet Take 1 tab two times a day starting the day after chemo for 3 days. Then take 1 tab two times a day as needed for nausea or vomiting.  30 tablet  1  . prochlorperazine (COMPAZINE) 10 MG tablet Take 1 tablet (10 mg total) by mouth every 6 (six) hours as needed (Nausea or vomiting).  30 tablet  1  . rosuvastatin (CRESTOR) 20 MG tablet Take 20 mg by mouth every evening.      Marland Kitchen DISCONTD: citalopram (CELEXA) 20 MG tablet Take 1 tablet (20 mg total) by mouth daily.  30 tablet  1  . DISCONTD:  omeprazole (PRILOSEC OTC) 20 MG tablet Take 1 tablet (20 mg total) by mouth daily.  28 tablet  1   No current facility-administered medications for this visit.   Facility-Administered Medications Ordered in Other Visits  Medication Dose Route Frequency Provider Last Rate Last Dose  . alteplase (CATHFLO ACTIVASE) injection 2 mg  2 mg Intracatheter Once PRN Exie Parody, MD   2 mg at 10/01/11 1050    ALLERGIES:   has no known allergies.  REVIEW OF SYSTEMS:  The rest of the 14-point review of system was negative.   Filed Vitals:   10/01/11 0909  BP: 133/85  Pulse: 83  Temp: 98.4 F (36.9 C)   Wt Readings from Last 3 Encounters:  10/01/11 244 lb 1.6 oz (110.723 kg)  09/17/11 240 lb 12.8 oz (109.226 kg)  09/03/11 240 lb (108.863 kg)   ECOG Performance status: 0  PHYSICAL EXAMINATION:   General:  well-nourished man, in no acute distress.  Eyes:  no scleral icterus.  ENT:  There were no oropharyngeal lesions.  Neck was without thyromegaly.  Lymphatics:  Negative cervical, supraclavicular or axillary adenopathy.  Respiratory: lungs were clear bilaterally without wheezing or crackles.  Cardiovascular:  Regular rate and rhythm, S1/S2, without murmur, rub or gallop.  There was no pedal edema.  GI:  abdomen was soft, flat, nontender, nondistended, without organomegaly.  Muscoloskeletal:  no spinal tenderness of palpation of vertebral spine.  Skin exam was without echymosis, petichae.  Neuro exam was nonfocal.  Patient was able to get on and off exam table without assistance.  Gait was normal.  Patient was alerted and oriented.  Attention was good.   Language was appropriate.  Mood was normal without depression.  Speech was not pressured.  Thought content was not tangential.     LABORATORY/RADIOLOGY DATA:  Lab Results  Component Value Date   WBC 10.2 10/01/2011   HGB 13.3 10/01/2011   HCT 40.0 10/01/2011   PLT 223 10/01/2011   GLUCOSE 95 09/17/2011   CHOL 122 10/13/2010   TRIG 43.0 10/13/2010    HDL 29.10* 10/13/2010   LDLCALC 84 10/13/2010   ALKPHOS 88 09/17/2011   ALT 9 09/17/2011   AST 17 09/17/2011   NA 140 09/17/2011   K 4.7 09/17/2011   CL 108 09/17/2011   CREATININE 1.50* 09/17/2011   BUN 17 09/17/2011   CO2 26 09/17/2011   PSA 1.28 09/05/2009   INR 1.08 06/10/2011   HGBA1C 6.2 12/04/2009    ASSESSMENT AND PLAN:    1. Hodgkin's lymphoma, stage IV, high risk: He is s/p cycle#4 of chemo ABVD with clinical response. 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  He does not have dose limiting toxicity. There is no indication yet for dose reduction of chemo.  In the future, if his neuropathy persists or worsens, I may consider reducing the dose of Vinblastine.  I advised him to proceed with cycle#5 day #1 of chemo today and return tomorrow for Neulasta tomorrow to decrease the risk of neutropenic fever. After the first cycle, he had grade 3 neutropenia. I want to avoid neutropenic fever and delay of chemo by continuing Neulasta. His next restaging PET scan will be after the 6th cycle.   2. Anemia: Due to chemo. There is no active bleeding. This is grade 1. There is no indication for transfusion.   3. Right flank pain. Pain is improved and he has not required any pain medications since hospital discharge.   4. Thyromegaly with thyroid nodule. Thyroid ultrasound performed on 06/10/2011 showed bilateral thyroid lobe and isthmic solid hypervascular thyroid nodules. The thyroid nodules met criteria for thyroid biopsy but the PET scan did not show any activity. Plan to pursue a thyroid biopsy by interventional radiology once chemotherapy is complete.   5. Hydronephrosis. His new baseline Cr is now around 1.5.   6. Hypertension. Well controlled on metoprolol per PCP.   7. Hyperlipidemia. He'll continue on his Crestor per PCP.   8. Follow up: In 2 weeks for cycle #5, day 15 of ABVD.     FULL CODE.

## 2011-10-01 NOTE — Patient Instructions (Signed)
[facility] Discharge Instructions for Patients Receiving Chemotherapy  Today you received the following chemotherapy agents Adriamycin, Velban, Bleomycin, Dacarbazine  To help prevent nausea and vomiting after your treatment, we encourage you to take your nausea medication as directed by your MD.  If you develop nausea and vomiting that is not controlled by your nausea medication, call the clinic. If it is after clinic hours your family physician or the after hours number for the clinic or go to the Emergency Department.   BELOW ARE SYMPTOMS THAT SHOULD BE REPORTED IMMEDIATELY:  *FEVER GREATER THAN 100.5 F  *CHILLS WITH OR WITHOUT FEVER  NAUSEA AND VOMITING THAT IS NOT CONTROLLED WITH YOUR NAUSEA MEDICATION  *UNUSUAL SHORTNESS OF BREATH  *UNUSUAL BRUISING OR BLEEDING  TENDERNESS IN MOUTH AND THROAT WITH OR WITHOUT PRESENCE OF ULCERS  *URINARY PROBLEMS  *BOWEL PROBLEMS  UNUSUAL RASH Items with * indicate a potential emergency and should be followed up as soon as possible.   Feel free to call the clinic you have any questions or concerns. The clinic phone number is [phone].

## 2011-10-01 NOTE — Telephone Encounter (Signed)
gv pt appt schedule for July/August. Pt will [place] to get established with primary care and has office info.

## 2011-10-01 NOTE — Progress Notes (Signed)
1427- Velban pushed slowly over 3 minutes with NS running by gravity.  Blood return checked every 5 ml and was positive each time, and pre and post infusion.  Pt tolerated well w/o complaints.

## 2011-10-01 NOTE — Progress Notes (Signed)
After 2 hours of having cath-flo instilled into port, at 1250- no blood was still noted.  Per Clenton Pare, NP, pt may receive treatment peripherally, and pt to go for dye study at [facility] after treatment.  Pt in agreement with plan.  At 1405- Chrystie Nose, RN obtained blood return from pt's port-a-cath.  Port flushed well w/o difficulty, and NS ran by gravity w/o difficulty.  Pt preferred to have tx through port.  Peripheral IV d/c'ed.  Pt still to go for dye study, per Belenda Cruise, NP and pt request.

## 2011-10-02 ENCOUNTER — Ambulatory Visit (HOSPITAL_BASED_OUTPATIENT_CLINIC_OR_DEPARTMENT_OTHER): Payer: BC Managed Care – PPO

## 2011-10-02 VITALS — BP 131/82 | HR 86 | Temp 98.1°F

## 2011-10-02 DIAGNOSIS — Z5189 Encounter for other specified aftercare: Secondary | ICD-10-CM

## 2011-10-02 DIAGNOSIS — C8190 Hodgkin lymphoma, unspecified, unspecified site: Secondary | ICD-10-CM

## 2011-10-02 MED ORDER — PEGFILGRASTIM INJECTION 6 MG/0.6ML
6.0000 mg | Freq: Once | SUBCUTANEOUS | Status: AC
  Administered 2011-10-02: 6 mg via SUBCUTANEOUS

## 2011-10-06 ENCOUNTER — Other Ambulatory Visit: Payer: Self-pay | Admitting: Radiology

## 2011-10-07 ENCOUNTER — Other Ambulatory Visit: Payer: Self-pay | Admitting: Radiology

## 2011-10-07 ENCOUNTER — Encounter (HOSPITAL_COMMUNITY): Payer: Self-pay

## 2011-10-07 ENCOUNTER — Other Ambulatory Visit: Payer: Self-pay | Admitting: Oncology

## 2011-10-07 ENCOUNTER — Ambulatory Visit (HOSPITAL_COMMUNITY)
Admission: RE | Admit: 2011-10-07 | Discharge: 2011-10-07 | Disposition: A | Discharge status: Home / Self Care | Payer: BC Managed Care – PPO | Source: Ambulatory Visit | Attending: Oncology | Admitting: Oncology

## 2011-10-07 DIAGNOSIS — G4730 Sleep apnea, unspecified: Secondary | ICD-10-CM | POA: Insufficient documentation

## 2011-10-07 DIAGNOSIS — E785 Hyperlipidemia, unspecified: Secondary | ICD-10-CM | POA: Insufficient documentation

## 2011-10-07 DIAGNOSIS — I10 Essential (primary) hypertension: Secondary | ICD-10-CM | POA: Insufficient documentation

## 2011-10-07 DIAGNOSIS — C8190 Hodgkin lymphoma, unspecified, unspecified site: Secondary | ICD-10-CM | POA: Insufficient documentation

## 2011-10-07 DIAGNOSIS — T82898A Other specified complication of vascular prosthetic devices, implants and grafts, initial encounter: Secondary | ICD-10-CM | POA: Insufficient documentation

## 2011-10-07 DIAGNOSIS — I2510 Atherosclerotic heart disease of native coronary artery without angina pectoris: Secondary | ICD-10-CM | POA: Insufficient documentation

## 2011-10-07 DIAGNOSIS — Z951 Presence of aortocoronary bypass graft: Secondary | ICD-10-CM | POA: Insufficient documentation

## 2011-10-07 DIAGNOSIS — Y849 Medical procedure, unspecified as the cause of abnormal reaction of the patient, or of later complication, without mention of misadventure at the time of the procedure: Secondary | ICD-10-CM | POA: Insufficient documentation

## 2011-10-07 MED ORDER — FENTANYL CITRATE 0.05 MG/ML IJ SOLN
INTRAMUSCULAR | Status: AC
  Filled 2011-10-07: qty 2

## 2011-10-07 MED ORDER — IOHEXOL 350 MG/ML SOLN
5.0000 mL | Freq: Once | INTRAVENOUS | Status: AC | PRN
  Administered 2011-10-07: 5 mL via INTRAVENOUS

## 2011-10-07 MED ORDER — MIDAZOLAM HCL 2 MG/2ML IJ SOLN
INTRAMUSCULAR | Status: AC
  Filled 2011-10-07: qty 6

## 2011-10-07 MED ORDER — FENTANYL CITRATE 0.05 MG/ML IJ SOLN
INTRAMUSCULAR | Status: AC
  Filled 2011-10-07: qty 4

## 2011-10-07 MED ORDER — MIDAZOLAM HCL 5 MG/5ML IJ SOLN
INTRAMUSCULAR | Status: AC | PRN
  Administered 2011-10-07: 2 mg via INTRAVENOUS

## 2011-10-07 MED ORDER — FENTANYL CITRATE 0.05 MG/ML IJ SOLN
INTRAMUSCULAR | Status: AC | PRN
  Administered 2011-10-07: 100 ug via INTRAVENOUS

## 2011-10-07 MED ORDER — LIDOCAINE HCL 1 % IJ SOLN
INTRAMUSCULAR | Status: AC
  Filled 2011-10-07: qty 20

## 2011-10-07 MED ORDER — HYDROCODONE-ACETAMINOPHEN 5-325 MG PO TABS: 1.0000 | ORAL_TABLET | ORAL | Status: DC | PRN

## 2011-10-07 NOTE — Procedures (Signed)
Successful stripping of port.  The port now aspirates and flushes well.  No immediate complication.

## 2011-10-07 NOTE — H&P (Signed)
Chief Complaint: Dysfunctional portacath Referring Physician:Ha HPI: Devon Brady is an 61 y.o. male who has been receiving chemotherapy for Hodgkin's Lymphoma but has recently ran into trouble with his port. It is thought that a fibrin sheath is causing the dysfunction. He is scheduled for attempt at stripping. The pt reports he has treatments every other week but only has 3 or 4 left.  We discussed with Dr. Gaylyn Rong that if stripping is unsuccesfull, we would remove port and place a PICC to allow him to receive his remaining treatments. He has otherwise been well.  Past Medical History:  Past Medical History  Diagnosis Date  . Hypertension   . Hyperlipidemia   . CAD (coronary artery disease)     s/p 5V CABG 8/11  . RCA occlusion   . Multiple thyroid nodules   . Anemia   . Lymphocytosis   . Lymphadenopathy   . Hydronephrosis   . Renal insufficiency   . Complication of anesthesia     slow to wake up  . Dizziness     occasional for last week  . Hemorrhoid   . Sleep apnea     STOPBANG=5  . Hodgkin's lymphoma     Past Surgical History:  Past Surgical History  Procedure Date  . Vasectomy 1980  . Bunionectomy 1978    right foot  . Cardiac catheterization 10/15/2009  . Coronary artery bypass graft 10/17/2009    LIMA to LAD,SVG to Ramus,SVG to OM sequential to OM2, SVG to marginal of RCA  . Tonsillectomy as child  . Portacath placement 06/10/2011    Procedure: INSERTION PORT-A-CATH;  Surgeon: Almond Lint, MD;  Location: [facility];  Service: General;  Laterality: Left;  subclavian     Family History:  Family History  Problem Relation Age of Onset  . Alcohol abuse    . Arthritis    . Diabetes Father   . Coronary artery disease Father 66    dscd  . Kidney disease    . Prostate cancer    . Hypertension Mother   . Hyperlipidemia      Social History:  reports that he has never smoked. He has never used smokeless tobacco. He reports that he drinks about .6 ounces of alcohol per  week. He reports that he does not use illicit drugs.  Allergies: No Known Allergies  Medications: Current Outpatient Prescriptions  Medication  Sig  Dispense  Refill  .  aspirin 81 MG tablet  Take 81 mg by mouth.  .  cetirizine (ZYRTEC) 10 MG tablet  Take 10 mg by mouth daily.  .  fluticasone (FLONASE) 50 MCG/ACT nasal spray Place 2 sprays into the nose as needed. Allergies  .  HYDROcodone-acetaminophen (NORCO) 5-325 MG per tablet  1 tablet Every 6 hours as needed.  .  lidocaine-prilocaine (EMLA) cream  APPLY TO PAC 1- 2 HOURS PRIOR TO ACCESS.  30 g  1  .  LORazepam (ATIVAN) 0.5 MG tablet  Take 1 tablet (0.5 mg total) by mouth every 6 (six) hours as needed (Nausea or vomiting).  30 tablet  0  .  metoprolol succinate (TOPROL-XL) 50 MG 24 hr tablet  .  metoprolol succinate (TOPROL-XL) 50 MG 24 hr tablet  Take 1 tablet (50 mg total) by mouth daily. Take with or immediately following a meal.  30 tablet  2  .  Multiple Vitamins-Iron (MULTIVITAMINS WITH IRON) TABS  Take 1 tablet by mouth daily.  .  Omega-3 Fatty Acids (FISH OIL) 1000  MG CAPS  Take 1 capsule by mouth 3 (three) times daily with meals.  Marland Kitchen  omeprazole (PRILOSEC) 40 MG capsule  Take 40 mg by mouth every morning.  .  ondansetron (ZOFRAN) 8 MG tablet  Take 1 tab two times a day starting the day after chemo for 3 days. Then take 1 tab two times a day as needed for nausea or vomiting.  30 tablet  1  .  prochlorperazine (COMPAZINE) 10 MG tablet  Take 1 tablet (10 mg total) by mouth every 6 (six) hours as needed (Nausea or vomiting).  30 tablet  1  .  rosuvastatin (CRESTOR) 20 MG tablet  Take 20 mg by mouth    Please HPI for pertinent positives, otherwise complete 10 system ROS negative.  Physical Exam: Blood pressure 122/80, pulse 83, temperature 98.1 F (36.7 C), SpO2 97.00%. There is no height or weight on file to calculate BMI.   General Appearance:  Alert, cooperative, no distress, appears  stated age  Head:  Normocephalic, without obvious abnormality, atraumatic  ENT: Unremarkable  Neck: Supple, symmetrical, trachea midline, no adenopathy, thyroid: not enlarged, symmetric, no tenderness/mass/nodules  Lungs:   Clear to auscultation bilaterally, no w/r/r, respirations unlabored without use of accessory muscles.  Chest Wall:  No tenderness or deformity, (L)upper chest port intact  Heart:  Regular rate and rhythm, S1, S2 normal, no murmur, rub or gallop. Carotids 2+ without bruit.  Abdomen:   Soft, non-tender, non distended. Bowel sounds active all four quadrants,  no masses, no organomegaly.  Extremities: Extremities normal, atraumatic, no cyanosis or edema  Neurologic: Normal affect, no gross deficits.   No results found for this or any previous visit (from the past 48 hour(s)). No results found.  Assessment/Plan Hodgkin's lymphoma Dysfunctional portacath Attempt at stripping, possible port removal and PICC placement if unsuccessful.  Procedure reviewed with pt and wife. Consent signed in chart  Brayton El PA-C 10/07/2011, 9:16 AM

## 2011-10-14 NOTE — Patient Instructions (Addendum)
1.  Diagnosis:  Hodgkin lymphoma. 2.  Treatment:  ABVD.  Doing well except for neuropathy.  I will stop Vinblastine chemo and just go on ABD.   You have had 5 cycles after today.  One more cycle (two infusions) to go!

## 2011-10-15 ENCOUNTER — Ambulatory Visit (HOSPITAL_BASED_OUTPATIENT_CLINIC_OR_DEPARTMENT_OTHER): Payer: BC Managed Care – PPO

## 2011-10-15 ENCOUNTER — Ambulatory Visit (HOSPITAL_BASED_OUTPATIENT_CLINIC_OR_DEPARTMENT_OTHER): Payer: BC Managed Care – PPO | Admitting: Oncology

## 2011-10-15 ENCOUNTER — Other Ambulatory Visit: Payer: BC Managed Care – PPO

## 2011-10-15 VITALS — BP 121/78 | HR 85 | Temp 97.8°F | Resp 20 | Ht 70.0 in | Wt 242.2 lb

## 2011-10-15 DIAGNOSIS — N1330 Unspecified hydronephrosis: Secondary | ICD-10-CM

## 2011-10-15 DIAGNOSIS — R109 Unspecified abdominal pain: Secondary | ICD-10-CM

## 2011-10-15 DIAGNOSIS — C8190 Hodgkin lymphoma, unspecified, unspecified site: Secondary | ICD-10-CM

## 2011-10-15 DIAGNOSIS — E049 Nontoxic goiter, unspecified: Secondary | ICD-10-CM

## 2011-10-15 DIAGNOSIS — D649 Anemia, unspecified: Secondary | ICD-10-CM

## 2011-10-15 DIAGNOSIS — Z5111 Encounter for antineoplastic chemotherapy: Secondary | ICD-10-CM

## 2011-10-15 MED ORDER — DEXAMETHASONE SODIUM PHOSPHATE 4 MG/ML IJ SOLN
20.0000 mg | Freq: Once | INTRAMUSCULAR | Status: AC
  Administered 2011-10-15: 20 mg via INTRAVENOUS

## 2011-10-15 MED ORDER — SODIUM CHLORIDE 0.9 % IV SOLN
10.0000 [IU]/m2 | Freq: Once | INTRAVENOUS | Status: AC
  Administered 2011-10-15: 23 [IU] via INTRAVENOUS
  Filled 2011-10-15: qty 7.7

## 2011-10-15 MED ORDER — SODIUM CHLORIDE 0.9 % IV SOLN
Freq: Once | INTRAVENOUS | Status: AC
  Administered 2011-10-15: 10:00:00 via INTRAVENOUS

## 2011-10-15 MED ORDER — VINBLASTINE SULFATE CHEMO INJECTION 1 MG/ML
3.0000 mg/m2 | Freq: Once | INTRAVENOUS | Status: AC
  Administered 2011-10-15: 7 mg via INTRAVENOUS
  Filled 2011-10-15: qty 7

## 2011-10-15 MED ORDER — ONDANSETRON 16 MG/50ML IVPB (CHCC)
16.0000 mg | Freq: Once | INTRAVENOUS | Status: AC
  Administered 2011-10-15: 16 mg via INTRAVENOUS

## 2011-10-15 MED ORDER — DOXORUBICIN HCL CHEMO IV INJECTION 2 MG/ML
25.0000 mg/m2 | Freq: Once | INTRAVENOUS | Status: AC
  Administered 2011-10-15: 56 mg via INTRAVENOUS
  Filled 2011-10-15: qty 28

## 2011-10-15 MED ORDER — SODIUM CHLORIDE 0.9 % IJ SOLN
10.0000 mL | INTRAMUSCULAR | Status: DC | PRN
  Administered 2011-10-15: 10 mL
  Filled 2011-10-15: qty 10

## 2011-10-15 MED ORDER — HEPARIN SOD (PORK) LOCK FLUSH 100 UNIT/ML IV SOLN
500.0000 [IU] | Freq: Once | INTRAVENOUS | Status: AC | PRN
  Administered 2011-10-15: 500 [IU]
  Filled 2011-10-15: qty 5

## 2011-10-15 MED ORDER — SODIUM CHLORIDE 0.9 % IV SOLN
375.0000 mg/m2 | Freq: Once | INTRAVENOUS | Status: AC
  Administered 2011-10-15: 840 mg via INTRAVENOUS
  Filled 2011-10-15: qty 42

## 2011-10-15 NOTE — Patient Instructions (Addendum)
[facility] Discharge Instructions for Patients Receiving Chemotherapy  Today you received the following chemotherapy agents Doxorubicin, Vinblastine, Bleomycin, DTIC  To help prevent nausea and vomiting after your treatment, we encourage you to take your nausea medication  Begin taking it at 7 pm and take it as often as prescribed for the next 24 to 72 hours.   If you develop nausea and vomiting that is not controlled by your nausea medication, call the clinic. If it is after clinic hours your family physician or the after hours number for the clinic or go to the Emergency Department.   BELOW ARE SYMPTOMS THAT SHOULD BE REPORTED IMMEDIATELY:  *FEVER GREATER THAN 100.5 F  *CHILLS WITH OR WITHOUT FEVER  NAUSEA AND VOMITING THAT IS NOT CONTROLLED WITH YOUR NAUSEA MEDICATION  *UNUSUAL SHORTNESS OF BREATH  *UNUSUAL BRUISING OR BLEEDING  TENDERNESS IN MOUTH AND THROAT WITH OR WITHOUT PRESENCE OF ULCERS  *URINARY PROBLEMS  *BOWEL PROBLEMS  UNUSUAL RASH Items with * indicate a potential emergency and should be followed up as soon as possible.  One of the nurses will contact you 24 hours after your treatment. Please let the nurse know about any problems that you may have experienced. Feel free to call the clinic you have any questions or concerns. The clinic phone number is [phone].   I have been informed and understand all the instructions given to me. I know to contact the clinic, my physician, or go to the Emergency Department if any problems should occur. I do not have any questions at this time, but understand that I may call the clinic during office hours   should I have any questions or need assistance in obtaining follow up care.    __________________________________________  _____________  __________ Signature of Patient or Authorized Representative            Date                   Time    __________________________________________ Nurse's  Signature

## 2011-10-15 NOTE — Progress Notes (Signed)
[facility]  Telephone:(336) [phone] Fax:(336) [phone]   OFFICE PROGRESS NOTE   Cc:  Henri Medal, MD  DIAGNOSIS: stage IV Hodgkin's lymphoma (involvement of bone); IPS of 5 (alb <4; Hgb <10.5; Male, age >54; WBC >15K) or high risk.   CURRENT THERAPY: Started on 06/11/2011 with d1, d15, ABVD with Neulasta injection the day after. Plan is for minimal of 6 cycles total.   INTERVAL HISTORY: CONTRELL BALLENTINE 61 y.o. male returns for regular follow up with his wife.  He reported that he has been having more shooting pain in his bilateral wrists (right worse than left).  He has no problem with typing, writing, texting, using utensils.  He still works full time.  He does not feel any palpable nodes.  He denies any back pain or need of pain meds.   Patient denies fever, anorexia, weight loss, fatigue, headache, visual changes, confusion, drenching night sweats, palpable lymph node swelling, mucositis, odynophagia, dysphagia, nausea vomiting, jaundice, chest pain, palpitation, shortness of breath, dyspnea on exertion, productive cough, gum bleeding, epistaxis, hematemesis, hemoptysis, abdominal pain, abdominal swelling, early satiety, melena, hematochezia, hematuria, skin rash, spontaneous bleeding, joint swelling, joint pain, heat or cold intolerance, bowel bladder incontinence, back pain, focal motor weakness, depression, suicidal or homicidal ideation, feeling hopelessness.     Past Medical History  Diagnosis Date  . Hypertension   . Hyperlipidemia   . CAD (coronary artery disease)     s/p 5V CABG 8/11  . RCA occlusion   . Multiple thyroid nodules   . Anemia   . Lymphocytosis   . Lymphadenopathy   . Hydronephrosis   . Renal insufficiency   . Complication of anesthesia     slow to wake up  . Dizziness     occasional for last week  . Hemorrhoid   . Sleep apnea     STOPBANG=5  . Hodgkin's lymphoma     Past Surgical History  Procedure Date  . Vasectomy  1980  . Bunionectomy 1978    right foot  . Cardiac catheterization 10/15/2009  . Coronary artery bypass graft 10/17/2009    LIMA to LAD,SVG to Ramus,SVG to OM sequential to OM2, SVG to marginal of RCA  . Tonsillectomy as child  . Portacath placement 06/10/2011    Procedure: INSERTION PORT-A-CATH;  Surgeon: Almond Lint, MD;  Location: [facility];  Service: General;  Laterality: Left;  subclavian     Current Outpatient Prescriptions  Medication Sig Dispense Refill  . aspirin 81 MG tablet Take 81 mg by mouth.       Marland Kitchen HYDROcodone-acetaminophen (NORCO) 5-325 MG per tablet 1 tablet Every 6 hours as needed.      . lidocaine-prilocaine (EMLA) cream APPLY TO PAC 1- 2 HOURS PRIOR TO ACCESS.  30 g  1  . LORazepam (ATIVAN) 0.5 MG tablet Take 1 tablet (0.5 mg total) by mouth every 6 (six) hours as needed (Nausea or vomiting).  30 tablet  0  . metoprolol succinate (TOPROL-XL) 50 MG 24 hr tablet       . metoprolol succinate (TOPROL-XL) 50 MG 24 hr tablet Take 1 tablet (50 mg total) by mouth daily. Take with or immediately following a meal.  30 tablet  2  . ondansetron (ZOFRAN) 8 MG tablet Take 1 tab two times a day starting the day after chemo for 3 days. Then take 1 tab two times a day as needed for nausea or vomiting.  30 tablet  1  . rosuvastatin (CRESTOR)  20 MG tablet Take 20 mg by mouth every evening.      . cetirizine (ZYRTEC) 10 MG tablet Take 10 mg by mouth daily.      . prochlorperazine (COMPAZINE) 10 MG tablet Take 1 tablet (10 mg total) by mouth every 6 (six) hours as needed (Nausea or vomiting).  30 tablet  1  . DISCONTD: citalopram (CELEXA) 20 MG tablet Take 1 tablet (20 mg total) by mouth daily.  30 tablet  1  . DISCONTD: omeprazole (PRILOSEC OTC) 20 MG tablet Take 1 tablet (20 mg total) by mouth daily.  28 tablet  1   No current facility-administered medications for this visit.   Facility-Administered Medications Ordered in Other Visits  Medication Dose Route Frequency Provider Last Rate Last  Dose  . 0.9 %  sodium chloride infusion   Intravenous Once Exie Parody, MD 20 mL/hr at 10/15/11 1012    . bleomycin (BLEOCIN) 23 Units in sodium chloride 0.9 % 50 mL chemo infusion  10 Units/m2 (Treatment Plan Actual) Intravenous Once Exie Parody, MD   23 Units at 10/15/11 1122  . dacarbazine (DTIC) 840 mg in sodium chloride 0.9 % 250 mL chemo infusion  375 mg/m2 (Treatment Plan Actual) Intravenous Once Exie Parody, MD 292 mL/hr at 10/15/11 1145 840 mg at 10/15/11 1145  . dexamethasone (DECADRON) injection 20 mg  20 mg Intravenous Once Exie Parody, MD   20 mg at 10/15/11 1012  . DOXOrubicin (ADRIAMYCIN) chemo injection 56 mg  25 mg/m2 (Treatment Plan Actual) Intravenous Once Exie Parody, MD   56 mg at 10/15/11 1108  . heparin lock flush 100 unit/mL  500 Units Intracatheter Once PRN Exie Parody, MD      . ondansetron (ZOFRAN) IVPB 16 mg  16 mg Intravenous Once Exie Parody, MD   16 mg at 10/15/11 1012  . sodium chloride 0.9 % injection 10 mL  10 mL Intracatheter PRN Exie Parody, MD      . vinBLAStine (VELBAN) chemo injection 7 mg  3 mg/m2 (Treatment Plan Actual) Intravenous Once Exie Parody, MD   7 mg at 10/15/11 1120    ALLERGIES:   has no known allergies.  REVIEW OF SYSTEMS:  The rest of the 14-point review of system was negative.   Filed Vitals:   10/15/11 0914  BP: 121/78  Pulse: 85  Temp: 97.8 F (36.6 C)  Resp: 20   Wt Readings from Last 3 Encounters:  10/15/11 242 lb 3.2 oz (109.861 kg)  10/01/11 244 lb 1.6 oz (110.723 kg)  09/17/11 240 lb 12.8 oz (109.226 kg)   ECOG Performance status: 0  PHYSICAL EXAMINATION:   General:  well-nourished in no acute distress.  Eyes:  no scleral icterus.  ENT:  There were no oropharyngeal lesions.  Neck was without thyromegaly.  Lymphatics:  Negative cervical, supraclavicular or axillary adenopathy.  Respiratory: lungs were clear bilaterally without wheezing or crackles.  Cardiovascular:  Regular rate and rhythm, S1/S2, without murmur, rub or gallop.  There was no  pedal edema.  GI:  abdomen was soft, flat, nontender, nondistended, without organomegaly.  Muscoloskeletal:  no spinal tenderness of palpation of vertebral spine.  Skin exam was without echymosis, petichae.  Neuro exam was nonfocal.  Patient was able to get on and off exam table without assistance.  Gait was normal.  Patient was alerted and oriented.  Attention was good.   Language was appropriate.  Mood was normal without depression.  Speech  was not pressured.  Thought content was not tangential.    LABORATORY/RADIOLOGY DATA:  Lab Results  Component Value Date   WBC 10.2 10/01/2011   HGB 13.3 10/01/2011   HCT 40.0 10/01/2011   PLT 223 10/01/2011   GLUCOSE 118* 10/01/2011   CHOL 122 10/13/2010   TRIG 43.0 10/13/2010   HDL 29.10* 10/13/2010   LDLCALC 84 10/13/2010   ALKPHOS 95 10/01/2011   ALT <8 10/01/2011   AST 17 10/01/2011   NA 142 10/01/2011   K 4.0 10/01/2011   CL 108 10/01/2011   CREATININE 1.41* 10/01/2011   BUN 14 10/01/2011   CO2 23 10/01/2011   PSA 1.28 09/05/2009   INR 1.08 06/10/2011   HGBA1C 6.2 12/04/2009    ASSESSMENT AND PLAN:   1. Hodgkin's lymphoma, stage IV, high risk: He is s/p cycle#2 of chemo ABVD with clinical response. 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 I therefore recommended dose reduction of Vinblastine by 50%.  He expressed informed understanding and wished to proceed.  2. Right flank pain. Pain is improved and he has not required any pain medications since hospital discharge.  3. Thyromegaly with thyroid nodule. Thyroid ultrasound performed on 06/10/2011 showed bilateral thyroid lobe and isthmic solid hypervascular thyroid nodules. The thyroid nodules met criteria for thyroid biopsy but the PET scan did not show any activity. Plan to pursue a thyroid biopsy by interventional radiology once chemotherapy is complete and if post treatment PET scan still shows concerning uptake.  4. Hydronephrosis. His new baseline Cr is now around 1.5.  5. Hypertension. Well controlled on  metoprolol per PCP.  7. Hyperlipidemia. He'll continue on his Crestor per PCP.  8. Follow up: In 2 weeks for cycle #6, day 1 of ABVD.

## 2011-10-16 ENCOUNTER — Ambulatory Visit (HOSPITAL_BASED_OUTPATIENT_CLINIC_OR_DEPARTMENT_OTHER): Payer: BC Managed Care – PPO

## 2011-10-16 VITALS — BP 137/87 | HR 89 | Temp 97.4°F | Resp 18

## 2011-10-16 DIAGNOSIS — Z5189 Encounter for other specified aftercare: Secondary | ICD-10-CM

## 2011-10-16 DIAGNOSIS — C8190 Hodgkin lymphoma, unspecified, unspecified site: Secondary | ICD-10-CM

## 2011-10-16 MED ORDER — PEGFILGRASTIM INJECTION 6 MG/0.6ML
6.0000 mg | Freq: Once | SUBCUTANEOUS | Status: AC
  Administered 2011-10-16: 6 mg via SUBCUTANEOUS

## 2011-10-25 ENCOUNTER — Encounter: Payer: Self-pay | Admitting: Cardiovascular Disease

## 2011-10-25 ENCOUNTER — Ambulatory Visit (INDEPENDENT_AMBULATORY_CARE_PROVIDER_SITE_OTHER): Payer: BC Managed Care – PPO | Admitting: Cardiovascular Disease

## 2011-10-25 VITALS — BP 112/79 | HR 96 | Wt 236.0 lb

## 2011-10-25 DIAGNOSIS — I2510 Atherosclerotic heart disease of native coronary artery without angina pectoris: Secondary | ICD-10-CM

## 2011-10-25 MED ORDER — NITROGLYCERIN 0.4 MG SL SUBL: 0.4000 mg | SUBLINGUAL_TABLET | SUBLINGUAL | Status: DC | PRN

## 2011-10-25 MED ORDER — ROSUVASTATIN CALCIUM 20 MG PO TABS: ORAL_TABLET | ORAL | Status: DC

## 2011-10-25 NOTE — Progress Notes (Signed)
History of Present Illness: 61 yo Devon Brady with history of CAD s/p CABG August 2011, HTN, hyperlipidemia, stage IV Hodgkins lymphoma who is here today for cardiac follow up. He has been followed in the past by Dr. Juanda Chance. I saw him for the first time in July 2012. Cath in August 2011 with severe triple vessel disease. He underwent 5V CABG August 2011 per Dr. Cornelius Moras with LIMA to LAD, SVG to Ramus, SVG sequential to OM1/OM2, SVG to RV marginal.   He tells me that he has been doing well. No exertional chest pains or SOB. No palpitations. He has had some pain in his hips/thighs and he worries that this may be his Crestor.   Primary Care Physician: Dr. Laury Axon  Last Lipid Profile:  Lipid Panel     Component Value Date/Time   CHOL 122 10/13/2010 0946   TRIG 43.0 10/13/2010 0946   HDL 29.10* 10/13/2010 0946   CHOLHDL 4 10/13/2010 0946   VLDL 8.6 10/13/2010 0946   LDLCALC 84 10/13/2010 0946     Past Medical History  Diagnosis Date  . Hypertension   . Hyperlipidemia   . CAD (coronary artery disease)     s/p 5V CABG 8/11  . RCA occlusion   . Multiple thyroid nodules   . Anemia   . Lymphocytosis   . Lymphadenopathy   . Hydronephrosis   . Renal insufficiency   . Complication of anesthesia     slow to wake up  . Dizziness     occasional for last week  . Hemorrhoid   . Sleep apnea     STOPBANG=5  . Hodgkin's lymphoma     Past Surgical History  Procedure Date  . Vasectomy 1980  . Bunionectomy 1978    right foot  . Cardiac catheterization 10/15/2009  . Coronary artery bypass graft 10/17/2009    LIMA to LAD,SVG to Ramus,SVG to OM sequential to OM2, SVG to marginal of RCA  . Tonsillectomy as child  . Portacath placement 06/10/2011    Procedure: INSERTION PORT-A-CATH;  Surgeon: Almond Lint, MD;  Location: [facility];  Service: General;  Laterality: Left;  subclavian     Current Outpatient Prescriptions  Medication Sig Dispense Refill  . aspirin 81 MG tablet Take 81 mg by mouth.       .  cetirizine (ZYRTEC) 10 MG tablet Take 10 mg by mouth daily.      Marland Kitchen HYDROcodone-acetaminophen (NORCO) 5-325 MG per tablet 1 tablet Every 6 hours as needed.      . lidocaine-prilocaine (EMLA) cream APPLY TO PAC 1- 2 HOURS PRIOR TO ACCESS.  30 g  1  . LORazepam (ATIVAN) 0.5 MG tablet Take 1 tablet (0.5 mg total) by mouth every 6 (six) hours as needed (Nausea or vomiting).  30 tablet  0  . metoprolol succinate (TOPROL-XL) 50 MG 24 hr tablet       . metoprolol succinate (TOPROL-XL) 50 MG 24 hr tablet Take 1 tablet (50 mg total) by mouth daily. Take with or immediately following a meal.  30 tablet  2  . ondansetron (ZOFRAN) 8 MG tablet Take 1 tab two times a day starting the day after chemo for 3 days. Then take 1 tab two times a day as needed for nausea or vomiting.  30 tablet  1  . prochlorperazine (COMPAZINE) 10 MG tablet Take 1 tablet (10 mg total) by mouth every 6 (six) hours as needed (Nausea or vomiting).  30 tablet  1  . rosuvastatin (CRESTOR)  20 MG tablet Take 20 mg by mouth every evening.      Marland Kitchen DISCONTD: citalopram (CELEXA) 20 MG tablet Take 1 tablet (20 mg total) by mouth daily.  30 tablet  1  . DISCONTD: omeprazole (PRILOSEC OTC) 20 MG tablet Take 1 tablet (20 mg total) by mouth daily.  28 tablet  1    No Known Allergies  History   Social History  . Marital Status: Married    Spouse Name: N/A    Number of Children: 2  . Years of Education: N/A   Occupational History  . 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    Social History Main Topics  . Smoking status: Never Smoker   . Smokeless tobacco: Never Used  . Alcohol Use: 0.6 oz/week    1 Cans of beer per week     occasional  . Drug Use: No  . Sexually Active: Not on file   Other Topics Concern  . Not on file   Social History Narrative  . No narrative on file    Family History  Problem Relation Age of Onset  . Alcohol abuse    . Arthritis    . Diabetes Father   . Coronary artery disease Father 26    dscd  .  Kidney disease    . Prostate cancer    . Hypertension Mother   . Hyperlipidemia      Review of Systems:  As stated in the HPI and otherwise negative.   BP 112/79  Pulse 96  Wt 236 lb (107.049 kg)  Physical Examination: General: Well developed, well nourished, NAD HEENT: OP clear, mucus membranes moist SKIN: warm, dry. No rashes. Neuro: No focal deficits Musculoskeletal: Muscle strength 5/5 all ext Psychiatric: Mood and affect normal Neck: No JVD, no carotid bruits, no thyromegaly, no lymphadenopathy. Lungs:Clear bilaterally, no wheezes, rhonci, crackles Cardiovascular: Regular rate and rhythm. No murmurs, gallops or rubs. Abdomen:Soft. Bowel sounds present. Non-tender.  Extremities: No lower extremity edema. Pulses are 2 + in the bilateral DP/PT.  EKG: March 2013 with NSR.

## 2011-10-25 NOTE — Addendum Note (Signed)
Addended by: Dossie Arbour on: 10/25/2011 09:55 AM   Modules accepted: Orders

## 2011-10-25 NOTE — Patient Instructions (Addendum)
Your physician wants you to follow-up in:  12 months. You will receive a reminder letter in the mail two months in advance. If you don't receive a letter, please call our office to schedule the follow-up appointment.  You have been referred to  Dr. Knute Neu care at [facility]  Your physician recommends that you return for fasting lab work in:  12 weeks--Lipid and Liver profile  Your physician has recommended you make the following change in your medication: Change Crestor to 20 mg by mouth every other day

## 2011-10-25 NOTE — Assessment & Plan Note (Signed)
Stable. He has no chest pain. BP is well controlled. Lipids well controlled. Will lower Crestor to 20 mg po every other day. Repeat lipids and LfTs in 12 weeks. Will continue ASA, beta blocker. Will give prescription for NTG SL prn.

## 2011-10-28 ENCOUNTER — Encounter: Payer: Self-pay | Admitting: *Deleted

## 2011-10-29 ENCOUNTER — Encounter: Payer: Self-pay | Admitting: Dietician

## 2011-10-29 ENCOUNTER — Other Ambulatory Visit (HOSPITAL_BASED_OUTPATIENT_CLINIC_OR_DEPARTMENT_OTHER): Payer: BC Managed Care – PPO

## 2011-10-29 ENCOUNTER — Ambulatory Visit (HOSPITAL_BASED_OUTPATIENT_CLINIC_OR_DEPARTMENT_OTHER): Payer: BC Managed Care – PPO | Admitting: Oncology

## 2011-10-29 ENCOUNTER — Ambulatory Visit (HOSPITAL_BASED_OUTPATIENT_CLINIC_OR_DEPARTMENT_OTHER): Payer: BC Managed Care – PPO

## 2011-10-29 ENCOUNTER — Encounter: Payer: Self-pay | Admitting: Oncology

## 2011-10-29 VITALS — BP 127/82 | HR 98 | Temp 99.0°F | Resp 18 | Ht 70.0 in | Wt 241.1 lb

## 2011-10-29 DIAGNOSIS — C8190 Hodgkin lymphoma, unspecified, unspecified site: Secondary | ICD-10-CM

## 2011-10-29 DIAGNOSIS — Z5111 Encounter for antineoplastic chemotherapy: Secondary | ICD-10-CM

## 2011-10-29 DIAGNOSIS — R109 Unspecified abdominal pain: Secondary | ICD-10-CM

## 2011-10-29 DIAGNOSIS — E049 Nontoxic goiter, unspecified: Secondary | ICD-10-CM

## 2011-10-29 DIAGNOSIS — E041 Nontoxic single thyroid nodule: Secondary | ICD-10-CM

## 2011-10-29 LAB — COMPREHENSIVE METABOLIC PANEL
AST: 16 U/L (ref 0–37)
Albumin: 3.9 g/dL (ref 3.5–5.2)
Alkaline Phosphatase: 93 U/L (ref 39–117)
Calcium: 9.2 mg/dL (ref 8.4–10.5)
Chloride: 108 mEq/L (ref 96–112)
Glucose, Bld: 102 mg/dL — ABNORMAL HIGH (ref 70–99)
Potassium: 4.3 mEq/L (ref 3.5–5.3)
Sodium: 142 mEq/L (ref 135–145)
Total Protein: 6.2 g/dL (ref 6.0–8.3)

## 2011-10-29 LAB — CBC WITH DIFFERENTIAL/PLATELET
Basophils Absolute: 0.1 10*3/uL (ref 0.0–0.1)
Eosinophils Absolute: 0.3 10*3/uL (ref 0.0–0.5)
MCV: 84.3 fL (ref 79.3–98.0)
MONO%: 8.6 % (ref 0.0–14.0)
RBC: 4.85 10*6/uL (ref 4.20–5.82)
RDW: 17.2 % — ABNORMAL HIGH (ref 11.0–14.6)
lymph#: 1.7 10*3/uL (ref 0.9–3.3)

## 2011-10-29 MED ORDER — SODIUM CHLORIDE 0.9 % IJ SOLN
10.0000 mL | INTRAMUSCULAR | Status: DC | PRN
  Administered 2011-10-29: 10 mL
  Filled 2011-10-29: qty 10

## 2011-10-29 MED ORDER — SODIUM CHLORIDE 0.9 % IV SOLN
375.0000 mg/m2 | Freq: Once | INTRAVENOUS | Status: AC
  Administered 2011-10-29: 840 mg via INTRAVENOUS
  Filled 2011-10-29: qty 42

## 2011-10-29 MED ORDER — SODIUM CHLORIDE 0.9 % IV SOLN
Freq: Once | INTRAVENOUS | Status: AC
  Administered 2011-10-29: 14:00:00 via INTRAVENOUS

## 2011-10-29 MED ORDER — SODIUM CHLORIDE 0.9 % IV SOLN
10.0000 [IU]/m2 | Freq: Once | INTRAVENOUS | Status: AC
  Administered 2011-10-29: 23 [IU] via INTRAVENOUS
  Filled 2011-10-29: qty 7.7

## 2011-10-29 MED ORDER — DEXAMETHASONE SODIUM PHOSPHATE 4 MG/ML IJ SOLN
20.0000 mg | Freq: Once | INTRAMUSCULAR | Status: AC
  Administered 2011-10-29: 20 mg via INTRAVENOUS

## 2011-10-29 MED ORDER — DOXORUBICIN HCL CHEMO IV INJECTION 2 MG/ML
25.0000 mg/m2 | Freq: Once | INTRAVENOUS | Status: AC
  Administered 2011-10-29: 56 mg via INTRAVENOUS
  Filled 2011-10-29: qty 28

## 2011-10-29 MED ORDER — HEPARIN SOD (PORK) LOCK FLUSH 100 UNIT/ML IV SOLN
500.0000 [IU] | Freq: Once | INTRAVENOUS | Status: AC | PRN
  Administered 2011-10-29: 500 [IU]
  Filled 2011-10-29: qty 5

## 2011-10-29 MED ORDER — ONDANSETRON 16 MG/50ML IVPB (CHCC)
16.0000 mg | Freq: Once | INTRAVENOUS | Status: AC
  Administered 2011-10-29: 16 mg via INTRAVENOUS

## 2011-10-29 NOTE — Progress Notes (Signed)
Brief Out-patient Oncology Nutrition Note  Reason: Patient Screened Positive For Nutrition Risk For Unintentional Weight Loss and Decreased Appetite.   Devon Brady is a 61 year old male patient of Dr. Gaylyn Rong, diagnosed with lymphoma. Attempted to contact  patient via telephone for positive nutrition risk. Left patient voicemail with RD contact information.   Wt Readings from Last 10 Encounters:  10/25/11 236 lb (107.049 kg)  10/15/11 242 lb 3.2 oz (109.861 kg)  10/01/11 244 lb 1.6 oz (110.723 kg)  09/17/11 240 lb 12.8 oz (109.226 kg)  09/03/11 240 lb (108.863 kg)  08/20/11 240 lb (108.863 kg)  08/06/11 233 lb 8 oz (105.915 kg)  07/23/11 232 lb 14.4 oz (105.643 kg)  07/09/11 229 lb 8 oz (104.101 kg)  06/25/11 228 lb 4.8 oz (103.556 kg)    RD available for nutrition needs.   Iven Finn, MS, RD, LDN [phone]

## 2011-10-29 NOTE — Progress Notes (Signed)
[facility]  Telephone:(336) [phone] Fax:(336) [phone]   OFFICE PROGRESS NOTE   Cc:  No primary provider on file.  DIAGNOSIS: stage IV Hodgkin's lymphoma (involvement of bone); IPS of 5 (alb <4; Hgb <10.5; Male, age >18; WBC >15K) or high risk.   CURRENT THERAPY: Started on 06/11/2011 with d1, d15, ABVD with Neulasta injection the day after. Plan is for minimal of 6 cycles total.   INTERVAL HISTORY: HOSEY BURMESTER 61 y.o. male returns for regular follow up with his wife.  He reported that he continued to have more shooting pain in his bilateral wrists (right worse than left).  Has difficulty tying his shoes and turning the key in the car to start it.  He still works full time.  He does not feel any palpable nodes.  He denies any back pain or need of pain meds.   Patient denies fever, anorexia, weight loss, fatigue, headache, visual changes, confusion, drenching night sweats, palpable lymph node swelling, mucositis, odynophagia, dysphagia, nausea vomiting, jaundice, chest pain, palpitation, shortness of breath, dyspnea on exertion, productive cough, gum bleeding, epistaxis, hematemesis, hemoptysis, abdominal pain, abdominal swelling, early satiety, melena, hematochezia, hematuria, skin rash, spontaneous bleeding, joint swelling, joint pain, heat or cold intolerance, bowel bladder incontinence, back pain, focal motor weakness, depression, suicidal or homicidal ideation, feeling hopelessness.     Past Medical History  Diagnosis Date  . Hypertension   . Hyperlipidemia   . CAD (coronary artery disease)     s/p 5V CABG 8/11  . RCA occlusion   . Multiple thyroid nodules   . Anemia   . Lymphocytosis   . Lymphadenopathy   . Hydronephrosis   . Renal insufficiency   . Complication of anesthesia     slow to wake up  . Dizziness     occasional for last week  . Hemorrhoid   . Sleep apnea     STOPBANG=5  . Hodgkin's lymphoma     Past Surgical History  Procedure Date   . Vasectomy 1980  . Bunionectomy 1978    right foot  . Cardiac catheterization 10/15/2009  . Coronary artery bypass graft 10/17/2009    LIMA to LAD,SVG to Ramus,SVG to OM sequential to OM2, SVG to marginal of RCA  . Tonsillectomy as child  . Portacath placement 06/10/2011    Procedure: INSERTION PORT-A-CATH;  Surgeon: Almond Lint, MD;  Location: [facility];  Service: General;  Laterality: Left;  subclavian     Current Outpatient Prescriptions  Medication Sig Dispense Refill  . aspirin 81 MG tablet Take 81 mg by mouth.       Marland Kitchen HYDROcodone-acetaminophen (NORCO) 5-325 MG per tablet 1 tablet Every 6 hours as needed.      . metoprolol succinate (TOPROL-XL) 50 MG 24 hr tablet Take 1 tablet (50 mg total) by mouth daily. Take with or immediately following a meal.  30 tablet  2  . nitroGLYCERIN (NITROSTAT) 0.4 MG SL tablet Place 1 tablet (0.4 mg total) under the tongue every 5 (five) minutes as needed for chest pain.  25 tablet  6  . NON FORMULARY CHEMO      . ondansetron (ZOFRAN) 8 MG tablet Take 1 tab two times a day starting the day after chemo for 3 days. Then take 1 tab two times a day as needed for nausea or vomiting.  30 tablet  1  . rosuvastatin (CRESTOR) 20 MG tablet Take one tablet by mouth every other day  15 tablet  11  .  DISCONTD: citalopram (CELEXA) 20 MG tablet Take 1 tablet (20 mg total) by mouth daily.  30 tablet  1  . DISCONTD: omeprazole (PRILOSEC OTC) 20 MG tablet Take 1 tablet (20 mg total) by mouth daily.  28 tablet  1   No current facility-administered medications for this visit.   Facility-Administered Medications Ordered in Other Visits  Medication Dose Route Frequency Provider Last Rate Last Dose  . 0.9 %  sodium chloride infusion   Intravenous Once Exie Parody, MD 20 mL/hr at 10/29/11 1400    . bleomycin (BLEOCIN) 23 Units in sodium chloride 0.9 % 50 mL chemo infusion  10 Units/m2 (Treatment Plan Actual) Intravenous Once Exie Parody, MD      . dacarbazine (DTIC) 840 mg in sodium  chloride 0.9 % 250 mL chemo infusion  375 mg/m2 (Treatment Plan Actual) Intravenous Once Exie Parody, MD      . dexamethasone (DECADRON) injection 20 mg  20 mg Intravenous Once Exie Parody, MD   20 mg at 10/29/11 1408  . DOXOrubicin (ADRIAMYCIN) chemo injection 56 mg  25 mg/m2 (Treatment Plan Actual) Intravenous Once Exie Parody, MD      . heparin lock flush 100 unit/mL  500 Units Intracatheter Once PRN Exie Parody, MD      . ondansetron (ZOFRAN) IVPB 16 mg  16 mg Intravenous Once Exie Parody, MD   16 mg at 10/29/11 1408  . sodium chloride 0.9 % injection 10 mL  10 mL Intracatheter PRN Exie Parody, MD        ALLERGIES:   has no known allergies.  REVIEW OF SYSTEMS:  The rest of the 14-point review of system was negative.   Filed Vitals:   10/29/11 1317  BP: 127/82  Pulse: 98  Temp: 99 F (37.2 C)  Resp: 18   Wt Readings from Last 3 Encounters:  10/29/11 241 lb 1.6 oz (109.362 kg)  10/25/11 236 lb (107.049 kg)  10/15/11 242 lb 3.2 oz (109.861 kg)   ECOG Performance status: 0  PHYSICAL EXAMINATION:   General:  well-nourished in no acute distress.  Eyes:  no scleral icterus.  ENT:  There were no oropharyngeal lesions.  Neck was without thyromegaly.  Lymphatics:  Negative cervical, supraclavicular or axillary adenopathy.  Respiratory: lungs were clear bilaterally without wheezing or crackles.  Cardiovascular:  Regular rate and rhythm, S1/S2, without murmur, rub or gallop.  There was no pedal edema.  GI:  abdomen was soft, flat, nontender, nondistended, without organomegaly.  Muscoloskeletal:  no spinal tenderness of palpation of vertebral spine.  Skin exam was without echymosis, petichae.  Neuro exam was nonfocal.  Patient was able to get on and off exam table without assistance.  Gait was normal.  Patient was alerted and oriented.  Attention was good.   Language was appropriate.  Mood was normal without depression.  Speech was not pressured.  Thought content was not tangential.    LABORATORY/RADIOLOGY  DATA:  Lab Results  Component Value Date   WBC 11.2* 10/29/2011   HGB 13.7 10/29/2011   HCT 40.9 10/29/2011   PLT 205 10/29/2011   GLUCOSE 118* 10/01/2011   CHOL 122 10/13/2010   TRIG 43.0 10/13/2010   HDL 29.10* 10/13/2010   LDLCALC 84 10/13/2010   ALKPHOS 95 10/01/2011   ALT <8 10/01/2011   AST 17 10/01/2011   NA 142 10/01/2011   K 4.0 10/01/2011   CL 108 10/01/2011   CREATININE 1.41* 10/01/2011  BUN 14 10/01/2011   CO2 23 10/01/2011   PSA 1.28 09/05/2009   INR 1.08 06/10/2011   HGBA1C 6.2 12/04/2009    ASSESSMENT AND PLAN:   1. Hodgkin's lymphoma, stage IV, high risk: He is s/p cycle#2 of chemo ABVD with clinical response. 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 Vinblastine was reduced by 50% 2 weeks ago. Will D/C Vinblastine at this time. He expressed informed understanding and wished to proceed.  2. Right flank pain. Pain is improved and he has not required any pain medications since hospital discharge.  3. Thyromegaly with thyroid nodule. Thyroid ultrasound performed on 06/10/2011 showed bilateral thyroid lobe and isthmic solid hypervascular thyroid nodules. The thyroid nodules met criteria for thyroid biopsy but the PET scan did not show any activity. Plan to pursue a thyroid biopsy by interventional radiology once chemotherapy is complete and if post treatment PET scan still shows concerning uptake.  4. Hydronephrosis. His new baseline Cr is now around 1.5.  5. Hypertension. Well controlled on metoprolol per PCP.  7. Hyperlipidemia. He'll continue on his Crestor per PCP.  8. Follow up: In 2 weeks for cycle #6, day 8 of ABVD.

## 2011-10-29 NOTE — Patient Instructions (Addendum)
[facility] Discharge Instructions for Patients Receiving Chemotherapy  Today you received the following chemotherapy agents Adriamycin/DTIC/Bleomycin To help prevent nausea and vomiting after your treatment, we encourage you to take your nausea medication as prescribed.  If you develop nausea and vomiting that is not controlled by your nausea medication, call the clinic. If it is after clinic hours your family physician or the after hours number for the clinic or go to the Emergency Department.   BELOW ARE SYMPTOMS THAT SHOULD BE REPORTED IMMEDIATELY:  *FEVER GREATER THAN 100.5 F  *CHILLS WITH OR WITHOUT FEVER  NAUSEA AND VOMITING THAT IS NOT CONTROLLED WITH YOUR NAUSEA MEDICATION  *UNUSUAL SHORTNESS OF BREATH  *UNUSUAL BRUISING OR BLEEDING  TENDERNESS IN MOUTH AND THROAT WITH OR WITHOUT PRESENCE OF ULCERS  *URINARY PROBLEMS  *BOWEL PROBLEMS  UNUSUAL RASH Items with * indicate a potential emergency and should be followed up as soon as possible.  One of the nurses will contact you 24 hours after your treatment. Please let the nurse know about any problems that you may have experienced. Feel free to call the clinic you have any questions or concerns. The clinic phone number is [phone].   I have been informed and understand all the instructions given to me. I know to contact the clinic, my physician, or go to the Emergency Department if any problems should occur. I do not have any questions at this time, but understand that I may call the clinic during office hours   should I have any questions or need assistance in obtaining follow up care.    __________________________________________  _____________  __________ Signature of Patient or Authorized Representative            Date                   Time    __________________________________________ Nurse's Signature

## 2011-10-30 ENCOUNTER — Ambulatory Visit (HOSPITAL_BASED_OUTPATIENT_CLINIC_OR_DEPARTMENT_OTHER): Payer: BC Managed Care – PPO

## 2011-10-30 ENCOUNTER — Ambulatory Visit: Payer: BC Managed Care – PPO

## 2011-10-30 VITALS — BP 154/84 | HR 99 | Temp 97.9°F | Resp 16

## 2011-10-30 DIAGNOSIS — C8190 Hodgkin lymphoma, unspecified, unspecified site: Secondary | ICD-10-CM

## 2011-10-30 DIAGNOSIS — Z5189 Encounter for other specified aftercare: Secondary | ICD-10-CM

## 2011-10-30 MED ORDER — PEGFILGRASTIM INJECTION 6 MG/0.6ML
6.0000 mg | Freq: Once | SUBCUTANEOUS | Status: AC
Start: 2011-10-30 — End: 2011-10-30
  Administered 2011-10-30: 6 mg via SUBCUTANEOUS

## 2011-11-01 ENCOUNTER — Encounter: Payer: Self-pay | Admitting: Dietician

## 2011-11-01 NOTE — Progress Notes (Signed)
Brief Out-patient Oncology Nutrition Note  Reason: Patient Screened Positive For Nutrition Risk For Unintentional Weight Loss and Decreased Appetite.   Patient returned RD phone call. Patient reported he eats well at most of his meals. He stated his appetite has been well. He reported his weight is up slightly. He asked a lot of good questions about nutrition. I encouraged him to drink an Ensure if he is unable to eat a meal. Answered all patient's questions. He verbalized understanding and was without any further nutrition related questions. Provided RD contact information for future questions or concerns.   RD available for nutrition needs.   Iven Finn, MS, RD, LDN [phone]

## 2011-11-04 ENCOUNTER — Other Ambulatory Visit: Payer: Self-pay | Admitting: Certified Registered Nurse Anesthetist

## 2011-11-12 ENCOUNTER — Ambulatory Visit (HOSPITAL_BASED_OUTPATIENT_CLINIC_OR_DEPARTMENT_OTHER): Payer: BC Managed Care – PPO

## 2011-11-12 ENCOUNTER — Encounter: Payer: Self-pay | Admitting: Oncology

## 2011-11-12 ENCOUNTER — Other Ambulatory Visit (HOSPITAL_BASED_OUTPATIENT_CLINIC_OR_DEPARTMENT_OTHER): Payer: BC Managed Care – PPO | Admitting: Lab

## 2011-11-12 ENCOUNTER — Ambulatory Visit (HOSPITAL_BASED_OUTPATIENT_CLINIC_OR_DEPARTMENT_OTHER): Payer: BC Managed Care – PPO | Admitting: Oncology

## 2011-11-12 ENCOUNTER — Telehealth: Payer: Self-pay | Admitting: Oncology

## 2011-11-12 VITALS — BP 119/82 | HR 83 | Temp 97.2°F | Resp 20 | Ht 70.0 in | Wt 240.8 lb

## 2011-11-12 DIAGNOSIS — Z5111 Encounter for antineoplastic chemotherapy: Secondary | ICD-10-CM

## 2011-11-12 DIAGNOSIS — E041 Nontoxic single thyroid nodule: Secondary | ICD-10-CM

## 2011-11-12 DIAGNOSIS — C8190 Hodgkin lymphoma, unspecified, unspecified site: Secondary | ICD-10-CM

## 2011-11-12 DIAGNOSIS — E049 Nontoxic goiter, unspecified: Secondary | ICD-10-CM

## 2011-11-12 DIAGNOSIS — G589 Mononeuropathy, unspecified: Secondary | ICD-10-CM

## 2011-11-12 LAB — CBC WITH DIFFERENTIAL/PLATELET
BASO%: 0.6 % (ref 0.0–2.0)
EOS%: 3.4 % (ref 0.0–7.0)
HGB: 13.9 g/dL (ref 13.0–17.1)
MCH: 28.8 pg (ref 27.2–33.4)
MCHC: 33.7 g/dL (ref 32.0–36.0)
RBC: 4.82 10*6/uL (ref 4.20–5.82)
RDW: 17.3 % — ABNORMAL HIGH (ref 11.0–14.6)
lymph#: 1.8 10*3/uL (ref 0.9–3.3)
nRBC: 0 % (ref 0–0)

## 2011-11-12 LAB — COMPREHENSIVE METABOLIC PANEL (CC13)
ALT: 8 U/L (ref 0–55)
AST: 15 U/L (ref 5–34)
Calcium: 9.2 mg/dL (ref 8.4–10.4)
Chloride: 108 mEq/L — ABNORMAL HIGH (ref 98–107)
Creatinine: 1.5 mg/dL — ABNORMAL HIGH (ref 0.7–1.3)
Potassium: 4.1 mEq/L (ref 3.5–5.1)
Sodium: 139 mEq/L (ref 136–145)

## 2011-11-12 MED ORDER — DEXAMETHASONE SODIUM PHOSPHATE 4 MG/ML IJ SOLN
20.0000 mg | Freq: Once | INTRAMUSCULAR | Status: AC
  Administered 2011-11-12: 20 mg via INTRAVENOUS

## 2011-11-12 MED ORDER — SODIUM CHLORIDE 0.9 % IJ SOLN
10.0000 mL | INTRAMUSCULAR | Status: DC | PRN
  Administered 2011-11-12: 10 mL
  Filled 2011-11-12: qty 10

## 2011-11-12 MED ORDER — HEPARIN SOD (PORK) LOCK FLUSH 100 UNIT/ML IV SOLN
500.0000 [IU] | Freq: Once | INTRAVENOUS | Status: AC | PRN
  Administered 2011-11-12: 500 [IU]
  Filled 2011-11-12: qty 5

## 2011-11-12 MED ORDER — SODIUM CHLORIDE 0.9 % IV SOLN
375.0000 mg/m2 | Freq: Once | INTRAVENOUS | Status: AC
  Administered 2011-11-12: 840 mg via INTRAVENOUS
  Filled 2011-11-12: qty 42

## 2011-11-12 MED ORDER — DOXORUBICIN HCL CHEMO IV INJECTION 2 MG/ML
25.0000 mg/m2 | Freq: Once | INTRAVENOUS | Status: AC
  Administered 2011-11-12: 56 mg via INTRAVENOUS
  Filled 2011-11-12: qty 28

## 2011-11-12 MED ORDER — ONDANSETRON 16 MG/50ML IVPB (CHCC)
16.0000 mg | Freq: Once | INTRAVENOUS | Status: AC
  Administered 2011-11-12: 16 mg via INTRAVENOUS

## 2011-11-12 MED ORDER — SODIUM CHLORIDE 0.9 % IV SOLN
10.0000 [IU]/m2 | Freq: Once | INTRAVENOUS | Status: AC
  Administered 2011-11-12: 23 [IU] via INTRAVENOUS
  Filled 2011-11-12: qty 7.7

## 2011-11-12 MED ORDER — SODIUM CHLORIDE 0.9 % IV SOLN
Freq: Once | INTRAVENOUS | Status: AC
  Administered 2011-11-12: 10:00:00 via INTRAVENOUS

## 2011-11-12 NOTE — Treatment Plan (Signed)
[facility] END OF TREATMENT   Name: PANAGIOTIS OELKERS Date: 11/12/2011 MRN: [id number] DOB: March 02, 1951   TREATMENT DATES: 06/11/2011 to 11/13/2011   REFERRING PHYSICIAN: Henri Medal, MD  DIAGNOSIS: Hodgkin's lymphoma (involvement of bone); IPS of 5 (alb <4; Hgb <10.5; Male, age >69; WBC >15K) or high risk.    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    PERFORMANCE STATUS AT END:  ECOG 0   ONGOING PROBLEMS: none   FOLLOW UP PLANS: restaging PET scan in about 2 weeks to access response and need for consolidative radiation.

## 2011-11-12 NOTE — Patient Instructions (Addendum)
1.  Hodgkin's lymphoma:  Last planned cycle of chemo ABVD today.  2.  Plan:  Repeat PET scan in about 2 weeks.     CONGRATULATIONS!

## 2011-11-12 NOTE — Patient Instructions (Signed)
[facility] Discharge Instructions for Patients Receiving Chemotherapy  Today you received the following chemotherapy agents: adriamycin, bleomycin, dacarbazine  To help prevent nausea and vomiting after your treatment, we encourage you to take your nausea medication.   Take it as often as prescribed.     If you develop nausea and vomiting that is not controlled by your nausea medication, call the clinic. If it is after clinic hours your family physician or the after hours number for the clinic or go to the Emergency Department.   BELOW ARE SYMPTOMS THAT SHOULD BE REPORTED IMMEDIATELY:  *FEVER GREATER THAN 100.5 F  *CHILLS WITH OR WITHOUT FEVER  NAUSEA AND VOMITING THAT IS NOT CONTROLLED WITH YOUR NAUSEA MEDICATION  *UNUSUAL SHORTNESS OF BREATH  *UNUSUAL BRUISING OR BLEEDING  TENDERNESS IN MOUTH AND THROAT WITH OR WITHOUT PRESENCE OF ULCERS  *URINARY PROBLEMS  *BOWEL PROBLEMS  UNUSUAL RASH Items with * indicate a potential emergency and should be followed up as soon as possible.  Feel free to call the clinic you have any questions or concerns. The clinic phone number is [phone].   I have been informed and understand all the instructions given to me. I know to contact the clinic, my physician, or go to the Emergency Department if any problems should occur. I do not have any questions at this time, but understand that I may call the clinic during office hours   should I have any questions or need assistance in obtaining follow up care.    __________________________________________  _____________  __________ Signature of Patient or Authorized Representative            Date                   Time    __________________________________________ Nurse's Signature

## 2011-11-12 NOTE — Telephone Encounter (Signed)
gve the pt his sept 2013 appt calendar along with the pet scan appt with instructions.

## 2011-11-12 NOTE — Progress Notes (Signed)
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   CURRENT THERAPY: Started on 06/11/2011 with d1, d15, ABVD with Neulasta injection the day after. Plan is for minimal of 6 cycles total.  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   INTERVAL HISTORY: Devon Brady 61 y.o. male returns for regular follow up with his wife for cycle#6, day #15 of chemo.  His neuropathy has significantly improved with being off of Vinblastine.  He still has some numbness in his toes and much less in his fingers.  He has no problem using utensils, typing or writing.  He is still working full time.   Patient denies fever, anorexia, weight loss, fatigue, headache, visual changes, confusion, drenching night sweats, palpable lymph node swelling, mucositis, odynophagia, dysphagia, nausea vomiting, jaundice, chest pain, palpitation, shortness of breath, dyspnea on exertion, productive cough, gum bleeding, epistaxis, hematemesis, hemoptysis, abdominal pain, abdominal swelling, early satiety, melena, hematochezia, hematuria, skin rash, spontaneous bleeding, joint swelling, joint pain, heat or cold intolerance, bowel bladder incontinence, back pain, focal motor weakness.    Past Medical History  Diagnosis Date  . Hypertension   . Hyperlipidemia   . CAD (coronary artery disease)     s/p 5V CABG 8/11  . RCA occlusion   . Multiple thyroid nodules   . Anemia   . Lymphocytosis   . Lymphadenopathy   . Hydronephrosis   . Renal insufficiency   . Complication of anesthesia     slow to wake up  . Dizziness     occasional for last week  . Hemorrhoid   . Sleep apnea     STOPBANG=5  . Hodgkin's lymphoma      Past Surgical History  Procedure Date  . Vasectomy 1980  . Bunionectomy 1978    right foot  . Cardiac catheterization 10/15/2009  . Coronary artery bypass graft 10/17/2009    LIMA to LAD,SVG to Ramus,SVG to OM sequential to OM2, SVG to marginal of RCA  . Tonsillectomy as child  . Portacath placement 06/10/2011    Procedure: INSERTION PORT-A-CATH;  Surgeon: Almond Lint, MD;  Location: [facility];  Service: General;  Laterality: Left;  subclavian     Current Outpatient Prescriptions  Medication Sig Dispense Refill  . aspirin 81 MG tablet Take 81 mg by mouth.       . loratadine (CLARITIN) 10 MG tablet Take 10 mg by mouth daily as needed.      . metoprolol succinate (TOPROL-XL) 50 MG 24 hr tablet Take 1 tablet (50 mg total) by mouth daily. Take with or immediately following a meal.  30 tablet  2  . NON FORMULARY CHEMO      . ondansetron (ZOFRAN) 8 MG tablet Take 1 tab two times a day starting the day after chemo for 3 days. Then take 1 tab two times a day as needed for nausea or vomiting.  30 tablet  1  . rosuvastatin (CRESTOR) 20 MG tablet Take one tablet by mouth every other day  15 tablet  11  . HYDROcodone-acetaminophen (NORCO) 5-325 MG per tablet 1 tablet Every 6 hours as needed.      . nitroGLYCERIN (NITROSTAT) 0.4 MG SL tablet Place  1 tablet (0.4 mg total) under the tongue every 5 (five) minutes as needed for chest pain.  25 tablet  6  . DISCONTD: citalopram (CELEXA) 20 MG tablet Take 1 tablet (20 mg total) by mouth daily.  30 tablet  1  . DISCONTD: omeprazole (PRILOSEC OTC) 20 MG tablet Take 1 tablet (20 mg total) by mouth daily.  28 tablet  1    ALLERGIES:   has no known allergies.  REVIEW OF SYSTEMS:  The rest of the 14-point review of system was negative.   Filed Vitals:   11/12/11 0834  BP: 119/82  Pulse: 83  Temp: 97.2 F (36.2 C)  Resp: 20   Wt Readings from Last 3 Encounters:  11/12/11 240 lb 12.8 oz (109.226 kg)  10/29/11 241 lb 1.6 oz (109.362 kg)  10/25/11 236  lb (107.049 kg)   ECOG Performance status: 0  PHYSICAL EXAMINATION:   General:  well-nourished man in no acute distress.  Eyes:  no scleral icterus.  ENT:  There were no oropharyngeal lesions.  Neck was without thyromegaly.  Lymphatics:  Negative cervical, supraclavicular or axillary adenopathy.  Respiratory: lungs were clear bilaterally without wheezing or crackles.  Cardiovascular:  Regular rate and rhythm, S1/S2, without murmur, rub.  There was a subtle gallop best heard in the right lower sternal border.  There was no elevated JVD.  There was no pedal edema.  GI:  abdomen was soft, flat, nontender, nondistended, without organomegaly.  Muscoloskeletal:  no spinal tenderness of palpation of vertebral spine.  Skin exam was without echymosis, petichae.  Neuro exam was nonfocal.  Patient was able to get on and off exam table without assistance.  Gait was normal.  Patient was alerted and oriented.  Attention was good.   Language was appropriate.  Mood was normal without depression.  Speech was not pressured.  Thought content was not tangential.      LABORATORY/RADIOLOGY DATA:  Lab Results  Component Value Date   WBC 9.3 11/12/2011   HGB 13.9 11/12/2011   HCT 41.3 11/12/2011   PLT 194 11/12/2011   GLUCOSE 102* 10/29/2011   CHOL 122 10/13/2010   TRIG 43.0 10/13/2010   HDL 29.10* 10/13/2010   LDLCALC 84 10/13/2010   ALKPHOS 93 10/29/2011   ALT 9 10/29/2011   AST 16 10/29/2011   NA 142 10/29/2011   K 4.3 10/29/2011   CL 108 10/29/2011   CREATININE 1.45* 10/29/2011   BUN 18 10/29/2011   CO2 29 10/29/2011   PSA 1.28 09/05/2009   INR 1.08 06/10/2011   HGBA1C 6.2 12/04/2009     ASSESSMENT AND PLAN:    1. Hodgkin's lymphoma, stage IV, high risk: After cle#2 of chemo ABVD, he had clinical response per PET scan.  He is here for last planned dose of chemo.  I again recommended to proceed with chemo today without further dose adjustment compared to the last dose.   2. Right flank pain. Pain is improved and he  has not required any pain medications since hospital discharge.   3. Thyromegaly with thyroid nodule. Thyroid ultrasound performed on 06/10/2011 showed bilateral thyroid lobe and isthmic solid hypervascular thyroid nodules. The thyroid nodules met criteria for thyroid biopsy but the PET scan did not show any activity. Plan to pursue a thyroid biopsy by interventional radiology once chemotherapy is complete and if post treatment PET scan still shows concerning uptake.   4. Hydronephrosis. His new baseline Cr is now around 1.5.   5. Hypertension. Well controlled  on metoprolol per PCP.   7. Hyperlipidemia. He'll continue on his Crestor per PCP.   8.  Neuropathy:  From chemo Vinblastin.  Now it is grade 1 being off of Vinblastine.   9. Follow up: I requested restaging PET scan for response to see if he needs consolidative radiation in about 2 weeks.  I will see him the day after the PET scan.

## 2011-11-13 ENCOUNTER — Ambulatory Visit (HOSPITAL_BASED_OUTPATIENT_CLINIC_OR_DEPARTMENT_OTHER): Payer: BC Managed Care – PPO

## 2011-11-13 VITALS — BP 131/86 | HR 97 | Temp 97.1°F | Resp 20

## 2011-11-13 DIAGNOSIS — Z5189 Encounter for other specified aftercare: Secondary | ICD-10-CM

## 2011-11-13 DIAGNOSIS — C8190 Hodgkin lymphoma, unspecified, unspecified site: Secondary | ICD-10-CM

## 2011-11-13 MED ORDER — PEGFILGRASTIM INJECTION 6 MG/0.6ML
6.0000 mg | Freq: Once | SUBCUTANEOUS | Status: AC
  Administered 2011-11-13: 6 mg via SUBCUTANEOUS

## 2011-11-25 ENCOUNTER — Encounter (HOSPITAL_COMMUNITY)
Admission: RE | Admit: 2011-11-25 | Discharge: 2011-11-25 | Disposition: A | Discharge status: Home / Self Care | Payer: BC Managed Care – PPO | Source: Ambulatory Visit | Attending: Oncology | Admitting: Oncology

## 2011-11-25 DIAGNOSIS — C8190 Hodgkin lymphoma, unspecified, unspecified site: Secondary | ICD-10-CM

## 2011-11-25 DIAGNOSIS — Z9221 Personal history of antineoplastic chemotherapy: Secondary | ICD-10-CM | POA: Insufficient documentation

## 2011-11-25 DIAGNOSIS — K4090 Unilateral inguinal hernia, without obstruction or gangrene, not specified as recurrent: Secondary | ICD-10-CM | POA: Insufficient documentation

## 2011-11-25 DIAGNOSIS — I517 Cardiomegaly: Secondary | ICD-10-CM | POA: Insufficient documentation

## 2011-11-25 MED ORDER — FLUDEOXYGLUCOSE F - 18 (FDG) INJECTION
16.3000 | Freq: Once | INTRAVENOUS | Status: AC | PRN
Start: 2011-11-25 — End: 2011-11-25
  Administered 2011-11-25: 16.3 via INTRAVENOUS

## 2011-11-25 NOTE — Patient Instructions (Addendum)
A.  Result of PET san 11/25/11:  Neck: No residual hypermetabolic adenopathy within the neck. Low  level hypermetabolism again corresponds to a diffusely enlarged  thyroid gland; nonspecific.  Chest: No abnormal hypermetabolism.  Abdomen/Pelvis: Left adrenal hypermetabolism without nodule or  mass. This is mild, and measures a S.U.V. max of 3.5. Similar to  on the prior exam.  Retroperitoneal nodes are again identified. Index left periaortic  node measures 1.2 cm and a S.U.V. max of 2.6 on image 167. On the  prior exam, this measured 1.5 cm and a S.U.V. max of 2.5 (when  remeasured).  No abnormal pelvic activity.  Skeleton: No skeletal hypermetabolism to suggest lymphomatous  involvement.  CT images performed for attenuation correction demonstrate no  cervical adenopathy.  A left-sided Port-A-Cath which terminates at the mid SVC.  Mild cardiomegaly with prior median sternotomy. Aortocaval  retroperitoneal node which measures 8 mm on image 164 versus 1.0 cm  on the prior exam.  Bilateral fat containing inguinal hernias. Mildly prominent  inguinal nodes which are not hypermetabolic. Surgical changes in  the right inguinal region.  There is lucency through the left transverse process of L3 on  image 171. This is suspicious for healing pathologic fracture. No  hypermetabolism in this area.   IMPRESSION:   1. Decrease in size of retroperitoneal nodes. Borderline low  level hypermetabolism remains. Most consistent with response to  therapy.  2. No new or progressive areas of lymphomatous involvement.  3. Resolution of cervical hypermetabolic nodal disease.  4. Nonspecific left adrenal hypermetabolism, without nodule or  mass. This is unchanged and can be reevaluated on follow-up.  5. Suspect healing pathologic fracture of the left transverse  process of L3. No residual hypermetabolism.   B.  Recommendations:  *  Evaluation by Radiation Oncology for consolidative radiation to the  abdominal mass.  *  I favor this over additional 2 cycles of chemo ABVD since toxicity of chemotherapy.   C.  Follow up:  CT in about 6 months, sooner if concerning symptoms.

## 2011-11-26 ENCOUNTER — Telehealth: Payer: Self-pay | Admitting: Oncology

## 2011-11-26 ENCOUNTER — Ambulatory Visit (HOSPITAL_BASED_OUTPATIENT_CLINIC_OR_DEPARTMENT_OTHER): Payer: BC Managed Care – PPO | Admitting: Oncology

## 2011-11-26 ENCOUNTER — Other Ambulatory Visit (HOSPITAL_BASED_OUTPATIENT_CLINIC_OR_DEPARTMENT_OTHER): Payer: BC Managed Care – PPO | Admitting: Lab

## 2011-11-26 VITALS — BP 129/92 | HR 88 | Temp 96.9°F | Resp 20 | Ht 70.0 in | Wt 245.7 lb

## 2011-11-26 DIAGNOSIS — C8190 Hodgkin lymphoma, unspecified, unspecified site: Secondary | ICD-10-CM

## 2011-11-26 DIAGNOSIS — D702 Other drug-induced agranulocytosis: Secondary | ICD-10-CM

## 2011-11-26 DIAGNOSIS — R220 Localized swelling, mass and lump, head: Secondary | ICD-10-CM

## 2011-11-26 DIAGNOSIS — E041 Nontoxic single thyroid nodule: Secondary | ICD-10-CM

## 2011-11-26 LAB — CBC WITH DIFFERENTIAL/PLATELET
Basophils Absolute: 0.1 10*3/uL (ref 0.0–0.1)
EOS%: 4.1 % (ref 0.0–7.0)
Eosinophils Absolute: 0.4 10*3/uL (ref 0.0–0.5)
HCT: 41.2 % (ref 38.4–49.9)
HGB: 13.6 g/dL (ref 13.0–17.1)
MCH: 29.8 pg (ref 27.2–33.4)
MCV: 90 fL (ref 79.3–98.0)
MONO%: 11.4 % (ref 0.0–14.0)
NEUT#: 6.2 10*3/uL (ref 1.5–6.5)
NEUT%: 69.2 % (ref 39.0–75.0)
Platelets: 172 10*3/uL (ref 140–400)

## 2011-11-26 LAB — COMPREHENSIVE METABOLIC PANEL (CC13)
AST: 16 U/L (ref 5–34)
Albumin: 3.6 g/dL (ref 3.5–5.0)
Alkaline Phosphatase: 104 U/L (ref 40–150)
BUN: 15 mg/dL (ref 7.0–26.0)
Calcium: 9.3 mg/dL (ref 8.4–10.4)
Creatinine: 1.6 mg/dL — ABNORMAL HIGH (ref 0.7–1.3)
Glucose: 99 mg/dl (ref 70–99)

## 2011-11-26 LAB — GLUCOSE, CAPILLARY: Glucose-Capillary: 100 mg/dL — ABNORMAL HIGH (ref 70–99)

## 2011-11-26 NOTE — Telephone Encounter (Signed)
lvm for pt regarding rad onc appt in sept.

## 2011-11-26 NOTE — Telephone Encounter (Signed)
Made and printed [place] appt in 2 weeks for sept, doc appt., and lab for dec. Radiation onc waiting for karen to return call..the patient aware.

## 2011-11-26 NOTE — Progress Notes (Signed)
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   PAST THERAPY: He received d1, d15, ABVD with Neulasta injection the day after. Plan is for minimal of 6 cycles total. 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  He was on chemo between 11/11/2011 and 11/13/2011.   CURRENT THERAPY:  Pending evaluation by Rad Onc for role of consolidative radiation.   INTERVAL HISTORY: Devon Brady 61 y.o. male returns for regular follow up with his wife and two children.  He reports doing relatively well.  He has mild fatigue; however, he is still able to work full time.  He has mild neuropathy in his fingers; however, this is much better compared to when he was on chemo.  He has no problem using utensils; writing or typing.  He has intermittent base of neck swelling.  He denied dysphagia, odynophagia, problem breathing.  He no longer has any back pain or requires any chronic pain meds.   Patient denies fever, anorexia, weight loss, headache, visual changes, confusion, drenching night sweats, palpable lymph node swelling, mucositis, odynophagia, dysphagia, nausea vomiting, jaundice, chest pain, palpitation, shortness of breath, dyspnea on exertion, productive cough, gum bleeding, epistaxis, hematemesis, hemoptysis, abdominal pain, abdominal swelling, early satiety, melena, hematochezia, hematuria, skin rash, spontaneous bleeding, joint swelling, joint pain, heat or cold intolerance, bowel bladder incontinence, back pain, focal motor weakness, paresthesia, depression, suicidal or homicidal ideation, feeling hopelessness.   Past Medical History  Diagnosis Date  . Hypertension   . Hyperlipidemia   .  CAD (coronary artery disease)     s/p 5V CABG 8/11  . RCA occlusion   . Multiple thyroid nodules   . Anemia   . Lymphocytosis   . Lymphadenopathy   . Hydronephrosis   . Renal insufficiency   . Complication of anesthesia     slow to wake up  . Dizziness     occasional for last week  . Hemorrhoid   . Sleep apnea     STOPBANG=5  . Hodgkin's lymphoma     Past Surgical History  Procedure Date  . Vasectomy 1980  . Bunionectomy 1978    right foot  . Cardiac catheterization 10/15/2009  . Coronary artery bypass graft 10/17/2009    LIMA to LAD,SVG to Ramus,SVG to OM sequential to OM2, SVG to marginal of RCA  . Tonsillectomy as child  . Portacath placement 06/10/2011    Procedure: INSERTION PORT-A-CATH;  Surgeon: Almond Lint, MD;  Location: [facility];  Service: General;  Laterality: Left;  subclavian     Current Outpatient Prescriptions  Medication Sig Dispense Refill  . aspirin 81 MG tablet Take 81 mg by mouth.       . loratadine (CLARITIN) 10 MG tablet Take 10 mg by mouth daily as needed.      . metoprolol succinate (TOPROL-XL) 50 MG 24 hr tablet Take 1 tablet (50 mg total) by mouth daily. Take with or immediately following a meal.  30 tablet  2  . rosuvastatin (CRESTOR) 20 MG tablet Take one tablet by mouth every other day  15 tablet  11  . nitroGLYCERIN (NITROSTAT) 0.4 MG SL tablet Place 1 tablet (0.4 mg total) under the tongue every  5 (five) minutes as needed for chest pain.  25 tablet  6  . NON FORMULARY CHEMO      . ondansetron (ZOFRAN) 8 MG tablet Take 1 tab two times a day starting the day after chemo for 3 days. Then take 1 tab two times a day as needed for nausea or vomiting.  30 tablet  1  . DISCONTD: citalopram (CELEXA) 20 MG tablet Take 1 tablet (20 mg total) by mouth daily.  30 tablet  1  . DISCONTD: omeprazole (PRILOSEC OTC) 20 MG tablet Take 1 tablet (20 mg total) by mouth daily.  28 tablet  1    ALLERGIES:   has no known allergies.  REVIEW OF SYSTEMS:  The rest of the  14-point review of system was negative.   Filed Vitals:   11/26/11 0939  BP: 129/92  Pulse: 88  Temp: 96.9 F (36.1 C)  Resp: 20   Wt Readings from Last 3 Encounters:  11/26/11 245 lb 11.2 oz (111.449 kg)  11/12/11 240 lb 12.8 oz (109.226 kg)  10/29/11 241 lb 1.6 oz (109.362 kg)   ECOG Performance status: 0-1  PHYSICAL EXAMINATION:   General:  well-nourished man, in no acute distress.  Eyes:  no scleral icterus.  ENT:  There were no oropharyngeal lesions.  Neck was without thyromegaly.  Lymphatics:  Negative cervical, supraclavicular or axillary adenopathy.  Respiratory: lungs were clear bilaterally without wheezing or crackles.  Cardiovascular:  Regular rate and rhythm, S1/S2, without murmur, rub or gallop.  There was no pedal edema.  GI:  abdomen was soft, flat, nontender, nondistended, without organomegaly.  Muscoloskeletal:  no spinal tenderness of palpation of vertebral spine.  Skin exam was without echymosis, petichae.  Neuro exam was nonfocal.  Patient was able to get on and off exam table without assistance.  Gait was normal.  Patient was alerted and oriented.  Attention was good.   Language was appropriate.  Mood was normal without depression.  Speech was not pressured.  Thought content was not tangential.    LABORATORY/RADIOLOGY DATA:  Lab Results  Component Value Date   WBC 8.9 11/26/2011   HGB 13.6 11/26/2011   HCT 41.2 11/26/2011   PLT 172 11/26/2011   GLUCOSE 99 11/26/2011   CHOL 122 10/13/2010   TRIG 43.0 10/13/2010   HDL 29.10* 10/13/2010   LDLCALC 84 10/13/2010   ALKPHOS 104 11/26/2011   ALT 9 11/26/2011   AST 16 11/26/2011   NA 142 11/26/2011   K 4.2 11/26/2011   CL 112* 11/26/2011   CREATININE 1.6* 11/26/2011   BUN 15.0 11/26/2011   CO2 21* 11/26/2011   PSA 1.28 09/05/2009   INR 1.08 06/10/2011   HGBA1C 6.2 12/04/2009   IMAGING:  I personally reviewed the PET scan and showed the patient and his relatives the images.   Nm Pet Image Restag (ps) Skull Base To  Thigh  11/25/2011  *RADIOLOGY REPORT*  Clinical Data: Subsequent treatment strategy for Hodgkin's lymphoma, status post chemotherapy.  NUCLEAR MEDICINE PET SKULL BASE TO THIGH  Fasting Blood Glucose:  100  Technique:  16.3 mCi F-18 FDG was injected intravenously. CT data was obtained and used for attenuation correction and anatomic localization only.  (This was not acquired as a diagnostic CT examination.) Additional exam technical data entered on technologist worksheet.  Comparison:  08/05/2011  Findings:  Neck: No residual hypermetabolic adenopathy within the neck.  Low level hypermetabolism again corresponds to  a diffusely enlarged thyroid gland; nonspecific.  Chest:  No abnormal hypermetabolism.  Abdomen/Pelvis:  Left adrenal hypermetabolism without nodule or mass.  This is mild, and measures a S.U.V. max of 3.5.  Similar to on the prior exam.  Retroperitoneal nodes are again identified.  Index left periaortic node measures 1.2 cm and a S.U.V. max of 2.6 on image 167.  On the prior exam, this measured 1.5 cm and a S.U.V. max of 2.5 (when remeasured).  No abnormal pelvic activity.  Skeleton:  No skeletal hypermetabolism to suggest lymphomatous involvement.  CT  images performed for attenuation correction demonstrate no cervical adenopathy.  A left-sided Port-A-Cath which terminates at the mid SVC.  Mild cardiomegaly with prior median sternotomy.  Aortocaval retroperitoneal node which measures 8 mm on image 164 versus 1.0 cm on the prior exam.  Bilateral fat containing inguinal hernias.  Mildly prominent inguinal nodes which are not hypermetabolic.  Surgical changes in the right inguinal region.   There is lucency through the left transverse process of L3 on image 171.  This is suspicious for healing pathologic fracture.  No hypermetabolism in this area.  IMPRESSION:  1.  Decrease in size of retroperitoneal nodes.  Borderline low level hypermetabolism remains.  Most consistent with response to therapy. 2.  No  new or progressive areas of lymphomatous involvement. 3.  Resolution of cervical hypermetabolic nodal disease. 4.  Nonspecific left adrenal hypermetabolism, without nodule or mass.  This is unchanged and can be reevaluated on follow-up. 5.  Suspect healing pathologic fracture of the left transverse process of L3.  No residual hypermetabolism.   Original Report Authenticated By: Consuello Bossier, M.D.     ASSESSMENT AND PLAN:    1. Hodgkin's lymphoma, stage IV, high risk: After cle#6 of chemo ABVD, he had very good partial response per PET scan. He has some residual, slight FDG uptake in the retroperitoneal mass.  He does not have convincing evidence of disease elsewhere.  It is difficult to definitively say whether this is residual Hodgkin's disease or inflammatory changes. As he had grade 2 neuropathy and truly depends on his ability to write to work, I personally think that two additional cycles os ABVD would be over treat.  I recommended referral to Rad Onc to determine if he would benefit from consolidative radiation to the retroperitoneal mass.  2. Thyroid nodule:  Even PET scan was negative; he still has intermittent sensation of neck swelling.  I referred him to neck US and consideration of biopsy if there is a dominant thyroid nodule.  4. Hydronephrosis. His new baseline Cr is now around 1.5.  5. Hypertension. Well controlled on metoprolol per PCP.  7. Hyperlipidemia. He'll continue on his Crestor per PCP.  8. Neuropathy: From chemo Vinblastin. Now it is grade 1 being off of Vinblastine.  9.  Age-appropriate cancer screening:  His colonoscopy this year showed on benign polyp.  He is due for a repeat colonoscopy in 2018.  10.  Follow up:  In about 3 months with exam only.  CT chest/abd/pel in about 6 months but sooner if concerning symptoms.

## 2011-11-30 ENCOUNTER — Encounter: Payer: Self-pay | Admitting: Radiation Oncology

## 2011-12-01 ENCOUNTER — Encounter: Payer: Self-pay | Admitting: Radiation Oncology

## 2011-12-01 ENCOUNTER — Ambulatory Visit
Admission: RE | Admit: 2011-12-01 | Discharge: 2011-12-01 | Disposition: A | Discharge status: Home / Self Care | Payer: BC Managed Care – PPO | Source: Ambulatory Visit | Attending: Radiation Oncology | Admitting: Radiation Oncology

## 2011-12-01 VITALS — BP 120/73 | HR 96 | Temp 97.6°F | Wt 241.2 lb

## 2011-12-01 DIAGNOSIS — C8118 Nodular sclerosis classical Hodgkin lymphoma, lymph nodes of multiple sites: Secondary | ICD-10-CM | POA: Insufficient documentation

## 2011-12-01 DIAGNOSIS — C8190 Hodgkin lymphoma, unspecified, unspecified site: Secondary | ICD-10-CM

## 2011-12-01 HISTORY — DX: Other specified postprocedural states: Z98.890

## 2011-12-01 HISTORY — DX: Adverse effect of antineoplastic and immunosuppressive drugs, initial encounter: T45.1X5A

## 2011-12-01 HISTORY — DX: Drug-induced polyneuropathy: G62.0

## 2011-12-01 NOTE — Progress Notes (Signed)
Here for consult for a Retroperitoneal mass for his Hodgkins Lymphoma.  No voiced concerns.   Chemothrapy completed.  To have an U/S at [place] on 12/13/11 for Nodules on his thyroid.  States good appetite and he has gained weight since receiving chemotherapy.  Pre chemo weight was 220 Lb.

## 2011-12-01 NOTE — Addendum Note (Signed)
Encounter addended by: Delynn Flavin, RN on: 12/01/2011  6:11 PM<BR>     Documentation filed: Charges VN

## 2011-12-01 NOTE — Progress Notes (Signed)
Radiation Oncology         [phone] ________________________________  Initial outpatient Consultation  Name: Devon Brady MRN: [id number]  Date: 12/01/2011  DOB: 14-Dec-1950  WU:[id number],NWGNFAOZ I, MD  Exie Parody, MD   REFERRING PHYSICIAN: Exie Parody, MD  DIAGNOSIS: The encounter diagnosis was Hodgkin's lymphoma. (Stage IV, Classical Nodule Sclerosing)  HISTORY OF PRESENT ILLNESS::Devon Brady is a 61 y.o. male who  presented with a neck mass, axillary masses, and back pain earlier this year and was found on workup to have classical Hodgkin's lymphoma. CT scan of the chest abdomen and pelvis on 05/28/2011 demonstrated  thoracic and low cervical adenopathy, and abdominal and pelvic adenopathy consistent with lymphoma. I have looked at this scan and there was no "bulky" disease >10cm; the largest mass was at the porta hepatis which measured 5.0 cm in greatest dimension. Lymphoma  was demonstrated by a biopsy of a right inguinal lymph node on 06/07/2011. This was nodular sclerosing type lymphoma. Bone marrow biopsy on 06/11/2011 demonstrate no evidence of marrow involvement by Hodgkin's lymphoma. PET scan on 06/11/2011 demonstrated hypermetabolic activity in the cervical axillary upper abdominal retroperitoneal inguinal regions. There was possible hypermetabolic activity in the right scrotum. Osseous metastasis was appreciated in the L3 vertebral body and the right iliac bone.  He went on to receive 6 cycles of ABVD with Neulasta injections. The Vinblastine was removed from his sixth cycle due to grade 2-3 neuropathy. He had undergone 50% dose reduction of Vinblastine  in the fifth cycle.  PET scan after completion of chemotherapy on 11/12/2011 demonstrated good response to therapy. There appears to be complete to near-complete resolution of his adenopathy. There is a question of some low-level hypermetabolic activity (SUV 2.6)  in the retroperitoneal region with lymph nodes measuring  no larger than 1.2 cm. Adrenal gland on the left demonstrates an SUV of 3.5; no obvious mass. Medical oncology wanted my opinion as to whether consolidative radiotherapy was recommended.  The patient continues to work. He has gained weight with chemotherapy. He feels well. He is somewhat bothered from resolving peripheral neuropathy. I would estimate his ECoG performance status to be 1 at this time.  PREVIOUS RADIATION THERAPY: No  PAST MEDICAL HISTORY:  has a past medical history of Hypertension; Hyperlipidemia; CAD (coronary artery disease); RCA occlusion; Multiple thyroid nodules; Anemia; Lymphocytosis; Lymphadenopathy; Hydronephrosis; Renal insufficiency; Complication of anesthesia; Dizziness; Hemorrhoid; Sleep apnea; Hodgkin's lymphoma; Neck swelling; Cough; Status post chemotherapy ( 06/11/11 - 11/13/11); Retroperitoneal mass; Neuropathy due to chemotherapeutic drug; and H/O colonoscopy (2013).    PAST SURGICAL HISTORY: Past Surgical History  Procedure Date  . Vasectomy 1980  . Bunionectomy 1978    right foot  . Cardiac catheterization 10/15/2009  . Coronary artery bypass graft 10/17/2009    LIMA to LAD,SVG to Ramus,SVG to OM sequential to OM2, SVG to marginal of RCA  . Tonsillectomy as child  . Portacath placement 06/10/2011    Procedure: INSERTION PORT-A-CATH;  Surgeon: Almond Lint, MD;  Location: [facility];  Service: General;  Laterality: Left;  subclavian   . Lymph node biopsy 06/07/11    RIGHT INGUINAL NODE: CLASSICAL HODGKIN'S LYMPHOMA, NODULAR SCLEROSIS TYPE  . Bone marrow aspirate,biopsy, and clot 06/11/11    LEFT ILIAC CREAST    FAMILY HISTORY: family history includes Alcohol abuse in an unspecified family member; Arthritis in his mother; Coronary artery disease (age of onset:69) in his father; Diabetes in his father; Hyperlipidemia in an unspecified family member; Hypertension in his  mother and sisters; Kidney disease in his father; and Prostate cancer in his paternal uncle.  SOCIAL  HISTORY:  reports that he has never smoked. He has never used smokeless tobacco. He reports that he drinks about .6 ounces of alcohol per week. He reports that he does not use illicit drugs.  ALLERGIES: Review of patient's allergies indicates no known allergies.  MEDICATIONS:  Current Outpatient Prescriptions  Medication Sig Dispense Refill  . aspirin 81 MG tablet Take 81 mg by mouth.       . loratadine (CLARITIN) 10 MG tablet Take 10 mg by mouth daily as needed.      . metoprolol succinate (TOPROL-XL) 50 MG 24 hr tablet Take 1 tablet (50 mg total) by mouth daily. Take with or immediately following a meal.  30 tablet  2  . nitroGLYCERIN (NITROSTAT) 0.4 MG SL tablet Place 1 tablet (0.4 mg total) under the tongue every 5 (five) minutes as needed for chest pain.  25 tablet  6  . ondansetron (ZOFRAN) 8 MG tablet Take 1 tab two times a day starting the day after chemo for 3 days. Then take 1 tab two times a day as needed for nausea or vomiting.  30 tablet  1  . rosuvastatin (CRESTOR) 20 MG tablet Take one tablet by mouth every other day  15 tablet  11  . DISCONTD: citalopram (CELEXA) 20 MG tablet Take 1 tablet (20 mg total) by mouth daily.  30 tablet  1  . DISCONTD: omeprazole (PRILOSEC OTC) 20 MG tablet Take 1 tablet (20 mg total) by mouth daily.  28 tablet  1    REVIEW OF SYSTEMS:   Pertinent items are noted in HPI.   PHYSICAL EXAM:  Blood pressure 120/73, pulse 96 temperature 97.6 weight 241 pounds General: Alert and oriented, in no acute distress, appears younger than his stated age HEENT: Head is normocephalic. Pupils are equally round and reactive to light. Extraocular movements are intact. Oropharynx is clear. Neck: Neck is supple, no palpable cervical or supraclavicular lymphadenopathy. Heart: Regular in rate and rhythm with no murmurs, rubs, or gallops. Chest: Clear to auscultation bilaterally, with no rhonchi, wheezes, or rales. Abdomen: Soft, nontender, nondistended, with no rigidity  or guarding. Extremities: No cyanosis or edema. Lymphatics: No concerning lymphadenopathy in the cervical supraclavicular or axillary regions. Skin: No concerning lesions. Musculoskeletal: symmetric strength and muscle tone throughout. Neurologic: Cranial nerves II through XII are grossly intact. No obvious focalities. Speech is fluent. Coordination is intact. Psychiatric: Judgment and insight are intact. Affect is appropriate.   LABORATORY DATA:  Lab Results  Component Value Date   WBC 8.9 11/26/2011   HGB 13.6 11/26/2011   HCT 41.2 11/26/2011   MCV 90.0 11/26/2011   PLT 172 11/26/2011   Lab Results  Component Value Date   NA 142 11/26/2011   K 4.2 11/26/2011   CL 112* 11/26/2011   CO2 21* 11/26/2011   Lab Results  Component Value Date   ALT 9 11/26/2011   AST 16 11/26/2011   ALKPHOS 104 11/26/2011   BILITOT 0.40 11/26/2011      RADIOGRAPHY: Nm Pet Image Restag (ps) Skull Base To Thigh  11/25/2011  *RADIOLOGY REPORT*  Clinical Data: Subsequent treatment strategy for Hodgkin's lymphoma, status post chemotherapy.  NUCLEAR MEDICINE PET SKULL BASE TO THIGH  Fasting Blood Glucose:  100  Technique:  16.3 mCi F-18 FDG was injected intravenously. CT data was obtained and used for attenuation correction and anatomic localization only.  (This was  not acquired as a diagnostic CT examination.) Additional exam technical data entered on technologist worksheet.  Comparison:  08/05/2011  Findings:  Neck: No residual hypermetabolic adenopathy within the neck.  Low level hypermetabolism again corresponds to  a diffusely enlarged thyroid gland; nonspecific.  Chest:  No abnormal hypermetabolism.  Abdomen/Pelvis:  Left adrenal hypermetabolism without nodule or mass.  This is mild, and measures a S.U.V. max of 3.5.  Similar to on the prior exam.  Retroperitoneal nodes are again identified.  Index left periaortic node measures 1.2 cm and a S.U.V. max of 2.6 on image 167.  On the prior exam, this measured 1.5 cm and  a S.U.V. max of 2.5 (when remeasured).  No abnormal pelvic activity.  Skeleton:  No skeletal hypermetabolism to suggest lymphomatous involvement.  CT  images performed for attenuation correction demonstrate no cervical adenopathy.  A left-sided Port-A-Cath which terminates at the mid SVC.  Mild cardiomegaly with prior median sternotomy.  Aortocaval retroperitoneal node which measures 8 mm on image 164 versus 1.0 cm on the prior exam.  Bilateral fat containing inguinal hernias.  Mildly prominent inguinal nodes which are not hypermetabolic.  Surgical changes in the right inguinal region.   There is lucency through the left transverse process of L3 on image 171.  This is suspicious for healing pathologic fracture.  No hypermetabolism in this area.  IMPRESSION:  1.  Decrease in size of retroperitoneal nodes.  Borderline low level hypermetabolism remains.  Most consistent with response to therapy. 2.  No new or progressive areas of lymphomatous involvement. 3.  Resolution of cervical hypermetabolic nodal disease. 4.  Nonspecific left adrenal hypermetabolism, without nodule or mass.  This is unchanged and can be reevaluated on follow-up. 5.  Suspect healing pathologic fracture of the left transverse process of L3.  No residual hypermetabolism.   Original Report Authenticated By: Consuello Bossier, M.D.      IMPRESSION/PLAN: Is a delightful 61 year old gentleman with Stage IV Hodgkin's lymphoma. No bulky disease (none >10cm) at diagnosis. I spoke with him and his wife today about his most recent PET/CT scan. I am not convinced that he necessarily has residual disease in the retroperitoneum, and I explained that I would rather hold radiotherapy at this time.  I also explained that even in the setting of residual disease, radiotherapy would not realistically improve his overall survival. He understands that radiotherapy would be delivered for local control. I think that the prudent approach at this time is for the patient to  continue following up with medical oncology with scans as they feel is warranted. I'm happy to see the patient back if anything concerning on a scan raises the question for the utility of radiotherapy in the future.  Of note, the patient's scrotum was not completely imaged on his most recent PET/CT scan. There may be some residual hypermetabolic activity in the right scrotum. That being said, the incidence of testicular Hodgkin's lymphoma is quite rare. I would not recommend radiotherapy to the scrotal area at this time, either.  The patient and his wife were pleased with this plan and understand that I am happy to work him back in my schedule if needed at any time the future. __________________________________________   Lonie Peak, MD

## 2011-12-10 ENCOUNTER — Other Ambulatory Visit (HOSPITAL_COMMUNITY): Payer: BC Managed Care – PPO

## 2011-12-13 ENCOUNTER — Ambulatory Visit (INDEPENDENT_AMBULATORY_CARE_PROVIDER_SITE_OTHER): Payer: BC Managed Care – PPO | Admitting: Internal Medicine

## 2011-12-13 ENCOUNTER — Ambulatory Visit (HOSPITAL_COMMUNITY)
Admission: RE | Admit: 2011-12-13 | Discharge: 2011-12-13 | Disposition: A | Discharge status: Home / Self Care | Payer: BC Managed Care – PPO | Source: Ambulatory Visit | Attending: Oncology | Admitting: Oncology

## 2011-12-13 ENCOUNTER — Encounter: Payer: Self-pay | Admitting: Internal Medicine

## 2011-12-13 ENCOUNTER — Other Ambulatory Visit (INDEPENDENT_AMBULATORY_CARE_PROVIDER_SITE_OTHER): Payer: BC Managed Care – PPO

## 2011-12-13 VITALS — BP 112/74 | HR 88 | Temp 98.2°F | Resp 16 | Ht 70.5 in | Wt 243.2 lb

## 2011-12-13 DIAGNOSIS — C8190 Hodgkin lymphoma, unspecified, unspecified site: Secondary | ICD-10-CM | POA: Insufficient documentation

## 2011-12-13 DIAGNOSIS — E041 Nontoxic single thyroid nodule: Secondary | ICD-10-CM

## 2011-12-13 DIAGNOSIS — Z0000 Encounter for general adult medical examination without abnormal findings: Secondary | ICD-10-CM

## 2011-12-13 DIAGNOSIS — Z23 Encounter for immunization: Secondary | ICD-10-CM

## 2011-12-13 DIAGNOSIS — E042 Nontoxic multinodular goiter: Secondary | ICD-10-CM | POA: Insufficient documentation

## 2011-12-13 LAB — LIPID PANEL
Cholesterol: 165 mg/dL (ref 0–200)
LDL Cholesterol: 108 mg/dL — ABNORMAL HIGH (ref 0–99)

## 2011-12-13 LAB — PSA: PSA: 0.73 ng/mL (ref 0.10–4.00)

## 2011-12-13 NOTE — Assessment & Plan Note (Signed)
Exam done, vaccines were reviewed and updated, labs ordered, pt ed material was given

## 2011-12-13 NOTE — Addendum Note (Signed)
Encounter addended by: Myrah Strawderman Mintz Davanna He, RN on: 12/13/2011  7:40 PM<BR>     Documentation filed: Charges VN

## 2011-12-13 NOTE — Addendum Note (Signed)
Encounter addended by: Daneil Beem Mintz Isobelle Tuckett, RN on: 12/13/2011  7:41 PM<BR>     Documentation filed: Charges VN

## 2011-12-13 NOTE — Patient Instructions (Addendum)
Health Maintenance, Males A healthy lifestyle and preventative care can promote health and wellness.  Maintain regular health, dental, and eye exams.   Eat a healthy diet. Foods like vegetables, fruits, whole grains, low-fat dairy products, and lean protein foods contain the nutrients you need without too many calories. Decrease your intake of foods high in solid fats, added sugars, and salt. Get information about a proper diet from your caregiver, if necessary.   Regular physical exercise is one of the most important things you can do for your health. Most adults should get at least 150 minutes of moderate-intensity exercise (any activity that increases your heart rate and causes you to sweat) each week. In addition, most adults need muscle-strengthening exercises on 2 or more days a week.    Maintain a healthy weight. The body mass index (BMI) is a screening tool to identify possible weight problems. It provides an estimate of body fat based on height and weight. Your caregiver can help determine your BMI, and can help you achieve or maintain a healthy weight. For adults 20 years and older:   A BMI below 18.5 is considered underweight.   A BMI of 18.5 to 24.9 is normal.   A BMI of 25 to 29.9 is considered overweight.   A BMI of 30 and above is considered obese.   Maintain normal blood lipids and cholesterol by exercising and minimizing your intake of saturated fat. Eat a balanced diet with plenty of fruits and vegetables. Blood tests for lipids and cholesterol should begin at age 20 and be repeated every 5 years. If your lipid or cholesterol levels are high, you are over 50, or you are a high risk for heart disease, you may need your cholesterol levels checked more frequently.Ongoing high lipid and cholesterol levels should be treated with medicines, if diet and exercise are not effective.   If you smoke, find out from your caregiver how to quit. If you do not use tobacco, do not start.    If you choose to drink alcohol, do not exceed 2 drinks per day. One drink is considered to be 12 ounces (355 mL) of beer, 5 ounces (148 mL) of wine, or 1.5 ounces (44 mL) of liquor.   Avoid use of street drugs. Do not share needles with anyone. Ask for help if you need support or instructions about stopping the use of drugs.   High blood pressure causes heart disease and increases the risk of stroke. Blood pressure should be checked at least every 1 to 2 years. Ongoing high blood pressure should be treated with medicines if weight loss and exercise are not effective.   If you are 45 to 60 years old, ask your caregiver if you should take aspirin to prevent heart disease.   Diabetes screening involves taking a blood sample to check your fasting blood sugar level. This should be done once every 3 years, after age 45, if you are within normal weight and without risk factors for diabetes. Testing should be considered at a younger age or be carried out more frequently if you are overweight and have at least 1 risk factor for diabetes.   Colorectal cancer can be detected and often prevented. Most routine colorectal cancer screening begins at the age of 50 and continues through age 75. However, your caregiver may recommend screening at an earlier age if you have risk factors for colon cancer. On a yearly basis, your caregiver may provide home test kits to check for hidden   blood in the stool. Use of a small camera at the end of a tube, to directly examine the colon (sigmoidoscopy or colonoscopy), can detect the earliest forms of colorectal cancer. Talk to your caregiver about this at age 50, when routine screening begins. Direct examination of the colon should be repeated every 5 to 10 years through age 75, unless early forms of pre-cancerous polyps or small growths are found.   Hepatitis C blood testing is recommended for all people born from 1945 through 1965 and any individual with known risks for  hepatitis C.   Healthy men should no longer receive prostate-specific antigen (PSA) blood tests as part of routine cancer screening. Consult with your caregiver about prostate cancer screening.   Testicular cancer screening is not recommended for adolescents or adult males who have no symptoms. Screening includes self-exam, caregiver exam, and other screening tests. Consult with your caregiver about any symptoms you have or any concerns you have about testicular cancer.   Practice safe sex. Use condoms and avoid high-risk sexual practices to reduce the spread of sexually transmitted infections (STIs).   Use sunscreen with a sun protection factor (SPF) of 30 or greater. Apply sunscreen liberally and repeatedly throughout the day. You should seek shade when your shadow is shorter than you. Protect yourself by wearing long sleeves, pants, a wide-brimmed hat, and sunglasses year round, whenever you are outdoors.   Notify your caregiver of new moles or changes in moles, especially if there is a change in shape or color. Also notify your caregiver if a mole is larger than the size of a pencil eraser.   A one-time screening for abdominal aortic aneurysm (AAA) and surgical repair of large AAAs by sound wave imaging (ultrasonography) is recommended for ages 65 to 75 years who are current or former smokers.   Stay current with your immunizations.  Document Released: 08/28/2007 Document Revised: 02/18/2011 Document Reviewed: 07/27/2010 [employer] Patient Information 2012 [employer]. 

## 2011-12-13 NOTE — Progress Notes (Signed)
  Subjective:    Patient ID: Devon Brady, male    DOB: February 22, 1951, 61 y.o.   MRN: [id number]  HPI  New to me for a physical - he has no complaints today.  Review of Systems  Constitutional: Negative.   HENT: Negative.   Eyes: Negative.   Respiratory: Negative.   Cardiovascular: Negative.   Gastrointestinal: Negative.   Genitourinary: Negative.   Musculoskeletal: Negative.   Skin: Negative.   Neurological: Negative.   Hematological: Negative.   Psychiatric/Behavioral: Negative.        Objective:   Physical Exam  Vitals reviewed. Constitutional: He is oriented to person, place, and time. He appears well-developed and well-nourished. No distress.  HENT:  Head: Normocephalic and atraumatic.  Mouth/Throat: Oropharynx is clear and moist. No oropharyngeal exudate.  Eyes: Conjunctivae normal are normal. Right eye exhibits no discharge. Left eye exhibits no discharge. No scleral icterus.  Neck: Normal range of motion. Neck supple. No JVD present. No tracheal deviation present. No thyromegaly present.  Cardiovascular: Normal rate, regular rhythm, normal heart sounds and intact distal pulses.  Exam reveals no gallop and no friction rub.   No murmur heard. Pulmonary/Chest: Effort normal and breath sounds normal. No stridor. No respiratory distress. He has no wheezes. He has no rales. He exhibits no tenderness.  Abdominal: Soft. Bowel sounds are normal. He exhibits no distension and no mass. There is no tenderness. There is no rebound and no guarding. Hernia confirmed negative in the right inguinal area and confirmed negative in the left inguinal area.  Genitourinary: Rectum normal, prostate normal, testes normal and penis normal. Rectal exam shows no external hemorrhoid, no internal hemorrhoid, no fissure, no mass, no tenderness and anal tone normal. Guaiac negative stool. Prostate is not enlarged and not tender. Right testis shows no mass, no swelling and no tenderness. Right testis is  descended. Left testis shows no mass, no swelling and no tenderness. Left testis is descended. Circumcised. No penile erythema or penile tenderness. No discharge found.  Musculoskeletal: Normal range of motion. He exhibits no edema and no tenderness.  Lymphadenopathy:    He has no cervical adenopathy.       Right: No inguinal adenopathy present.       Left: No inguinal adenopathy present.  Neurological: He is oriented to person, place, and time.  Skin: Skin is warm and dry. No rash noted. He is not diaphoretic. No erythema. No pallor.  Psychiatric: He has a normal mood and affect. His behavior is normal. Judgment and thought content normal.      Lab Results  Component Value Date   WBC 8.9 11/26/2011   HGB 13.6 11/26/2011   HCT 41.2 11/26/2011   PLT 172 11/26/2011   GLUCOSE 99 11/26/2011   CHOL 122 10/13/2010   TRIG 43.0 10/13/2010   HDL 29.10* 10/13/2010   LDLCALC 84 10/13/2010   ALT 9 11/26/2011   AST 16 11/26/2011   NA 142 11/26/2011   K 4.2 11/26/2011   CL 112* 11/26/2011   CREATININE 1.6* 11/26/2011   BUN 15.0 11/26/2011   CO2 21* 11/26/2011   TSH 0.16* 12/10/2009   PSA 1.28 09/05/2009   INR 1.08 06/10/2011   HGBA1C 6.2 12/04/2009      Assessment & Plan:

## 2011-12-14 ENCOUNTER — Encounter: Payer: Self-pay | Admitting: Internal Medicine

## 2011-12-17 ENCOUNTER — Other Ambulatory Visit: Payer: Self-pay | Admitting: Family Medicine

## 2011-12-20 NOTE — Telephone Encounter (Signed)
Patient has Est with Dr.Jones.. will forward the request to  PCP.     KP

## 2011-12-24 ENCOUNTER — Ambulatory Visit: Payer: BC Managed Care – PPO

## 2011-12-24 VITALS — BP 119/73 | HR 76 | Temp 97.7°F

## 2011-12-24 DIAGNOSIS — C8190 Hodgkin lymphoma, unspecified, unspecified site: Secondary | ICD-10-CM

## 2011-12-24 MED ORDER — SODIUM CHLORIDE 0.9 % IJ SOLN
10.0000 mL | INTRAMUSCULAR | Status: DC | PRN
  Administered 2011-12-24: 10 mL via INTRAVENOUS
  Filled 2011-12-24: qty 10

## 2011-12-24 MED ORDER — HEPARIN SOD (PORK) LOCK FLUSH 100 UNIT/ML IV SOLN
500.0000 [IU] | Freq: Once | INTRAVENOUS | Status: AC
  Administered 2011-12-24: 500 [IU] via INTRAVENOUS
  Filled 2011-12-24: qty 5

## 2011-12-24 NOTE — Patient Instructions (Signed)
Call MD with any questions 

## 2012-01-17 ENCOUNTER — Other Ambulatory Visit (INDEPENDENT_AMBULATORY_CARE_PROVIDER_SITE_OTHER): Payer: BC Managed Care – PPO

## 2012-01-17 DIAGNOSIS — I2510 Atherosclerotic heart disease of native coronary artery without angina pectoris: Secondary | ICD-10-CM

## 2012-01-17 LAB — HEPATIC FUNCTION PANEL
AST: 24 U/L (ref 0–37)
Albumin: 3.9 g/dL (ref 3.5–5.2)

## 2012-01-17 LAB — LIPID PANEL
HDL: 39.6 mg/dL (ref >39.00)
Total CHOL/HDL Ratio: 4
Triglycerides: 89 mg/dL (ref 0.0–149.0)
VLDL: 17.8 mg/dL (ref 0.0–40.0)

## 2012-01-21 ENCOUNTER — Other Ambulatory Visit: Payer: Self-pay | Admitting: *Deleted

## 2012-01-21 DIAGNOSIS — I2510 Atherosclerotic heart disease of native coronary artery without angina pectoris: Secondary | ICD-10-CM

## 2012-01-21 MED ORDER — ROSUVASTATIN CALCIUM 20 MG PO TABS: 20.0000 mg | ORAL_TABLET | Freq: Every day | ORAL | Status: DC

## 2012-01-24 ENCOUNTER — Ambulatory Visit (INDEPENDENT_AMBULATORY_CARE_PROVIDER_SITE_OTHER): Payer: BC Managed Care – PPO | Admitting: Internal Medicine

## 2012-01-24 ENCOUNTER — Other Ambulatory Visit: Payer: Self-pay | Admitting: Internal Medicine

## 2012-01-24 ENCOUNTER — Encounter: Payer: Self-pay | Admitting: Internal Medicine

## 2012-01-24 VITALS — BP 102/64 | HR 89 | Temp 97.7°F | Resp 16 | Wt 244.5 lb

## 2012-01-24 DIAGNOSIS — J329 Chronic sinusitis, unspecified: Secondary | ICD-10-CM

## 2012-01-24 MED ORDER — FLUTICASONE PROPIONATE 50 MCG/ACT NA SUSP: 2.0000 | Freq: Every day | NASAL | Status: DC

## 2012-01-24 NOTE — Progress Notes (Signed)
HPI  Pt presents to the clinic today with c/o sinus pain and nasal congestion that started on Saturday 01/22/2012. Pt classifies the symptoms as mild. He did go home on Sunday an slept most of the day. When he woke up, he felt better. He now feels like he is constantly blowing his nose, and green phlegm is coming out. He has been taking his Claritin which has helped a little. He is trying to stay hydrated but has no appetite. He is unsure what he can take to relieve the symptoms because he just fiished chemo for Hodgkins Lymphoma 9 weeks ago, but just resting has helped. He denies fever, chills body aches. He has not had any sick contacts that he knows of.  Review of Systems    Past Medical History  Diagnosis Date  . Hypertension   . Hyperlipidemia   . CAD (coronary artery disease)     s/p 5V CABG 8/11  . RCA occlusion   . Multiple thyroid nodules   . Anemia   . Lymphocytosis   . Lymphadenopathy   . Hydronephrosis   . Renal insufficiency   . Complication of anesthesia     slow to wake up  . Dizziness     occasional for last week  . Hemorrhoid   . Sleep apnea     STOPBANG=5  . Hodgkin's lymphoma   . Neck swelling     PRIOR TO DIAGNOSIS  . Cough     PRIOR TO DIAGNOSIS  . Status post chemotherapy  06/11/11 - 11/13/11    ABVD WITH NEULASTA SUPPORT, VINBLASTIN REMOVED STARTING 6TH CYCLE DUE TO GRADE 2-3 NEUROPATHY  . Retroperitoneal mass   . 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 H/O colonoscopy 2013    Family History  Problem Relation Age of Onset  . Alcohol abuse    . Arthritis Mother   . Diabetes Father   . Coronary artery disease Father 76    dscd  . Kidney disease Father   . Prostate cancer Paternal Uncle     Great  . Hypertension Mother   . Hyperlipidemia    . Hypertension Sister   . Hypertension Sister     2nd Sister    History   Social History  . Marital Status: Married    Spouse Name: N/A    Number of Children: 2  . Years of Education:  N/A   Occupational History  . 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    Social History Main Topics  . Smoking status: Never Smoker   . Smokeless tobacco: Never Used  . Alcohol Use: 0.6 oz/week    1 Cans of beer per week     Comment: occasional  . Drug Use: No  . Sexually Active: Not Currently   Other Topics Concern  . Not on file   Social History Narrative  . No narrative on file    No Known Allergies   Constitutional: Denies headache, fatigue and fever, abrupt weight changes.  HEENT:  Positive eye pain, pressure behind the eyes, facial pain, nasal congestion and sore throat. Denies eye redness, ear pain, ringing in the ears, wax buildup, runny nose or bloody nose. Respiratory: Positive  thick green sputum production. Denies, cough, difficulty breathing or shortness of breath.  Cardiovascular: Denies chest pain, chest tightness, palpitations or swelling in the hands or feet.   No other specific complaints in a complete review of systems (except as listed  in HPI above).  Objective:    General: Appears his stated age, well developed, well nourished in NAD. HEENT: Head: normal shape and size; Eyes: sclera white, no icterus, conjunctiva pink, PERRLA and EOMs intact; Ears: Tm's gray and intact, normal light reflex; Nose: mucosa pink and moist, septum midline; Throat/Mouth: + PND. Teeth present, mucosa pink and moist, no exudate noted, no lesions or ulcerations noted.  Neck: Mild cervical lymphadenopathy. Neck supple, trachea midline.  Cardiovascular: Normal rate and rhythm. S1,S2 noted.  No murmur, rubs or gallops noted. No JVD or BLE edema. No carotid bruits noted. Pulmonary/Chest: Normal effort with bilateral expiratory wheezes. No respiratory distress. No  rales or ronchi noted.      Assessment & Plan:   Acute sinusitis- most likely viral  Get plenty of rest and stay well hydrated. Can use a Neti Pot which can be purchased from your local drug store. Flonase 2  sprays each nostril for 3 days and then as needed. If not any better by Wednesday, call back and will send in eRx for Augmentin BID for 10 days  RTC as needed or if symptoms persist.

## 2012-01-24 NOTE — Patient Instructions (Addendum)

## 2012-02-04 ENCOUNTER — Ambulatory Visit (HOSPITAL_BASED_OUTPATIENT_CLINIC_OR_DEPARTMENT_OTHER): Payer: BC Managed Care – PPO

## 2012-02-04 VITALS — BP 123/83 | HR 79 | Temp 97.7°F

## 2012-02-04 DIAGNOSIS — Z452 Encounter for adjustment and management of vascular access device: Secondary | ICD-10-CM

## 2012-02-04 DIAGNOSIS — C8190 Hodgkin lymphoma, unspecified, unspecified site: Secondary | ICD-10-CM

## 2012-02-04 MED ORDER — HEPARIN SOD (PORK) LOCK FLUSH 100 UNIT/ML IV SOLN
500.0000 [IU] | Freq: Once | INTRAVENOUS | Status: AC
  Administered 2012-02-04: 500 [IU] via INTRAVENOUS
  Filled 2012-02-04: qty 5

## 2012-02-04 MED ORDER — SODIUM CHLORIDE 0.9 % IJ SOLN
10.0000 mL | INTRAMUSCULAR | Status: DC | PRN
  Administered 2012-02-04: 10 mL via INTRAVENOUS
  Filled 2012-02-04: qty 10

## 2012-02-14 ENCOUNTER — Other Ambulatory Visit (HOSPITAL_BASED_OUTPATIENT_CLINIC_OR_DEPARTMENT_OTHER): Payer: BC Managed Care – PPO

## 2012-02-14 ENCOUNTER — Ambulatory Visit (HOSPITAL_BASED_OUTPATIENT_CLINIC_OR_DEPARTMENT_OTHER): Payer: BC Managed Care – PPO | Admitting: Oncology

## 2012-02-14 ENCOUNTER — Telehealth: Payer: Self-pay | Admitting: Oncology

## 2012-02-14 ENCOUNTER — Encounter: Payer: Self-pay | Admitting: Oncology

## 2012-02-14 VITALS — BP 134/78 | HR 88 | Resp 20 | Ht 70.0 in | Wt 248.2 lb

## 2012-02-14 DIAGNOSIS — E041 Nontoxic single thyroid nodule: Secondary | ICD-10-CM

## 2012-02-14 DIAGNOSIS — E042 Nontoxic multinodular goiter: Secondary | ICD-10-CM

## 2012-02-14 DIAGNOSIS — N1330 Unspecified hydronephrosis: Secondary | ICD-10-CM

## 2012-02-14 DIAGNOSIS — C8190 Hodgkin lymphoma, unspecified, unspecified site: Secondary | ICD-10-CM

## 2012-02-14 DIAGNOSIS — G620 Drug-induced polyneuropathy: Secondary | ICD-10-CM

## 2012-02-14 LAB — CBC WITH DIFFERENTIAL/PLATELET
Basophils Absolute: 0.1 10*3/uL (ref 0.0–0.1)
EOS%: 4.4 % (ref 0.0–7.0)
HCT: 48 % (ref 38.4–49.9)
HGB: 15.7 g/dL (ref 13.0–17.1)
MONO#: 0.5 10*3/uL (ref 0.1–0.9)
NEUT#: 3.5 10*3/uL (ref 1.5–6.5)
NEUT%: 54 % (ref 39.0–75.0)
RDW: 14.6 % (ref 11.0–14.6)
WBC: 6.4 10*3/uL (ref 4.0–10.3)
lymph#: 2.1 10*3/uL (ref 0.9–3.3)

## 2012-02-14 LAB — COMPREHENSIVE METABOLIC PANEL (CC13)
ALT: 18 U/L (ref 0–55)
AST: 22 U/L (ref 5–34)
Albumin: 3.9 g/dL (ref 3.5–5.0)
BUN: 16 mg/dL (ref 7.0–26.0)
CO2: 28 mEq/L (ref 22–29)
Calcium: 9.8 mg/dL (ref 8.4–10.4)
Chloride: 109 mEq/L — ABNORMAL HIGH (ref 98–107)
Creatinine: 1.8 mg/dL — ABNORMAL HIGH (ref 0.7–1.3)
Potassium: 4.3 mEq/L (ref 3.5–5.1)

## 2012-02-14 NOTE — Progress Notes (Signed)
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   PAST THERAPY: He received d1, d15, ABVD with Neulasta injection the day after. Plan is for minimal of 6 cycles total. 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  He was on chemo between 11/11/2011 and 11/13/2011.   CURRENT THERAPY:  Watchful observation   INTERVAL HISTORY: Devon Brady 61 y.o. male returns for regular follow up by himself.  He reports doing relatively well.  He has mild fatigue; however, he is still able to work full time.  He has mild neuropathy in his fingers; however, this is much better compared to when he was on chemo.  He has no problem using utensils; writing or typing.  He has intermittent base of neck swelling.  He denied dysphagia, odynophagia, problem breathing.  He no longer has any back pain or requires any chronic pain meds.   Patient denies fever, anorexia, weight loss, headache, visual changes, confusion, drenching night sweats, palpable lymph node swelling, mucositis, odynophagia, dysphagia, nausea vomiting, jaundice, chest pain, palpitation, shortness of breath, dyspnea on exertion, productive cough, gum bleeding, epistaxis, hematemesis, hemoptysis, abdominal pain, abdominal swelling, early satiety, melena, hematochezia, hematuria, skin rash, spontaneous bleeding, joint swelling, joint pain, heat or cold intolerance, bowel bladder incontinence, back pain, focal motor weakness, paresthesia, depression, suicidal or homicidal ideation, feeling hopelessness.   Past Medical History  Diagnosis Date  . Hypertension   . Hyperlipidemia   . CAD (coronary artery disease)     s/p 5V CABG 8/11  . RCA occlusion     . Multiple thyroid nodules   . Anemia   . Lymphocytosis   . Lymphadenopathy   . Hydronephrosis   . Renal insufficiency   . Complication of anesthesia     slow to wake up  . Dizziness     occasional for last week  . Hemorrhoid   . Sleep apnea     STOPBANG=5  . Hodgkin's lymphoma   . Neck swelling     PRIOR TO DIAGNOSIS  . Cough     PRIOR TO DIAGNOSIS  . Status post chemotherapy  06/11/11 - 11/13/11    ABVD WITH NEULASTA SUPPORT, VINBLASTIN REMOVED STARTING 6TH CYCLE DUE TO GRADE 2-3 NEUROPATHY  . Retroperitoneal mass   . 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 H/O colonoscopy 2013    Past Surgical History  Procedure Date  . Vasectomy 1980  . Bunionectomy 1978    right foot  . Cardiac catheterization 10/15/2009  . Coronary artery bypass graft 10/17/2009    LIMA to LAD,SVG to Ramus,SVG to OM sequential to OM2, SVG to marginal of RCA  . Tonsillectomy as child  . Portacath placement 06/10/2011    Procedure: INSERTION PORT-A-CATH;  Surgeon: Almond Lint, MD;  Location: [facility];  Service: General;  Laterality: Left;  subclavian   . Lymph node biopsy 06/07/11    RIGHT INGUINAL NODE: CLASSICAL HODGKIN'S LYMPHOMA, NODULAR SCLEROSIS TYPE  . Bone marrow aspirate,biopsy, and clot 06/11/11    LEFT ILIAC CREAST    Current Outpatient Prescriptions  Medication Sig Dispense Refill  . aspirin 81 MG tablet Take 81 mg by mouth.       Marland Kitchen  fluticasone (FLONASE) 50 MCG/ACT nasal spray Place 2 sprays into the nose daily.  16 g  6  . loratadine (CLARITIN) 10 MG tablet Take 10 mg by mouth daily as needed.      . metoprolol succinate (TOPROL-XL) 50 MG 24 hr tablet TAKE 1 TABLET BY MOUTH DAILY TAKE WITH OR IMMEDIATELY FOLLOWING A MEAL  30 tablet  0  . nitroGLYCERIN (NITROSTAT) 0.4 MG SL tablet Place 1 tablet (0.4 mg total) under the tongue every 5 (five) minutes as needed for chest pain.  25 tablet  6  . ondansetron (ZOFRAN) 8 MG tablet Take 1 tab two times a day starting the day after  chemo for 3 days. Then take 1 tab two times a day as needed for nausea or vomiting.  30 tablet  1  . rosuvastatin (CRESTOR) 20 MG tablet Take 1 tablet (20 mg total) by mouth daily.  30 tablet  11  . [DISCONTINUED] citalopram (CELEXA) 20 MG tablet Take 1 tablet (20 mg total) by mouth daily.  30 tablet  1  . [DISCONTINUED] omeprazole (PRILOSEC OTC) 20 MG tablet Take 1 tablet (20 mg total) by mouth daily.  28 tablet  1    ALLERGIES:   has no known allergies.  REVIEW OF SYSTEMS:  The rest of the 14-point review of system was negative.   Filed Vitals:   02/14/12 1048  BP: 134/78  Pulse: 88  Resp: 20   Wt Readings from Last 3 Encounters:  02/14/12 248 lb 3.2 oz (112.583 kg)  01/24/12 244 lb 8 oz (110.904 kg)  12/13/11 243 lb 4 oz (110.337 kg)   ECOG Performance status: 0-1  PHYSICAL EXAMINATION:   General:  well-nourished man, in no acute distress.  Eyes:  no scleral icterus.  ENT:  There were no oropharyngeal lesions.  Neck was without thyromegaly.  Lymphatics:  Negative cervical, supraclavicular or axillary adenopathy.  Respiratory: lungs were clear bilaterally without wheezing or crackles.  Cardiovascular:  Regular rate and rhythm, S1/S2, without murmur, rub or gallop.  There was no pedal edema.  GI:  abdomen was soft, flat, nontender, nondistended, without organomegaly.  Muscoloskeletal:  no spinal tenderness of palpation of vertebral spine.  Skin exam was without echymosis, petichae.  Neuro exam was nonfocal.  Patient was able to get on and off exam table without assistance.  Gait was normal.  Patient was alerted and oriented.  Attention was good.   Language was appropriate.  Mood was normal without depression.  Speech was not pressured.  Thought content was not tangential.    LABORATORY/RADIOLOGY DATA:  Lab Results  Component Value Date   WBC 6.4 02/14/2012   HGB 15.7 02/14/2012   HCT 48.0 02/14/2012   PLT 194 02/14/2012   GLUCOSE 104* 02/14/2012   CHOL 157 01/17/2012   TRIG 89.0  01/17/2012   HDL 39.60 01/17/2012   LDLCALC 100* 01/17/2012   ALKPHOS 78 02/14/2012   ALT 18 02/14/2012   AST 22 02/14/2012   NA 146* 02/14/2012   K 4.3 02/14/2012   CL 109* 02/14/2012   CREATININE 1.8* 02/14/2012   BUN 16.0 02/14/2012   CO2 28 02/14/2012   PSA 0.73 12/13/2011   INR 1.08 06/10/2011   HGBA1C 6.2 12/04/2009   ASSESSMENT AND PLAN:    1. Hodgkin's lymphoma, stage IV, high risk: After cle#6 of chemo ABVD, he had very good partial response per PET scan. He has some residual, slight FDG uptake in the retroperitoneal mass.  He does not have  convincing evidence of disease elsewhere.  It is difficult to definitively say whether this is residual Hodgkin's disease or inflammatory changes. As he had grade 2 neuropathy and truly depends on his ability to write to work, I personally think that two additional cycles os ABVD would be over treat. He was evaluated by Rad Onc for consideration of consolidative radiation to the retroperitoneum. No radiation was recommended at that time. He will have a surveillance Ct scan in about 3 months. 2. Thyroid nodule:  Even PET scan was negative; he still has intermittent sensation of neck swelling.  U/S of thyroid in September showed mild enlargement of the thyroid. Biopsy recommended. I have referred him to Radiology for an ultrasound guided biopsy. 3. Hydronephrosis. His new baseline Cr is now around 1.5.  4. Hypertension. Well controlled on metoprolol per PCP.  5. Hyperlipidemia. He'll continue on his Crestor per PCP.  6. Neuropathy: From chemo Vinblastin. Now it is grade 1 being off of Vinblastine.  7.  Age-appropriate cancer screening:  His colonoscopy this year showed on benign polyp.  He is due for a repeat colonoscopy in 2018.  8.  Follow up:  In about 3 months with CT chest/abd/pel.

## 2012-02-14 NOTE — Telephone Encounter (Signed)
gv and pritned appt schedule for Pt for Jan, Feb, and March 2014...the patient aware that central scheduling will contact him with d.t of ct....gv pt Barium

## 2012-02-15 ENCOUNTER — Encounter (HOSPITAL_COMMUNITY): Payer: Self-pay | Admitting: Pharmacy Technician

## 2012-02-15 ENCOUNTER — Telehealth: Payer: Self-pay | Admitting: Oncology

## 2012-02-15 NOTE — Telephone Encounter (Signed)
Called and s/w jennifer in central scheduling and she stated that the u/s was currently in review...they are aware and will contact pt.

## 2012-02-18 ENCOUNTER — Ambulatory Visit (HOSPITAL_COMMUNITY)
Admission: RE | Admit: 2012-02-18 | Discharge: 2012-02-18 | Disposition: A | Discharge status: Home / Self Care | Payer: BC Managed Care – PPO | Source: Ambulatory Visit | Attending: Oncology | Admitting: Oncology

## 2012-02-18 DIAGNOSIS — Z87898 Personal history of other specified conditions: Secondary | ICD-10-CM | POA: Insufficient documentation

## 2012-02-18 DIAGNOSIS — E042 Nontoxic multinodular goiter: Secondary | ICD-10-CM | POA: Insufficient documentation

## 2012-02-18 NOTE — Procedures (Signed)
US guided FNA x4 isthmus and dominant left thyroid nodules. Pathology pending. No immediate complicatons. Medications utilized- 1% lidocaine to skin and SQ tissues.

## 2012-02-21 ENCOUNTER — Telehealth: Payer: Self-pay | Admitting: *Deleted

## 2012-02-21 NOTE — Telephone Encounter (Signed)
1.  Elevation of Cr by itself is not indication of recurrent lymphoma.  Belenda Cruise, within 10 days, please order a renal US, recheck Cr, and refer him to Nephrology if Cr continues to rise.   2.  Most likely that his bone pain is from osteoarthritis.  He should take Tylenol 325mg  PO q6h prn.  AVOID NSAIDS due to renal issue.  But, if his pain still persist after 2 weeks of this, please instruct him to call us back for further work up.    Thanks.

## 2012-02-21 NOTE — Telephone Encounter (Signed)
Pt called concerned about his increase in Creatinine from 1.5 to 1.8 on last visit.  He also reports return of some lower back and bilat hip soreness especially upon getting up first thing in the morning and getting up after sitting for a while.  He asks if his CT scan should be done sooner than scheduled in March?

## 2012-02-22 ENCOUNTER — Other Ambulatory Visit: Payer: Self-pay | Admitting: Oncology

## 2012-02-22 ENCOUNTER — Telehealth: Payer: Self-pay

## 2012-02-22 DIAGNOSIS — C8190 Hodgkin lymphoma, unspecified, unspecified site: Secondary | ICD-10-CM

## 2012-02-23 ENCOUNTER — Telehealth: Payer: Self-pay | Admitting: Oncology

## 2012-02-23 NOTE — Telephone Encounter (Signed)
s.w. pt and gv lab & u/s d/t.Marland KitchenMarland KitchenMarland KitchenMarland Kitchenpt aware

## 2012-02-28 ENCOUNTER — Encounter: Payer: Self-pay | Admitting: Oncology

## 2012-02-28 NOTE — Progress Notes (Signed)
I talked to pt today re: his thyroid biopsy.  Pathologist thought that it was mostly benign; however, cannot rule out low grade thyroid neoplasm.  I recommended patient to follow up with Dr. Serena Colonel for further evaluation and treatment as appropriate.

## 2012-02-29 ENCOUNTER — Ambulatory Visit (HOSPITAL_COMMUNITY): Payer: BC Managed Care – PPO

## 2012-03-01 ENCOUNTER — Other Ambulatory Visit (HOSPITAL_BASED_OUTPATIENT_CLINIC_OR_DEPARTMENT_OTHER): Payer: BC Managed Care – PPO | Admitting: Lab

## 2012-03-01 ENCOUNTER — Other Ambulatory Visit: Payer: Self-pay | Admitting: *Deleted

## 2012-03-01 ENCOUNTER — Ambulatory Visit (HOSPITAL_COMMUNITY)
Admission: RE | Admit: 2012-03-01 | Discharge: 2012-03-01 | Disposition: A | Discharge status: Home / Self Care | Payer: BC Managed Care – PPO | Source: Ambulatory Visit | Attending: Oncology | Admitting: Oncology

## 2012-03-01 ENCOUNTER — Telehealth: Payer: Self-pay | Admitting: Cardiovascular Disease

## 2012-03-01 DIAGNOSIS — C8190 Hodgkin lymphoma, unspecified, unspecified site: Secondary | ICD-10-CM

## 2012-03-01 DIAGNOSIS — R944 Abnormal results of kidney function studies: Secondary | ICD-10-CM | POA: Insufficient documentation

## 2012-03-01 LAB — BUN AND CREATININE (CC13)
BUN: 15 mg/dL (ref 7.0–26.0)
Creatinine: 1.5 mg/dL — ABNORMAL HIGH (ref 0.7–1.3)

## 2012-03-01 MED ORDER — METOPROLOL SUCCINATE ER 50 MG PO TB24: 50.0000 mg | ORAL_TABLET | Freq: Every day | ORAL | Status: DC

## 2012-03-01 NOTE — Telephone Encounter (Signed)
Called pt to find out what medication he needed filled and sent to [facility] on [address], [place].  Pt stated he needed his metoprolol succinate (TOPROL-XL)  MG 24 hr tablet refilled.  I asked him if he would like a 30 or 90 day supply, pt stated he would appreciate a 90 day supply sent in.  Rx refilled for 90 day supply with 3 refills.  Caralee Ates, CMA

## 2012-03-01 NOTE — Telephone Encounter (Signed)
Pt requested refill of Metoprolol rx. Rx sent to [facility], pt informed. Appointment also scheduled 03/14/2012 at 8:00am to discuss recent labs and other problems.

## 2012-03-01 NOTE — Telephone Encounter (Signed)
New problem   [facility] on [address]

## 2012-03-02 ENCOUNTER — Telehealth: Payer: Self-pay | Admitting: *Deleted

## 2012-03-02 NOTE — Telephone Encounter (Signed)
Called pt w/ Kristin's message that his renal u/s normal and Creatine better now.  Keep appts as scheduled. He verbalized understanding.

## 2012-03-02 NOTE — Telephone Encounter (Signed)
Message copied by Wende Mott on Thu Mar 02, 2012  4:53 PM ------      Message from: Clenton Pare R      Created: Thu Mar 02, 2012 10:41 AM       Please call pt. Renal u/s looks normal and Cr is better ( now 1.5). Keep appts as scheduled with [place].

## 2012-03-14 ENCOUNTER — Encounter: Payer: Self-pay | Admitting: Internal Medicine

## 2012-03-14 ENCOUNTER — Ambulatory Visit (INDEPENDENT_AMBULATORY_CARE_PROVIDER_SITE_OTHER): Payer: BC Managed Care – PPO | Admitting: Internal Medicine

## 2012-03-14 VITALS — BP 120/80 | HR 78 | Temp 97.5°F | Resp 16 | Wt 250.8 lb

## 2012-03-14 DIAGNOSIS — N529 Male erectile dysfunction, unspecified: Secondary | ICD-10-CM

## 2012-03-14 DIAGNOSIS — E042 Nontoxic multinodular goiter: Secondary | ICD-10-CM

## 2012-03-14 DIAGNOSIS — N289 Disorder of kidney and ureter, unspecified: Secondary | ICD-10-CM

## 2012-03-14 DIAGNOSIS — Z23 Encounter for immunization: Secondary | ICD-10-CM

## 2012-03-14 MED ORDER — TADALAFIL 20 MG PO TABS: 20.0000 mg | ORAL_TABLET | Freq: Every day | ORAL | Status: DC | PRN

## 2012-03-14 NOTE — Patient Instructions (Addendum)
Erectile Dysfunction  Erectile dysfunction (ED) is the inability to get a good enough erection to have sexual intercourse. ED may involve:  · Inability to get an erection.  · Lack of enough hardness to allow penetration.  · Loss of the erection before sex is finished.  · Premature ejaculation.  · Any combination of these problems if they occur more than 25% of the time.  CAUSES  · Certain drugs, such as:  · Pain relievers.  · Antihistamines.  · Antidepressants.  · Blood pressure medicines.  · Water pills.  · Ulcer medicines.  · Muscle relaxants.  · Illegal drugs.  · Excessive drinking.  · Psychological causes, such as:  · Anxiety.  · Depression.  · Sadness.  · Exhaustion.  · Performance fear.  · Stress.  · Physical causes, such as:  · Artery problems. This may include diabetes, smoking, liver disease, or atherosclerosis.  · High blood pressure.  · Hormonal problems, such as low testosterone.  · Obesity.  · Nerve problems. This may include back or pelvic injuries, diabetes, multiple sclerosis, Parkinson's disease, or some surgeries.  SYMPTOMS  · Inability to get an erection.  · Lack of enough hardness to allow penetration.  · Loss of the erection before sex is finished.  · Premature ejaculation.  · Normal erections at some times, but with frequent unsatisfactory episodes.  · Orgasms that are not satisfactory in sensation or frequency.  · Low sexual satisfaction in either partner because of erection problems.  · A curved penis occurring with erection. The curve may cause pain or may be too curved to allow for intercourse.  · Never having nighttime erections.  DIAGNOSIS  Your caregiver can often diagnose this condition by:  · Performing a physical exam to find other diseases or specific problems with the penis.  · Asking you detailed questions about the problem.  · Performing blood tests to check for diabetes or to measure hormone levels.  · Performing urine tests to find other underlying health  conditions.  · Performing an ultrasound to check for scarring.  · Performing a test to check blood flow to the penis.  · Doing a sleep study at home to measure nighttime erections.  TREATMENT   · You may be prescribed medicines by mouth.  · You may be given medicine injections into the penis.  · You may be prescribed a vacuum pump with a ring.  · Penile implant surgery may be performed. You may receive:  · An inflatable implant.  · A semi-rigid implant.  · Blood vessel surgery may be performed.  HOME CARE INSTRUCTIONS  · Take all medicine as directed by your caregiver. Do not take any other medicines without talking to your caregiver first.  · Follow your caregiver's directions for specific treatments as prescribed.  · Follow up with your caregiver as directed.  Document Released: 02/27/2000 Document Revised: 05/24/2011 Document Reviewed: 06/21/2010  Exit[employer]® Patient Information ©2013 [employer].

## 2012-03-14 NOTE — Assessment & Plan Note (Addendum)
Follicular type

## 2012-03-14 NOTE — Progress Notes (Signed)
Subjective:    Patient ID: Devon Brady, male    DOB: [birth year], 61 y.o.   MRN: [id number]  Erectile Dysfunction This is a chronic problem. The current episode started more than 1 year ago. The problem is unchanged. The nature of his difficulty is achieving erection, maintaining erection and penetration. He reports no anxiety, decreased libido or performance anxiety. He reports his erection duration to be 1 to 5 minutes. Irritative symptoms do not include frequency, nocturia or urgency. Obstructive symptoms do not include dribbling, incomplete emptying, an intermittent stream, a slower stream, straining or a weak stream. Pertinent negatives include no chills, dysuria, genital pain, hematuria, hesitancy or inability to urinate. Past treatments include tadalafil. The treatment provided moderate relief. He has been using treatment for 1 to 6 months. He has had no adverse reactions caused by medications. Risk factors include hypertension.      Review of Systems  Constitutional: Negative.  Negative for chills.  HENT: Positive for neck pain (enlarged T gland). Negative for sore throat, facial swelling, trouble swallowing, neck stiffness and voice change.   Eyes: Negative.   Respiratory: Negative.   Cardiovascular: Negative.   Gastrointestinal: Negative.  Negative for nausea, vomiting, abdominal pain, diarrhea and constipation.  Genitourinary: Negative.  Negative for dysuria, hesitancy, urgency, frequency, hematuria, flank pain, decreased libido, incomplete emptying and nocturia.  Musculoskeletal: Negative for myalgias, back pain, joint swelling, arthralgias and gait problem.  Skin: Negative.   Neurological: Negative.   Hematological: Negative for adenopathy. Does not bruise/bleed easily.  Psychiatric/Behavioral: Negative.        Objective:   Physical Exam  Vitals reviewed. Constitutional: He is oriented to person, place, and time. He appears well-developed and well-nourished. No  distress.  HENT:  Head: Normocephalic and atraumatic.  Mouth/Throat: Oropharynx is clear and moist. No oropharyngeal exudate.  Eyes: Conjunctivae normal are normal. Right eye exhibits no discharge. Left eye exhibits no discharge. No scleral icterus.  Neck: Normal range of motion. Neck supple. No JVD present. No tracheal deviation present. Thyromegaly present.  Cardiovascular: Normal rate, regular rhythm, normal heart sounds and intact distal pulses.  Exam reveals no gallop and no friction rub.   No murmur heard. Pulmonary/Chest: Effort normal and breath sounds normal. No stridor. No respiratory distress. He has no wheezes. He has no rales. He exhibits no tenderness.  Abdominal: Soft. Bowel sounds are normal. He exhibits no distension and no mass. There is no tenderness. There is no rebound and no guarding.  Musculoskeletal: Normal range of motion. He exhibits no edema and no tenderness.  Lymphadenopathy:    He has no cervical adenopathy.  Neurological: He is oriented to person, place, and time.  Skin: Skin is warm and dry. No rash noted. He is not diaphoretic. No erythema. No pallor.  Psychiatric: He has a normal mood and affect. His behavior is normal. Judgment and thought content normal.      Lab Results  Component Value Date   WBC 6.4 02/14/2012   HGB 15.7 02/14/2012   HCT 48.0 02/14/2012   PLT 194 02/14/2012   GLUCOSE 104* 02/14/2012   CHOL 157 01/17/2012   TRIG 89.0 01/17/2012   HDL 39.60 01/17/2012   LDLCALC 100* 01/17/2012   ALT 18 02/14/2012   AST 22 02/14/2012   NA 146* 02/14/2012   K 4.3 02/14/2012   CL 109* 02/14/2012   CREATININE 1.5* 03/01/2012   BUN 15.0 03/01/2012   CO2 28 02/14/2012   TSH 0.16* 12/10/2009   PSA 0.73 12/13/2011  INR 1.08 06/10/2011   HGBA1C 6.2 12/04/2009      Assessment & Plan:

## 2012-03-14 NOTE — Assessment & Plan Note (Signed)
He will try cialis He tells me that he does not use the NTG

## 2012-03-14 NOTE — Assessment & Plan Note (Signed)
This is stable 

## 2012-03-15 DIAGNOSIS — C73 Malignant neoplasm of thyroid gland: Secondary | ICD-10-CM

## 2012-03-15 HISTORY — DX: Malignant neoplasm of thyroid gland: C73

## 2012-03-16 ENCOUNTER — Encounter (HOSPITAL_COMMUNITY): Payer: Self-pay | Admitting: Pharmacy Technician

## 2012-03-17 ENCOUNTER — Ambulatory Visit: Payer: BC Managed Care – PPO

## 2012-03-17 VITALS — BP 127/76 | HR 78 | Temp 98.2°F | Resp 18

## 2012-03-17 DIAGNOSIS — C8190 Hodgkin lymphoma, unspecified, unspecified site: Secondary | ICD-10-CM

## 2012-03-17 MED ORDER — HEPARIN SOD (PORK) LOCK FLUSH 100 UNIT/ML IV SOLN
500.0000 [IU] | Freq: Once | INTRAVENOUS | Status: AC
  Administered 2012-03-17: 500 [IU] via INTRAVENOUS
  Filled 2012-03-17: qty 5

## 2012-03-17 MED ORDER — SODIUM CHLORIDE 0.9 % IJ SOLN
10.0000 mL | INTRAMUSCULAR | Status: DC | PRN
  Administered 2012-03-17: 10 mL via INTRAVENOUS
  Filled 2012-03-17: qty 10

## 2012-03-24 NOTE — H&P (Signed)
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  Lymphoma (202.80) (C85.90). Discussed  Given the findings on FNA, recommend total thyroidectomy. We discussed is about 25% chance this is cancerous. All the details of the surgery were discussed at great length. All questions were answered. Reason For Visit  Goiter. HPI  Recently treated for lymphoma. Found to have thyroid enlargement and recent FNA revealed follicular cells with micro-follicular pattern. There has also been increasing in size over the past couple of years. He is referred here for consideration of thyroidectomy. Allergies  No Known Drug Allergies. Current Meds  Nitrostat 0.4 MG Sublingual Tablet Sublingual;; RPT Aspirin Low Dose 81 MG Oral Tablet;TAKE 1 TABLET DAILY.; RPT Zofran 8 MG Oral Tablet (Ondansetron HCl);; RPT Crestor 20 MG Oral Tablet;TAKE 1 TABLET DAILY.; Rx Multi-Day Vitamins Oral Tablet;TAKE 1 TABLET DAILY.; RPT Fluticasone Propionate 50 MCG/ACT Nasal Suspension;INSTILL 2 SPRAY Every morning  2 spray / nostril QAM; Rx Metoprolol Succinate ER 50 MG Oral Tablet Extended Release 24 Hour;TAKE 1 TABLET DAILY; RPT. 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 PMH  Acute serous otitis media of both ears (381.01) (H65.03) Polyp of sigmoid colon (211.3)  (K63.5). PSH  CABG (CABG); 2011 Stress Test EKG Performed; 10/2009 Surgery Of Male Genitalia Vasectomy Tonsillectomy. Family Hx  Alcoholism Arthritis (V17.7) Family history of diabetes mellitus (V18.0) (Z83.3) Family history of hypertension: Unknown (V17.49) (Z83.49) Hyperlipidemia Hypertension (V17.49) No pertinent family history: Mother Prostate Cancer: Paternal Uncle [phone]). Personal Hx  Caffeine use Marital History - Currently Married Never a smoker No tobacco/smoke exposure Social alcohol use. ROS  Systemic: Not feeling tired (fatigue).  No fever, no night sweats, and no recent weight loss. Head: No headache. Eyes: No eye symptoms. Otolaryngeal: No hearing loss, no earache, no tinnitus, and no purulent nasal discharge.  No nasal passage blockage (stuffiness).  Snoring.  No sneezing  and no hoarseness.  Sore throat. Cardiovascular: No chest pain or discomfort  and no palpitations. Pulmonary: No dyspnea, no cough, and no wheezing. Gastrointestinal: No dysphagia  and no heartburn.  No nausea, no abdominal pain, and no melena.  No diarrhea. Genitourinary: No dysuria. Endocrine: No muscle weakness. Musculoskeletal: No calf muscle cramps, no arthralgias, and no soft tissue swelling. Neurological: No dizziness, no fainting, no tingling, and no numbness. Psychological: No anxiety  and no depression. Skin: No rash. 12 system ROS was obtained and reviewed on the Health Maintenance form dated today.  Positive responses are shown above.  If the symptom is not checked, the patient has denied it. Vital Signs   Recorded by Hamilton,Amy on 13 Mar 2012 03:12 PM BP:129/76,  HR: 77 b/min,  Height: 70.5 in, Weight: 252 lb, BMI: 35.6 kg/m2,  BMI Calculated: 35.65 ,  BSA Calculated: 2.31. Physical Exam  APPEARANCE: Well developed, well nourished, in no acute distress.  Normal affect, in a pleasant mood.  Oriented to time, place and person. COMMUNICATION: Normal voice   HEAD &  FACE:  No  scars, lesions or masses of head and face.  Sinuses nontender to palpation.  Salivary glands without mass or tenderness.  Facial strength symmetric.  No facial lesion, scars, or mass. EYES: EOMI with normal primary gaze alignment. Visual acuity grossly intact.  PERRLA EXTERNAL EAR & NOSE: No scars, lesions or masses  EAC & TYMPANIC MEMBRANE:  EAC shows no obstructing lesions or debris and tympanic membranes are normal bilaterally with good movement to insufflation. GROSS HEARING: Normal   TMJ:  Nontender  INTRANASAL EXAM: No polyps or purulence.  NASOPHARYNX: Normal, without lesions. LIPS, TEETH & GUMS: No lip lesions, normal dentition and normal gums. ORAL CAVITY/OROPHARYNX:  Oral mucosa moist without lesion or asymmetry of the palate, tongue, tonsil or posterior pharynx. NECK:  Supple without adenopathy or mass. THYROID:  Multiple nodules, up to 3-5 cm bilaterally.  NEUROLOGIC:  No gross CN deficits. No nystagmus noted.   LYMPHATIC:  No enlarged nodes palpable. Signature  Electronically signed by : Serena Colonel  M.D.; 03/13/2012 3:37 PM EST.

## 2012-03-28 ENCOUNTER — Encounter (HOSPITAL_COMMUNITY)
Admission: RE | Admit: 2012-03-28 | Discharge: 2012-03-28 | Disposition: A | Discharge status: Home / Self Care | Payer: BC Managed Care – PPO | Source: Ambulatory Visit | Attending: Otolaryngology | Admitting: Otolaryngology

## 2012-03-28 ENCOUNTER — Encounter (HOSPITAL_COMMUNITY): Payer: Self-pay

## 2012-03-28 ENCOUNTER — Ambulatory Visit (HOSPITAL_COMMUNITY)
Admission: RE | Admit: 2012-03-28 | Discharge: 2012-03-28 | Disposition: A | Discharge status: Home / Self Care | Payer: BC Managed Care – PPO | Source: Ambulatory Visit | Attending: Anesthesiology | Admitting: Anesthesiology

## 2012-03-28 DIAGNOSIS — Z01812 Encounter for preprocedural laboratory examination: Secondary | ICD-10-CM | POA: Insufficient documentation

## 2012-03-28 DIAGNOSIS — Z01818 Encounter for other preprocedural examination: Secondary | ICD-10-CM | POA: Insufficient documentation

## 2012-03-28 DIAGNOSIS — I10 Essential (primary) hypertension: Secondary | ICD-10-CM | POA: Insufficient documentation

## 2012-03-28 HISTORY — DX: Nocturia: R35.1

## 2012-03-28 HISTORY — DX: Personal history of colonic polyps: Z86.010

## 2012-03-28 HISTORY — DX: Personal history of other diseases of the digestive system: Z87.19

## 2012-03-28 HISTORY — DX: Personal history of colon polyps, unspecified: Z86.0100

## 2012-03-28 HISTORY — DX: Transient cerebral ischemic attack, unspecified: G45.9

## 2012-03-28 LAB — BASIC METABOLIC PANEL
BUN: 13 mg/dL (ref 6–23)
Calcium: 9.8 mg/dL (ref 8.4–10.5)
GFR calc Af Amer: 63 mL/min — ABNORMAL LOW (ref >90)
GFR calc non Af Amer: 54 mL/min — ABNORMAL LOW (ref >90)
Glucose, Bld: 98 mg/dL (ref 70–99)
Potassium: 4.5 mEq/L (ref 3.5–5.1)
Sodium: 139 mEq/L (ref 135–145)

## 2012-03-28 LAB — CBC
Hemoglobin: 15.5 g/dL (ref 13.0–17.0)
MCH: 28.5 pg (ref 26.0–34.0)
MCHC: 33.8 g/dL (ref 30.0–36.0)
Platelets: 160 10*3/uL (ref 150–400)
RBC: 5.44 MIL/uL (ref 4.22–5.81)

## 2012-03-28 LAB — SURGICAL PCR SCREEN
MRSA, PCR: NEGATIVE
Staphylococcus aureus: NEGATIVE

## 2012-03-28 NOTE — Progress Notes (Addendum)
Dr.McAlhaney is cardiologist with last visit in epic from 10/2011-see him on a yrly basis  Echo report in epic from 2011 and 2013  Stress test done in 2011 with report in epic  Heart cath in epic from 2011  Dr.Thomas Jones with [facility] is Medical MD  EKG in epic from 06/03/11

## 2012-03-28 NOTE — Pre-Procedure Instructions (Signed)
Devon Brady  03/28/2012   Your procedure is scheduled on:  Fri, Jan 17 @ 7:30 AM  Report to [facility] at 5:30 AM.  Call this number if you have problems the morning of surgery: [phone]   Remember:   Do not eat food or drink liquids after midnight.   Take these medicines the morning of surgery with A SIP OF WATER: Fluticasone(Flonase),Loratadine(Claritin),and Metoprolol(Toprol)            Stop taking your Aspirin.No Ibuprofen,Aleve,Goody's,Fish Oil,or herbal medications.   Do not wear jewelry  Do not wear lotions, powders, or colognes. You may wear deodorant.   Men may shave face and neck.  Do not bring valuables to the hospital.  Contacts, dentures or bridgework may not be worn into surgery.  Leave suitcase in the car. After surgery it may be brought to your room.  For patients admitted to the hospital, checkout time is 11:00 AM the day of  discharge.   Patients discharged the day of surgery will not be allowed to drive  home.    Special Instructions: Shower using CHG 2 nights before surgery and the night before surgery.  If you shower the day of surgery use CHG.  Use special wash - you have one bottle of CHG for all showers.  You should use approximately 1/3 of the bottle for each shower.   Please read over the following fact sheets that you were given: Pain Booklet, Coughing and Deep Breathing, MRSA Information and Surgical Site Infection Prevention

## 2012-03-30 MED ORDER — CEFAZOLIN SODIUM-DEXTROSE 2-3 GM-% IV SOLR
2.0000 g | INTRAVENOUS | Status: AC
  Administered 2012-03-31: 2 g via INTRAVENOUS
  Filled 2012-03-30: qty 50

## 2012-03-31 ENCOUNTER — Observation Stay (HOSPITAL_COMMUNITY)
Admission: RE | Admit: 2012-03-31 | Discharge: 2012-04-01 | Disposition: A | Discharge status: Home / Self Care | Payer: BC Managed Care – PPO | Source: Ambulatory Visit | Attending: Otolaryngology | Admitting: Otolaryngology

## 2012-03-31 ENCOUNTER — Ambulatory Visit (HOSPITAL_COMMUNITY): Payer: BC Managed Care – PPO | Admitting: Anesthesiology

## 2012-03-31 ENCOUNTER — Encounter (HOSPITAL_COMMUNITY): Payer: Self-pay | Admitting: Anesthesiology

## 2012-03-31 ENCOUNTER — Encounter (HOSPITAL_COMMUNITY): Payer: Self-pay | Admitting: *Deleted

## 2012-03-31 ENCOUNTER — Encounter (HOSPITAL_COMMUNITY)
Admission: RE | Disposition: A | Discharge status: Home / Self Care | Payer: Self-pay | Source: Ambulatory Visit | Attending: Otolaryngology

## 2012-03-31 DIAGNOSIS — I10 Essential (primary) hypertension: Secondary | ICD-10-CM | POA: Insufficient documentation

## 2012-03-31 DIAGNOSIS — C73 Malignant neoplasm of thyroid gland: Principal | ICD-10-CM | POA: Insufficient documentation

## 2012-03-31 DIAGNOSIS — E785 Hyperlipidemia, unspecified: Secondary | ICD-10-CM | POA: Insufficient documentation

## 2012-03-31 DIAGNOSIS — N529 Male erectile dysfunction, unspecified: Secondary | ICD-10-CM

## 2012-03-31 HISTORY — PX: THYROIDECTOMY: SHX17

## 2012-03-31 LAB — CALCIUM
Calcium: 9.7 mg/dL (ref 8.4–10.5)
Calcium: 9.4 mg/dL (ref 8.4–10.5)

## 2012-03-31 SURGERY — THYROIDECTOMY
Anesthesia: General | Wound class: Clean

## 2012-03-31 MED ORDER — BACITRACIN ZINC 500 UNIT/GM EX OINT
TOPICAL_OINTMENT | CUTANEOUS | Status: AC
  Filled 2012-03-31: qty 15

## 2012-03-31 MED ORDER — PHENYLEPHRINE HCL 10 MG/ML IJ SOLN
10.0000 mg | INTRAVENOUS | Status: DC | PRN
  Administered 2012-03-31: 25 ug/min via INTRAVENOUS

## 2012-03-31 MED ORDER — PHENYLEPHRINE HCL 10 MG/ML IJ SOLN
INTRAMUSCULAR | Status: DC | PRN
  Administered 2012-03-31 (×4): 80 ug via INTRAVENOUS

## 2012-03-31 MED ORDER — HYDROCODONE-ACETAMINOPHEN 5-325 MG PO TABS
1.0000 | ORAL_TABLET | ORAL | Status: DC | PRN
  Administered 2012-03-31 – 2012-04-01 (×3): 2 via ORAL
  Filled 2012-03-31 (×3): qty 2

## 2012-03-31 MED ORDER — MEPERIDINE HCL 25 MG/ML IJ SOLN: 6.2500 mg | INTRAMUSCULAR | Status: DC | PRN

## 2012-03-31 MED ORDER — HYDROCODONE-ACETAMINOPHEN 7.5-325 MG PO TABS: 1.0000 | ORAL_TABLET | Freq: Four times a day (QID) | ORAL | Status: DC | PRN

## 2012-03-31 MED ORDER — STERILE WATER FOR IRRIGATION IR SOLN
Status: DC | PRN
  Administered 2012-03-31: 500 mL

## 2012-03-31 MED ORDER — LIDOCAINE HCL (CARDIAC) 20 MG/ML IV SOLN
INTRAVENOUS | Status: DC | PRN
  Administered 2012-03-31: 70 mg via INTRAVENOUS

## 2012-03-31 MED ORDER — NEOSTIGMINE METHYLSULFATE 1 MG/ML IJ SOLN
INTRAMUSCULAR | Status: DC | PRN
  Administered 2012-03-31: 3 mg via INTRAVENOUS
  Administered 2012-03-31: 1 mg via INTRAVENOUS

## 2012-03-31 MED ORDER — NITROGLYCERIN 0.4 MG SL SUBL: 0.4000 mg | SUBLINGUAL_TABLET | SUBLINGUAL | Status: DC | PRN

## 2012-03-31 MED ORDER — ASPIRIN EC 81 MG PO TBEC
81.0000 mg | DELAYED_RELEASE_TABLET | Freq: Every day | ORAL | Status: DC
  Administered 2012-04-01: 81 mg via ORAL
  Filled 2012-03-31: qty 1

## 2012-03-31 MED ORDER — PROMETHAZINE HCL 25 MG RE SUPP: 25.0000 mg | Freq: Four times a day (QID) | RECTAL | Status: DC | PRN

## 2012-03-31 MED ORDER — HYDROMORPHONE HCL PF 1 MG/ML IJ SOLN
0.2500 mg | INTRAMUSCULAR | Status: DC | PRN
  Administered 2012-03-31 (×2): 0.5 mg via INTRAVENOUS

## 2012-03-31 MED ORDER — LACTATED RINGERS IV SOLN
INTRAVENOUS | Status: DC | PRN
  Administered 2012-03-31: 07:00:00 via INTRAVENOUS

## 2012-03-31 MED ORDER — GLYCOPYRROLATE 0.2 MG/ML IJ SOLN
INTRAMUSCULAR | Status: DC | PRN
  Administered 2012-03-31: 0.4 mg via INTRAVENOUS
  Administered 2012-03-31: 0.2 mg via INTRAVENOUS

## 2012-03-31 MED ORDER — LEVOTHYROXINE SODIUM 100 MCG PO TABS: 100.0000 ug | ORAL_TABLET | Freq: Every day | ORAL | Status: DC

## 2012-03-31 MED ORDER — DEXTROSE-NACL 5-0.9 % IV SOLN
INTRAVENOUS | Status: DC
  Administered 2012-04-01: 01:00:00 via INTRAVENOUS

## 2012-03-31 MED ORDER — MIDAZOLAM HCL 5 MG/5ML IJ SOLN
INTRAMUSCULAR | Status: DC | PRN
  Administered 2012-03-31 (×2): 1 mg via INTRAVENOUS

## 2012-03-31 MED ORDER — PROMETHAZINE HCL 25 MG PO TABS: 25.0000 mg | ORAL_TABLET | Freq: Four times a day (QID) | ORAL | Status: DC | PRN

## 2012-03-31 MED ORDER — LEVOTHYROXINE SODIUM 100 MCG PO TABS
100.0000 ug | ORAL_TABLET | Freq: Every day | ORAL | Status: DC
  Administered 2012-04-01: 100 ug via ORAL
  Filled 2012-03-31 (×2): qty 1

## 2012-03-31 MED ORDER — METOPROLOL SUCCINATE ER 50 MG PO TB24
50.0000 mg | ORAL_TABLET | Freq: Every day | ORAL | Status: DC
  Administered 2012-04-01: 50 mg via ORAL
  Filled 2012-03-31 (×2): qty 1

## 2012-03-31 MED ORDER — VECURONIUM BROMIDE 10 MG IV SOLR
INTRAVENOUS | Status: DC | PRN
  Administered 2012-03-31: 1 mg via INTRAVENOUS

## 2012-03-31 MED ORDER — MORPHINE SULFATE 2 MG/ML IJ SOLN
INTRAMUSCULAR | Status: AC
  Filled 2012-03-31: qty 1

## 2012-03-31 MED ORDER — PROMETHAZINE HCL 25 MG/ML IJ SOLN: 6.2500 mg | INTRAMUSCULAR | Status: DC | PRN

## 2012-03-31 MED ORDER — HYDROMORPHONE HCL PF 1 MG/ML IJ SOLN
INTRAMUSCULAR | Status: AC
  Filled 2012-03-31: qty 1

## 2012-03-31 MED ORDER — 0.9 % SODIUM CHLORIDE (POUR BTL) OPTIME
TOPICAL | Status: DC | PRN
  Administered 2012-03-31: 1000 mL

## 2012-03-31 MED ORDER — FENTANYL CITRATE 0.05 MG/ML IJ SOLN
INTRAMUSCULAR | Status: DC | PRN
  Administered 2012-03-31 (×3): 50 ug via INTRAVENOUS
  Administered 2012-03-31: 100 ug via INTRAVENOUS

## 2012-03-31 MED ORDER — MORPHINE SULFATE 2 MG/ML IJ SOLN
2.0000 mg | INTRAMUSCULAR | Status: DC | PRN
  Administered 2012-03-31: 2 mg via INTRAVENOUS

## 2012-03-31 MED ORDER — LIDOCAINE HCL 4 % MT SOLN
OROMUCOSAL | Status: DC | PRN
  Administered 2012-03-31: 4 mL via TOPICAL

## 2012-03-31 MED ORDER — IBUPROFEN 100 MG/5ML PO SUSP
400.0000 mg | Freq: Four times a day (QID) | ORAL | Status: DC | PRN
  Filled 2012-03-31: qty 20

## 2012-03-31 MED ORDER — OXYCODONE HCL 5 MG/5ML PO SOLN: 5.0000 mg | Freq: Once | ORAL | Status: DC | PRN

## 2012-03-31 MED ORDER — ROCURONIUM BROMIDE 100 MG/10ML IV SOLN
INTRAVENOUS | Status: DC | PRN
  Administered 2012-03-31: 50 mg via INTRAVENOUS

## 2012-03-31 MED ORDER — OXYCODONE HCL 5 MG PO TABS: 5.0000 mg | ORAL_TABLET | Freq: Once | ORAL | Status: DC | PRN

## 2012-03-31 MED ORDER — PROPOFOL 10 MG/ML IV BOLUS
INTRAVENOUS | Status: DC | PRN
  Administered 2012-03-31: 180 mg via INTRAVENOUS
  Administered 2012-03-31: 20 mg via INTRAVENOUS

## 2012-03-31 MED ORDER — LIDOCAINE-EPINEPHRINE 1 %-1:100000 IJ SOLN
INTRAMUSCULAR | Status: AC
  Filled 2012-03-31: qty 1

## 2012-03-31 MED ORDER — ARTIFICIAL TEARS OP OINT
TOPICAL_OINTMENT | OPHTHALMIC | Status: DC | PRN
  Administered 2012-03-31: 1 via OPHTHALMIC

## 2012-03-31 MED ORDER — EPHEDRINE SULFATE 50 MG/ML IJ SOLN
INTRAMUSCULAR | Status: DC | PRN
  Administered 2012-03-31 (×2): 5 mg via INTRAVENOUS

## 2012-03-31 MED ORDER — ATORVASTATIN CALCIUM 10 MG PO TABS
10.0000 mg | ORAL_TABLET | Freq: Every day | ORAL | Status: DC
  Administered 2012-03-31: 10 mg via ORAL
  Filled 2012-03-31 (×2): qty 1

## 2012-03-31 MED ORDER — ONDANSETRON HCL 4 MG/2ML IJ SOLN
INTRAMUSCULAR | Status: DC | PRN
  Administered 2012-03-31: 4 mg via INTRAVENOUS

## 2012-03-31 SURGICAL SUPPLY — 52 items
APPLIER CLIP 9.375 SM OPEN (CLIP)
ATTRACTOMAT 16X20 MAGNETIC DRP (DRAPES) ×2 IMPLANT
CANISTER SUCTION 2500CC (MISCELLANEOUS) ×2 IMPLANT
CLEANER TIP ELECTROSURG 2X2 (MISCELLANEOUS) ×2 IMPLANT
CLIP APPLIE 9.375 SM OPEN (CLIP) IMPLANT
CLOTH BEACON ORANGE TIMEOUT ST (SAFETY) ×2 IMPLANT
CONT SPEC 4OZ CLIKSEAL STRL BL (MISCELLANEOUS) ×2 IMPLANT
CORDS BIPOLAR (ELECTRODE) ×2 IMPLANT
COVER SURGICAL LIGHT HANDLE (MISCELLANEOUS) ×2 IMPLANT
DECANTER SPIKE VIAL GLASS SM (MISCELLANEOUS) ×2 IMPLANT
DERMABOND ADVANCED (GAUZE/BANDAGES/DRESSINGS) ×1
DERMABOND ADVANCED .7 DNX12 (GAUZE/BANDAGES/DRESSINGS) ×1 IMPLANT
DRAIN SNY 10 ROU (WOUND CARE) IMPLANT
DRAIN WOUND SNY 15 RND (WOUND CARE) ×2 IMPLANT
ELECT COATED BLADE 2.86 ST (ELECTRODE) ×2 IMPLANT
ELECT REM PT RETURN 9FT ADLT (ELECTROSURGICAL) ×2
ELECTRODE REM PT RTRN 9FT ADLT (ELECTROSURGICAL) ×1 IMPLANT
EVACUATOR SILICONE 100CC (DRAIN) ×2 IMPLANT
GAUZE SPONGE 4X4 16PLY XRAY LF (GAUZE/BANDAGES/DRESSINGS) ×2 IMPLANT
GLOVE BIO SURGEON STRL SZ 6.5 (GLOVE) ×2 IMPLANT
GLOVE BIO SURGEON STRL SZ8 (GLOVE) ×2 IMPLANT
GLOVE BIOGEL PI IND STRL 7.0 (GLOVE) ×1 IMPLANT
GLOVE BIOGEL PI IND STRL 8 (GLOVE) ×1 IMPLANT
GLOVE BIOGEL PI INDICATOR 7.0 (GLOVE) ×1
GLOVE BIOGEL PI INDICATOR 8 (GLOVE) ×1
GLOVE ECLIPSE 7.5 STRL STRAW (GLOVE) ×2 IMPLANT
GLOVE EXAM NITRILE MICROT MD (GLOVE) ×2 IMPLANT
GLOVE SURG SS PI 7.0 STRL IVOR (GLOVE) ×2 IMPLANT
GOWN PREVENTION PLUS LG XLONG (DISPOSABLE) ×6 IMPLANT
GOWN STRL REIN 2XL XLG LVL4 (GOWN DISPOSABLE) ×2 IMPLANT
KIT BASIN OR (CUSTOM PROCEDURE TRAY) ×2 IMPLANT
KIT ROOM TURNOVER OR (KITS) ×2 IMPLANT
NEEDLE 27GAX1X1/2 (NEEDLE) ×2 IMPLANT
NS IRRIG 1000ML POUR BTL (IV SOLUTION) ×2 IMPLANT
PAD ARMBOARD 7.5X6 YLW CONV (MISCELLANEOUS) ×4 IMPLANT
PENCIL FOOT CONTROL (ELECTRODE) ×2 IMPLANT
SPECIMEN JAR MEDIUM (MISCELLANEOUS) IMPLANT
SPONGE INTESTINAL PEANUT (DISPOSABLE) ×2 IMPLANT
STAPLER VISISTAT 35W (STAPLE) ×2 IMPLANT
SUT CHROMIC 3 0 SH 27 (SUTURE) IMPLANT
SUT CHROMIC 4 0 PS 2 18 (SUTURE) ×4 IMPLANT
SUT ETHILON 3 0 PS 1 (SUTURE) IMPLANT
SUT ETHILON 5 0 P 3 18 (SUTURE) ×1
SUT NYLON ETHILON 5-0 P-3 1X18 (SUTURE) ×1 IMPLANT
SUT SILK 3 0 REEL (SUTURE) IMPLANT
SUT SILK 3 0 SH 30 (SUTURE) ×4 IMPLANT
SUT SILK 4 0 P 3 (SUTURE) ×2 IMPLANT
SUT SILK 4 0 REEL (SUTURE) ×6 IMPLANT
TOWEL OR 17X24 6PK STRL BLUE (TOWEL DISPOSABLE) ×2 IMPLANT
TOWEL OR 17X26 10 PK STRL BLUE (TOWEL DISPOSABLE) ×2 IMPLANT
TRAY ENT MC OR (CUSTOM PROCEDURE TRAY) ×2 IMPLANT
WATER STERILE IRR 1000ML POUR (IV SOLUTION) ×2 IMPLANT

## 2012-03-31 NOTE — Preoperative (Signed)
Beta Blockers   Reason not to administer Beta Blockers:Not Applicable 

## 2012-03-31 NOTE — Transfer of Care (Signed)
Immediate Anesthesia Transfer of Care Note  Patient: Devon Brady  Procedure(s) Performed: Procedure(s) (LRB) with comments: THYROIDECTOMY (N/A)  Patient Location: PACU  Anesthesia Type:General  Level of Consciousness: awake, alert  and oriented  Airway & Oxygen Therapy: Patient Spontanous Breathing and Patient connected to face mask oxygen  Post-op Assessment: Report given to PACU RN, Post -op Vital signs reviewed and stable and Patient moving all extremities X 4  Post vital signs: Reviewed and stable  Complications: No apparent anesthesia complications

## 2012-03-31 NOTE — Op Note (Signed)
OPERATIVE REPORT  DATE OF SURGERY: 03/31/2012  PATIENT:  Devon Brady,  62 y.o. male  PRE-OPERATIVE DIAGNOSIS:  Thyroid neoplasm  POST-OPERATIVE DIAGNOSIS:  Thyroid neoplasm  PROCEDURE:  Procedure(s): THYROIDECTOMY  SURGEON:  Susy Frizzle, MD  ASSISTANTS: XXX   ANESTHESIA:   General   EBL:  50 ml  DRAINS: 15 Fr   LOCAL MEDICATIONS USED:  None  SPECIMEN:  Total thyroid  COUNTS:  Correct  PROCEDURE DETAILS: The patient was taken to the operating room and placed on the operating table in the supine position. A shoulder roll was placed beneath the shoulder blades and the neck was extended. The neck was prepped and draped in a standard fashion. A low collar transverse incision was outlined marking pen and was incised with electrocautery. Dissection was continued down through the platysma layer. Subplatysmal flaps were elevated superiorly to the thyroid cartilage and inferiorly to the clavicle. Self-retaining thyroid retractor was used throughout the case. The midline fascia was divided to the strap muscles were reflected laterally off both lobes of the thyroid. The thyroid was severely enlarged in 3 parts. The left lobe was enlarged and the right lobe was bilobed and enlarged. Both sides were dissected in a similar fashion. Reflecting the strap muscles laterally and reflecting the lobe of the gland towards the midline the superior pole was addressed first. The vasculature was separately identified, ligated between clamps and divided. As the lobe was brought forward from the superior aspect, a parathyroid was identified on the right side and preserved with its blood supply. None was identified on the left. The recurrent nerve was identified bilaterally and preserved. The middle thyroid veins were ligated between clamps and divided. Inferior vasculature was treated in a similar fashion. Berry's ligament was divided using electrocautery and clamping vessels as needed. The gland was a  little bit sticky along the trachea but did dissected off completely. The left lobe was marked with a suture. The wound was irrigated with saline and hemostasis was completed with additional ties and bipolar cautery as needed. The midline fascia was reapproximated with chromic suture. A 15 French round drain was left in the wound and exited through the right side of the incision and secured in place with a silk suture. The platysma layer was reapproximated with chromic suture. A running subcuticular closure was accomplished also with chromic suture. Dermabond was used on the skin. The patient was awakened, extubated and transferred to recovery in stable condition.  PATIENT DISPOSITION:  To PACU, stable

## 2012-03-31 NOTE — Anesthesia Preprocedure Evaluation (Addendum)
Anesthesia Evaluation  Patient identified by MRN, date of birth, ID band Patient awake    Reviewed: Allergy & Precautions, H&P , NPO status , Patient's Chart, lab work & pertinent test results  Airway Mallampati: II TM Distance: >3 FB Neck ROM: Full    Dental No notable dental hx.    Pulmonary sleep apnea ,  breath sounds clear to auscultation  Pulmonary exam normal       Cardiovascular hypertension, Pt. 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   Neuro/Psych PSYCHIATRIC DISORDERS TIAnegative neurological ROS     GI/Hepatic Neg liver ROS, hiatal hernia,   Endo/Other  negative endocrine ROS  Renal/GU CRFRenal disease     Musculoskeletal negative musculoskeletal ROS (+)   Abdominal (+) + obese,   Peds negative pediatric ROS (+)  Hematology  (+) anemia ,   Anesthesia Other Findings   Reproductive/Obstetrics negative OB ROS                         Anesthesia Physical Anesthesia Plan  ASA: III  Anesthesia Plan: General ETT   Post-op Pain Management:    Induction: Intravenous  Airway Management Planned: Oral ETT  Additional Equipment:   Intra-op Plan:   Post-operative Plan: Extubation in OR  Informed Consent: I have reviewed the patients History and Physical, chart, labs and discussed the procedure including the risks, benefits and alternatives for the proposed anesthesia with the patient or authorized representative who has indicated his/her understanding and acceptance.   Dental Advisory Given  Plan Discussed with:   Anesthesia Plan Comments:        Anesthesia Quick Evaluation

## 2012-03-31 NOTE — Progress Notes (Signed)
03/31/2012 5:53 PM  Devon Brady [id number]  Post-Op Check    Temp:  [96.9 F (36.1 C)-98.4 F (36.9 C)] 98.1 F (36.7 C) (01/17 1423) Pulse Rate:  [70-86] 70  (01/17 1423) Resp:  [5-20] 20  (01/17 1423) BP: (117-144)/(77-92) 117/77 mmHg (01/17 1423) SpO2:  [95 %-100 %] 100 % (01/17 1423) Weight:  [109.77 kg (242 lb)] 109.77 kg (242 lb) (01/17 1225),     Intake/Output Summary (Last 24 hours) at 03/31/12 1753 Last data filed at 03/31/12 1702  Gross per 24 hour  Intake   1050 ml  Output    295 ml  Net    755 ml   JP drain 45 ml  Results for orders placed during the hospital encounter of 03/31/12 (from the past 24 hour(s))  CALCIUM     Status: Normal   Collection Time   03/31/12 10:40 AM      Component Value Range   Calcium 9.7  8.4 - 10.5 mg/dL   PM calcium pending  SUBJECTIVE:  Mod pain.  No SOB, chest pain.  Voiding small amounts with some difficulty and sense of incomplete emptying.  OBJECTIVE:  Wound flat, drain in position. Air leak into drain bulb.  Voice strong.  No stridor.  IMPRESSION:  Drain malfunction.  Possible overflow incontinence.  PLAN: drain to wall suction.  Bladder scan, possible I/O cath .  Follow Ca++.  Anticipate drain out and d/c home in AM.  [place], [place]

## 2012-03-31 NOTE — Interval H&P Note (Signed)
History and Physical Interval Note:  03/31/2012 7:15 AM  Devon Brady  has presented today for surgery, with the diagnosis of Thyroid neoplasm  The various methods of treatment have been discussed with the patient and family. After consideration of risks, benefits and other options for treatment, the patient has consented to  Procedure(s) (LRB) with comments: THYROIDECTOMY (N/A) as a surgical intervention .  The patient's history has been reviewed, patient examined, no change in status, stable for surgery.  I have reviewed the patient's chart and labs.  Questions were answered to the patient's satisfaction.     Lani Mendiola

## 2012-03-31 NOTE — Progress Notes (Signed)
JP  Placed to wall suction

## 2012-03-31 NOTE — Anesthesia Postprocedure Evaluation (Signed)
  Anesthesia Post-op Note  Patient: Devon Brady  Procedure(s) Performed: Procedure(s) (LRB) with comments: THYROIDECTOMY (N/A)  Patient Location: PACU  Anesthesia Type:General  Level of Consciousness: awake  Airway and Oxygen Therapy: Patient Spontanous Breathing  Post-op Pain: mild  Post-op Assessment: Post-op Vital signs reviewed, Patient's Cardiovascular Status Stable and Respiratory Function Stable  Post-op Vital Signs: stable  Complications: No apparent anesthesia complications

## 2012-03-31 NOTE — Anesthesia Procedure Notes (Signed)

## 2012-04-01 ENCOUNTER — Encounter (HOSPITAL_COMMUNITY): Payer: Self-pay | Admitting: *Deleted

## 2012-04-01 LAB — CALCIUM: Calcium: 8.8 mg/dL (ref 8.4–10.5)

## 2012-04-01 NOTE — Progress Notes (Signed)
04/01/2012 12:02 PM  Devon Brady [id number]  Post-Op Day 1    Temp:  [98 F (36.7 C)-98.4 F (36.9 C)] 98.2 F (36.8 C) (01/18 1000) Pulse Rate:  [63-81] 69  (01/18 1000) Resp:  [15-20] 18  (01/18 1000) BP: (112-135)/(67-94) 130/77 mmHg (01/18 1000) SpO2:  [95 %-100 %] 100 % (01/18 1000) Weight:  [109.77 kg (242 lb)] 109.77 kg (242 lb) (01/17 1225),     Intake/Output Summary (Last 24 hours) at 04/01/12 1202 Last data filed at 04/01/12 0900  Gross per 24 hour  Intake 2091.25 ml  Output   2450 ml  Net -358.75 ml   JP 30 cc.  Results for orders placed during the hospital encounter of 03/31/12 (from the past 24 hour(s))  CALCIUM     Status: Normal   Collection Time   03/31/12  5:51 PM      Component Value Range   Calcium 9.4  8.4 - 10.5 mg/dL  CALCIUM     Status: Normal   Collection Time   04/01/12  2:14 AM      Component Value Range   Calcium 8.9  8.4 - 10.5 mg/dL  CALCIUM     Status: Normal   Collection Time   04/01/12 10:38 AM      Component Value Range   Calcium 8.8  8.4 - 10.5 mg/dL    SUBJECTIVE:  Mod pain.  NO SOB, chest pain.  No paresthesias or cramping.  Required I/O cath last PM, voiding well since then.  OBJECTIVE:  Wound flat.  Voice clear.  Drain functioning and D/C'd.  IMPRESSION:  Satisfactory check  PLAN:  Wound care.  Discharge home.  Admit: 17 JAN Discharge:  18 JAN Final Dx: Thyroid goiter Proc: total thyroidectomy, 17 JAN Comp:  None Cond:  Ambulatory.  Pain controlled.  Drain removed.  Voiding well.  Calcium satisfactory. R/v:  Dr. Okey Dupre, 2 weeks Rx:  Vicodin, Phenergan, Synthroid. Instructions written and given.  Flo Shanks

## 2012-04-03 ENCOUNTER — Encounter (HOSPITAL_COMMUNITY): Payer: Self-pay | Admitting: Otolaryngology

## 2012-04-14 ENCOUNTER — Encounter: Payer: Self-pay | Admitting: Internal Medicine

## 2012-04-14 ENCOUNTER — Ambulatory Visit (INDEPENDENT_AMBULATORY_CARE_PROVIDER_SITE_OTHER): Payer: BC Managed Care – PPO | Admitting: Internal Medicine

## 2012-04-14 VITALS — BP 120/70 | HR 79 | Temp 97.7°F | Resp 16 | Wt 247.8 lb

## 2012-04-14 DIAGNOSIS — J019 Acute sinusitis, unspecified: Secondary | ICD-10-CM

## 2012-04-14 MED ORDER — AZITHROMYCIN 500 MG PO TABS: 500.0000 mg | ORAL_TABLET | Freq: Every day | ORAL | Status: DC

## 2012-04-14 NOTE — Progress Notes (Signed)
Subjective:    Patient ID: Devon Brady, male    DOB: 05-25-1950, 62 y.o.   MRN: [id number]  Sinusitis This is a new problem. The current episode started 1 to 4 weeks ago. The problem has been gradually worsening since onset. There has been no fever. His pain is at a severity of 0/10. He is experiencing no pain. Associated symptoms include congestion, sinus pressure and a sore throat. Pertinent negatives include no chills, coughing, diaphoresis, ear pain, headaches, hoarse voice, neck pain, shortness of breath, sneezing or swollen glands. Past treatments include spray decongestants and oral decongestants. The treatment provided mild relief.      Review of Systems  Constitutional: Negative for fever, chills, diaphoresis, activity change, appetite change, fatigue and unexpected weight change.  HENT: Positive for congestion, sore throat and sinus pressure. Negative for ear pain, nosebleeds, hoarse voice, rhinorrhea, sneezing, drooling, trouble swallowing, neck pain, dental problem, voice change and postnasal drip.   Eyes: Negative.   Respiratory: Negative.  Negative for cough, chest tightness, shortness of breath, wheezing and stridor.   Cardiovascular: Negative for chest pain, palpitations and leg swelling.  Gastrointestinal: Negative for nausea, vomiting, abdominal pain, diarrhea, constipation and blood in stool.  Genitourinary: Negative.   Musculoskeletal: Negative.   Skin: Negative for color change, pallor, rash and wound.  Neurological: Negative for dizziness, tremors, seizures, syncope, speech difficulty, weakness, light-headedness, numbness and headaches.  Hematological: Negative for adenopathy. Does not bruise/bleed easily.  Psychiatric/Behavioral: Negative.        Objective:   Physical Exam  Vitals reviewed. Constitutional: He is oriented to person, place, and time. He appears well-developed and well-nourished.  Non-toxic appearance. He does not have a sickly appearance. He  does not appear ill. No distress.  HENT:  Head: Normocephalic and atraumatic. No trismus in the jaw.  Right Ear: Hearing, tympanic membrane, external ear and ear canal normal.  Left Ear: Hearing, tympanic membrane, external ear and ear canal normal.  Nose: Mucosal edema and rhinorrhea present. No sinus tenderness. Right sinus exhibits maxillary sinus tenderness. Right sinus exhibits no frontal sinus tenderness. Left sinus exhibits maxillary sinus tenderness. Left sinus exhibits no frontal sinus tenderness.  Mouth/Throat: Oropharynx is clear and moist and mucous membranes are normal. Mucous membranes are not pale, not dry and not cyanotic. No oral lesions. No uvula swelling. No oropharyngeal exudate, posterior oropharyngeal edema, posterior oropharyngeal erythema or tonsillar abscesses.  Eyes: Conjunctivae normal are normal. Right eye exhibits no discharge. Left eye exhibits no discharge. No scleral icterus.  Neck: Normal range of motion. Neck supple. No JVD present. No tracheal deviation present. No thyromegaly present.  Cardiovascular: Normal rate, regular rhythm, normal heart sounds and intact distal pulses.  Exam reveals no gallop and no friction rub.   No murmur heard. Pulmonary/Chest: Effort normal and breath sounds normal. No stridor. No respiratory distress. He has no wheezes. He has no rales. He exhibits no tenderness.  Abdominal: Soft. Bowel sounds are normal. He exhibits no distension and no mass. There is no tenderness. There is no rebound and no guarding.  Musculoskeletal: Normal range of motion. He exhibits no edema and no tenderness.  Lymphadenopathy:    He has no cervical adenopathy.  Neurological: He is oriented to person, place, and time.  Skin: Skin is warm and dry. No rash noted. He is not diaphoretic. No erythema. No pallor.  Psychiatric: He has a normal mood and affect. His behavior is normal. Judgment and thought content normal.      Lab Results  Component Value Date    WBC 6.6 03/28/2012   HGB 15.5 03/28/2012   HCT 45.8 03/28/2012   PLT 160 03/28/2012   GLUCOSE 98 03/28/2012   CHOL 157 01/17/2012   TRIG 89.0 01/17/2012   HDL 39.60 01/17/2012   LDLCALC 100* 01/17/2012   ALT 18 02/14/2012   AST 22 02/14/2012   NA 139 03/28/2012   K 4.5 03/28/2012   CL 103 03/28/2012   CREATININE 1.38* 03/28/2012   BUN 13 03/28/2012   CO2 27 03/28/2012   TSH 0.16* 12/10/2009   PSA 0.73 12/13/2011   INR 1.08 06/10/2011   HGBA1C 6.2 12/04/2009      Assessment & Plan:

## 2012-04-14 NOTE — Patient Instructions (Signed)

## 2012-04-16 DIAGNOSIS — J0100 Acute maxillary sinusitis, unspecified: Secondary | ICD-10-CM | POA: Insufficient documentation

## 2012-04-16 NOTE — Assessment & Plan Note (Signed)
Start zithromax for the infection

## 2012-04-24 ENCOUNTER — Other Ambulatory Visit (INDEPENDENT_AMBULATORY_CARE_PROVIDER_SITE_OTHER): Payer: BC Managed Care – PPO

## 2012-04-24 DIAGNOSIS — I2510 Atherosclerotic heart disease of native coronary artery without angina pectoris: Secondary | ICD-10-CM

## 2012-04-24 LAB — HEPATIC FUNCTION PANEL
Alkaline Phosphatase: 59 U/L (ref 39–117)
Bilirubin, Direct: 0 mg/dL (ref 0.0–0.3)
Total Bilirubin: 0.7 mg/dL (ref 0.3–1.2)

## 2012-04-24 LAB — LIPID PANEL
LDL Cholesterol: 82 mg/dL (ref 0–99)
VLDL: 17 mg/dL (ref 0.0–40.0)

## 2012-04-28 ENCOUNTER — Ambulatory Visit: Payer: BC Managed Care – PPO

## 2012-05-01 ENCOUNTER — Telehealth: Payer: Self-pay | Admitting: Oncology

## 2012-05-01 ENCOUNTER — Encounter: Payer: Self-pay | Admitting: Oncology

## 2012-05-01 NOTE — Telephone Encounter (Signed)
returned pt called and r/s flush...Devon KitchenMarland KitchenDone

## 2012-05-03 LAB — HM DIABETES EYE EXAM

## 2012-05-04 ENCOUNTER — Telehealth: Payer: Self-pay | Admitting: Cardiovascular Disease

## 2012-05-04 NOTE — Telephone Encounter (Signed)
Spoke with pt and reviewed lipid and liver results with him.  

## 2012-05-04 NOTE — Telephone Encounter (Signed)
New Problem   Pt is returning call from yesterday. Was told to give the office a call.

## 2012-05-05 ENCOUNTER — Ambulatory Visit (HOSPITAL_BASED_OUTPATIENT_CLINIC_OR_DEPARTMENT_OTHER): Payer: BC Managed Care – PPO

## 2012-05-05 VITALS — BP 123/79 | HR 75 | Temp 98.0°F | Resp 16

## 2012-05-05 DIAGNOSIS — C8190 Hodgkin lymphoma, unspecified, unspecified site: Secondary | ICD-10-CM

## 2012-05-05 DIAGNOSIS — Z452 Encounter for adjustment and management of vascular access device: Secondary | ICD-10-CM

## 2012-05-05 MED ORDER — HEPARIN SOD (PORK) LOCK FLUSH 100 UNIT/ML IV SOLN
500.0000 [IU] | Freq: Once | INTRAVENOUS | Status: AC
  Administered 2012-05-05: 500 [IU] via INTRAVENOUS
  Filled 2012-05-05: qty 5

## 2012-05-05 MED ORDER — SODIUM CHLORIDE 0.9 % IJ SOLN
10.0000 mL | INTRAMUSCULAR | Status: DC | PRN
  Administered 2012-05-05: 10 mL via INTRAVENOUS
  Filled 2012-05-05: qty 10

## 2012-05-15 ENCOUNTER — Other Ambulatory Visit (HOSPITAL_BASED_OUTPATIENT_CLINIC_OR_DEPARTMENT_OTHER): Payer: BC Managed Care – PPO | Admitting: Lab

## 2012-05-15 ENCOUNTER — Ambulatory Visit (HOSPITAL_COMMUNITY)
Admission: RE | Admit: 2012-05-15 | Discharge: 2012-05-15 | Disposition: A | Discharge status: Home / Self Care | Payer: BC Managed Care – PPO | Source: Ambulatory Visit | Attending: Oncology | Admitting: Oncology

## 2012-05-15 DIAGNOSIS — Z951 Presence of aortocoronary bypass graft: Secondary | ICD-10-CM | POA: Insufficient documentation

## 2012-05-15 DIAGNOSIS — N1330 Unspecified hydronephrosis: Secondary | ICD-10-CM

## 2012-05-15 DIAGNOSIS — D649 Anemia, unspecified: Secondary | ICD-10-CM

## 2012-05-15 DIAGNOSIS — C8190 Hodgkin lymphoma, unspecified, unspecified site: Secondary | ICD-10-CM

## 2012-05-15 DIAGNOSIS — R591 Generalized enlarged lymph nodes: Secondary | ICD-10-CM

## 2012-05-15 DIAGNOSIS — D72829 Elevated white blood cell count, unspecified: Secondary | ICD-10-CM

## 2012-05-15 DIAGNOSIS — E041 Nontoxic single thyroid nodule: Secondary | ICD-10-CM

## 2012-05-15 LAB — CBC WITH DIFFERENTIAL/PLATELET
Basophils Absolute: 0 10*3/uL (ref 0.0–0.1)
Eosinophils Absolute: 0.2 10*3/uL (ref 0.0–0.5)
HGB: 15.1 g/dL (ref 13.0–17.1)
LYMPH%: 38.6 % (ref 14.0–49.0)
MCH: 28.7 pg (ref 27.2–33.4)
MCV: 86.9 fL (ref 79.3–98.0)
MONO%: 6.2 % (ref 0.0–14.0)
NEUT#: 3.1 10*3/uL (ref 1.5–6.5)
NEUT%: 51.4 % (ref 39.0–75.0)
Platelets: 179 10*3/uL (ref <2.0)

## 2012-05-15 LAB — BASIC METABOLIC PANEL (CC13)
BUN: 17.3 mg/dL (ref 7.0–26.0)
Calcium: 9.7 mg/dL (ref 8.4–10.4)
Creatinine: 1.7 mg/dL — ABNORMAL HIGH (ref 0.7–1.3)
Glucose: 106 mg/dl — ABNORMAL HIGH (ref 70–99)
Potassium: 4.3 mEq/L (ref 3.5–5.1)

## 2012-05-15 MED ORDER — IOHEXOL 300 MG/ML  SOLN
100.0000 mL | Freq: Once | INTRAMUSCULAR | Status: AC | PRN
  Administered 2012-05-15: 100 mL via INTRAVENOUS

## 2012-05-16 NOTE — Patient Instructions (Signed)
1.  History of lymphoma.  No evidence of recurrent disease. 2.  Chronic kidney disease:  Most likely due to hypertension and past obstruction from lymphoma:  Continue to monitor.  If worsens, will proceed with more evaluation.

## 2012-05-17 ENCOUNTER — Ambulatory Visit (HOSPITAL_BASED_OUTPATIENT_CLINIC_OR_DEPARTMENT_OTHER): Payer: BC Managed Care – PPO | Admitting: Oncology

## 2012-05-17 ENCOUNTER — Telehealth: Payer: Self-pay | Admitting: Oncology

## 2012-05-17 ENCOUNTER — Encounter: Payer: Self-pay | Admitting: Oncology

## 2012-05-17 VITALS — BP 136/87 | HR 65 | Temp 97.2°F | Resp 20 | Ht 70.0 in | Wt 253.6 lb

## 2012-05-17 DIAGNOSIS — C8190 Hodgkin lymphoma, unspecified, unspecified site: Secondary | ICD-10-CM

## 2012-05-17 DIAGNOSIS — N1330 Unspecified hydronephrosis: Secondary | ICD-10-CM

## 2012-05-17 DIAGNOSIS — G5690 Unspecified mononeuropathy of unspecified upper limb: Secondary | ICD-10-CM

## 2012-05-17 DIAGNOSIS — C73 Malignant neoplasm of thyroid gland: Secondary | ICD-10-CM

## 2012-05-17 NOTE — Telephone Encounter (Signed)
, °

## 2012-05-17 NOTE — Progress Notes (Signed)
[facility]  Telephone:([phone] [phone]   OFFICE PROGRESS NOTE   Cc:  Sanda Linger, MD  DIAGNOSIS, PAST THERAPY, AND CURRENT THERAPY:   1.  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   He received d1, d15, ABVD with Neulasta injection the day after. Plan is for minimal of 6 cycles total. 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  He was on chemo between 06/11/2011 and 11/13/2011.  Patient deferred consolidative radiation after meeting with Rad Onc.  He has been on watchful observation for this.   2.  Papillary thyroid cancer:  S/p total thyroidectomy with Dr. Serena Colonel on 03/31/2012.  He is seeing Dr. Talmage Nap with plan for iodine ablation therapy in the near future.   INTERVAL HISTORY: Devon Brady 61 y.o. male returns for regular follow up with his wife.  He reports feeling well.  He has been working full time.  He underwent total thyroidectomy without problem. He has mild neuropathy in the fingers due to past chemo.  However, this does not interfere with his activities of daily living such as typing and using utensils.   Patient denies fever, anorexia, weight loss, fatigue, headache, visual changes, confusion, drenching night sweats, palpable lymph node swelling, mucositis, odynophagia, dysphagia, nausea vomiting, jaundice, chest pain, palpitation, shortness of breath, dyspnea on exertion, productive cough, gum bleeding, epistaxis, hematemesis, hemoptysis, abdominal pain, abdominal swelling, early satiety, melena, hematochezia, hematuria, skin rash, spontaneous bleeding, joint swelling, joint pain, heat or cold intolerance, bowel bladder incontinence, back pain, focal motor weakness, depression.    Past Medical History  Diagnosis Date  . Hypertension   . Hyperlipidemia       takes Crestor nightly  . Multiple thyroid nodules   . Anemia   . Lymphocytosis   . Lymphadenopathy   . Hydronephrosis   . Renal insufficiency   . Dizziness     occasional for last week  . Hemorrhoid   . Sleep apnea     STOPBANG=5  . Hodgkin's lymphoma   . Neck swelling     PRIOR TO DIAGNOSIS  . Status post chemotherapy  06/11/11 - 11/13/11    ABVD WITH NEULASTA SUPPORT, VINBLASTIN REMOVED STARTING 6TH CYCLE DUE TO GRADE 2-3 NEUROPATHY  . Retroperitoneal mass   . 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 H/O colonoscopy 2013  . Complication of anesthesia     slow to wake up  . CAD (coronary artery disease)     takes Metoprolol daily  . TIA (transient ischemic attack)     in 2011  . Back pain     occasionally   . Pacemaker   . H/O hiatal hernia   . Hemorrhoid   . History of colon polyps   . Nocturia     Past Surgical History  Procedure Laterality Date  . Vasectomy  1980  . Bunionectomy  1978    right foot  . Cardiac catheterization  10/15/2009  . Portacath placement  06/10/2011    Procedure: INSERTION PORT-A-CATH;  Surgeon: Almond Lint, MD;  Location: [facility];  Service: General;  Laterality: Left;  subclavian   . Lymph node biopsy  06/07/11    RIGHT INGUINAL NODE: CLASSICAL HODGKIN'S LYMPHOMA, NODULAR SCLEROSIS TYPE  . Bone marrow aspirate,biopsy, and  clot  06/11/11    LEFT ILIAC CREAST  . Tonsillectomy      as a child  . Coronary artery bypass graft  10/17/2009    LIMA to LAD,SVG to Ramus,SVG to OM sequential to OM2, SVG to marginal of RCA  . Colonoscopy    . Esophagogastroduodenoscopy    . Thyroidectomy  03/31/2012    Procedure: THYROIDECTOMY;  Surgeon: Serena Colonel, MD;  Location: [facility] OR;  Service: ENT;  Laterality: N/A;    Current Outpatient Prescriptions  Medication Sig Dispense Refill  . aspirin EC 81 MG tablet Take 81 mg by mouth daily.      Marland Kitchen levothyroxine (SYNTHROID, LEVOTHROID) 200 MCG tablet Take 200 mcg by mouth daily.      . metoprolol  succinate (TOPROL-XL) 50 MG 24 hr tablet Take 1 tablet (50 mg total) by mouth daily before breakfast. Take with or immediately following a meal.  90 tablet  3  . OVER THE COUNTER MEDICATION Take 1 tablet by mouth daily. OTC Probiotic: JARRO-dophilus      . rosuvastatin (CRESTOR) 20 MG tablet Take 20 mg by mouth every evening.      . tadalafil (CIALIS) 20 MG tablet Take 1 tablet (20 mg total) by mouth daily as needed for erectile dysfunction.  9 tablet  0  . fluticasone (FLONASE) 50 MCG/ACT nasal spray Place 2 sprays into the nose daily as needed. For allergies      . nitroGLYCERIN (NITROSTAT) 0.4 MG SL tablet Place 0.4 mg under the tongue every 5 (five) minutes x 3 doses as needed. For chest pain      . [DISCONTINUED] citalopram (CELEXA) 20 MG tablet Take 1 tablet (20 mg total) by mouth daily.  30 tablet  1  . [DISCONTINUED] omeprazole (PRILOSEC OTC) 20 MG tablet Take 1 tablet (20 mg total) by mouth daily.  28 tablet  1   No current facility-administered medications for this visit.    ALLERGIES:  has No Known Allergies.  REVIEW OF SYSTEMS:  The rest of the 14-point review of system was negative.   Filed Vitals:   05/17/12 0842  BP: 136/87  Pulse: 65  Temp: 97.2 F (36.2 C)  Resp: 20   Wt Readings from Last 3 Encounters:  05/17/12 253 lb 9.6 oz (115.032 kg)  04/14/12 247 lb 12 oz (112.379 kg)  03/31/12 242 lb (109.77 kg)   ECOG Performance status: 0-1  PHYSICAL EXAMINATION:   General:  well-nourished man, in no acute distress.  Eyes:  no scleral icterus.  ENT:  There were no oropharyngeal lesions.  Neck showed total thyroidectomy scar that was well healed without bleeding, erythema, purulent discharge, pain on palpation.  Lymphatics:  Negative cervical, supraclavicular or axillary adenopathy.  Respiratory: lungs were clear bilaterally without wheezing or crackles.  Cardiovascular:  Regular rate and rhythm, S1/S2, without murmur, rub or gallop.  There was no pedal edema.  GI:  abdomen  was soft, flat, nontender, nondistended, without organomegaly.  Muscoloskeletal:  no spinal tenderness of palpation of vertebral spine.  Skin exam was without echymosis, petichae.  Neuro exam was nonfocal.  Patient was able to get on and off exam table without assistance.  Gait was normal.  Patient was alerted and oriented.  Attention was good.   Language was appropriate.  Mood was normal without depression.  Speech was not pressured.  Thought content was not tangential.    LABORATORY/RADIOLOGY DATA:  Lab Results  Component Value Date   WBC 5.9 05/15/2012  HGB 15.1 05/15/2012   HCT 45.6 05/15/2012   PLT 179 05/15/2012   GLUCOSE 106* 05/15/2012   CHOL 142 04/24/2012   TRIG 85.0 04/24/2012   HDL 42.90 04/24/2012   LDLCALC 82 04/24/2012   ALKPHOS 59 04/24/2012   ALT 17 04/24/2012   AST 23 04/24/2012   NA 140 05/15/2012   K 4.3 05/15/2012   CL 106 05/15/2012   CREATININE 1.7* 05/15/2012   BUN 17.3 05/15/2012   CO2 26 05/15/2012   PSA 0.73 12/13/2011   INR 1.08 06/10/2011   HGBA1C 6.2 12/04/2009   IMAGING:  I personally reviewed the following CT scan and showed the patient and his wife the images.   CT CHEST  Findings: Lungs/pleura: There is no pleural effusion identified.  No airspace consolidation or atelectasis identified. There is no  pulmonary nodule or mass identified.  Heart/Mediastinum: Status post median sternotomy and CABG  procedure. No pericardial effusion noted. There is no enlarged  mediastinal or hilar adenopathy.  Bones/Musculoskeletal: No axillary or supraclavicular adenopathy  noted. There are no aggressive lytic or sclerotic bone lesions  noted.  IMPRESSION:  1. No acute findings.  2. No mass or adenopathy.  3. Previous CABG procedure.  CT ABDOMEN AND PELVIS  Findings: There is no suspicious liver abnormality. The  gallbladder is within normal limits. No biliary dilatation.  Normal appearance of the pancreas. The spleen appears normal.  The adrenal glands are normal. Normal appearance  of both kidneys.  The urinary bladder appears normal for degree of distention. There  is prostate gland enlargement. Seminal vesicles are unremarkable.  Aortocaval lymph node measures 1.1 cm, image 58. This is unchanged  from previous exam. Periaortic lymph node has a short axis  measurement of the 0.9 cm, image 75. This is unchanged from  previous exam. Just below the bifurcation of the right common  iliac artery there is an iliac node which measures 7 mm, image 98.  This is unchanged from previous exam. There are no new or  enlarging lymph nodes within the upper abdomen or the pelvis. No  peritoneal nodule or mass identified.  The stomach and small bowel loops are normal. The appendix is  visualized and appears normal. Normal appearance of the colon.  Review of the visualized osseous structures is significant for mild  spondylosis. No aggressive lytic or sclerotic bone lesions.  IMPRESSION:  1. Stable appearance of borderline enlarged retroperitoneal lymph  nodes.  2. No new or progressive disease identified.    ASSESSMENT AND PLAN:   1. History of Hodgkin's lymphoma, stage IV, high risk: he is in remission.  I advised him to continue with noncontrast CT scan every 6 months until 10/2013 and then yearly thereafter.  We will certainly get surveillance CT scan sooner than the stated time frame if he has concerning symptoms.   2. Papillary thyroid cancer:  S/p total thyroidectomy; on Synthroid per Dr. Talmage Nap.  Pending radioactive iodine ablation in the near future as directed by Dr. Talmage Nap.   3. Hydronephrosis. His new baseline Cr is now around 1.5.   There is no worsening hydronephrosis on the most recent CT scan.  However, his Cr slightly increases. He follows with Dr. Kathrene Bongo.  Will continue to check his Cr every 6 months.   4. Hypertension. on metoprolol per PCP.   5. Hyperlipidemia.  on his Crestor per PCP.   6. Neuropathy: From chemo Vinblastin. Now it is grade 1 being off of  Vinblastine.   7.  Age-appropriate  cancer screening:  His colonoscopy in 2013 showed one benign polyp.  He is due for a repeat colonoscopy in 2018.   8.  Follow up: CT chest/abd/pel in about 6 months with return visit the day after.

## 2012-06-09 ENCOUNTER — Ambulatory Visit (HOSPITAL_BASED_OUTPATIENT_CLINIC_OR_DEPARTMENT_OTHER): Payer: BC Managed Care – PPO

## 2012-06-09 VITALS — BP 116/81 | HR 70 | Temp 97.8°F | Resp 18

## 2012-06-09 DIAGNOSIS — Z452 Encounter for adjustment and management of vascular access device: Secondary | ICD-10-CM

## 2012-06-09 DIAGNOSIS — C8190 Hodgkin lymphoma, unspecified, unspecified site: Secondary | ICD-10-CM

## 2012-06-09 MED ORDER — SODIUM CHLORIDE 0.9 % IJ SOLN
10.0000 mL | INTRAMUSCULAR | Status: DC | PRN
  Administered 2012-06-09: 10 mL via INTRAVENOUS
  Filled 2012-06-09: qty 10

## 2012-06-09 MED ORDER — HEPARIN SOD (PORK) LOCK FLUSH 100 UNIT/ML IV SOLN
500.0000 [IU] | Freq: Once | INTRAVENOUS | Status: AC
  Administered 2012-06-09: 500 [IU] via INTRAVENOUS
  Filled 2012-06-09: qty 5

## 2012-06-09 NOTE — Patient Instructions (Signed)
Call MD for problems 

## 2012-06-29 ENCOUNTER — Other Ambulatory Visit: Payer: Self-pay | Admitting: Internal Medicine

## 2012-07-21 ENCOUNTER — Ambulatory Visit (HOSPITAL_BASED_OUTPATIENT_CLINIC_OR_DEPARTMENT_OTHER): Payer: BC Managed Care – PPO

## 2012-07-21 ENCOUNTER — Telehealth: Payer: Self-pay | Admitting: Oncology

## 2012-07-21 VITALS — BP 128/88 | HR 71 | Temp 97.2°F

## 2012-07-21 DIAGNOSIS — C8190 Hodgkin lymphoma, unspecified, unspecified site: Secondary | ICD-10-CM

## 2012-07-21 DIAGNOSIS — Z452 Encounter for adjustment and management of vascular access device: Secondary | ICD-10-CM

## 2012-07-21 MED ORDER — SODIUM CHLORIDE 0.9 % IJ SOLN
10.0000 mL | INTRAMUSCULAR | Status: DC | PRN
  Administered 2012-07-21: 10 mL via INTRAVENOUS
  Filled 2012-07-21: qty 10

## 2012-07-21 MED ORDER — HEPARIN SOD (PORK) LOCK FLUSH 100 UNIT/ML IV SOLN
500.0000 [IU] | Freq: Once | INTRAVENOUS | Status: AC
  Administered 2012-07-21: 500 [IU] via INTRAVENOUS
  Filled 2012-07-21: qty 5

## 2012-07-21 MED ORDER — LIDOCAINE-PRILOCAINE 2.5-2.5 % EX CREA: TOPICAL_CREAM | CUTANEOUS | Status: DC | PRN

## 2012-07-21 NOTE — Patient Instructions (Signed)

## 2012-08-24 ENCOUNTER — Other Ambulatory Visit (HOSPITAL_COMMUNITY): Payer: Self-pay | Admitting: Endocrinology

## 2012-08-24 ENCOUNTER — Encounter: Payer: Self-pay | Admitting: Oncology

## 2012-08-24 DIAGNOSIS — C73 Malignant neoplasm of thyroid gland: Secondary | ICD-10-CM

## 2012-08-25 ENCOUNTER — Telehealth: Payer: Self-pay | Admitting: *Deleted

## 2012-08-25 NOTE — Telephone Encounter (Signed)
Pt left VM asking if Flush appt can be r/s to after 6/23.  He is having some Radioactive Injections for his Thyroid next week and says he was told to avoid people until after 6/23.   Called pt back and left VM informing him ok to r/s this appt.  Will cancel the Flush on 6/20 and he can call back to r/s after his procedures.  Also informed him if Nuc Med uses his PAC for procedure, then they will flush it, but if not call us back.

## 2012-08-28 ENCOUNTER — Encounter (HOSPITAL_COMMUNITY)
Admission: RE | Admit: 2012-08-28 | Discharge: 2012-08-28 | Disposition: A | Discharge status: Home / Self Care | Payer: BC Managed Care – PPO | Source: Ambulatory Visit | Attending: Endocrinology | Admitting: Endocrinology

## 2012-08-28 DIAGNOSIS — C73 Malignant neoplasm of thyroid gland: Secondary | ICD-10-CM | POA: Insufficient documentation

## 2012-08-28 MED ORDER — THYROTROPIN ALFA 1.1 MG IM SOLR
0.9000 mg | INTRAMUSCULAR | Status: AC
  Administered 2012-08-28: 0.9 mg via INTRAMUSCULAR

## 2012-08-29 ENCOUNTER — Encounter (HOSPITAL_COMMUNITY)
Admission: RE | Admit: 2012-08-29 | Discharge: 2012-08-29 | Disposition: A | Discharge status: Home / Self Care | Payer: BC Managed Care – PPO | Source: Ambulatory Visit | Attending: Endocrinology | Admitting: Endocrinology

## 2012-08-29 MED ORDER — THYROTROPIN ALFA 1.1 MG IM SOLR
0.9000 mg | INTRAMUSCULAR | Status: AC
  Administered 2012-08-29: 0.9 mg via INTRAMUSCULAR

## 2012-08-30 ENCOUNTER — Encounter (HOSPITAL_COMMUNITY)
Admission: RE | Admit: 2012-08-30 | Discharge: 2012-08-30 | Disposition: A | Discharge status: Home / Self Care | Payer: BC Managed Care – PPO | Source: Ambulatory Visit | Attending: Endocrinology | Admitting: Endocrinology

## 2012-08-30 MED ORDER — SODIUM IODIDE I 131 CAPSULE
70.0000 | Freq: Once | INTRAVENOUS | Status: AC | PRN
  Administered 2012-08-30: 76.2 via ORAL

## 2012-09-04 ENCOUNTER — Encounter (HOSPITAL_COMMUNITY)
Admission: RE | Admit: 2012-09-04 | Discharge: 2012-09-04 | Disposition: A | Discharge status: Home / Self Care | Payer: BC Managed Care – PPO | Source: Ambulatory Visit | Attending: Endocrinology | Admitting: Endocrinology

## 2012-09-04 DIAGNOSIS — C73 Malignant neoplasm of thyroid gland: Secondary | ICD-10-CM | POA: Insufficient documentation

## 2012-10-13 ENCOUNTER — Ambulatory Visit (HOSPITAL_BASED_OUTPATIENT_CLINIC_OR_DEPARTMENT_OTHER): Payer: BC Managed Care – PPO

## 2012-10-13 DIAGNOSIS — Z452 Encounter for adjustment and management of vascular access device: Secondary | ICD-10-CM

## 2012-10-13 DIAGNOSIS — Z95828 Presence of other vascular implants and grafts: Secondary | ICD-10-CM

## 2012-10-13 DIAGNOSIS — C8190 Hodgkin lymphoma, unspecified, unspecified site: Secondary | ICD-10-CM

## 2012-10-13 MED ORDER — HEPARIN SOD (PORK) LOCK FLUSH 100 UNIT/ML IV SOLN
500.0000 [IU] | Freq: Once | INTRAVENOUS | Status: AC
  Administered 2012-10-13: 500 [IU] via INTRAVENOUS
  Filled 2012-10-13: qty 5

## 2012-10-13 MED ORDER — SODIUM CHLORIDE 0.9 % IJ SOLN
10.0000 mL | INTRAMUSCULAR | Status: DC | PRN
  Administered 2012-10-13: 10 mL via INTRAVENOUS
  Filled 2012-10-13: qty 10

## 2012-10-27 ENCOUNTER — Ambulatory Visit (INDEPENDENT_AMBULATORY_CARE_PROVIDER_SITE_OTHER): Payer: BC Managed Care – PPO | Admitting: Cardiovascular Disease

## 2012-10-27 ENCOUNTER — Encounter: Payer: Self-pay | Admitting: Cardiovascular Disease

## 2012-10-27 VITALS — BP 127/80 | HR 77 | Ht 70.0 in | Wt 246.8 lb

## 2012-10-27 DIAGNOSIS — I2510 Atherosclerotic heart disease of native coronary artery without angina pectoris: Secondary | ICD-10-CM

## 2012-10-27 NOTE — Progress Notes (Signed)
History of Present Illness: 62 yo AAM with history of CAD s/p CABG August 2011, HTN, hyperlipidemia, stage IV Hodgkins lymphoma who is here today for cardiac follow up. He has been followed in the past by Dr. Juanda Chance. I saw him for the first time in July 2012. Cath in August 2011 with severe triple vessel disease. He underwent 5V CABG August 2011 per Dr. Cornelius Moras with LIMA to LAD, SVG to Ramus, SVG sequential to OM1/OM2, SVG to RV marginal. He was discovered to have thyroid cancer this year and he had thyroidectomy this spring.   He tells me that he has been doing well. No exertional chest pains or SOB. No palpitations.   Primary Care Physician: Dr. Laury Axon  Last Lipid Profile:Lipid Panel     Component Value Date/Time   CHOL 142 04/24/2012 0909   TRIG 85.0 04/24/2012 0909   HDL 42.90 04/24/2012 0909   CHOLHDL 3 04/24/2012 0909   VLDL 17.0 04/24/2012 0909   LDLCALC 82 04/24/2012 0909     Past Medical History  Diagnosis Date  . Hypertension   . Hyperlipidemia     takes Crestor nightly  . Anemia   . Hydronephrosis   . Renal insufficiency   . Dizziness     occasional for last week  . Hemorrhoid   . Sleep apnea     STOPBANG=5  . Hodgkin's lymphoma   . 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 H/O colonoscopy 2013  . Complication of anesthesia     slow to wake up  . CAD (coronary artery disease)     takes Metoprolol daily  . TIA (transient ischemic attack)     in 2011  . Back pain     occasionally   . Pacemaker   . H/O hiatal hernia   . Hemorrhoid   . History of colon polyps   . Nocturia   . 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 Vasectomy  1980  . Bunionectomy  1978    right foot  . Cardiac catheterization  10/15/2009  . Portacath placement  06/10/2011    Procedure: INSERTION PORT-A-CATH;  Surgeon: Almond Lint, MD;  Location: [facility];  Service: General;  Laterality: Left;  subclavian   . Lymph node biopsy  06/07/11    RIGHT INGUINAL NODE: CLASSICAL HODGKIN'S LYMPHOMA, NODULAR SCLEROSIS TYPE  . Bone marrow aspirate,biopsy, and clot  06/11/11    LEFT ILIAC CREAST  . Tonsillectomy      as a child  . Coronary artery bypass graft  10/17/2009    LIMA to LAD,SVG to Ramus,SVG to OM sequential to OM2, SVG to marginal of RCA  . Colonoscopy    . Esophagogastroduodenoscopy    . Thyroidectomy  03/31/2012    Procedure: THYROIDECTOMY;  Surgeon: Serena Colonel, MD;  Location: [facility] OR;  Service: ENT;  Laterality: N/A;    Current Outpatient Prescriptions  Medication Sig Dispense Refill  . aspirin EC 81 MG tablet Take 81 mg by mouth daily.      Marland Kitchen levothyroxine (SYNTHROID, LEVOTHROID) 200 MCG tablet Take 200 mcg by mouth daily.      Marland Kitchen lidocaine-prilocaine (EMLA) cream Apply topically as needed. Apply to port a cath one hour before needle stick as needed.  30 g  2  . metoprolol succinate (TOPROL-XL) 50 MG 24 hr tablet TAKE 1 TABLET EVERY DAY TAKE BEFORE BREAKFAST.TAKE WITH OR  IMMEDIATELY FOLLOWING A MEAL  90 tablet  3  . nitroGLYCERIN (NITROSTAT) 0.4 MG SL tablet Place 0.4 mg under the tongue every 5 (five) minutes x 3 doses as needed. For chest pain      . OVER THE COUNTER MEDICATION Take 1 tablet by mouth daily. OTC Probiotic: JARRO-dophilus      . rosuvastatin (CRESTOR) 20 MG tablet Take 20 mg by mouth every evening.      . tadalafil (CIALIS) 20 MG tablet Take 1 tablet (20 mg total) by mouth daily as needed for erectile dysfunction.  9 tablet  0  . fluticasone (FLONASE) 50 MCG/ACT nasal spray Place 2 sprays into the nose daily as needed. For allergies      . [DISCONTINUED] citalopram (CELEXA) 20 MG tablet Take 1 tablet (20 mg total) by mouth daily.  30 tablet  1  . [DISCONTINUED] omeprazole (PRILOSEC OTC) 20 MG tablet Take 1 tablet (20 mg total) by mouth daily.  28 tablet  1   No current facility-administered medications for this visit.    No Known Allergies  History   Social History  .  Marital Status: Married    Spouse Name: N/A    Number of Children: 2  . Years of Education: N/A   Occupational History  . 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    Social History Main Topics  . Smoking status: Never Smoker   . Smokeless tobacco: Never Used  . Alcohol Use: 0.6 oz/week    1 Cans of beer per week     Comment: occasionally  . Drug Use: No  . Sexual Activity: Yes   Other Topics Concern  . Not on file   Social History Narrative  . No narrative on file    Family History  Problem Relation Age of Onset  . Alcohol abuse    . Arthritis Mother   . Diabetes Father   . Coronary artery disease Father 33    dscd  . Kidney disease Father   . Prostate cancer Paternal Uncle     Great  . Hypertension Mother   . Hyperlipidemia    . Hypertension Sister   . Hypertension Sister     2nd Sister    Review of Systems:  As stated in the HPI and otherwise negative.   BP 127/80  Pulse 77  Ht 5\' 10"  (1.778 m)  Wt 246 lb 12.8 oz (111.948 kg)  BMI 35.41 kg/m2  Physical Examination: General: Well developed, well nourished, NAD HEENT: OP clear, mucus membranes moist SKIN: warm, dry. No rashes. Neuro: No focal deficits Musculoskeletal: Muscle strength 5/5 all ext Psychiatric: Mood and affect normal Neck: No JVD, no carotid bruits, no thyromegaly, no lymphadenopathy. Lungs:Clear bilaterally, no wheezes, rhonci, crackles Cardiovascular: Regular rate and rhythm. No murmurs, gallops or rubs. Abdomen:Soft. Bowel sounds present. Non-tender.  Extremities: No lower extremity edema. Pulses are 2 + in the bilateral DP/PT.  EKG: NSR, rate 76 bpm  Assessment and Plan:   1. CAD: Stable. He has no chest pain. BP is well controlled. Lipids well controlled.  Will continue ASA, beta blocker, statin. Repeat lipids and LFTs in February 2015. I will see him in August 2015.

## 2012-10-27 NOTE — Patient Instructions (Addendum)
Your physician wants you to follow-up in:  12 months. You will receive a reminder letter in the mail two months in advance. If you don't receive a letter, please call our office to schedule the follow-up appointment.  Your physician recommends that you return for fasting lab work in:  February 2015. The lab opens at 7:30 every week day

## 2012-11-17 ENCOUNTER — Ambulatory Visit (HOSPITAL_COMMUNITY)
Admission: RE | Admit: 2012-11-17 | Discharge: 2012-11-17 | Disposition: A | Discharge status: Home / Self Care | Payer: BC Managed Care – PPO | Source: Ambulatory Visit | Attending: Oncology | Admitting: Oncology

## 2012-11-17 ENCOUNTER — Other Ambulatory Visit (HOSPITAL_BASED_OUTPATIENT_CLINIC_OR_DEPARTMENT_OTHER): Payer: BC Managed Care – PPO | Admitting: Lab

## 2012-11-17 ENCOUNTER — Encounter (HOSPITAL_COMMUNITY): Payer: Self-pay

## 2012-11-17 DIAGNOSIS — C8190 Hodgkin lymphoma, unspecified, unspecified site: Secondary | ICD-10-CM

## 2012-11-17 DIAGNOSIS — M47817 Spondylosis without myelopathy or radiculopathy, lumbosacral region: Secondary | ICD-10-CM | POA: Insufficient documentation

## 2012-11-17 DIAGNOSIS — I700 Atherosclerosis of aorta: Secondary | ICD-10-CM | POA: Insufficient documentation

## 2012-11-17 DIAGNOSIS — Z951 Presence of aortocoronary bypass graft: Secondary | ICD-10-CM | POA: Insufficient documentation

## 2012-11-17 LAB — COMPREHENSIVE METABOLIC PANEL (CC13)
Albumin: 3.7 g/dL (ref 3.5–5.0)
BUN: 16.6 mg/dL (ref 7.0–26.0)
CO2: 26 mEq/L (ref 22–29)
Calcium: 9.7 mg/dL (ref 8.4–10.4)
Chloride: 106 mEq/L (ref 98–109)
Glucose: 123 mg/dl (ref 70–140)
Potassium: 4.7 mEq/L (ref 3.5–5.1)
Sodium: 140 mEq/L (ref 136–145)
Total Protein: 7.2 g/dL (ref 6.4–8.3)

## 2012-11-17 LAB — CBC WITH DIFFERENTIAL/PLATELET
Basophils Absolute: 0 10*3/uL (ref 0.0–0.1)
Eosinophils Absolute: 0.1 10*3/uL (ref 0.0–0.5)
HCT: 46.7 % (ref 38.4–49.9)
HGB: 15.5 g/dL (ref 13.0–17.1)
MCH: 29.3 pg (ref 27.2–33.4)
MONO#: 0.5 10*3/uL (ref 0.1–0.9)
NEUT#: 3.9 10*3/uL (ref 1.5–6.5)
NEUT%: 59.4 % (ref 39.0–75.0)
RDW: 15.7 % — ABNORMAL HIGH (ref 11.0–14.6)
WBC: 6.6 10*3/uL (ref 4.0–10.3)
lymph#: 2 10*3/uL (ref 0.9–3.3)

## 2012-11-17 MED ORDER — IOHEXOL 300 MG/ML  SOLN
50.0000 mL | Freq: Once | INTRAMUSCULAR | Status: AC | PRN
  Administered 2012-11-17: 50 mL via ORAL

## 2012-11-20 ENCOUNTER — Ambulatory Visit (HOSPITAL_BASED_OUTPATIENT_CLINIC_OR_DEPARTMENT_OTHER): Payer: BC Managed Care – PPO

## 2012-11-20 ENCOUNTER — Encounter: Payer: Self-pay | Admitting: Oncology

## 2012-11-20 ENCOUNTER — Ambulatory Visit (HOSPITAL_BASED_OUTPATIENT_CLINIC_OR_DEPARTMENT_OTHER): Payer: BC Managed Care – PPO | Admitting: Oncology

## 2012-11-20 ENCOUNTER — Telehealth: Payer: Self-pay | Admitting: Oncology

## 2012-11-20 VITALS — BP 144/89 | HR 71 | Temp 98.6°F | Resp 18 | Ht 70.0 in | Wt 258.1 lb

## 2012-11-20 DIAGNOSIS — C8190 Hodgkin lymphoma, unspecified, unspecified site: Secondary | ICD-10-CM

## 2012-11-20 DIAGNOSIS — I10 Essential (primary) hypertension: Secondary | ICD-10-CM

## 2012-11-20 DIAGNOSIS — Z452 Encounter for adjustment and management of vascular access device: Secondary | ICD-10-CM

## 2012-11-20 DIAGNOSIS — C73 Malignant neoplasm of thyroid gland: Secondary | ICD-10-CM

## 2012-11-20 DIAGNOSIS — G620 Drug-induced polyneuropathy: Secondary | ICD-10-CM

## 2012-11-20 DIAGNOSIS — N1330 Unspecified hydronephrosis: Secondary | ICD-10-CM

## 2012-11-20 MED ORDER — SODIUM CHLORIDE 0.9 % IJ SOLN
10.0000 mL | INTRAMUSCULAR | Status: DC | PRN
  Administered 2012-11-20: 10 mL via INTRAVENOUS
  Filled 2012-11-20: qty 10

## 2012-11-20 MED ORDER — HEPARIN SOD (PORK) LOCK FLUSH 100 UNIT/ML IV SOLN
500.0000 [IU] | Freq: Once | INTRAVENOUS | Status: AC
  Administered 2012-11-20: 500 [IU] via INTRAVENOUS
  Filled 2012-11-20: qty 5

## 2012-11-20 NOTE — Telephone Encounter (Signed)
v and printed appt sched and avs for pt for Nov, Dec and feb ...pt preferrs water based.

## 2012-11-20 NOTE — Progress Notes (Signed)
[facility]  Telephone:([phone]) [phone] Fax:(336) [phone]   OFFICE PROGRESS NOTE   Cc:  Sanda Linger, MD  DIAGNOSIS, PAST THERAPY, AND CURRENT THERAPY:   1.  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   He received d1, d15, ABVD with Neulasta injection the day after. Plan is for minimal of 6 cycles total. 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  He was on chemo between 06/11/2011 and 11/13/2011.  Patient deferred consolidative radiation after meeting with Rad Onc.  He has been on watchful observation for this.   2.  Papillary thyroid cancer:  S/p total thyroidectomy with Dr. Serena Colonel on 03/31/2012.    INTERVAL HISTORY: Devon Brady 62 y.o. male returns for regular follow up with his wife.  He reports feeling well.  He has been working full time.  He has mild neuropathy in the fingers due to past chemo.  However, this does not interfere with his activities of daily living such as typing and using utensils.   Patient denies fever, anorexia, weight loss, fatigue, headache, visual changes, confusion, drenching night sweats, palpable lymph node swelling, mucositis, odynophagia, dysphagia, nausea vomiting, jaundice, chest pain, palpitation, shortness of breath, dyspnea on exertion, productive cough, gum bleeding, epistaxis, hematemesis, hemoptysis, abdominal pain, abdominal swelling, early satiety, melena, hematochezia, hematuria, skin rash, spontaneous bleeding, joint swelling, joint pain, heat or cold intolerance, bowel bladder incontinence, back pain, focal motor weakness, depression.    Past Medical History  Diagnosis Date  . Hypertension   . Hyperlipidemia     takes Crestor nightly  . Anemia   . Hydronephrosis   . Renal insufficiency   . Dizziness     occasional for last week  .  Hemorrhoid   . Sleep apnea     STOPBANG=5  . Hodgkin's lymphoma   . 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 H/O colonoscopy 2013  . Complication of anesthesia     slow to wake up  . CAD (coronary artery disease)     takes Metoprolol daily  . TIA (transient ischemic attack)     in 2011  . Back pain     occasionally   . Pacemaker   . H/O hiatal hernia   . Hemorrhoid   . History of colon polyps   . Nocturia   . 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 Vasectomy  1980  . Bunionectomy  1978    right foot  . Cardiac catheterization  10/15/2009  . Portacath placement  06/10/2011    Procedure: INSERTION PORT-A-CATH;  Surgeon: Almond Lint, MD;  Location: [facility];  Service: General;  Laterality: Left;  subclavian   . Lymph node biopsy  06/07/11    RIGHT INGUINAL NODE: CLASSICAL HODGKIN'S LYMPHOMA, NODULAR SCLEROSIS TYPE  . Bone marrow aspirate,biopsy, and clot  06/11/11    LEFT ILIAC CREAST  . Tonsillectomy      as a child  . Coronary artery bypass graft  10/17/2009    LIMA to LAD,SVG to Ramus,SVG to OM sequential to OM2, SVG to marginal of RCA  . Colonoscopy    . Esophagogastroduodenoscopy    . Thyroidectomy  03/31/2012    Procedure: THYROIDECTOMY;  Surgeon: Serena Colonel, MD;  Location: [facility] OR;  Service: ENT;  Laterality: N/A;    Current Outpatient Prescriptions  Medication Sig Dispense Refill  . aspirin EC 81 MG tablet Take 81 mg by mouth daily.      . fluticasone (FLONASE) 50 MCG/ACT nasal spray Place 2 sprays into the nose daily as needed. For allergies      . levothyroxine (SYNTHROID, LEVOTHROID) 200 MCG tablet Take 200 mcg by mouth daily.      Marland Kitchen lidocaine-prilocaine (EMLA) cream Apply topically as needed. Apply to port a cath one hour before needle stick as needed.  30 g  2  . metoprolol succinate (TOPROL-XL) 50 MG 24 hr tablet TAKE 1 TABLET EVERY DAY TAKE BEFORE BREAKFAST.TAKE WITH OR  IMMEDIATELY FOLLOWING A MEAL  90 tablet  3  . nitroGLYCERIN (NITROSTAT) 0.4 MG SL tablet Place 0.4 mg under the tongue every 5 (five) minutes x 3 doses as needed. For chest pain      . OVER THE COUNTER MEDICATION Take 1 tablet by mouth daily. OTC Probiotic: JARRO-dophilus      . rosuvastatin (CRESTOR) 20 MG tablet Take 20 mg by mouth every evening.      . tadalafil (CIALIS) 20 MG tablet Take 1 tablet (20 mg total) by mouth daily as needed for erectile dysfunction.  9 tablet  0  . [DISCONTINUED] citalopram (CELEXA) 20 MG tablet Take 1 tablet (20 mg total) by mouth daily.  30 tablet  1  . [DISCONTINUED] omeprazole (PRILOSEC OTC) 20 MG tablet Take 1 tablet (20 mg total) by mouth daily.  28 tablet  1   No current facility-administered medications for this visit.    ALLERGIES:  has No Known Allergies.  REVIEW OF SYSTEMS:  The rest of the 14-point review of system was negative.   Filed Vitals:   11/20/12 0937  BP: 144/89  Pulse: 71  Temp: 98.6 F (37 C)  Resp: 18   Wt Readings from Last 3 Encounters:  11/20/12 258 lb 1.6 oz (117.073 kg)  10/27/12 246 lb 12.8 oz (111.948 kg)  05/17/12 253 lb 9.6 oz (115.032 kg)   ECOG Performance status: 0-1  PHYSICAL EXAMINATION:   General:  well-nourished man, in no acute distress.  Eyes:  no scleral icterus.  ENT:  There were no oropharyngeal lesions.  Neck showed total thyroidectomy scar that was well healed without bleeding, erythema, purulent discharge, pain on palpation.  Lymphatics:  Negative cervical, supraclavicular or axillary adenopathy.  Respiratory: lungs were clear bilaterally without wheezing or crackles.  Cardiovascular:  Regular rate and rhythm, S1/S2, without murmur, rub or gallop.  There was no pedal edema.  GI:  abdomen was soft, flat, nontender, nondistended, without organomegaly.  Muscoloskeletal:  no spinal tenderness of palpation of vertebral spine.  Skin exam was without echymosis, petichae.  Neuro exam was nonfocal.  Patient was  able to get on and off exam table without assistance.  Gait was normal.  Patient was alerted and oriented.  Attention was good.   Language was appropriate.  Mood was normal without depression.  Speech was not pressured.  Thought content was not tangential.    LABORATORY/RADIOLOGY DATA:  Lab Results  Component Value Date   WBC 6.6 11/17/2012   HGB 15.5 11/17/2012   HCT 46.7 11/17/2012   PLT 192 11/17/2012   GLUCOSE 123 11/17/2012   CHOL 142 04/24/2012   TRIG 85.0 04/24/2012   HDL 42.90 04/24/2012   LDLCALC 82 04/24/2012   ALKPHOS 68 11/17/2012  ALT 15 11/17/2012   AST 20 11/17/2012   NA 140 11/17/2012   K 4.7 11/17/2012   CL 106 05/15/2012   CREATININE 1.6* 11/17/2012   BUN 16.6 11/17/2012   CO2 26 11/17/2012   PSA 0.73 12/13/2011   INR 1.08 06/10/2011   HGBA1C 6.2 12/04/2009   IMAGING:    *RADIOLOGY REPORT*  Clinical Data: Hodgkin's lymphoma  CT CHEST, ABDOMEN AND PELVIS WITHOUT CONTRAST  Technique: Multidetector CT imaging of the chest, abdomen and  pelvis was performed following the standard protocol without IV  contrast.  Comparison: 05/25/2012  CT CHEST  Findings: No pleural effusion identified. The no suspicious  pulmonary nodule or mass noted.  The patient is status post median sternotomy and CABG. The heart  size appears normal. There is no pericardial effusion. No  mediastinal or hilar adenopathy.  No axillary or supraclavicular adenopathy.  Review of the visualized osseous structures is unremarkable. No  aggressive lytic or sclerotic bone lesions identified.  IMPRESSION:  1. No mass or adenopathy noted.  CT ABDOMEN AND PELVIS  Findings: No suspicious liver abnormalities identified. The  gallbladder appears normal. No biliary dilatation. Normal  appearance of the pancreas. The spleen is unremarkable. Normal  appearance of the adrenal glands. The right kidney is normal. The  left kidney is also normal. The urinary bladder is unremarkable.  The prostate gland and seminal vesicles are  unremarkable.  Calcified atherosclerotic disease affects the abdominal aorta.  There is no aneurysm. Mild hazy soft tissue stranding within the  retroperitoneum is again identified and appears similar to previous  exam. Periaortic lymph node is prominent measuring 9 mm, image  75/series 2. This is compared with 10 mm previously. Slightly  more cranial is a 9 mm periaortic lymph node, image 67/series 2.  This is stable from previous exam. No new or enlarging abdominal  or pelvic lymph nodes. No mass identified. There is no free fluid  or abnormal fluid collections identified within the abdomen or  pelvis. The stomach and the small bowel loops are unremarkable.  Normal appearance of the appendix. The proximal colon is normal.  The distal colon is also unremarkable.  Review of the visualized osseous structures is significant for mild  lumbar spondylosis.  IMPRESSION:  1. No acute findings.  2. Stable appearance of prominent (nonpathologically enlarged)  than retroperitoneal lymph nodes. No new or progressive disease  identified.  Original Report Authenticated By: Signa Kell, M.D.     ASSESSMENT AND PLAN:   1. History of Hodgkin's lymphoma, stage IV, high risk: he is in remission.  I advised him to continue with noncontrast CT scan every 6 months until 10/2013 and then yearly thereafter.  We will certainly get surveillance CT scan sooner than the stated time frame if he has concerning symptoms.   2. Papillary thyroid cancer:  S/p total thyroidectomy; on Synthroid per Dr. Talmage Nap.    3. Hydronephrosis. His new baseline Cr is now around 1.5.   There is no worsening hydronephrosis on the most recent CT scan.  However, his Cr slightly increases. He follows with Dr. Kathrene Bongo.  Will continue to check his Cr every 6 months.   4. Hypertension. on metoprolol per PCP.   5. Hyperlipidemia.  on his Crestor per PCP.   6. Neuropathy: From chemo Vinblastin. Now it is grade 1 being off of  Vinblastine.   7.  Age-appropriate cancer screening:  His colonoscopy in 2013 showed one benign polyp.  He is due for a repeat colonoscopy  in 2018.   8.  Follow up: CT chest/abd/pel in about 6 months with return visit the day after.

## 2012-11-22 ENCOUNTER — Ambulatory Visit: Payer: BC Managed Care – PPO | Admitting: Internal Medicine

## 2012-11-22 ENCOUNTER — Encounter: Payer: Self-pay | Admitting: Internal Medicine

## 2012-11-22 ENCOUNTER — Other Ambulatory Visit (INDEPENDENT_AMBULATORY_CARE_PROVIDER_SITE_OTHER): Payer: BC Managed Care – PPO

## 2012-11-22 ENCOUNTER — Ambulatory Visit (INDEPENDENT_AMBULATORY_CARE_PROVIDER_SITE_OTHER): Payer: BC Managed Care – PPO | Admitting: Internal Medicine

## 2012-11-22 VITALS — BP 110/82 | HR 71 | Temp 98.5°F | Resp 16 | Ht 70.5 in | Wt 257.0 lb

## 2012-11-22 DIAGNOSIS — R7309 Other abnormal glucose: Secondary | ICD-10-CM

## 2012-11-22 DIAGNOSIS — Z0000 Encounter for general adult medical examination without abnormal findings: Secondary | ICD-10-CM

## 2012-11-22 DIAGNOSIS — E890 Postprocedural hypothyroidism: Secondary | ICD-10-CM

## 2012-11-22 DIAGNOSIS — N289 Disorder of kidney and ureter, unspecified: Secondary | ICD-10-CM

## 2012-11-22 DIAGNOSIS — E118 Type 2 diabetes mellitus with unspecified complications: Secondary | ICD-10-CM | POA: Insufficient documentation

## 2012-11-22 DIAGNOSIS — Z23 Encounter for immunization: Secondary | ICD-10-CM

## 2012-11-22 LAB — LIPID PANEL
HDL: 43.9 mg/dL (ref >39.00)
Total CHOL/HDL Ratio: 4
Triglycerides: 69 mg/dL (ref 0.0–149.0)
VLDL: 13.8 mg/dL (ref 0.0–40.0)

## 2012-11-22 LAB — PSA: PSA: 1.29 ng/mL (ref 0.10–4.00)

## 2012-11-22 NOTE — Progress Notes (Signed)
Subjective:    Patient ID: Devon Brady, male    DOB: 04/16/1950, 62 y.o.   MRN: [id number]  Thyroid Problem Presents for follow-up visit. Symptoms include weight gain. Patient reports no anxiety, cold intolerance, constipation, depressed mood, diaphoresis, diarrhea, dry skin, fatigue, hair loss, heat intolerance, hoarse voice, leg swelling, nail problem, palpitations, tremors, visual change or weight loss. The symptoms have been stable. Past treatments include levothyroxine. The treatment provided moderate relief. Prior procedures include thyroidectomy. His past medical history is significant for diabetes and obesity. There is no history of atrial fibrillation, dementia, Graves' ophthalmopathy, heart failure, hyperlipidemia or neuropathy.      Review of Systems  Constitutional: Positive for weight gain and unexpected weight change. Negative for fever, chills, weight loss, diaphoresis, activity change, appetite change and fatigue.  HENT: Negative.  Negative for sore throat, hoarse voice, facial swelling, trouble swallowing, neck pain, neck stiffness and voice change.   Eyes: Negative.   Respiratory: Negative.  Negative for apnea, cough, choking, chest tightness, shortness of breath, wheezing and stridor.   Cardiovascular: Negative.  Negative for chest pain, palpitations and leg swelling.  Gastrointestinal: Negative.  Negative for nausea, vomiting, abdominal pain, diarrhea, constipation, abdominal distention, anal bleeding and rectal pain.  Endocrine: Negative.  Negative for cold intolerance, heat intolerance, polydipsia, polyphagia and polyuria.  Genitourinary: Negative.   Musculoskeletal: Negative.   Skin: Negative.   Allergic/Immunologic: Negative.   Neurological: Negative.  Negative for dizziness, tremors, syncope, facial asymmetry, speech difficulty, weakness, light-headedness and numbness.  Hematological: Negative.  Negative for adenopathy. Does not bruise/bleed easily.   Psychiatric/Behavioral: Negative.        Objective:   Physical Exam  Vitals reviewed. Constitutional: He is oriented to person, place, and time. He appears well-developed and well-nourished. No distress.  HENT:  Head: Normocephalic and atraumatic.  Mouth/Throat: Oropharynx is clear and moist. No oropharyngeal exudate.  Eyes: Conjunctivae are normal. Right eye exhibits no discharge. Left eye exhibits no discharge. No scleral icterus.  Neck: Normal range of motion. Neck supple. No JVD present. No tracheal deviation present. No thyromegaly present.  Cardiovascular: Normal rate, regular rhythm, normal heart sounds and intact distal pulses.  Exam reveals no gallop and no friction rub.   No murmur heard. Pulmonary/Chest: Effort normal and breath sounds normal. No stridor. No respiratory distress. He has no wheezes. He has no rales. He exhibits no tenderness.  Abdominal: Soft. Bowel sounds are normal. He exhibits no distension and no mass. There is no tenderness. There is no rebound and no guarding. Hernia confirmed negative in the right inguinal area and confirmed negative in the left inguinal area.  Genitourinary: Prostate normal, testes normal and penis normal. Rectal exam shows external hemorrhoid. Rectal exam shows no internal hemorrhoid, no fissure, no mass, no tenderness and anal tone normal. Guaiac negative stool. Prostate is not enlarged and not tender. Right testis shows no mass, no swelling and no tenderness. Right testis is descended. Left testis shows no mass, no swelling and no tenderness. Left testis is descended. Circumcised. No penile erythema or penile tenderness. No discharge found.  Musculoskeletal: Normal range of motion. He exhibits no edema and no tenderness.  Lymphadenopathy:    He has no cervical adenopathy.       Right: No inguinal adenopathy present.       Left: No inguinal adenopathy present.  Neurological: He is oriented to person, place, and time.  Skin: Skin is warm  and dry. No rash noted. He is not diaphoretic. No erythema.  No pallor.  Psychiatric: He has a normal mood and affect. His behavior is normal. Judgment and thought content normal.     Lab Results  Component Value Date   WBC 6.6 11/17/2012   HGB 15.5 11/17/2012   HCT 46.7 11/17/2012   PLT 192 11/17/2012   GLUCOSE 123 11/17/2012   CHOL 142 04/24/2012   TRIG 85.0 04/24/2012   HDL 42.90 04/24/2012   LDLCALC 82 04/24/2012   ALT 15 11/17/2012   AST 20 11/17/2012   NA 140 11/17/2012   K 4.7 11/17/2012   CL 106 05/15/2012   CREATININE 1.6* 11/17/2012   BUN 16.6 11/17/2012   CO2 26 11/17/2012   TSH 0.16* 12/10/2009   PSA 0.73 12/13/2011   INR 1.08 06/10/2011   HGBA1C 6.2 12/04/2009       Assessment & Plan:

## 2012-11-22 NOTE — Assessment & Plan Note (Signed)
He will work on his lifestyle modifications to treat this

## 2012-11-22 NOTE — Assessment & Plan Note (Signed)
Exam done Vaccines were updated Labs ordered Pt ed material was given 

## 2012-11-22 NOTE — Assessment & Plan Note (Signed)
I will check his TSH and will adjust his dose if needed 

## 2012-11-22 NOTE — Patient Instructions (Signed)
Health Maintenance, Males A healthy lifestyle and preventative care can promote health and wellness.  Maintain regular health, dental, and eye exams.  Eat a healthy diet. Foods like vegetables, fruits, whole grains, low-fat dairy products, and lean protein foods contain the nutrients you need without too many calories. Decrease your intake of foods high in solid fats, added sugars, and salt. Get information about a proper diet from your caregiver, if necessary.  Regular physical exercise is one of the most important things you can do for your health. Most adults should get at least 150 minutes of moderate-intensity exercise (any activity that increases your heart rate and causes you to sweat) each week. In addition, most adults need muscle-strengthening exercises on 2 or more days a week.   Maintain a healthy weight. The body mass index (BMI) is a screening tool to identify possible weight problems. It provides an estimate of body fat based on height and weight. Your caregiver can help determine your BMI, and can help you achieve or maintain a healthy weight. For adults 20 years and older:  A BMI below 18.5 is considered underweight.  A BMI of 18.5 to 24.9 is normal.  A BMI of 25 to 29.9 is considered overweight.  A BMI of 30 and above is considered obese.  Maintain normal blood lipids and cholesterol by exercising and minimizing your intake of saturated fat. Eat a balanced diet with plenty of fruits and vegetables. Blood tests for lipids and cholesterol should begin at age 20 and be repeated every 5 years. If your lipid or cholesterol levels are high, you are over 50, or you are a high risk for heart disease, you may need your cholesterol levels checked more frequently.Ongoing high lipid and cholesterol levels should be treated with medicines, if diet and exercise are not effective.  If you smoke, find out from your caregiver how to quit. If you do not use tobacco, do not start.  If you  choose to drink alcohol, do not exceed 2 drinks per day. One drink is considered to be 12 ounces (355 mL) of beer, 5 ounces (148 mL) of wine, or 1.5 ounces (44 mL) of liquor.  Avoid use of street drugs. Do not share needles with anyone. Ask for help if you need support or instructions about stopping the use of drugs.  High blood pressure causes heart disease and increases the risk of stroke. Blood pressure should be checked at least every 1 to 2 years. Ongoing high blood pressure should be treated with medicines if weight loss and exercise are not effective.  If you are 45 to 61 years old, ask your caregiver if you should take aspirin to prevent heart disease.  Diabetes screening involves taking a blood sample to check your fasting blood sugar level. This should be done once every 3 years, after age 45, if you are within normal weight and without risk factors for diabetes. Testing should be considered at a younger age or be carried out more frequently if you are overweight and have at least 1 risk factor for diabetes.  Colorectal cancer can be detected and often prevented. Most routine colorectal cancer screening begins at the age of 50 and continues through age 75. However, your caregiver may recommend screening at an earlier age if you have risk factors for colon cancer. On a yearly basis, your caregiver may provide home test kits to check for hidden blood in the stool. Use of a small camera at the end of a tube,   to directly examine the colon (sigmoidoscopy or colonoscopy), can detect the earliest forms of colorectal cancer. Talk to your caregiver about this at age 57, when routine screening begins. Direct examination of the colon should be repeated every 5 to 10 years through age 84, unless early forms of pre-cancerous polyps or small growths are found.  Hepatitis C blood testing is recommended for all people born from [birth year] through 1964 and any individual with known risks for hepatitis C.  Healthy  men should no longer receive prostate-specific antigen (PSA) blood tests as part of routine cancer screening. Consult with your caregiver about prostate cancer screening.  Testicular cancer screening is not recommended for adolescents or adult males who have no symptoms. Screening includes self-exam, caregiver exam, and other screening tests. Consult with your caregiver about any symptoms you have or any concerns you have about testicular cancer.  Practice safe sex. Use condoms and avoid high-risk sexual practices to reduce the spread of sexually transmitted infections (STIs).  Use sunscreen with a sun protection factor (SPF) of 30 or greater. Apply sunscreen liberally and repeatedly throughout the day. You should seek shade when your shadow is shorter than you. Protect yourself by wearing long sleeves, pants, a wide-brimmed hat, and sunglasses year round, whenever you are outdoors.  Notify your caregiver of new moles or changes in moles, especially if there is a change in shape or color. Also notify your caregiver if a mole is larger than the size of a pencil eraser.  A one-time screening for abdominal aortic aneurysm (AAA) and surgical repair of large AAAs by sound wave imaging (ultrasonography) is recommended for ages 10 to 5 years who are current or former smokers.  Stay current with your immunizations. Document Released: 08/28/2007 Document Revised: 05/24/2011 Document Reviewed: 07/27/2010 [facility] Patient Information 2014 [place], [place]. Hypothyroidism The thyroid is a large gland located in the lower front of your neck. The thyroid gland helps control metabolism. Metabolism is how your body handles food. It controls metabolism with the hormone thyroxine. When this gland is underactive (hypothyroid), it produces too little hormone.  CAUSES These include:   Absence or destruction of thyroid tissue.  Goiter due to iodine deficiency.  Goiter due to medications.  Congenital defects  (since birth).  Problems with the pituitary. This causes a lack of TSH (thyroid stimulating hormone). This hormone tells the thyroid to turn out more hormone. SYMPTOMS  Lethargy (feeling as though you have no energy)  Cold intolerance  Weight gain (in spite of normal food intake)  Dry skin  Coarse hair  Menstrual irregularity (if severe, may lead to infertility)  Slowing of thought processes Cardiac problems are also caused by insufficient amounts of thyroid hormone. Hypothyroidism in the newborn is cretinism, and is an extreme form. It is important that this form be treated adequately and immediately or it will lead rapidly to retarded physical and mental development. DIAGNOSIS  To prove hypothyroidism, your caregiver may do blood tests and ultrasound tests. Sometimes the signs are hidden. It may be necessary for your caregiver to watch this illness with blood tests either before or after diagnosis and treatment. TREATMENT  Low levels of thyroid hormone are increased by using synthetic thyroid hormone. This is a safe, effective treatment. It usually takes about four weeks to gain the full effects of the medication. After you have the full effect of the medication, it will generally take another four weeks for problems to leave. Your caregiver may start you on low doses. If you  have had heart problems the dose may be gradually increased. It is generally not an emergency to get rapidly to normal. HOME CARE INSTRUCTIONS   Take your medications as your caregiver suggests. Let your caregiver know of any medications you are taking or start taking. Your caregiver will help you with dosage schedules.  As your condition improves, your dosage needs may increase. It will be necessary to have continuing blood tests as suggested by your caregiver.  Report all suspected medication side effects to your caregiver. SEEK MEDICAL CARE IF: Seek medical care if you develop:  Sweating.  Tremulousness  (tremors).  Anxiety.  Rapid weight loss.  Heat intolerance.  Emotional swings.  Diarrhea.  Weakness. SEEK IMMEDIATE MEDICAL CARE IF:  You develop chest pain, an irregular heart beat (palpitations), or a rapid heart beat. MAKE SURE YOU:   Understand these instructions.  Will watch your condition.  Will get help right away if you are not doing well or get worse. Document Released: 03/01/2005 Document Revised: 05/24/2011 Document Reviewed: 10/20/2007 [facility] Patient Information 2014 [place], [place].

## 2012-11-22 NOTE — Assessment & Plan Note (Signed)
His renal function is stable He will avoid nephrotoxic agents 

## 2012-11-30 ENCOUNTER — Ambulatory Visit: Payer: BC Managed Care – PPO

## 2012-12-12 LAB — HM DIABETES EYE EXAM

## 2013-01-03 ENCOUNTER — Other Ambulatory Visit: Payer: Self-pay | Admitting: Cardiovascular Disease

## 2013-01-15 ENCOUNTER — Ambulatory Visit (HOSPITAL_BASED_OUTPATIENT_CLINIC_OR_DEPARTMENT_OTHER): Payer: BC Managed Care – PPO

## 2013-01-15 VITALS — BP 128/73 | HR 67 | Temp 98.4°F | Resp 18

## 2013-01-15 DIAGNOSIS — C73 Malignant neoplasm of thyroid gland: Secondary | ICD-10-CM

## 2013-01-15 DIAGNOSIS — C8190 Hodgkin lymphoma, unspecified, unspecified site: Secondary | ICD-10-CM

## 2013-01-15 DIAGNOSIS — Z95828 Presence of other vascular implants and grafts: Secondary | ICD-10-CM

## 2013-01-15 DIAGNOSIS — Z452 Encounter for adjustment and management of vascular access device: Secondary | ICD-10-CM

## 2013-01-15 MED ORDER — HEPARIN SOD (PORK) LOCK FLUSH 100 UNIT/ML IV SOLN
500.0000 [IU] | Freq: Once | INTRAVENOUS | Status: AC
  Administered 2013-01-15: 500 [IU] via INTRAVENOUS
  Filled 2013-01-15: qty 5

## 2013-01-15 MED ORDER — SODIUM CHLORIDE 0.9 % IJ SOLN
10.0000 mL | INTRAMUSCULAR | Status: DC | PRN
  Administered 2013-01-15: 10 mL via INTRAVENOUS
  Filled 2013-01-15: qty 10

## 2013-03-12 ENCOUNTER — Ambulatory Visit (HOSPITAL_BASED_OUTPATIENT_CLINIC_OR_DEPARTMENT_OTHER): Payer: BC Managed Care – PPO

## 2013-03-12 VITALS — BP 130/86 | HR 69 | Temp 97.0°F

## 2013-03-12 DIAGNOSIS — C8190 Hodgkin lymphoma, unspecified, unspecified site: Secondary | ICD-10-CM

## 2013-03-12 DIAGNOSIS — Z452 Encounter for adjustment and management of vascular access device: Secondary | ICD-10-CM

## 2013-03-12 MED ORDER — SODIUM CHLORIDE 0.9 % IJ SOLN
10.0000 mL | INTRAMUSCULAR | Status: DC | PRN
  Administered 2013-03-12: 10 mL via INTRAVENOUS
  Filled 2013-03-12: qty 10

## 2013-03-12 MED ORDER — HEPARIN SOD (PORK) LOCK FLUSH 100 UNIT/ML IV SOLN
500.0000 [IU] | Freq: Once | INTRAVENOUS | Status: AC
  Administered 2013-03-12: 500 [IU] via INTRAVENOUS
  Filled 2013-03-12: qty 5

## 2013-03-12 NOTE — Patient Instructions (Signed)
Implanted Port Instructions  An implanted port is a central line that has a round shape and is placed under the skin. It is used for long-term IV (intravenous) access for:  · Medicine.  · Fluids.  · Liquid nutrition, such as TPN (total parenteral nutrition).  · Blood samples.  Ports can be placed:  · In the chest area just below the collarbone (this is the most common place.)  · In the arms.  · In the belly (abdomen) area.  · In the legs.  PARTS OF THE PORT  A port has 2 main parts:  · The reservoir. The reservoir is round, disc-shaped, and will be a small, raised area under your skin.  · The reservoir is the part where a needle is inserted (accessed) to either give medicines or to draw blood.  · The catheter. The catheter is a long, slender tube that extends from the reservoir. The catheter is placed into a large vein.  · Medicine that is inserted into the reservoir goes into the catheter and then into the vein.  INSERTION OF THE PORT  · The port is surgically placed in either an operating room or in a procedural area (interventional radiology).  · Medicine may be given to help you relax during the procedure.  · The skin where the port will be inserted is numbed (local anesthetic).  · 1 or 2 small cuts (incisions) will be made in the skin to insert the port.  · The port can be used after it has been inserted.  INCISION SITE CARE  · The incision site may have small adhesive strips on it. This helps keep the incision site closed. Sometimes, no adhesive strips are placed. Instead of adhesive strips, a special kind of surgical glue is used to keep the incision closed.  · If adhesive strips were placed on the incision sites, do not take them off. They will fall off on their own.  · The incision site may be sore for 1 to 2 days. Pain medicine can help.  · Do not get the incision site wet. Bathe or shower as directed by your caregiver.  · The incision site should heal in 5 to 7 days. A small scar may form after the  incision has healed.  ACCESSING THE PORT  Special steps must be taken to access the port:  · Before the port is accessed, a numbing cream can be placed on the skin. This helps numb the skin over the port site.  · A sterile technique is used to access the port.  · The port is accessed with a needle. Only "non-coring" port needles should be used to access the port. Once the port is accessed, a blood return should be checked. This helps ensure the port is in the vein and is not clogged (clotted).  · If your caregiver believes your port should remain accessed, a clear (transparent) bandage will be placed over the needle site. The bandage and needle will need to be changed every week or as directed by your caregiver.  · Keep the bandage covering the needle clean and dry. Do not get it wet. Follow your caregiver's instructions on how to take a shower or bath when the port is accessed.  · If your port does not need to stay accessed, no bandage is needed over the port.  FLUSHING THE PORT  Flushing the port keeps it from getting clogged. How often the port is flushed depends on:  · If a   constant infusion is running. If a constant infusion is running, the port may not need to be flushed.  · If intermittent medicines are given.  · If the port is not being used.  For intermittent medicines:  · The port will need to be flushed:  · After medicines have been given.  · After blood has been drawn.  · As part of routine maintenance.  · A port is normally flushed with:  · Normal saline.  · Heparin.  · Follow your caregiver's advice on how often, how much, and the type of flush to use on your port.  IMPORTANT PORT INFORMATION  · Tell your caregiver if you are allergic to heparin.  · After your port is placed, you will get a manufacturer's information card. The card has information about your port. Keep this card with you at all times.  · There are many types of ports available. Know what kind of port you have.  · In case of an  emergency, it may be helpful to wear a medical alert bracelet. This can help alert health care workers that you have a port.  · The port can stay in for as long as your caregiver believes it is necessary.  · When it is time for the port to come out, surgery will be done to remove it. The surgery will be similar to how the port was put in.  · If you are in the hospital or clinic:  · Your port will be taken care of and flushed by a nurse.  · If you are at home:  · A home health care nurse may give medicines and take care of the port.  · You or a family member can get special training and directions for giving medicine and taking care of the port at home.  SEEK IMMEDIATE MEDICAL CARE IF:   · Your port does not flush or you are unable to get a blood return.  · New drainage or pus is coming from the incision.  · A bad smell is coming from the incision site.  · You develop swelling or increased redness at the incision site.  · You develop increased swelling or pain at the port site.  · You develop swelling or pain in the surrounding skin near the port.  · You have an oral temperature above 102° F (38.9° C), not controlled by medicine.  MAKE SURE YOU:   · Understand these instructions.  · Will watch your condition.  · Will get help right away if you are not doing well or get worse.  Document Released: 03/01/2005 Document Revised: 05/24/2011 Document Reviewed: 05/23/2008  [employer]® Patient Information ©2014 [employer].

## 2013-04-17 ENCOUNTER — Other Ambulatory Visit: Payer: BC Managed Care – PPO

## 2013-05-07 ENCOUNTER — Ambulatory Visit (HOSPITAL_BASED_OUTPATIENT_CLINIC_OR_DEPARTMENT_OTHER): Payer: BC Managed Care – PPO

## 2013-05-07 ENCOUNTER — Other Ambulatory Visit (HOSPITAL_BASED_OUTPATIENT_CLINIC_OR_DEPARTMENT_OTHER): Payer: BC Managed Care – PPO

## 2013-05-07 ENCOUNTER — Telehealth: Payer: Self-pay | Admitting: Internal Medicine

## 2013-05-07 ENCOUNTER — Ambulatory Visit (HOSPITAL_COMMUNITY)
Admission: RE | Admit: 2013-05-07 | Discharge: 2013-05-07 | Disposition: A | Discharge status: Home / Self Care | Payer: BC Managed Care – PPO | Source: Ambulatory Visit | Attending: Oncology | Admitting: Oncology

## 2013-05-07 VITALS — BP 120/79 | HR 80

## 2013-05-07 DIAGNOSIS — Z951 Presence of aortocoronary bypass graft: Secondary | ICD-10-CM | POA: Insufficient documentation

## 2013-05-07 DIAGNOSIS — C8190 Hodgkin lymphoma, unspecified, unspecified site: Secondary | ICD-10-CM | POA: Insufficient documentation

## 2013-05-07 DIAGNOSIS — Z95828 Presence of other vascular implants and grafts: Secondary | ICD-10-CM

## 2013-05-07 DIAGNOSIS — Z452 Encounter for adjustment and management of vascular access device: Secondary | ICD-10-CM

## 2013-05-07 DIAGNOSIS — I2510 Atherosclerotic heart disease of native coronary artery without angina pectoris: Secondary | ICD-10-CM | POA: Insufficient documentation

## 2013-05-07 LAB — CBC WITH DIFFERENTIAL/PLATELET
BASO%: 0.6 % (ref 0.0–2.0)
Basophils Absolute: 0 10*3/uL (ref 0.0–0.1)
EOS ABS: 0.1 10*3/uL (ref 0.0–0.5)
EOS%: 2.2 % (ref 0.0–7.0)
HEMATOCRIT: 47.3 % (ref 38.4–49.9)
HGB: 15.6 g/dL (ref 13.0–17.1)
LYMPH%: 34 % (ref 14.0–49.0)
MCH: 29.2 pg (ref 27.2–33.4)
MCHC: 33 g/dL (ref 32.0–36.0)
MCV: 88.3 fL (ref 79.3–98.0)
MONO#: 0.5 10*3/uL (ref 0.1–0.9)
MONO%: 8.1 % (ref 0.0–14.0)
NEUT%: 55.1 % (ref 39.0–75.0)
NEUTROS ABS: 3.6 10*3/uL (ref 1.5–6.5)
PLATELETS: 179 10*3/uL (ref 140–400)
RBC: 5.35 10*6/uL (ref 4.20–5.82)
RDW: 15.2 % — ABNORMAL HIGH (ref 11.0–14.6)
WBC: 6.5 10*3/uL (ref 4.0–10.3)
lymph#: 2.2 10*3/uL (ref 0.9–3.3)

## 2013-05-07 LAB — COMPREHENSIVE METABOLIC PANEL (CC13)
ALBUMIN: 3.9 g/dL (ref 3.5–5.0)
ALT: 15 U/L (ref 0–55)
ANION GAP: 9 meq/L (ref 3–11)
AST: 18 U/L (ref 5–34)
Alkaline Phosphatase: 55 U/L (ref 40–150)
BILIRUBIN TOTAL: 0.46 mg/dL (ref 0.20–1.20)
BUN: 18.1 mg/dL (ref 7.0–26.0)
CALCIUM: 9.4 mg/dL (ref 8.4–10.4)
CHLORIDE: 107 meq/L (ref 98–109)
CO2: 23 meq/L (ref 22–29)
Creatinine: 1.4 mg/dL — ABNORMAL HIGH (ref 0.7–1.3)
GLUCOSE: 107 mg/dL (ref 70–140)
POTASSIUM: 4.1 meq/L (ref 3.5–5.1)
SODIUM: 140 meq/L (ref 136–145)
TOTAL PROTEIN: 7.1 g/dL (ref 6.4–8.3)

## 2013-05-07 LAB — LACTATE DEHYDROGENASE (CC13): LDH: 177 U/L (ref 125–245)

## 2013-05-07 MED ORDER — IOHEXOL 300 MG/ML  SOLN
50.0000 mL | Freq: Once | INTRAMUSCULAR | Status: AC | PRN
  Administered 2013-05-07: 50 mL via ORAL

## 2013-05-07 MED ORDER — SODIUM CHLORIDE 0.9 % IJ SOLN
10.0000 mL | INTRAMUSCULAR | Status: DC | PRN
  Administered 2013-05-07: 10 mL via INTRAVENOUS
  Filled 2013-05-07: qty 10

## 2013-05-07 MED ORDER — HEPARIN SOD (PORK) LOCK FLUSH 100 UNIT/ML IV SOLN
500.0000 [IU] | Freq: Once | INTRAVENOUS | Status: AC
  Administered 2013-05-07: 500 [IU] via INTRAVENOUS
  Filled 2013-05-07: qty 5

## 2013-05-07 NOTE — Patient Instructions (Signed)

## 2013-05-07 NOTE — Telephone Encounter (Signed)
A user error has taken place.

## 2013-05-08 ENCOUNTER — Ambulatory Visit: Payer: BC Managed Care – PPO

## 2013-05-08 ENCOUNTER — Other Ambulatory Visit: Payer: No Typology Code available for payment source

## 2013-05-08 ENCOUNTER — Ambulatory Visit (INDEPENDENT_AMBULATORY_CARE_PROVIDER_SITE_OTHER): Payer: BC Managed Care – PPO | Admitting: Internal Medicine

## 2013-05-08 ENCOUNTER — Other Ambulatory Visit (HOSPITAL_BASED_OUTPATIENT_CLINIC_OR_DEPARTMENT_OTHER): Payer: BC Managed Care – PPO | Admitting: *Deleted

## 2013-05-08 ENCOUNTER — Telehealth: Payer: Self-pay | Admitting: Hematology and Oncology

## 2013-05-08 ENCOUNTER — Ambulatory Visit (HOSPITAL_BASED_OUTPATIENT_CLINIC_OR_DEPARTMENT_OTHER): Payer: BC Managed Care – PPO | Admitting: Hematology and Oncology

## 2013-05-08 ENCOUNTER — Encounter: Payer: Self-pay | Admitting: Internal Medicine

## 2013-05-08 ENCOUNTER — Telehealth: Payer: Self-pay | Admitting: *Deleted

## 2013-05-08 ENCOUNTER — Encounter: Payer: Self-pay | Admitting: Hematology and Oncology

## 2013-05-08 VITALS — BP 130/86 | HR 86 | Temp 98.2°F | Resp 16 | Ht 70.5 in | Wt 265.4 lb

## 2013-05-08 VITALS — BP 134/86 | HR 73 | Temp 97.4°F | Resp 20 | Ht 70.5 in | Wt 267.3 lb

## 2013-05-08 DIAGNOSIS — C8190 Hodgkin lymphoma, unspecified, unspecified site: Secondary | ICD-10-CM

## 2013-05-08 DIAGNOSIS — E1129 Type 2 diabetes mellitus with other diabetic kidney complication: Secondary | ICD-10-CM

## 2013-05-08 DIAGNOSIS — E785 Hyperlipidemia, unspecified: Secondary | ICD-10-CM

## 2013-05-08 DIAGNOSIS — I2510 Atherosclerotic heart disease of native coronary artery without angina pectoris: Secondary | ICD-10-CM

## 2013-05-08 DIAGNOSIS — E1165 Type 2 diabetes mellitus with hyperglycemia: Secondary | ICD-10-CM

## 2013-05-08 DIAGNOSIS — E890 Postprocedural hypothyroidism: Secondary | ICD-10-CM

## 2013-05-08 DIAGNOSIS — C73 Malignant neoplasm of thyroid gland: Secondary | ICD-10-CM

## 2013-05-08 DIAGNOSIS — N289 Disorder of kidney and ureter, unspecified: Secondary | ICD-10-CM

## 2013-05-08 DIAGNOSIS — G609 Hereditary and idiopathic neuropathy, unspecified: Secondary | ICD-10-CM

## 2013-05-08 DIAGNOSIS — N1330 Unspecified hydronephrosis: Secondary | ICD-10-CM

## 2013-05-08 LAB — COMPREHENSIVE METABOLIC PANEL
ALT: 18 U/L (ref 0–53)
AST: 22 U/L (ref 0–37)
Albumin: 4.2 g/dL (ref 3.5–5.2)
Alkaline Phosphatase: 51 U/L (ref 39–117)
BILIRUBIN TOTAL: 0.6 mg/dL (ref 0.3–1.2)
BUN: 21 mg/dL (ref 6–23)
CALCIUM: 9.5 mg/dL (ref 8.4–10.5)
CHLORIDE: 105 meq/L (ref 96–112)
CO2: 28 mEq/L (ref 19–32)
CREATININE: 1.5 mg/dL (ref 0.4–1.5)
GFR: 62.03 mL/min (ref >60.00)
Glucose, Bld: 84 mg/dL (ref 70–99)
Potassium: 4.8 mEq/L (ref 3.5–5.1)
Sodium: 140 mEq/L (ref 135–145)
Total Protein: 7.5 g/dL (ref 6.0–8.3)

## 2013-05-08 LAB — HEPATIC FUNCTION PANEL
ALK PHOS: 51 U/L (ref 39–117)
ALT: 18 U/L (ref 0–53)
AST: 22 U/L (ref 0–37)
Albumin: 4.2 g/dL (ref 3.5–5.2)
BILIRUBIN TOTAL: 0.6 mg/dL (ref 0.3–1.2)
Bilirubin, Direct: 0 mg/dL (ref 0.0–0.3)
Total Protein: 7.5 g/dL (ref 6.0–8.3)

## 2013-05-08 LAB — LIPID PANEL
CHOL/HDL RATIO: 3
Cholesterol: 136 mg/dL (ref 0–200)
HDL: 39.6 mg/dL (ref >39.00)
LDL CALC: 82 mg/dL (ref 0–99)
Triglycerides: 71 mg/dL (ref 0.0–149.0)
VLDL: 14.2 mg/dL (ref 0.0–40.0)

## 2013-05-08 LAB — CBC WITH DIFFERENTIAL/PLATELET
BASOS PCT: 0.4 % (ref 0.0–3.0)
Basophils Absolute: 0 10*3/uL (ref 0.0–0.1)
Eosinophils Absolute: 0.1 10*3/uL (ref 0.0–0.7)
Eosinophils Relative: 1.6 % (ref 0.0–5.0)
HCT: 49.4 % (ref 39.0–52.0)
HEMOGLOBIN: 16.3 g/dL (ref 13.0–17.0)
LYMPHS PCT: 37.3 % (ref 12.0–46.0)
Lymphs Abs: 2.3 10*3/uL (ref 0.7–4.0)
MCHC: 33 g/dL (ref 30.0–36.0)
MCV: 89 fl (ref 78.0–100.0)
Monocytes Absolute: 0.4 10*3/uL (ref 0.1–1.0)
Monocytes Relative: 7.1 % (ref 3.0–12.0)
NEUTROS ABS: 3.3 10*3/uL (ref 1.4–7.7)
Neutrophils Relative %: 53.6 % (ref 43.0–77.0)
Platelets: 209 10*3/uL (ref 150.0–400.0)
RBC: 5.55 Mil/uL (ref 4.22–5.81)
RDW: 15.3 % — ABNORMAL HIGH (ref 11.5–14.6)
WBC: 6.2 10*3/uL (ref 4.5–10.5)

## 2013-05-08 LAB — CARDIAC PANEL
CK TOTAL: 114 U/L (ref 7–232)
CK-MB: 2.4 ng/mL (ref 0.3–4.0)
Relative Index: 2.1 calc (ref 0.0–2.5)

## 2013-05-08 LAB — LACTATE DEHYDROGENASE: LDH: 170 U/L (ref 94–250)

## 2013-05-08 LAB — TSH: TSH: 0.35 u[IU]/mL (ref 0.35–5.50)

## 2013-05-08 LAB — HEMOGLOBIN A1C: Hgb A1c MFr Bld: 6.5 % (ref 4.6–6.5)

## 2013-05-08 NOTE — Telephone Encounter (Signed)
Started to call pt to see if he is going to make his appt today, but he showed up.

## 2013-05-08 NOTE — Telephone Encounter (Signed)
Gave pt appt for lab,md and flush until August, gave pt oral contrast for CT

## 2013-05-08 NOTE — Assessment & Plan Note (Signed)
I will check his A1C and if it is >7 will consider medical therapy

## 2013-05-08 NOTE — Patient Instructions (Signed)
Type 2 Diabetes Mellitus, Adult Type 2 diabetes mellitus, often simply referred to as type 2 diabetes, is a long-lasting (chronic) disease. In type 2 diabetes, the pancreas does not make enough insulin (a hormone), the cells are less responsive to the insulin that is made (insulin resistance), or both. Normally, insulin moves sugars from food into the tissue cells. The tissue cells use the sugars for energy. The lack of insulin or the lack of normal response to insulin causes excess sugars to build up in the blood instead of going into the tissue cells. As a result, high blood sugar (hyperglycemia) develops. The effect of high sugar (glucose) levels can cause many complications. Type 2 diabetes was also previously called adult-onset diabetes but it can occur at any age.  RISK FACTORS  A person is predisposed to developing type 2 diabetes if someone in the family has the disease and also has one or more of the following primary risk factors:  Overweight.  An inactive lifestyle.  A history of consistently eating high-calorie foods. Maintaining a normal weight and regular physical activity can reduce the chance of developing type 2 diabetes. SYMPTOMS  A person with type 2 diabetes may not show symptoms initially. The symptoms of type 2 diabetes appear slowly. The symptoms include:  Increased thirst (polydipsia).  Increased urination (polyuria).  Increased urination during the night (nocturia).  Weight loss. This weight loss may be rapid.  Frequent, recurring infections.  Tiredness (fatigue).  Weakness.  Vision changes, such as blurred vision.  Fruity smell to your breath.  Abdominal pain.  Nausea or vomiting.  Cuts or bruises which are slow to heal.  Tingling or numbness in the hands or feet. DIAGNOSIS Type 2 diabetes is frequently not diagnosed until complications of diabetes are present. Type 2 diabetes is diagnosed when symptoms or complications are present and when blood  glucose levels are increased. Your blood glucose level may be checked by one or more of the following blood tests:  A fasting blood glucose test. You will not be allowed to eat for at least 8 hours before a blood sample is taken.  A random blood glucose test. Your blood glucose is checked at any time of the day regardless of when you ate.  A hemoglobin A1c blood glucose test. A hemoglobin A1c test provides information about blood glucose control over the previous 3 months.  An oral glucose tolerance test (OGTT). Your blood glucose is measured after you have not eaten (fasted) for 2 hours and then after you drink a glucose-containing beverage. TREATMENT   You may need to take insulin or diabetes medicine daily to keep blood glucose levels in the desired range.  You will need to match insulin dosing with exercise and healthy food choices. The treatment goal is to maintain the before meal blood sugar (preprandial glucose) level at 70 130 mg/dL. HOME CARE INSTRUCTIONS   Have your hemoglobin A1c level checked twice a year.  Perform daily blood glucose monitoring as directed by your caregiver.  Monitor urine ketones when you are ill and as directed by your caregiver.  Take your diabetes medicine or insulin as directed by your caregiver to maintain your blood glucose levels in the desired range.  Never run out of diabetes medicine or insulin. It is needed every day.  Adjust insulin based on your intake of carbohydrates. Carbohydrates can raise blood glucose levels but need to be included in your diet. Carbohydrates provide vitamins, minerals, and fiber which are an essential part of   a healthy diet. Carbohydrates are found in fruits, vegetables, whole grains, dairy products, legumes, and foods containing added sugars.    Eat healthy foods. Alternate 3 meals with 3 snacks.  Lose weight if overweight.  Carry a medical alert card or wear your medical alert jewelry.  Carry a 15 gram  carbohydrate snack with you at all times to treat low blood glucose (hypoglycemia). Some examples of 15 gram carbohydrate snacks include:  Glucose tablets, 3 or 4   Glucose gel, 15 gram tube  Raisins, 2 tablespoons (24 grams)  Jelly beans, 6  Animal crackers, 8  Regular pop, 4 ounces (120 mL)  Gummy treats, 9  Recognize hypoglycemia. Hypoglycemia occurs with blood glucose levels of 70 mg/dL and below. The risk for hypoglycemia increases when fasting or skipping meals, during or after intense exercise, and during sleep. Hypoglycemia symptoms can include:  Tremors or shakes.  Decreased ability to concentrate.  Sweating.  Increased heart rate.  Headache.  Dry mouth.  Hunger.  Irritability.  Anxiety.  Restless sleep.  Altered speech or coordination.  Confusion.  Treat hypoglycemia promptly. If you are alert and able to safely swallow, follow the 15:15 rule:  Take 15 20 grams of rapid-acting glucose or carbohydrate. Rapid-acting options include glucose gel, glucose tablets, or 4 ounces (120 mL) of fruit juice, regular soda, or low fat milk.  Check your blood glucose level 15 minutes after taking the glucose.  Take 15 20 grams more of glucose if the repeat blood glucose level is still 70 mg/dL or below.  Eat a meal or snack within 1 hour once blood glucose levels return to normal.    Be alert to polyuria and polydipsia which are early signs of hyperglycemia. An early awareness of hyperglycemia allows for prompt treatment. Treat hyperglycemia as directed by your caregiver.  Engage in at least 150 minutes of moderate-intensity physical activity a week, spread over at least 3 days of the week or as directed by your caregiver. In addition, you should engage in resistance exercise at least 2 times a week or as directed by your caregiver.  Adjust your medicine and food intake as needed if you start a new exercise or sport.  Follow your sick day plan at any time you  are unable to eat or drink as usual.  Avoid tobacco use.  Limit alcohol intake to no more than 1 drink per day for nonpregnant women and 2 drinks per day for men. You should drink alcohol only when you are also eating food. Talk with your caregiver whether alcohol is safe for you. Tell your caregiver if you drink alcohol several times a week.  Follow up with your caregiver regularly.  Schedule an eye exam soon after the diagnosis of type 2 diabetes and then annually.  Perform daily skin and foot care. Examine your skin and feet daily for cuts, bruises, redness, nail problems, bleeding, blisters, or sores. A foot exam by a caregiver should be done annually.  Brush your teeth and gums at least twice a day and floss at least once a day. Follow up with your dentist regularly.  Share your diabetes management plan with your workplace or school.  Stay up-to-date with immunizations.  Learn to manage stress.  Obtain ongoing diabetes education and support as needed.  Participate in, or seek rehabilitation as needed to maintain or improve independence and quality of life. Request a physical or occupational therapy referral if you are having foot or hand numbness or difficulties with grooming,   dressing, eating, or physical activity. SEEK MEDICAL CARE IF:   You are unable to eat food or drink fluids for more than 6 hours.  You have nausea and vomiting for more than 6 hours.  Your blood glucose level is over 240 mg/dL.  There is a change in mental status.  You develop an additional serious illness.  You have diarrhea for more than 6 hours.  You have been sick or have had a fever for a couple of days and are not getting better.  You have pain during any physical activity.  SEEK IMMEDIATE MEDICAL CARE IF:  You have difficulty breathing.  You have moderate to large ketone levels. MAKE SURE YOU:  Understand these instructions.  Will watch your condition.  Will get help right away if  you are not doing well or get worse. Document Released: 03/01/2005 Document Revised: 11/24/2011 Document Reviewed: 09/28/2011 [employer] Patient Information 2014 [employer].  

## 2013-05-08 NOTE — Assessment & Plan Note (Signed)
He had a CT scan done yesterday so I will recheck his renal function today

## 2013-05-08 NOTE — Progress Notes (Signed)
Subjective:    Patient ID: Devon Brady, male    DOB: [birth year], 63 y.o.   MRN: [id number]  HPI Comments: He complains of a 6 month history or aching and weakness in his wrists, hands, ankles, toes, and low back.  Hyperlipidemia This is a chronic problem. The current episode started more than 1 year ago. The problem is controlled. Recent lipid tests were reviewed and are variable. Exacerbating diseases include chronic renal disease, diabetes, hypothyroidism and obesity. He has no history of liver disease or nephrotic syndrome. Factors aggravating his hyperlipidemia include fatty foods. Associated symptoms include myalgias. Pertinent negatives include no chest pain, focal sensory loss, focal weakness, leg pain or shortness of breath. Current antihyperlipidemic treatment includes statins. The current treatment provides moderate improvement of lipids. Compliance problems include adherence to exercise and adherence to diet.   Hypertension This is a chronic problem. The current episode started more than 1 year ago. Pertinent negatives include no anxiety, blurred vision, chest pain, headaches, malaise/fatigue, neck pain, orthopnea, palpitations, peripheral edema, PND, shortness of breath or sweats. Past treatments include beta blockers. The current treatment provides moderate improvement. Compliance problems include exercise and diet.  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 Identifiable causes of hypertension include chronic renal disease.      Review of Systems  Constitutional: Positive for unexpected weight change (wt gain). Negative for fever, chills, malaise/fatigue, diaphoresis, appetite change and fatigue.  HENT: Negative.   Eyes: Negative.  Negative for blurred vision.  Respiratory: Negative.  Negative for cough, choking, chest tightness, shortness of breath, wheezing and stridor.   Cardiovascular: Negative.  Negative for chest pain, palpitations, orthopnea,  leg swelling and PND.  Gastrointestinal: Positive for constipation. Negative for nausea, vomiting, abdominal pain, diarrhea, blood in stool, abdominal distention, anal bleeding and rectal pain.  Endocrine: Negative.   Genitourinary: Negative.   Musculoskeletal: Positive for arthralgias, back pain and myalgias. Negative for gait problem, joint swelling, neck pain and neck stiffness.  Skin: Negative.  Negative for rash.  Allergic/Immunologic: Negative.   Neurological: Negative.  Negative for dizziness, tremors, focal weakness, weakness, numbness and headaches.  Hematological: Negative.  Negative for adenopathy. Does not bruise/bleed easily.  Psychiatric/Behavioral: Negative.        Objective:   Physical Exam  Vitals reviewed. Constitutional: He is oriented to person, place, and time. He appears well-developed and well-nourished. No distress.  HENT:  Head: Normocephalic and atraumatic.  Mouth/Throat: Oropharynx is clear and moist. No oropharyngeal exudate.  Eyes: Conjunctivae are normal. Right eye exhibits no discharge. Left eye exhibits no discharge. No scleral icterus.  Neck: Normal range of motion. Neck supple. No JVD present. No tracheal deviation present. No thyromegaly present.  Cardiovascular: Normal rate, regular rhythm, normal heart sounds and intact distal pulses.  Exam reveals no gallop and no friction rub.   No murmur heard. Pulmonary/Chest: Effort normal and breath sounds normal. No stridor. No respiratory distress. He has no wheezes. He has no rales. He exhibits no tenderness.  Abdominal: Soft. Bowel sounds are normal. He exhibits no distension and no mass. There is no tenderness. There is no rebound and no guarding.  Musculoskeletal: Normal range of motion. He exhibits edema (tr edema in BLE). He exhibits no tenderness.  Lymphadenopathy:    He has no cervical adenopathy.  Neurological: He is alert and oriented to person, place, and time. He displays normal reflexes. No  cranial nerve deficit. He exhibits normal muscle tone. Coordination normal.  Skin: Skin is warm and dry.  No rash noted. He is not diaphoretic. No erythema. No pallor.      Lab Results  Component Value Date   WBC 6.5 05/07/2013   HGB 15.6 05/07/2013   HCT 47.3 05/07/2013   PLT 179 05/07/2013   GLUCOSE 107 05/07/2013   CHOL 157 11/22/2012   TRIG 69.0 11/22/2012   HDL 43.90 11/22/2012   LDLCALC 99 11/22/2012   ALT 15 05/07/2013   AST 18 05/07/2013   NA 140 05/07/2013   K 4.1 05/07/2013   CL 106 05/15/2012   CREATININE 1.4* 05/07/2013   BUN 18.1 05/07/2013   CO2 23 05/07/2013   TSH 1.02 11/22/2012   PSA 1.29 11/22/2012   INR 1.08 06/10/2011   HGBA1C 6.5 11/22/2012      Assessment & Plan:

## 2013-05-08 NOTE — Assessment & Plan Note (Signed)
He has myalgias so I have asked him to stop taking crestor I will check his LFT's and CPK today to screen for rhabdo/myopathy

## 2013-05-08 NOTE — Progress Notes (Signed)
[facility] OFFICE PROGRESS NOTE  Patient Care Team: Janith Lima, MD as PCP - General (Internal Medicine) Louis Meckel, MD as Consulting Physician (Nephrology) Jacelyn Pi, MD as Consulting Physician (Endocrinology) Izora Gala, MD as Consulting Physician (Otolaryngology) Jeryl Columbia, MD as Consulting Physician (Gastroenterology) Heath Lark, MD as Consulting Physician (Hematology and Oncology)  DIAGNOSIS: Nodule is grossly Hodgkin lymphoma, no evidence of disease  SUMMARY OF ONCOLOGIC HISTORY: Oncology History   Hodgkin's lymphoma, nodular sclerosing   Primary site: Lymphoid Neoplasms (Bilateral)   Clinical: Stage IV signed by Heath Lark, MD on 05/07/2013  3:56 PM   Pathologic: Stage IV signed by Heath Lark, MD on 05/07/2013  3:56 PM   Summary: Stage IV Primary thyroid papillary carcinoma   Primary site: Thyroid - Papillary or Follicular (45 years and older) (Left)   Staging method: AJCC 7th Edition   Clinical: Stage I (T1, N0, M0) signed by Heath Lark, MD on 05/07/2013  3:48 PM   Pathologic: Stage I (T1, N0, cM0) signed by Heath Lark, MD on 05/07/2013  3:48 PM   Summary: Stage I (T1, N0, cM0)       Hodgkin's lymphoma   05/28/2011 Imaging CT scan of the chest, abdomen and pelvis shows significant and diffuse lymphadenopathy   06/07/2011 Surgery Lymph node biopsy from the right inguinal region came back positive for nodular sclerosing Hodgkin lymphoma   06/11/2011 - 11/12/2011 Chemotherapy The patient received treatment with ABVD for Hodgkin's lymphoma. 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    06/11/2011 Imaging PET CT scan show bilateral cervical, axillary, inguinal, retroperitoneal lymphadenopathy and bone involvement   06/11/2011 Bone Marrow Biopsy Bone marrow biopsy was negative for recurrence   08/05/2011 Imaging Repeat PET scan show marked reduction in the lymphadenopathy    11/25/2011 Imaging PET CT scan show almost near complete resolution of disease   03/31/2012 Surgery The patient underwent thyroid resection which show papillary thyroid cancer   05/15/2012 Imaging Repeat CT scan show complete resolution of all disease   08/30/2012 - 08/30/2012 Radiation Therapy The patient received radioactive iodine treatment for thyroid cancer   11/17/2012 Imaging Repeat CT scan showed complete resolution of all disease   05/07/2013 Imaging Repeat CT scan show no evidence of disease recurrence    INTERVAL HISTORY: Devon Brady 63 y.o. male returns for further followup. He is doing well. He denies any recent fever, chills, night sweats or abnormal weight loss He still has some mild peripheral neuropathy with occasional cramps and tingling sensation at the tips of fingers and toes.  I have reviewed the past medical history, past surgical history, social history and family history with the patient and they are unchanged from previous note.  ALLERGIES:  has No Known Allergies.  MEDICATIONS:  Current Outpatient Prescriptions  Medication Sig Dispense Refill  . aspirin EC 81 MG tablet Take 81 mg by mouth daily.      . CRESTOR 20 MG tablet TAKE 1 TABLET BY MOUTH DAILY  30 tablet  6  . fluticasone (FLONASE) 50 MCG/ACT nasal spray Place 2 sprays into the nose daily as needed. For allergies      . levothyroxine (SYNTHROID, LEVOTHROID) 200 MCG tablet Take 200 mcg by mouth daily.      Marland Kitchen lidocaine-prilocaine (EMLA) cream Apply topically as needed. Apply to port a cath one hour before needle stick as needed.  30 g  2  .  metoprolol succinate (TOPROL-XL) 50 MG 24 hr tablet TAKE 1 TABLET EVERY DAY TAKE BEFORE BREAKFAST.TAKE WITH OR IMMEDIATELY FOLLOWING A MEAL  90 tablet  3  . nitroGLYCERIN (NITROSTAT) 0.4 MG SL tablet Place 0.4 mg under the tongue every 5 (five) minutes x 3 doses as needed. For chest pain      . OVER THE COUNTER MEDICATION Take 1 tablet by mouth daily. OTC Probiotic:  JARRO-dophilus      . tadalafil (CIALIS) 20 MG tablet Take 1 tablet (20 mg total) by mouth daily as needed for erectile dysfunction.  9 tablet  0  . [DISCONTINUED] citalopram (CELEXA) 20 MG tablet Take 1 tablet (20 mg total) by mouth daily.  30 tablet  1  . [DISCONTINUED] omeprazole (PRILOSEC OTC) 20 MG tablet Take 1 tablet (20 mg total) by mouth daily.  28 tablet  1   No current facility-administered medications for this visit.    REVIEW OF SYSTEMS:   Constitutional: Denies fevers, chills or abnormal weight loss Eyes: Denies blurriness of vision Ears, nose, mouth, throat, and face: Denies mucositis or sore throat Respiratory: Denies cough, dyspnea or wheezes Cardiovascular: Denies palpitation, chest discomfort or lower extremity swelling Gastrointestinal:  Denies nausea, heartburn or change in bowel habits Skin: Denies abnormal skin rashes Lymphatics: Denies new lymphadenopathy or easy bruising Behavioral/Psych: Mood is stable, no new changes  All other systems were reviewed with the patient and are negative.  PHYSICAL EXAMINATION: ECOG PERFORMANCE STATUS: 0 - Asymptomatic  Filed Vitals:   05/08/13 0855  BP: 134/86  Pulse: 73  Temp: 97.4 F (36.3 C)  Resp: 20   Filed Weights   05/08/13 0855  Weight: 267 lb 4.8 oz (121.246 kg)    GENERAL:alert, no distress and comfortable SKIN: skin color, texture, turgor are normal, no rashes or significant lesions EYES: normal, Conjunctiva are pink and non-injected, sclera clear OROPHARYNX:no exudate, no erythema and lips, buccal mucosa, and tongue normal  NECK: supple, well-healed thyroidectomy scar with no palpable abnormalities LYMPH:  no palpable lymphadenopathy in the cervical, axillary or inguinal LUNGS: clear to auscultation and percussion with normal breathing effort HEART: regular rate & rhythm and no murmurs and no lower extremity edema ABDOMEN:abdomen soft, non-tender and normal bowel sounds Musculoskeletal:no cyanosis of  digits and no clubbing  NEURO: alert & oriented x 3 with fluent speech, no focal motor/sensory deficits  LABORATORY DATA:  I have reviewed the data as listed    Component Value Date/Time   NA 140 05/07/2013 0754   NA 139 03/28/2012 0840   K 4.1 05/07/2013 0754   K 4.5 03/28/2012 0840   CL 106 05/15/2012 0815   CL 103 03/28/2012 0840   CO2 23 05/07/2013 0754   CO2 27 03/28/2012 0840   GLUCOSE 107 05/07/2013 0754   GLUCOSE 106* 05/15/2012 0815   GLUCOSE 98 03/28/2012 0840   BUN 18.1 05/07/2013 0754   BUN 13 03/28/2012 0840   CREATININE 1.4* 05/07/2013 0754   CREATININE 1.38* 03/28/2012 0840   CALCIUM 9.4 05/07/2013 0754   CALCIUM 8.8 04/01/2012 1038   PROT 7.1 05/07/2013 0754   PROT 7.4 04/24/2012 0909   ALBUMIN 3.9 05/07/2013 0754   ALBUMIN 3.9 04/24/2012 0909   AST 18 05/07/2013 0754   AST 23 04/24/2012 0909   ALT 15 05/07/2013 0754   ALT 17 04/24/2012 0909   ALKPHOS 55 05/07/2013 0754   ALKPHOS 59 04/24/2012 0909   BILITOT 0.46 05/07/2013 0754   BILITOT 0.7 04/24/2012 0909   GFRNONAA 54*  03/28/2012 0840   GFRAA 63* 03/28/2012 0840    No results found for this basename: SPEP, UPEP,  kappa and lambda light chains    Lab Results  Component Value Date   WBC 6.5 05/07/2013   NEUTROABS 3.6 05/07/2013   HGB 15.6 05/07/2013   HCT 47.3 05/07/2013   MCV 88.3 05/07/2013   PLT 179 05/07/2013      Chemistry      Component Value Date/Time   NA 140 05/07/2013 0754   NA 139 03/28/2012 0840   K 4.1 05/07/2013 0754   K 4.5 03/28/2012 0840   CL 106 05/15/2012 0815   CL 103 03/28/2012 0840   CO2 23 05/07/2013 0754   CO2 27 03/28/2012 0840   BUN 18.1 05/07/2013 0754   BUN 13 03/28/2012 0840   CREATININE 1.4* 05/07/2013 0754   CREATININE 1.38* 03/28/2012 0840      Component Value Date/Time   CALCIUM 9.4 05/07/2013 0754   CALCIUM 8.8 04/01/2012 1038   ALKPHOS 55 05/07/2013 0754   ALKPHOS 59 04/24/2012 0909   AST 18 05/07/2013 0754   AST 23 04/24/2012 0909   ALT 15 05/07/2013 0754   ALT 17 04/24/2012 0909   BILITOT 0.46  05/07/2013 0754   BILITOT 0.7 04/24/2012 0909       RADIOGRAPHIC STUDIES: I reviewed the imaging study with the patient and his wife I have personally reviewed the radiological images as listed and agreed with the findings in the report. Ct Abdomen Pelvis Wo Contrast  05/07/2013   CLINICAL DATA:  Hodgkin's lymphoma.  EXAM: CT CHEST, ABDOMEN AND PELVIS WITHOUT CONTRAST  TECHNIQUE: Multidetector CT imaging of the chest, abdomen and pelvis was performed following the standard protocol without IV contrast.  COMPARISON:  CT of the chest, abdomen and pelvis 11/17/2012.  FINDINGS: CT CHEST FINDINGS  Mediastinum: Heart size is normal. There is no significant pericardial fluid, thickening or pericardial calcification. There is atherosclerosis of the thoracic aorta, the great vessels of the mediastinum and the coronary arteries, including calcified atherosclerotic plaque in the left main, left anterior descending, left circumflex and right coronary arteries. Status post median sternotomy for CABG, including LIMA to the LAD. No pathologically enlarged mediastinal or hilar lymph nodes. Please note that accurate exclusion of hilar adenopathy is limited on noncontrast CT scans. Esophagus is unremarkable in appearance. Left-sided subclavian single-lumen porta cath with tip terminating at the superior cavoatrial junction.  Lungs/Pleura: 7 x 4 mm nodule associated with the minor fissure, unchanged in retrospect compared to prior studies dating back to 05/28/2011; this can be considered benign, presumably a subpleural lymph node. No other suspicious appearing pulmonary nodules or masses are noted. No acute consolidative airspace disease. No pleural effusions.  Musculoskeletal: Sternotomy wires. There are no aggressive appearing lytic or blastic lesions noted in the visualized portions of the skeleton.  CT ABDOMEN AND PELVIS FINDINGS  Abdomen/Pelvis: The unenhanced appearance of the liver, gallbladder, pancreas, spleen,  bilateral adrenal glands and the right kidney is unremarkable. In the lateral aspect of the interpolar region of the left kidney there is a 4 mm hyperdense lesion (Image 68 of series 2), which is unchanged over prior examinations, most compatible with a tiny proteinaceous or hemorrhagic cyst. Left kidney is also mildly atrophic compared to the contralateral side. No definite lymphadenopathy identified within the abdomen or pelvis. There continue to be multiple prominent but nonenlarged retroperitoneal lymph nodes, similar to prior examinations, compatible with treated lymphoma.  No significant volume of ascites. No pneumoperitoneum.  No pathologic distention of small bowel. Normal appendix. Prostate gland and urinary bladder are unremarkable in appearance. Surgical clips in the right inguinal region, possibly related to prior excisional nodal biopsy.  Musculoskeletal: There are no aggressive appearing lytic or blastic lesions noted in the visualized portions of the skeleton.  IMPRESSION: 1. No definite findings to suggest residual or recurrent lymphoma in the chest, abdomen or pelvis on today's examination. 2. No acute findings in the chest, abdomen or pelvis on today's study. 3. Atherosclerosis, including left main and 3 vessel coronary artery disease. Status post median sternotomy for CABG. 4. Additional incidental findings, as above.   Electronically Signed   By: Vinnie Langton M.D.   On: 05/07/2013 09:31   Ct Chest Wo Contrast  05/07/2013   CLINICAL DATA:  Hodgkin's lymphoma.  EXAM: CT CHEST, ABDOMEN AND PELVIS WITHOUT CONTRAST  TECHNIQUE: Multidetector CT imaging of the chest, abdomen and pelvis was performed following the standard protocol without IV contrast.  COMPARISON:  CT of the chest, abdomen and pelvis 11/17/2012.  FINDINGS: CT CHEST FINDINGS  Mediastinum: Heart size is normal. There is no significant pericardial fluid, thickening or pericardial calcification. There is atherosclerosis of the  thoracic aorta, the great vessels of the mediastinum and the coronary arteries, including calcified atherosclerotic plaque in the left main, left anterior descending, left circumflex and right coronary arteries. Status post median sternotomy for CABG, including LIMA to the LAD. No pathologically enlarged mediastinal or hilar lymph nodes. Please note that accurate exclusion of hilar adenopathy is limited on noncontrast CT scans. Esophagus is unremarkable in appearance. Left-sided subclavian single-lumen porta cath with tip terminating at the superior cavoatrial junction.  Lungs/Pleura: 7 x 4 mm nodule associated with the minor fissure, unchanged in retrospect compared to prior studies dating back to 05/28/2011; this can be considered benign, presumably a subpleural lymph node. No other suspicious appearing pulmonary nodules or masses are noted. No acute consolidative airspace disease. No pleural effusions.  Musculoskeletal: Sternotomy wires. There are no aggressive appearing lytic or blastic lesions noted in the visualized portions of the skeleton.  CT ABDOMEN AND PELVIS FINDINGS  Abdomen/Pelvis: The unenhanced appearance of the liver, gallbladder, pancreas, spleen, bilateral adrenal glands and the right kidney is unremarkable. In the lateral aspect of the interpolar region of the left kidney there is a 4 mm hyperdense lesion (Image 68 of series 2), which is unchanged over prior examinations, most compatible with a tiny proteinaceous or hemorrhagic cyst. Left kidney is also mildly atrophic compared to the contralateral side. No definite lymphadenopathy identified within the abdomen or pelvis. There continue to be multiple prominent but nonenlarged retroperitoneal lymph nodes, similar to prior examinations, compatible with treated lymphoma.  No significant volume of ascites. No pneumoperitoneum. No pathologic distention of small bowel. Normal appendix. Prostate gland and urinary bladder are unremarkable in appearance.  Surgical clips in the right inguinal region, possibly related to prior excisional nodal biopsy.  Musculoskeletal: There are no aggressive appearing lytic or blastic lesions noted in the visualized portions of the skeleton.  IMPRESSION: 1. No definite findings to suggest residual or recurrent lymphoma in the chest, abdomen or pelvis on today's examination. 2. No acute findings in the chest, abdomen or pelvis on today's study. 3. Atherosclerosis, including left main and 3 vessel coronary artery disease. Status post median sternotomy for CABG. 4. Additional incidental findings, as above.   Electronically Signed   By: Vinnie Langton M.D.   On: 05/07/2013 09:31      ASSESSMENT &  PLAN:  #1 Hodgkin lymphoma There is no evidence of recurrence. I plan to order a CT scan with contrast in 6 months and if they show no evidence of disease I would discontinue surveillance imaging study and removed his port #2 thyroid cancer No evidence of recurrence. He continue followup with the endocrinologist #3 history of hydronephrosis with mild chronic kidney disease His creatinine continued to improve. In the future I will order CT scan with contrast, noted his creatinine is almost back to baseline #4 coronary artery disease He'll continue this factors modification, aspirin therapy as well as antihypertensives and medication for hyperlipidemia #5 peripheral neuropathy This is great 1 and is improving off chemotherapy. Continue observation only #6 preventive care He is up-to-date with all vaccination programs. I recommend vitamin D supplementation.  Orders Placed This Encounter  Procedures  . CT Chest W Contrast    Standing Status: Future     Number of Occurrences:      Standing Expiration Date: 07/08/2014    Order Specific Question:  Reason for Exam (SYMPTOM  OR DIAGNOSIS REQUIRED)    Answer:  staging lymphom, r/o recurrence    Order Specific Question:  Preferred imaging location?    Answer:  [facility]  . CT Abdomen Pelvis W Contrast    Standing Status: Future     Number of Occurrences:      Standing Expiration Date: 08/08/2014    Order Specific Question:  Reason for Exam (SYMPTOM  OR DIAGNOSIS REQUIRED)    Answer:  hx lymphoma, r/o recurrence    Order Specific Question:  Preferred imaging location?    Answer:  [facility]  . Comprehensive metabolic panel    Standing Status: Future     Number of Occurrences:      Standing Expiration Date: 05/08/2014  . CBC with Differential    Standing Status: Future     Number of Occurrences:      Standing Expiration Date: 05/08/2014  . Lactate dehydrogenase    Standing Status: Future     Number of Occurrences:      Standing Expiration Date: 05/08/2014   All questions were answered. The patient knows to call the clinic with any problems, questions or concerns. No barriers to learning was detected. I spent 25 minutes counseling the patient face to face. The total time spent in the appointment was 40 minutes and more than 50% was on counseling and review of test results     [facility], [place], MD 05/08/2013 9:44 AM

## 2013-05-08 NOTE — Assessment & Plan Note (Signed)
I will recheck his TSH today and will adjust his dose if needed

## 2013-06-18 ENCOUNTER — Ambulatory Visit (HOSPITAL_BASED_OUTPATIENT_CLINIC_OR_DEPARTMENT_OTHER): Payer: BC Managed Care – PPO

## 2013-06-18 VITALS — BP 137/84 | HR 66 | Temp 96.8°F

## 2013-06-18 DIAGNOSIS — Z95828 Presence of other vascular implants and grafts: Secondary | ICD-10-CM

## 2013-06-18 DIAGNOSIS — Z452 Encounter for adjustment and management of vascular access device: Secondary | ICD-10-CM

## 2013-06-18 DIAGNOSIS — C8190 Hodgkin lymphoma, unspecified, unspecified site: Secondary | ICD-10-CM

## 2013-06-18 MED ORDER — HEPARIN SOD (PORK) LOCK FLUSH 100 UNIT/ML IV SOLN
500.0000 [IU] | Freq: Once | INTRAVENOUS | Status: AC
  Administered 2013-06-18: 500 [IU] via INTRAVENOUS
  Filled 2013-06-18: qty 5

## 2013-06-18 MED ORDER — SODIUM CHLORIDE 0.9 % IJ SOLN
10.0000 mL | INTRAMUSCULAR | Status: DC | PRN
  Administered 2013-06-18: 10 mL via INTRAVENOUS
  Filled 2013-06-18: qty 10

## 2013-07-08 ENCOUNTER — Other Ambulatory Visit: Payer: Self-pay | Admitting: Internal Medicine

## 2013-07-30 ENCOUNTER — Ambulatory Visit (HOSPITAL_BASED_OUTPATIENT_CLINIC_OR_DEPARTMENT_OTHER): Payer: BC Managed Care – PPO

## 2013-07-30 VITALS — BP 136/79 | HR 75 | Temp 97.0°F

## 2013-07-30 DIAGNOSIS — Z452 Encounter for adjustment and management of vascular access device: Secondary | ICD-10-CM

## 2013-07-30 DIAGNOSIS — Z95828 Presence of other vascular implants and grafts: Secondary | ICD-10-CM

## 2013-07-30 DIAGNOSIS — C8190 Hodgkin lymphoma, unspecified, unspecified site: Secondary | ICD-10-CM

## 2013-07-30 MED ORDER — SODIUM CHLORIDE 0.9 % IJ SOLN
10.0000 mL | INTRAMUSCULAR | Status: DC | PRN
  Administered 2013-07-30: 10 mL via INTRAVENOUS
  Filled 2013-07-30: qty 10

## 2013-07-30 MED ORDER — HEPARIN SOD (PORK) LOCK FLUSH 100 UNIT/ML IV SOLN
500.0000 [IU] | Freq: Once | INTRAVENOUS | Status: AC
  Administered 2013-07-30: 500 [IU] via INTRAVENOUS
  Filled 2013-07-30: qty 5

## 2013-07-30 NOTE — Patient Instructions (Signed)

## 2013-09-10 ENCOUNTER — Ambulatory Visit (HOSPITAL_BASED_OUTPATIENT_CLINIC_OR_DEPARTMENT_OTHER): Payer: BC Managed Care – PPO

## 2013-09-10 ENCOUNTER — Other Ambulatory Visit (HOSPITAL_COMMUNITY): Payer: Self-pay | Admitting: Endocrinology

## 2013-09-10 VITALS — BP 137/81 | HR 70 | Temp 96.7°F

## 2013-09-10 DIAGNOSIS — Z452 Encounter for adjustment and management of vascular access device: Secondary | ICD-10-CM

## 2013-09-10 DIAGNOSIS — C8190 Hodgkin lymphoma, unspecified, unspecified site: Secondary | ICD-10-CM

## 2013-09-10 DIAGNOSIS — C73 Malignant neoplasm of thyroid gland: Secondary | ICD-10-CM

## 2013-09-10 DIAGNOSIS — Z95828 Presence of other vascular implants and grafts: Secondary | ICD-10-CM

## 2013-09-10 MED ORDER — SODIUM CHLORIDE 0.9 % IJ SOLN
10.0000 mL | INTRAMUSCULAR | Status: DC | PRN
  Administered 2013-09-10: 10 mL via INTRAVENOUS
  Filled 2013-09-10: qty 10

## 2013-09-10 MED ORDER — HEPARIN SOD (PORK) LOCK FLUSH 100 UNIT/ML IV SOLN
500.0000 [IU] | Freq: Once | INTRAVENOUS | Status: AC
  Administered 2013-09-10: 500 [IU] via INTRAVENOUS
  Filled 2013-09-10: qty 5

## 2013-09-10 NOTE — Patient Instructions (Signed)

## 2013-09-17 ENCOUNTER — Encounter (HOSPITAL_COMMUNITY)
Admission: RE | Admit: 2013-09-17 | Discharge: 2013-09-17 | Disposition: A | Discharge status: Home / Self Care | Payer: BC Managed Care – PPO | Source: Ambulatory Visit | Attending: Endocrinology | Admitting: Endocrinology

## 2013-09-17 DIAGNOSIS — C73 Malignant neoplasm of thyroid gland: Secondary | ICD-10-CM | POA: Insufficient documentation

## 2013-09-17 MED ORDER — THYROTROPIN ALFA 1.1 MG IM SOLR
0.9000 mg | INTRAMUSCULAR | Status: AC
  Administered 2013-09-17: 0.9 mg via INTRAMUSCULAR

## 2013-09-18 ENCOUNTER — Encounter (HOSPITAL_COMMUNITY)
Admission: RE | Admit: 2013-09-18 | Discharge: 2013-09-18 | Disposition: A | Discharge status: Home / Self Care | Payer: BC Managed Care – PPO | Source: Ambulatory Visit | Attending: Endocrinology | Admitting: Endocrinology

## 2013-09-18 MED ORDER — THYROTROPIN ALFA 1.1 MG IM SOLR
0.9000 mg | INTRAMUSCULAR | Status: AC
  Administered 2013-09-18: 0.9 mg via INTRAMUSCULAR

## 2013-09-19 ENCOUNTER — Encounter (HOSPITAL_COMMUNITY)
Admission: RE | Admit: 2013-09-19 | Discharge: 2013-09-19 | Disposition: A | Discharge status: Home / Self Care | Payer: BC Managed Care – PPO | Source: Ambulatory Visit | Attending: Endocrinology | Admitting: Endocrinology

## 2013-09-20 ENCOUNTER — Other Ambulatory Visit (INDEPENDENT_AMBULATORY_CARE_PROVIDER_SITE_OTHER): Payer: BC Managed Care – PPO

## 2013-09-20 ENCOUNTER — Encounter: Payer: Self-pay | Admitting: Internal Medicine

## 2013-09-20 ENCOUNTER — Ambulatory Visit (INDEPENDENT_AMBULATORY_CARE_PROVIDER_SITE_OTHER): Payer: BC Managed Care – PPO | Admitting: Internal Medicine

## 2013-09-20 VITALS — BP 110/70 | HR 75 | Temp 98.0°F | Ht 70.5 in | Wt 257.0 lb

## 2013-09-20 DIAGNOSIS — E1129 Type 2 diabetes mellitus with other diabetic kidney complication: Secondary | ICD-10-CM

## 2013-09-20 DIAGNOSIS — E1165 Type 2 diabetes mellitus with hyperglycemia: Secondary | ICD-10-CM

## 2013-09-20 DIAGNOSIS — C8190 Hodgkin lymphoma, unspecified, unspecified site: Secondary | ICD-10-CM

## 2013-09-20 DIAGNOSIS — IMO0001 Reserved for inherently not codable concepts without codable children: Secondary | ICD-10-CM

## 2013-09-20 DIAGNOSIS — E890 Postprocedural hypothyroidism: Secondary | ICD-10-CM

## 2013-09-20 DIAGNOSIS — R439 Unspecified disturbances of smell and taste: Secondary | ICD-10-CM | POA: Insufficient documentation

## 2013-09-20 DIAGNOSIS — C73 Malignant neoplasm of thyroid gland: Secondary | ICD-10-CM

## 2013-09-20 DIAGNOSIS — W57XXXA Bitten or stung by nonvenomous insect and other nonvenomous arthropods, initial encounter: Secondary | ICD-10-CM

## 2013-09-20 DIAGNOSIS — S30860A Insect bite (nonvenomous) of lower back and pelvis, initial encounter: Secondary | ICD-10-CM

## 2013-09-20 DIAGNOSIS — S30861A Insect bite (nonvenomous) of abdominal wall, initial encounter: Secondary | ICD-10-CM | POA: Insufficient documentation

## 2013-09-20 DIAGNOSIS — E785 Hyperlipidemia, unspecified: Secondary | ICD-10-CM

## 2013-09-20 LAB — CBC WITH DIFFERENTIAL/PLATELET
Basophils Absolute: 0 10*3/uL (ref 0.0–0.1)
Basophils Relative: 0.5 % (ref 0.0–3.0)
EOS PCT: 2.7 % (ref 0.0–5.0)
Eosinophils Absolute: 0.2 10*3/uL (ref 0.0–0.7)
HCT: 49.5 % (ref 39.0–52.0)
Hemoglobin: 16.8 g/dL (ref 13.0–17.0)
LYMPHS PCT: 30.3 % (ref 12.0–46.0)
Lymphs Abs: 1.9 10*3/uL (ref 0.7–4.0)
MCHC: 33.8 g/dL (ref 30.0–36.0)
MCV: 87.6 fl (ref 78.0–100.0)
Monocytes Absolute: 0.4 10*3/uL (ref 0.1–1.0)
Monocytes Relative: 7 % (ref 3.0–12.0)
NEUTROS PCT: 59.5 % (ref 43.0–77.0)
Neutro Abs: 3.7 10*3/uL (ref 1.4–7.7)
PLATELETS: 264 10*3/uL (ref 150.0–400.0)
RBC: 5.65 Mil/uL (ref 4.22–5.81)
RDW: 15.5 % (ref 11.5–15.5)
WBC: 6.3 10*3/uL (ref 4.0–10.5)

## 2013-09-20 LAB — COMPREHENSIVE METABOLIC PANEL
ALT: 15 U/L (ref 0–53)
AST: 19 U/L (ref 0–37)
Albumin: 4.5 g/dL (ref 3.5–5.2)
Alkaline Phosphatase: 55 U/L (ref 39–117)
BUN: 25 mg/dL — AB (ref 6–23)
CALCIUM: 10 mg/dL (ref 8.4–10.5)
CHLORIDE: 101 meq/L (ref 96–112)
CO2: 27 mEq/L (ref 19–32)
Creatinine, Ser: 1.8 mg/dL — ABNORMAL HIGH (ref 0.4–1.5)
GFR: 48.49 mL/min — ABNORMAL LOW (ref >60.00)
GLUCOSE: 89 mg/dL (ref 70–99)
POTASSIUM: 5.1 meq/L (ref 3.5–5.1)
SODIUM: 136 meq/L (ref 135–145)
Total Bilirubin: 0.8 mg/dL (ref 0.2–1.2)
Total Protein: 8.1 g/dL (ref 6.0–8.3)

## 2013-09-20 LAB — SEDIMENTATION RATE: SED RATE: 12 mm/h (ref 0–22)

## 2013-09-20 LAB — TSH: TSH: 44.14 u[IU]/mL — AB (ref 0.35–4.50)

## 2013-09-20 LAB — HEMOGLOBIN A1C: HEMOGLOBIN A1C: 6.6 % — AB (ref 4.6–6.5)

## 2013-09-20 LAB — RHEUMATOID FACTOR

## 2013-09-20 LAB — CK: CK TOTAL: 137 U/L (ref 7–232)

## 2013-09-20 MED ORDER — LEVOTHYROXINE SODIUM 200 MCG PO TABS: 200.0000 ug | ORAL_TABLET | Freq: Every day | ORAL | Status: DC

## 2013-09-20 NOTE — Progress Notes (Signed)
Subjective:    Patient ID: Devon Brady, male    DOB: [birth year], 63 y.o.   MRN: [id number]  Thyroid Problem Presents for follow-up visit. Symptoms include fatigue and weight loss. Patient reports no anxiety, cold intolerance, constipation, depressed mood, diaphoresis, diarrhea, dry skin, hair loss, heat intolerance, hoarse voice, leg swelling, nail problem, palpitations, tremors, visual change or weight gain. The symptoms have been worsening. Past treatments include levothyroxine. The treatment provided mild relief. Prior procedures include thyroidectomy. His past medical history is significant for obesity. There is no history of atrial fibrillation, dementia, diabetes, Graves' ophthalmopathy, heart failure, hyperlipidemia or neuropathy.      Review of Systems  Constitutional: Positive for weight loss and fatigue. Negative for fever, chills, weight gain, diaphoresis, activity change, appetite change and unexpected weight change.  HENT: Negative.  Negative for congestion, dental problem, drooling, ear discharge, ear pain, facial swelling, hearing loss, hoarse voice, mouth sores, nosebleeds, postnasal drip, rhinorrhea, sinus pressure, sneezing, sore throat, tinnitus, trouble swallowing and voice change.        He complains that he has been smelling coffee for the last 2 weeks, he wants to have a scan done of his brain to see if there is a problem there.  Eyes: Negative.   Respiratory: Negative.  Negative for apnea, cough, choking, chest tightness, shortness of breath, wheezing and stridor.   Cardiovascular: Negative.  Negative for chest pain, palpitations and leg swelling.  Gastrointestinal: Positive for nausea. Negative for vomiting, abdominal pain, diarrhea, constipation, blood in stool, abdominal distention and anal bleeding.  Endocrine: Negative.  Negative for cold intolerance, heat intolerance, polydipsia, polyphagia and polyuria.  Genitourinary: Negative.   Musculoskeletal: Positive  for myalgias. Negative for arthralgias, back pain, gait problem, joint swelling, neck pain and neck stiffness.  Skin: Negative.  Negative for color change, pallor, rash and wound.       Tick bite left groin about 6 weeks ago, the tick was removed but he has a small nodule there that he is concerned about.  Allergic/Immunologic: Negative.   Neurological: Negative.  Negative for dizziness, tremors, syncope, light-headedness, numbness and headaches.  Hematological: Negative.  Negative for adenopathy. Does not bruise/bleed easily.  Psychiatric/Behavioral: Negative.        Objective:   Physical Exam  Vitals reviewed. Constitutional: He is oriented to person, place, and time. He appears well-developed and well-nourished.  Non-toxic appearance. He does not have a sickly appearance. He does not appear ill. No distress.  HENT:  Head: Normocephalic and atraumatic.  Mouth/Throat: Oropharynx is clear and moist. No oropharyngeal exudate, posterior oropharyngeal edema, posterior oropharyngeal erythema or tonsillar abscesses.  Eyes: Conjunctivae are normal. Right eye exhibits no discharge. Left eye exhibits no discharge. No scleral icterus.  Neck: Trachea normal and normal range of motion. Neck supple. No JVD present. No tracheal deviation present. No thyromegaly present.  Cardiovascular: Normal rate, regular rhythm, normal heart sounds and intact distal pulses.  Exam reveals no gallop and no friction rub.   No murmur heard. Pulmonary/Chest: Effort normal and breath sounds normal. No stridor. No respiratory distress. He has no wheezes. He has no rales. He exhibits no tenderness.  Abdominal: Soft. Bowel sounds are normal. He exhibits no distension and no mass. There is no tenderness. There is no rebound and no guarding.  Genitourinary:     Musculoskeletal: Normal range of motion. He exhibits no edema and no tenderness.  Lymphadenopathy:    He has no cervical adenopathy.  Neurological: He is alert and  oriented to person, place, and time. He has normal reflexes. He displays normal reflexes. No cranial nerve deficit. He exhibits normal muscle tone. Coordination normal.  Skin: Skin is warm and dry. No rash noted. He is not diaphoretic. No erythema. No pallor.  Psychiatric: He has a normal mood and affect. His behavior is normal. Judgment and thought content normal.     Lab Results  Component Value Date   WBC 6.2 05/08/2013   HGB 16.3 05/08/2013   HCT 49.4 05/08/2013   PLT 209.0 05/08/2013   GLUCOSE 84 05/08/2013   CHOL 136 05/08/2013   TRIG 71.0 05/08/2013   HDL 39.60 05/08/2013   LDLCALC 82 05/08/2013   ALT 18 05/08/2013   ALT 18 05/08/2013   AST 22 05/08/2013   AST 22 05/08/2013   NA 140 05/08/2013   K 4.8 05/08/2013   CL 105 05/08/2013   CREATININE 1.5 05/08/2013   BUN 21 05/08/2013   CO2 28 05/08/2013   TSH 0.35 05/08/2013   PSA 1.29 11/22/2012   INR 1.08 06/10/2011   HGBA1C 6.5 05/08/2013       Assessment & Plan:

## 2013-09-20 NOTE — Patient Instructions (Signed)
Hypothyroidism The thyroid is a large gland located in the lower front of your neck. The thyroid gland helps control metabolism. Metabolism is how your body handles food. It controls metabolism with the hormone thyroxine. When this gland is underactive (hypothyroid), it produces too little hormone.  CAUSES These include:   Absence or destruction of thyroid tissue.  Goiter due to iodine deficiency.  Goiter due to medications.  Congenital defects (since birth).  Problems with the pituitary. This causes a lack of TSH (thyroid stimulating hormone). This hormone tells the thyroid to turn out more hormone. SYMPTOMS  Lethargy (feeling as though you have no energy)  Cold intolerance  Weight gain (in spite of normal food intake)  Dry skin  Coarse hair  Menstrual irregularity (if severe, may lead to infertility)  Slowing of thought processes Cardiac problems are also caused by insufficient amounts of thyroid hormone. Hypothyroidism in the newborn is cretinism, and is an extreme form. It is important that this form be treated adequately and immediately or it will lead rapidly to retarded physical and mental development. DIAGNOSIS  To prove hypothyroidism, your caregiver may do blood tests and ultrasound tests. Sometimes the signs are hidden. It may be necessary for your caregiver to watch this illness with blood tests either before or after diagnosis and treatment. TREATMENT  Low levels of thyroid hormone are increased by using synthetic thyroid hormone. This is a safe, effective treatment. It usually takes about four weeks to gain the full effects of the medication. After you have the full effect of the medication, it will generally take another four weeks for problems to leave. Your caregiver may start you on low doses. If you have had heart problems the dose may be gradually increased. It is generally not an emergency to get rapidly to normal. HOME CARE INSTRUCTIONS   Take your  medications as your caregiver suggests. Let your caregiver know of any medications you are taking or start taking. Your caregiver will help you with dosage schedules.  As your condition improves, your dosage needs may increase. It will be necessary to have continuing blood tests as suggested by your caregiver.  Report all suspected medication side effects to your caregiver. SEEK MEDICAL CARE IF: Seek medical care if you develop:  Sweating.  Tremulousness (tremors).  Anxiety.  Rapid weight loss.  Heat intolerance.  Emotional swings.  Diarrhea.  Weakness. SEEK IMMEDIATE MEDICAL CARE IF:  You develop chest pain, an irregular heart beat (palpitations), or a rapid heart beat. MAKE SURE YOU:   Understand these instructions.  Will watch your condition.  Will get help right away if you are not doing well or get worse. Document Released: 03/01/2005 Document Revised: 05/24/2011 Document Reviewed: 10/20/2007 Exit[employer] Patient Information 2015 [employer]. This information is not intended to replace advice given to you by your health care provider. Make sure you discuss any questions you have with your health care provider.  

## 2013-09-20 NOTE — Progress Notes (Signed)
Pre visit review using our clinic review tool, if applicable. No additional management support is needed unless otherwise documented below in the visit note. 

## 2013-09-21 ENCOUNTER — Other Ambulatory Visit: Payer: Self-pay | Admitting: Internal Medicine

## 2013-09-21 ENCOUNTER — Encounter (HOSPITAL_COMMUNITY)
Admission: RE | Admit: 2013-09-21 | Discharge: 2013-09-21 | Disposition: A | Discharge status: Home / Self Care | Payer: BC Managed Care – PPO | Source: Ambulatory Visit | Attending: Endocrinology | Admitting: Endocrinology

## 2013-09-21 ENCOUNTER — Encounter: Payer: Self-pay | Admitting: Internal Medicine

## 2013-09-21 DIAGNOSIS — E890 Postprocedural hypothyroidism: Secondary | ICD-10-CM

## 2013-09-21 LAB — ROCKY MTN SPOTTED FVR ABS PNL(IGG+IGM)
RMSF IgG: 0.33 IV
RMSF IgM: 0.47 IV

## 2013-09-21 LAB — ANA: Anti Nuclear Antibody(ANA): NEGATIVE

## 2013-09-21 LAB — B. BURGDORFI ANTIBODIES BY WB
B BURGDORFERI IGM ABS (IB): NEGATIVE
B burgdorferi IgG Abs (IB): NEGATIVE

## 2013-09-21 LAB — CYCLIC CITRUL PEPTIDE ANTIBODY, IGG: Cyclic Citrullin Peptide Ab: <2 U/mL (ref 0.0–5.0)

## 2013-09-21 MED ORDER — LEVOTHYROXINE SODIUM 100 MCG PO TABS: 100.0000 ug | ORAL_TABLET | Freq: Every day | ORAL | Status: DC

## 2013-09-21 NOTE — Assessment & Plan Note (Signed)
He has achieved his LDL goal 

## 2013-09-21 NOTE — Assessment & Plan Note (Signed)
His blood sugars are well controlled 

## 2013-09-21 NOTE — Assessment & Plan Note (Signed)
His TSH is high and he tells me that he is taking synthroid Will add another 100 mcg to his regimen

## 2013-09-21 NOTE — Assessment & Plan Note (Signed)
At this request I will check his labs to look for tick borne illness

## 2013-09-21 NOTE — Assessment & Plan Note (Signed)
This appears to be caused by the hypothyroidism, will raise his synthroid dose Labs will also be checked to look for myopathy, connective tissue disease, etc

## 2013-09-21 NOTE — Assessment & Plan Note (Signed)
I will check his labs to look for secondary causes Will also scan his brain with an MRI to look for mass, mets, infection, etc

## 2013-09-24 ENCOUNTER — Other Ambulatory Visit: Payer: Self-pay | Admitting: Internal Medicine

## 2013-09-26 ENCOUNTER — Telehealth: Payer: Self-pay | Admitting: Internal Medicine

## 2013-09-26 NOTE — Telephone Encounter (Signed)
LMOVM advising per MD. 

## 2013-09-26 NOTE — Telephone Encounter (Signed)
Olin Hauser from [facility] is calling in regards to the patient's CT that he is coming in for next week. The order is for w/o contrast but they believe it would be better with contrast. Olin Hauser also wants to know what Dr. Ronnald Ramp is looking for on the CT to determine if it is needing to be with contrast. Please call back to advise. She says to ask to speak with her when calling.

## 2013-09-26 NOTE — Telephone Encounter (Signed)
I ordered am MRI of his brain I believe someone else ordered the CT scan

## 2013-10-01 ENCOUNTER — Ambulatory Visit
Admission: RE | Admit: 2013-10-01 | Discharge: 2013-10-01 | Disposition: A | Discharge status: Home / Self Care | Payer: No Typology Code available for payment source | Source: Ambulatory Visit | Attending: Internal Medicine | Admitting: Internal Medicine

## 2013-10-01 DIAGNOSIS — R439 Unspecified disturbances of smell and taste: Secondary | ICD-10-CM

## 2013-10-01 DIAGNOSIS — C73 Malignant neoplasm of thyroid gland: Secondary | ICD-10-CM

## 2013-10-01 DIAGNOSIS — C8190 Hodgkin lymphoma, unspecified, unspecified site: Secondary | ICD-10-CM

## 2013-10-01 MED ORDER — GADOBENATE DIMEGLUMINE 529 MG/ML IV SOLN
10.0000 mL | Freq: Once | INTRAVENOUS | Status: AC | PRN
  Administered 2013-10-01: 10 mL via INTRAVENOUS

## 2013-10-02 ENCOUNTER — Emergency Department (HOSPITAL_COMMUNITY): Payer: BC Managed Care – PPO

## 2013-10-02 ENCOUNTER — Encounter (HOSPITAL_COMMUNITY): Payer: Self-pay | Admitting: Emergency Medicine

## 2013-10-02 ENCOUNTER — Inpatient Hospital Stay (HOSPITAL_COMMUNITY)
Admission: EM | Admit: 2013-10-02 | Discharge: 2013-10-04 | DRG: 281 | Disposition: A | Discharge status: Home / Self Care | Payer: BC Managed Care – PPO | Attending: Cardiology | Admitting: Cardiology

## 2013-10-02 DIAGNOSIS — N529 Male erectile dysfunction, unspecified: Secondary | ICD-10-CM | POA: Diagnosis present

## 2013-10-02 DIAGNOSIS — Z860100 Personal history of colon polyps, unspecified: Secondary | ICD-10-CM

## 2013-10-02 DIAGNOSIS — Z85850 Personal history of malignant neoplasm of thyroid: Secondary | ICD-10-CM

## 2013-10-02 DIAGNOSIS — I25810 Atherosclerosis of coronary artery bypass graft(s) without angina pectoris: Secondary | ICD-10-CM | POA: Diagnosis present

## 2013-10-02 DIAGNOSIS — E039 Hypothyroidism, unspecified: Secondary | ICD-10-CM | POA: Diagnosis present

## 2013-10-02 DIAGNOSIS — Z6837 Body mass index (BMI) 37.0-37.9, adult: Secondary | ICD-10-CM

## 2013-10-02 DIAGNOSIS — Z8673 Personal history of transient ischemic attack (TIA), and cerebral infarction without residual deficits: Secondary | ICD-10-CM

## 2013-10-02 DIAGNOSIS — Z87898 Personal history of other specified conditions: Secondary | ICD-10-CM

## 2013-10-02 DIAGNOSIS — I208 Other forms of angina pectoris: Secondary | ICD-10-CM | POA: Diagnosis present

## 2013-10-02 DIAGNOSIS — E669 Obesity, unspecified: Secondary | ICD-10-CM | POA: Diagnosis present

## 2013-10-02 DIAGNOSIS — G4730 Sleep apnea, unspecified: Secondary | ICD-10-CM | POA: Diagnosis present

## 2013-10-02 DIAGNOSIS — Z7982 Long term (current) use of aspirin: Secondary | ICD-10-CM

## 2013-10-02 DIAGNOSIS — C8190 Hodgkin lymphoma, unspecified, unspecified site: Secondary | ICD-10-CM

## 2013-10-02 DIAGNOSIS — I2089 Other forms of angina pectoris: Secondary | ICD-10-CM | POA: Diagnosis present

## 2013-10-02 DIAGNOSIS — E119 Type 2 diabetes mellitus without complications: Secondary | ICD-10-CM | POA: Diagnosis present

## 2013-10-02 DIAGNOSIS — Z8249 Family history of ischemic heart disease and other diseases of the circulatory system: Secondary | ICD-10-CM

## 2013-10-02 DIAGNOSIS — I200 Unstable angina: Secondary | ICD-10-CM | POA: Diagnosis present

## 2013-10-02 DIAGNOSIS — I2582 Chronic total occlusion of coronary artery: Secondary | ICD-10-CM | POA: Diagnosis present

## 2013-10-02 DIAGNOSIS — I214 Non-ST elevation (NSTEMI) myocardial infarction: Secondary | ICD-10-CM

## 2013-10-02 DIAGNOSIS — Y832 Surgical operation with anastomosis, bypass or graft as the cause of abnormal reaction of the patient, or of later complication, without mention of misadventure at the time of the procedure: Secondary | ICD-10-CM | POA: Diagnosis present

## 2013-10-02 DIAGNOSIS — Z833 Family history of diabetes mellitus: Secondary | ICD-10-CM

## 2013-10-02 DIAGNOSIS — E785 Hyperlipidemia, unspecified: Secondary | ICD-10-CM | POA: Diagnosis present

## 2013-10-02 DIAGNOSIS — I2510 Atherosclerotic heart disease of native coronary artery without angina pectoris: Secondary | ICD-10-CM | POA: Diagnosis present

## 2013-10-02 DIAGNOSIS — N189 Chronic kidney disease, unspecified: Secondary | ICD-10-CM | POA: Diagnosis present

## 2013-10-02 DIAGNOSIS — I129 Hypertensive chronic kidney disease with stage 1 through stage 4 chronic kidney disease, or unspecified chronic kidney disease: Secondary | ICD-10-CM | POA: Diagnosis present

## 2013-10-02 DIAGNOSIS — T82897A Other specified complication of cardiac prosthetic devices, implants and grafts, initial encounter: Principal | ICD-10-CM | POA: Diagnosis present

## 2013-10-02 HISTORY — DX: Hypothyroidism, unspecified: E03.9

## 2013-10-02 HISTORY — DX: Acute myocardial infarction, unspecified: I21.9

## 2013-10-02 LAB — COMPREHENSIVE METABOLIC PANEL
ALT: 11 U/L (ref 0–53)
AST: 16 U/L (ref 0–37)
Albumin: 3.7 g/dL (ref 3.5–5.2)
Alkaline Phosphatase: 53 U/L (ref 39–117)
Anion gap: 12 (ref 5–15)
BILIRUBIN TOTAL: 0.4 mg/dL (ref 0.3–1.2)
BUN: 19 mg/dL (ref 6–23)
CHLORIDE: 103 meq/L (ref 96–112)
CO2: 28 meq/L (ref 19–32)
CREATININE: 1.49 mg/dL — AB (ref 0.50–1.35)
Calcium: 9.4 mg/dL (ref 8.4–10.5)
GFR calc Af Amer: 57 mL/min — ABNORMAL LOW (ref >90)
GFR, EST NON AFRICAN AMERICAN: 49 mL/min — AB (ref >90)
Glucose, Bld: 110 mg/dL — ABNORMAL HIGH (ref 70–99)
Potassium: 4.4 mEq/L (ref 3.7–5.3)
SODIUM: 143 meq/L (ref 137–147)
Total Protein: 7.1 g/dL (ref 6.0–8.3)

## 2013-10-02 LAB — CBC WITH DIFFERENTIAL/PLATELET
BASOS ABS: 0 10*3/uL (ref 0.0–0.1)
Basophils Relative: 0 % (ref 0–1)
Eosinophils Absolute: 0.2 10*3/uL (ref 0.0–0.7)
Eosinophils Relative: 3 % (ref 0–5)
HCT: 44.2 % (ref 39.0–52.0)
Hemoglobin: 14.6 g/dL (ref 13.0–17.0)
LYMPHS ABS: 2 10*3/uL (ref 0.7–4.0)
Lymphocytes Relative: 30 % (ref 12–46)
MCH: 29.1 pg (ref 26.0–34.0)
MCHC: 33 g/dL (ref 30.0–36.0)
MCV: 88.2 fL (ref 78.0–100.0)
MONO ABS: 0.5 10*3/uL (ref 0.1–1.0)
Monocytes Relative: 7 % (ref 3–12)
Neutro Abs: 4.1 10*3/uL (ref 1.7–7.7)
Neutrophils Relative %: 60 % (ref 43–77)
Platelets: 165 10*3/uL (ref 150–400)
RBC: 5.01 MIL/uL (ref 4.22–5.81)
RDW: 14.9 % (ref 11.5–15.5)
WBC: 6.8 10*3/uL (ref 4.0–10.5)

## 2013-10-02 LAB — PROTIME-INR
INR: 0.97 (ref 0.00–1.49)
Prothrombin Time: 12.9 seconds (ref 11.6–15.2)

## 2013-10-02 LAB — TSH: TSH: 0.155 u[IU]/mL — AB (ref 0.350–4.500)

## 2013-10-02 LAB — MAGNESIUM: MAGNESIUM: 1.9 mg/dL (ref 1.5–2.5)

## 2013-10-02 LAB — TROPONIN I: TROPONIN I: 1.1 ng/mL — AB (ref <0.30)

## 2013-10-02 LAB — I-STAT TROPONIN, ED: Troponin i, poc: 0 ng/mL (ref 0.00–0.08)

## 2013-10-02 MED ORDER — ONDANSETRON HCL 4 MG/2ML IJ SOLN
4.0000 mg | Freq: Four times a day (QID) | INTRAMUSCULAR | Status: DC | PRN
Start: 2013-10-02 — End: 2013-10-04
  Administered 2013-10-03: 18:00:00 4 mg via INTRAVENOUS
  Filled 2013-10-02: qty 2

## 2013-10-02 MED ORDER — ZOLPIDEM TARTRATE 5 MG PO TABS: 5.0000 mg | ORAL_TABLET | Freq: Every evening | ORAL | Status: DC | PRN

## 2013-10-02 MED ORDER — NITROGLYCERIN 2 % TD OINT
1.0000 [in_us] | TOPICAL_OINTMENT | Freq: Four times a day (QID) | TRANSDERMAL | Status: DC
  Administered 2013-10-02 – 2013-10-03 (×2): 1 [in_us] via TOPICAL
  Filled 2013-10-02: qty 30

## 2013-10-02 MED ORDER — SODIUM CHLORIDE 0.9 % IJ SOLN: 3.0000 mL | Freq: Two times a day (BID) | INTRAMUSCULAR | Status: DC

## 2013-10-02 MED ORDER — ACETAMINOPHEN 325 MG PO TABS
650.0000 mg | ORAL_TABLET | ORAL | Status: DC | PRN
  Administered 2013-10-03: 15:00:00 650 mg via ORAL
  Filled 2013-10-02: qty 2

## 2013-10-02 MED ORDER — ASPIRIN 300 MG RE SUPP
300.0000 mg | RECTAL | Status: DC
  Filled 2013-10-02: qty 1

## 2013-10-02 MED ORDER — NITROGLYCERIN 0.4 MG SL SUBL: 0.4000 mg | SUBLINGUAL_TABLET | SUBLINGUAL | Status: DC | PRN

## 2013-10-02 MED ORDER — SODIUM CHLORIDE 0.9 % IV SOLN: 250.0000 mL | INTRAVENOUS | Status: DC | PRN

## 2013-10-02 MED ORDER — SODIUM CHLORIDE 0.9 % IJ SOLN: 3.0000 mL | INTRAMUSCULAR | Status: DC | PRN

## 2013-10-02 MED ORDER — HEPARIN BOLUS VIA INFUSION
4000.0000 [IU] | Freq: Once | INTRAVENOUS | Status: AC
  Administered 2013-10-02: 4000 [IU] via INTRAVENOUS
  Filled 2013-10-02: qty 4000

## 2013-10-02 MED ORDER — ASPIRIN 81 MG PO CHEW: 324.0000 mg | CHEWABLE_TABLET | ORAL | Status: DC

## 2013-10-02 MED ORDER — ADULT MULTIVITAMIN W/MINERALS CH
1.0000 | ORAL_TABLET | Freq: Every day | ORAL | Status: DC
  Administered 2013-10-03 – 2013-10-04 (×2): 1 via ORAL
  Filled 2013-10-02 (×2): qty 1

## 2013-10-02 MED ORDER — HEPARIN (PORCINE) IN NACL 100-0.45 UNIT/ML-% IJ SOLN
1350.0000 [IU]/h | INTRAMUSCULAR | Status: DC
  Administered 2013-10-02: 1350 [IU]/h via INTRAVENOUS
  Filled 2013-10-02 (×2): qty 250

## 2013-10-02 MED ORDER — ASPIRIN EC 81 MG PO TBEC: 81.0000 mg | DELAYED_RELEASE_TABLET | Freq: Every day | ORAL | Status: DC

## 2013-10-02 MED ORDER — ASPIRIN 81 MG PO CHEW: 81.0000 mg | CHEWABLE_TABLET | ORAL | Status: AC

## 2013-10-02 MED ORDER — LEVOTHYROXINE SODIUM 112 MCG PO TABS
224.0000 ug | ORAL_TABLET | Freq: Every day | ORAL | Status: DC
  Administered 2013-10-03 – 2013-10-04 (×2): 224 ug via ORAL
  Filled 2013-10-02 (×3): qty 2

## 2013-10-02 MED ORDER — METOPROLOL SUCCINATE ER 50 MG PO TB24
50.0000 mg | ORAL_TABLET | Freq: Every day | ORAL | Status: DC
  Administered 2013-10-03 – 2013-10-04 (×2): 50 mg via ORAL
  Filled 2013-10-02 (×2): qty 1

## 2013-10-02 MED ORDER — SODIUM CHLORIDE 0.9 % IV SOLN: INTRAVENOUS | Status: DC

## 2013-10-02 MED ORDER — NITROGLYCERIN 0.4 MG SL SUBL
0.4000 mg | SUBLINGUAL_TABLET | SUBLINGUAL | Status: DC | PRN
  Administered 2013-10-02 (×2): 0.4 mg via SUBLINGUAL
  Filled 2013-10-02: qty 1

## 2013-10-02 MED ORDER — SODIUM CHLORIDE 0.9 % IV SOLN
1.0000 mL/kg/h | INTRAVENOUS | Status: DC
  Administered 2013-10-02: 20:00:00 1 mL/kg/h via INTRAVENOUS

## 2013-10-02 MED ORDER — ASPIRIN EC 81 MG PO TBEC
81.0000 mg | DELAYED_RELEASE_TABLET | Freq: Every day | ORAL | Status: DC
  Administered 2013-10-03 – 2013-10-04 (×2): 81 mg via ORAL
  Filled 2013-10-02 (×2): qty 1

## 2013-10-02 NOTE — ED Notes (Signed)
Patient transported to X-ray 

## 2013-10-02 NOTE — ED Notes (Signed)
Per [facility], pt from work for mid cp with radiation to left arm. Pt states he had onset nausea, lightheadedness, and SOB. Pain initially 7/10, after 324 mg ASA, 4 NTG, and 1 inch paste to Rt ACW, pain 2/10 currently. o2 at 2 liters, 18g to RAC.

## 2013-10-02 NOTE — H&P (Signed)
CARDIOLOGY ADMISSION NOTE  Patient ID: Devon Brady MRN: [id number] DOB/AGE: 1950-12-23 63 y.o.  Admit date: 10/02/2013 Primary Physician   Scarlette Calico, MD Primary Cardiologist   Dr. Angelena Form Chief Complaint    Chest pain.  HPI: The patient has a history of CABG in 2011.  Last echo had EF of 65% 2013.  He developed chest pain today.  This was substernal. 7/10. Radiating down his left arm. Happened at rest. Is nauseated with it. He did have some slight diaphoresis. Is not short of breath. He's not had pain like this before. It was not like previous reflux. He called EMS. They gave him 4 aspirin. He subsequently had 4 sublingual nitroglycerin glycerin as sprays and eventually had resolution of his discomfort.  She did have some T-wave inversions on his EKG which was new compared with previous. His initial enzymes were negative.  Of note interestingly he had an MRI yesterday because his been having a strong sense of smell and things that aren't there such as a smell of something brewing coffee when this hasn't actually happened.   Past Medical History  Diagnosis Date  . Hypertension   . Hyperlipidemia     takes Crestor nightly  . Anemia   . Hydronephrosis   . Renal insufficiency   . Dizziness     occasional for last week  . Hemorrhoid   . Sleep apnea     STOPBANG=5  . Hodgkin's lymphoma   . 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 H/O colonoscopy 2013  . Complication of anesthesia     slow to wake up  . CAD (coronary artery disease)     takes Metoprolol daily  . TIA (transient ischemic attack)     in 2011  . Back pain     occasionally   . H/O hiatal hernia   . Hemorrhoid   . History of colon polyps   . Nocturia   . 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 Vasectomy  1980  . Bunionectomy  1978    right foot  . Cardiac catheterization  10/15/2009  . Portacath placement   06/10/2011    Procedure: INSERTION PORT-A-CATH;  Surgeon: [name], MD;  Location: [facility];  Service: General;  Laterality: Left;  subclavian   . Lymph node biopsy  06/07/11    RIGHT INGUINAL NODE: CLASSICAL HODGKIN'S LYMPHOMA, NODULAR SCLEROSIS TYPE  . Bone marrow aspirate,biopsy, and clot  06/11/11    LEFT ILIAC CREAST  . Tonsillectomy      as a child  . Coronary artery bypass graft  10/17/2009    LIMA to LAD,SVG to Ramus,SVG to OM sequential to OM2, SVG to marginal of RCA  . Colonoscopy    . Esophagogastroduodenoscopy    . Thyroidectomy  03/31/2012    Procedure: THYROIDECTOMY;  Surgeon: Izora Gala, MD;  Location: [facility];  Service: ENT;  Laterality: N/A;    Allergies  Allergen Reactions  . Crestor [Rosuvastatin]     Muscle aches   No current facility-administered medications on file prior to encounter.   Current Outpatient Prescriptions on File Prior to Encounter  Medication Sig Dispense Refill  . aspirin EC 81 MG tablet Take 81 mg by mouth daily.      . fluticasone (FLONASE) 50 MCG/ACT nasal spray Place 2 sprays into the nose daily as needed. For allergies      .  levothyroxine (SYNTHROID, LEVOTHROID) 100 MCG tablet Take 1 tablet (100 mcg total) by mouth daily.  90 tablet  0  . levothyroxine (SYNTHROID, LEVOTHROID) 200 MCG tablet Take 1 tablet (200 mcg total) by mouth daily.  90 tablet  1  . lidocaine-prilocaine (EMLA) cream Apply topically as needed. Apply to port a cath one hour before needle stick as needed.  30 g  2  . metoprolol succinate (TOPROL-XL) 50 MG 24 hr tablet TAKE 1 TABLET EVERY DAY TAKE BEFORE BREAKFAST WITH OR IMMEDIATELY FOLLOWING A MEAL  90 tablet  3  . nitroGLYCERIN (NITROSTAT) 0.4 MG SL tablet Place 0.4 mg under the tongue every 5 (five) minutes x 3 doses as needed. For chest pain      . OVER THE COUNTER MEDICATION Take 1 tablet by mouth daily. OTC Probiotic: JARRO-dophilus      . tadalafil (CIALIS) 20 MG tablet Take 1 tablet (20 mg total) by mouth daily as needed  for erectile dysfunction.  9 tablet  0  . [DISCONTINUED] citalopram (CELEXA) 20 MG tablet Take 1 tablet (20 mg total) by mouth daily.  30 tablet  1  . [DISCONTINUED] omeprazole (PRILOSEC OTC) 20 MG tablet Take 1 tablet (20 mg total) by mouth daily.  28 tablet  1   History   Social History  . Marital Status: Married    Spouse Name: N/A    Number of Children: 2  . Years of Education: N/A   Occupational History  . 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    Social History Main Topics  . Smoking status: Never Smoker   . Smokeless tobacco: Never Used  . Alcohol Use: 0.6 oz/week    1 Cans of beer per week     Comment: occasionally  . Drug Use: No  . Sexual Activity: Yes   Other Topics Concern  . Not on file   Social History Narrative  . No narrative on file    Family History  Problem Relation Age of Onset  . Alcohol abuse    . Hyperlipidemia    . Arthritis Mother   . Hypertension Mother   . Diabetes Father   . Coronary artery disease Father 56    dscd  . Kidney disease Father   . Heart disease Father   . Prostate cancer Paternal Uncle     Great  . Hypertension Sister   . Hypertension Sister     2nd Sister  . Alcohol abuse Brother   . Cancer Neg Hx   . Drug abuse Neg Hx   . Early death Neg Hx   . Stroke Neg Hx      ROS:  As stated in the HPI and negative for all other systems.    Physical Exam: Blood pressure 119/75, pulse 72, resp. rate 21, height 5\' 10"  (1.778 m), weight 260 lb (117.935 kg), SpO2 100.00%.  GENERAL:  Well appearing HEENT:  Pupils equal round and reactive, fundi not visualized, oral mucosa unremarkable NECK:  No jugular venous distention, waveform within normal limits, carotid upstroke brisk and symmetric, no bruits, no thyromegaly LYMPHATICS:  No cervical, inguinal adenopathy LUNGS:  Clear to auscultation bilaterally BACK:  No CVA tenderness CHEST:  Well healed sternotomy scar. HEART:  PMI not displaced or sustained,S1 and S2 within  normal limits, no S3, no S4, no clicks, no rubs, no murmurs ABD:  Flat, positive bowel sounds normal in frequency in pitch, no bruits, no rebound, no guarding, no midline pulsatile mass, no  hepatomegaly, no splenomegaly EXT:  2 plus pulses throughout, no edema, no cyanosis no clubbing SKIN:  No rashes no nodules NEURO:  Cranial nerves II through XII grossly intact, motor grossly intact throughout PSYCH:  Cognitively intact, oriented to person place and time  Labs: Lab Results  Component Value Date   BUN 19 10/02/2013   Lab Results  Component Value Date   CREATININE 1.49* 10/02/2013   Lab Results  Component Value Date   NA 143 10/02/2013   K 4.4 10/02/2013   CL 103 10/02/2013   CO2 28 10/02/2013   Lab Results  Component Value Date   WBC 6.8 10/02/2013   HGB 14.6 10/02/2013   HCT 44.2 10/02/2013   MCV 88.2 10/02/2013   PLT 165 10/02/2013    Lab Results  Component Value Date   ALT 11 10/02/2013   AST 16 10/02/2013   ALKPHOS 53 10/02/2013   BILITOT 0.4 10/02/2013     Radiology:  CXR:  The heart size and mediastinal contours are within normal limits.  Both lungs are clear. The visualized skeletal structures are  unremarkable.   EKG:  Normal sinus rhythm, rate 72, anterior T-wave inversions possibly consistent with ischemia, intervals within normal limits, axis within normal limits.  This is new compared to EKG from August of last year.  ASSESSMENT AND PLAN:    CHEST PAIN:  Pain is consistent with unstable angina. There are some T-wave inversions. We heparinized. Cardiac catheterization. The patient understands that risks included but are not limited to stroke (1 in 1000), death (1 in 87), kidney failure [usually temporary] (1 in 500), bleeding (1 in 200), allergic reaction [possibly serious] (1 in 200).  The patient understands and agrees to proceed.   We did discuss the risk of renal insufficiency he was dehydrated. Given his renal insufficiency I we will need to minimize dye.  he  will continue on nitroglycerin paste. He will use heparin. We will continue aspirin.  DYSLIPIDEMIA:  This was checked in Feb with an LDL of 82.   He did come off of Crestor but is willing to start Lipitor. Crestor was stopped because of muscle aches.  you will be placed on Lipitor 40 mg daily.   HTN: New continue on his meds as previously listed.  CKD:  This is a followed closely with minimizing dye and hydration.   I would hydrate well prior to the procedure.  THYROID:  Continue previous meds and check a thyroid profile.  SignedMinus Breeding 10/02/2013, 4:19 PM

## 2013-10-02 NOTE — ED Provider Notes (Signed)
CSN: [id number]     Arrival date & time 10/02/13  1513 History   First MD Initiated Contact with Patient 10/02/13 1520     Chief Complaint  Patient presents with  . Chest Pain     (Consider location/radiation/quality/duration/timing/severity/associated sxs/prior Treatment) The history is provided by the patient.  Devon Brady is a 63 y.o. male hx of HTN, HL, CAD s/p CABG here with chest pain. Substernal chest pain when he was talking to his brother earlier today. Radiate to his left arm and associated with nausea and shortness of breath and diaphoresis. It was a pressure sensation. Given ASA 324 mg by EMS, nitro, now pain 2/10. Has hx of CABG.    Past Medical History  Diagnosis Date  . Hypertension   . Hyperlipidemia     takes Crestor nightly  . Anemia   . Hydronephrosis   . Renal insufficiency   . Dizziness     occasional for last week  . Hemorrhoid   . Sleep apnea     STOPBANG=5  . Hodgkin's lymphoma   . 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 H/O colonoscopy 2013  . Complication of anesthesia     slow to wake up  . CAD (coronary artery disease)     takes Metoprolol daily  . TIA (transient ischemic attack)     in 2011  . Back pain     occasionally   . H/O hiatal hernia   . Hemorrhoid   . History of colon polyps   . Nocturia   . 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 Vasectomy  1980  . Bunionectomy  1978    right foot  . Cardiac catheterization  10/15/2009  . Portacath placement  06/10/2011    Procedure: INSERTION PORT-A-CATH;  Surgeon: [name], MD;  Location: [facility];  Service: General;  Laterality: Left;  subclavian   . Lymph node biopsy  06/07/11    RIGHT INGUINAL NODE: CLASSICAL HODGKIN'S LYMPHOMA, NODULAR SCLEROSIS TYPE  . Bone marrow aspirate,biopsy, and clot  06/11/11    LEFT ILIAC CREAST  . Tonsillectomy      as a child  . Coronary artery bypass graft   10/17/2009    LIMA to LAD,SVG to Ramus,SVG to OM sequential to OM2, SVG to marginal of RCA  . Colonoscopy    . Esophagogastroduodenoscopy    . Thyroidectomy  03/31/2012    Procedure: THYROIDECTOMY;  Surgeon: Izora Gala, MD;  Location: [facility];  Service: ENT;  Laterality: N/A;   Family History  Problem Relation Age of Onset  . Alcohol abuse    . Hyperlipidemia    . Arthritis Mother   . Hypertension Mother   . Diabetes Father   . Coronary artery disease Father 71    dscd  . Kidney disease Father   . Heart disease Father   . Prostate cancer Paternal Uncle     Great  . Hypertension Sister   . Hypertension Sister     2nd Sister  . Alcohol abuse Brother   . Cancer Neg Hx   . Drug abuse Neg Hx   . Early death Neg Hx   . Stroke Neg Hx    History  Substance Use Topics  . Smoking status: Never Smoker   . Smokeless tobacco: Never Used  . Alcohol Use: 0.6 oz/week    1 Cans of beer  per week     Comment: occasionally    Review of Systems  Cardiovascular: Positive for chest pain.  All other systems reviewed and are negative.     Allergies  Crestor  Home Medications   Prior to Admission medications   Medication Sig Start Date End Date Taking? Authorizing Provider  aspirin EC 81 MG tablet Take 81 mg by mouth daily.    Historical Provider, MD  fluticasone (FLONASE) 50 MCG/ACT nasal spray Place 2 sprays into the nose daily as needed. For allergies    Historical Provider, MD  levothyroxine (SYNTHROID, LEVOTHROID) 100 MCG tablet Take 1 tablet (100 mcg total) by mouth daily. 09/21/13   Janith Lima, MD  levothyroxine (SYNTHROID, LEVOTHROID) 200 MCG tablet Take 1 tablet (200 mcg total) by mouth daily. 09/20/13   Janith Lima, MD  lidocaine-prilocaine (EMLA) cream Apply topically as needed. Apply to port a cath one hour before needle stick as needed. 07/21/12   Nobie Putnam, MD  metoprolol succinate (TOPROL-XL) 50 MG 24 hr tablet TAKE 1 TABLET EVERY DAY TAKE BEFORE BREAKFAST WITH OR  IMMEDIATELY FOLLOWING A MEAL 07/08/13   Janith Lima, MD  nitroGLYCERIN (NITROSTAT) 0.4 MG SL tablet Place 0.4 mg under the tongue every 5 (five) minutes x 3 doses as needed. For chest pain    Historical Provider, MD  OVER THE COUNTER MEDICATION Take 1 tablet by mouth daily. OTC Probiotic: JARRO-dophilus    Historical Provider, MD  tadalafil (CIALIS) 20 MG tablet Take 1 tablet (20 mg total) by mouth daily as needed for erectile dysfunction. 03/14/12   Janith Lima, MD   Ht 5\' 10"  (1.778 m)  Wt 260 lb (117.935 kg)  BMI 37.31 kg/m2 Physical Exam  Nursing note and vitals reviewed. Constitutional: He is oriented to person, place, and time. He appears well-nourished.  Slightly uncomfortable   HENT:  Head: Normocephalic.  Mouth/Throat: Oropharynx is clear and moist.  Eyes: Conjunctivae and EOM are normal. Pupils are equal, round, and reactive to light.  Neck: Normal range of motion. Neck supple.  Cardiovascular: Normal rate, regular rhythm and normal heart sounds.   Pulmonary/Chest: Effort normal and breath sounds normal. No respiratory distress. He has no wheezes. He has no rales. He exhibits no tenderness.  Abdominal: Soft. Bowel sounds are normal. He exhibits no distension. There is no tenderness. There is no rebound and no guarding.  Musculoskeletal: Normal range of motion. He exhibits no edema and no tenderness.  Neurological: He is alert and oriented to person, place, and time. No cranial nerve deficit. Coordination normal.  Skin: Skin is warm and dry.  Psychiatric: He has a normal mood and affect. His behavior is normal. Judgment and thought content normal.    ED Course  Procedures (including critical care time)  CRITICAL CARE Performed by: Darl Householder, Merced Brougham   Total critical care time: 30 min   Critical care time was exclusive of separately billable procedures and treating other patients.  Critical care was necessary to treat or prevent imminent or life-threatening  deterioration.  Critical care was time spent personally by me on the following activities: development of treatment plan with patient and/or surrogate as well as nursing, discussions with consultants, evaluation of patient's response to treatment, examination of patient, obtaining history from patient or surrogate, ordering and performing treatments and interventions, ordering and review of laboratory studies, ordering and review of radiographic studies, pulse oximetry and re-evaluation of patient's condition.   Labs Review Labs Reviewed  CBC WITH DIFFERENTIAL  COMPREHENSIVE METABOLIC PANEL  Randolm Idol, ED    Imaging Review Mr Jeri Cos Wo Contrast  10/01/2013   CLINICAL DATA:  Two-month history of smelling coffee brewing. History of Hodgkin's lymphoma and thyroid cancer.  EXAM: MRI HEAD WITHOUT AND WITH CONTRAST  TECHNIQUE: Multiplanar, multiecho pulse sequences of the brain and surrounding structures were obtained without and with intravenous contrast.  CONTRAST:  64mL MULTIHANCE GADOBENATE DIMEGLUMINE 529 MG/ML IV SOLN  COMPARISON:  None.  FINDINGS: The CSF containing spaces are within normal limits for patient age. No focal parenchymal signal abnormality is identified. No mass lesion, midline shift, or extra-axial fluid collection. Ventricles are normal in size without evidence of hydrocephalus.  No diffusion-weighted signal abnormality is identified to suggest acute intracranial infarct. Gray-white matter differentiation is maintained. Normal flow voids are seen within the intracranial vasculature. No intracranial hemorrhage identified.  The cervicomedullary junction is normal. Pituitary gland is within normal limits. Pituitary stalk is midline. The globes and optic nerves demonstrate a normal appearance with normal signal intensity.  Olfactory bulbs are normal in appearance. No mass lesion or abnormal enhancement seen within this region. Cribriform plate grossly normal.  No abnormal  enhancement seen within the brain.  The bone marrow signal intensity is normal. Calvarium is intact. Visualized upper cervical spine is within normal limits.  Scalp soft tissues are unremarkable.  Mild scattered mucoperiosteal thickening noted within the ethmoidal air cells bilaterally. Paranasal sinuses are otherwise clear. Minimal T2 hyperintensity seen within the right mastoid air cells.  IMPRESSION: Normal brain MRI with no findings to explain olfactory disturbance identified.   Electronically Signed   By: Jeannine Boga M.D.   On: 10/01/2013 21:55     EKG Interpretation   Date/Time:  Tuesday October 02 2013 15:19:42 EDT Ventricular Rate:  74 PR Interval:  161 QRS Duration: 100 QT Interval:  385 QTC Calculation: 427 R Axis:   39 Text Interpretation:  Sinus rhythm Probable left atrial enlargement  Anteroseptal infarct, age indeterminate No significant change since last  tracing Confirmed by Dakota Vanwart  MD, Reginal Wojcicki ([id number]) on 10/02/2013 3:27:33 PM      MDM   Final diagnoses:  None    Devon Brady is a 63 y.o. male here with chest pain. Concerned for possible ACS. He is high risk since he had previous CABG. Will need cardiology consult.   5:12 PM Cardiology saw patient. Thought there are some anterior TWI. Trop neg x 1. Will start heparin for unstable angina. Will admit.   Wandra Arthurs, MD 10/02/13 [phone]

## 2013-10-02 NOTE — Progress Notes (Signed)
ANTICOAGULATION CONSULT NOTE - Initial Consult  Pharmacy Consult for Heparin Indication: chest pain/ACS  Allergies  Allergen Reactions  . Crestor [Rosuvastatin]     Muscle aches    Patient Measurements: Height: 5\' 10"  (177.8 cm) Weight: 260 lb (117.935 kg) IBW/kg (Calculated) : 73 Heparin Dosing Weight:  99 kg  Vital Signs: Temp: 98 F (36.7 C) (07/21 1837) Temp src: Oral (07/21 1837) BP: 102/74 mmHg (07/21 1837) Pulse Rate: 73 (07/21 1837)  Labs:  Recent Labs  10/02/13 1535  HGB 14.6  HCT 44.2  PLT 165  CREATININE 1.49*    Estimated Creatinine Clearance: 67 ml/min (by C-G formula based on Cr of 1.49).   Medical History: Past Medical History  Diagnosis Date  . Hypertension   . Hyperlipidemia   . Anemia   . Hydronephrosis   . Renal insufficiency   . Hemorrhoid   . Sleep apnea     No CPAP  . Hodgkin's lymphoma   . 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 H/O colonoscopy 2013  . Complication of anesthesia     slow to wake up  . CAD (coronary artery disease)   . TIA (transient ischemic attack)     in 2011  . H/O hiatal hernia   . History of colon polyps   . Nocturia   . 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 aspirin EC 81 MG tablet Take 81 mg by mouth daily.      Marland Kitchen levothyroxine (SYNTHROID, LEVOTHROID) 112 MCG tablet Take 224 mcg by mouth daily before breakfast.      . metoprolol succinate (TOPROL-XL) 50 MG 24 hr tablet Take 50 mg by mouth daily. Take with or immediately following a meal.      . Multiple Vitamin (MULTIVITAMIN WITH MINERALS) TABS tablet Take 1 tablet by mouth daily.      . nitroGLYCERIN (NITROSTAT) 0.4 MG SL tablet Place 0.4 mg under the tongue every 5 (five) minutes x 3 doses as needed. For chest pain        Assessment: 63 y/o M with known h/o CAD presents with radiating CP. He has h/o CABG 2011. Initial  enzymes negative. Dr. Percival Spanish noted some T-wave inversions on his EKG which was new. Plan cath. Baseline Hgb 14.6, plts 165, CrCl 67 (Scr 1.49), Hgb A1c 6.6, TSH 44.14 on 09/20/13.  Goal of Therapy:  Heparin level 0.3-0.7 units/ml Monitor platelets by anticoagulation protocol: Yes   Plan:  Recheck TSH Heparin 4000 unit IV bolus Heparin infusion at 1350 units/hr Check heparin level in 6-8 hrs. Daily heparin level and CBC  Josphine Laffey S. Alford Highland, PharmD, BCPS Clinical Staff Pharmacist Pager [phone]  Eilene Ghazi Stillinger 10/02/2013,6:54 PM

## 2013-10-02 NOTE — ED Notes (Signed)
Dr. Yao at the bedside. 

## 2013-10-02 NOTE — Interval H&P Note (Signed)
Cath Lab Visit (complete for each Cath Lab visit)  Clinical Evaluation Leading to the Procedure:   ACS: Yes.    Non-ACS:    Anginal Classification: CCS IV  Anti-ischemic medical therapy: Minimal Therapy (1 class of medications)  Non-Invasive Test Results: No non-invasive testing performed  Prior CABG: Previous CABG      History and Physical Interval Note:  10/02/2013 9:49 PM  Devon Brady  has presented today for surgery, with the diagnosis of cp  The various methods of treatment have been discussed with the patient and family. After consideration of risks, benefits and other options for treatment, the patient has consented to  Procedure(s): LEFT HEART CATHETERIZATION WITH CORONARY/GRAFT ANGIOGRAM (N/A) as a surgical intervention .  The patient's history has been reviewed, patient examined, no change in status, stable for surgery.  I have reviewed the patient's chart and labs.  Questions were answered to the patient's satisfaction.     Sinclair Grooms

## 2013-10-02 NOTE — Progress Notes (Signed)
CRITICAL VALUE ALERT  Critical value received: Troponin=1.10  Date of notification:  10/02/13  Time of notification:  20:27  Critical value read back:Yes.    Nurse who received alert:  Vita Erm RN  MD notified (1st page):  Dr. Jules Husbands  Time of first page:  20:28  MD notified (2nd page):  Time of second page:  Responding MD:  Dr. Jules Husbands  Time MD responded:  [phone]

## 2013-10-03 ENCOUNTER — Encounter (HOSPITAL_COMMUNITY)
Admission: EM | Disposition: A | Discharge status: Home / Self Care | Payer: Self-pay | Source: Home / Self Care | Attending: Cardiology

## 2013-10-03 DIAGNOSIS — I214 Non-ST elevation (NSTEMI) myocardial infarction: Secondary | ICD-10-CM

## 2013-10-03 DIAGNOSIS — I2510 Atherosclerotic heart disease of native coronary artery without angina pectoris: Secondary | ICD-10-CM

## 2013-10-03 HISTORY — PX: LEFT HEART CATHETERIZATION WITH CORONARY/GRAFT ANGIOGRAM: SHX5450

## 2013-10-03 LAB — LIPID PANEL
CHOLESTEROL: 227 mg/dL — AB (ref 0–200)
HDL: 47 mg/dL (ref >39)
LDL Cholesterol: 167 mg/dL — ABNORMAL HIGH (ref 0–99)
TRIGLYCERIDES: 67 mg/dL (ref <150)
Total CHOL/HDL Ratio: 4.8 RATIO
VLDL: 13 mg/dL (ref 0–40)

## 2013-10-03 LAB — HEPARIN LEVEL (UNFRACTIONATED)
Heparin Unfractionated: 0.54 IU/mL (ref 0.30–0.70)
Heparin Unfractionated: 0.52 IU/mL (ref 0.30–0.70)

## 2013-10-03 LAB — CBC
HEMATOCRIT: 42.2 % (ref 39.0–52.0)
Hemoglobin: 14.1 g/dL (ref 13.0–17.0)
MCH: 29.1 pg (ref 26.0–34.0)
MCHC: 33.4 g/dL (ref 30.0–36.0)
MCV: 87.2 fL (ref 78.0–100.0)
PLATELETS: 163 10*3/uL (ref 150–400)
RBC: 4.84 MIL/uL (ref 4.22–5.81)
RDW: 14.6 % (ref 11.5–15.5)
WBC: 8.6 10*3/uL (ref 4.0–10.5)

## 2013-10-03 LAB — HEMOGLOBIN A1C
Hgb A1c MFr Bld: 6.5 % — ABNORMAL HIGH (ref <5.7)
MEAN PLASMA GLUCOSE: 140 mg/dL — AB (ref <117)

## 2013-10-03 LAB — TROPONIN I
Troponin I: 4.61 ng/mL (ref <0.30)
Troponin I: 9.26 ng/mL (ref <0.30)

## 2013-10-03 LAB — BASIC METABOLIC PANEL
Anion gap: 11 (ref 5–15)
BUN: 18 mg/dL (ref 6–23)
CHLORIDE: 107 meq/L (ref 96–112)
CO2: 24 mEq/L (ref 19–32)
Calcium: 9 mg/dL (ref 8.4–10.5)
Creatinine, Ser: 1.41 mg/dL — ABNORMAL HIGH (ref 0.50–1.35)
GFR calc Af Amer: 61 mL/min — ABNORMAL LOW (ref >90)
GFR calc non Af Amer: 52 mL/min — ABNORMAL LOW (ref >90)
Glucose, Bld: 120 mg/dL — ABNORMAL HIGH (ref 70–99)
POTASSIUM: 4.2 meq/L (ref 3.7–5.3)
Sodium: 142 mEq/L (ref 137–147)

## 2013-10-03 LAB — T4, FREE: FREE T4: 2.14 ng/dL — AB (ref 0.80–1.80)

## 2013-10-03 SURGERY — LEFT HEART CATHETERIZATION WITH CORONARY/GRAFT ANGIOGRAM
Anesthesia: LOCAL

## 2013-10-03 MED ORDER — ENOXAPARIN SODIUM 120 MG/0.8ML ~~LOC~~ SOLN
1.0000 mg/kg | Freq: Two times a day (BID) | SUBCUTANEOUS | Status: DC
  Administered 2013-10-03 – 2013-10-04 (×2): 120 mg via SUBCUTANEOUS
  Filled 2013-10-03 (×3): qty 0.8

## 2013-10-03 MED ORDER — LIDOCAINE HCL (PF) 1 % IJ SOLN
INTRAMUSCULAR | Status: AC
  Filled 2013-10-03: qty 30

## 2013-10-03 MED ORDER — ACETAMINOPHEN 325 MG PO TABS: 650.0000 mg | ORAL_TABLET | ORAL | Status: DC | PRN

## 2013-10-03 MED ORDER — VERAPAMIL HCL 2.5 MG/ML IV SOLN
INTRAVENOUS | Status: AC
  Filled 2013-10-03: qty 2

## 2013-10-03 MED ORDER — ONDANSETRON HCL 4 MG/2ML IJ SOLN: 4.0000 mg | Freq: Four times a day (QID) | INTRAMUSCULAR | Status: DC | PRN

## 2013-10-03 MED ORDER — ISOSORBIDE MONONITRATE ER 60 MG PO TB24
60.0000 mg | ORAL_TABLET | Freq: Every day | ORAL | Status: DC
  Administered 2013-10-03 – 2013-10-04 (×2): 60 mg via ORAL
  Filled 2013-10-03 (×2): qty 1

## 2013-10-03 MED ORDER — NITROGLYCERIN 1 MG/10 ML FOR IR/CATH LAB
INTRA_ARTERIAL | Status: AC
  Filled 2013-10-03: qty 10

## 2013-10-03 MED ORDER — MIDAZOLAM HCL 2 MG/2ML IJ SOLN
INTRAMUSCULAR | Status: AC
  Filled 2013-10-03: qty 2

## 2013-10-03 MED ORDER — FENTANYL CITRATE 0.05 MG/ML IJ SOLN
INTRAMUSCULAR | Status: AC
  Filled 2013-10-03: qty 2

## 2013-10-03 MED ORDER — SODIUM CHLORIDE 0.9 % IV SOLN: INTRAVENOUS | Status: DC

## 2013-10-03 MED ORDER — OXYCODONE-ACETAMINOPHEN 5-325 MG PO TABS
1.0000 | ORAL_TABLET | ORAL | Status: DC | PRN
  Administered 2013-10-03 – 2013-10-04 (×2): 2 via ORAL
  Administered 2013-10-04: 1 via ORAL
  Filled 2013-10-03 (×3): qty 1
  Filled 2013-10-03: qty 2

## 2013-10-03 MED ORDER — HEPARIN (PORCINE) IN NACL 2-0.9 UNIT/ML-% IJ SOLN
INTRAMUSCULAR | Status: AC
  Filled 2013-10-03: qty 1000

## 2013-10-03 NOTE — Progress Notes (Signed)
Subjective: Pt is having some chest discomfort that feels more like a burning indigesting.  This discomfort is significantly improved from yesterday.  Pt has had a headache since getting nitro in the ambulance.  Denies radiation of pain down his arm, nausea, vomiting, diaphoresis, SOB.   Objective: Vital signs in last 24 hours: Temp:  [98 F (36.7 C)-98.7 F (37.1 C)] 98.7 F (37.1 C) (07/22 0005) Pulse Rate:  [72-85] 85 (07/22 0127) Resp:  [16-24] 18 (07/22 0005) BP: (101-120)/(61-77) 120/77 mmHg (07/22 0005) SpO2:  [94 %-100 %] 100 % (07/22 0127) Weight:  [260 lb (117.935 kg)-261 lb 3.9 oz (118.5 kg)] 261 lb 3.9 oz (118.5 kg) (07/22 0005) Weight change:    Intake/Output from previous day: 07/21 0701 - 07/22 0700 In: 1180.6 [I.V.:1180.6] Out: -  Intake/Output this shift: Total I/O In: 1180.6 [I.V.:1180.6] Out: -   PE:  Physical Exam  Constitutional: He is oriented to person, place, and time. He appears well-developed and well-nourished. No distress.  HENT:  Head: Normocephalic and atraumatic.  Nose: Nose normal.  Mouth/Throat: Oropharynx is clear and moist and mucous membranes are normal.  Neck: No hepatojugular reflux and no JVD present. Carotid bruit is not present.  Cardiovascular: Normal rate, regular rhythm, S1 normal, S2 normal, normal heart sounds and intact distal pulses.  Exam reveals no gallop and no friction rub.   No murmur heard. Pulmonary/Chest: Effort normal and breath sounds normal.  Abdominal: Soft. Normal appearance and bowel sounds are normal. There is no tenderness.  Lymphadenopathy:    He has no cervical adenopathy.  Neurological: He is alert and oriented to person, place, and time. He has normal strength.  Skin: Skin is warm, dry and intact. No rash noted.  Psychiatric: He has a normal mood and affect. His speech is normal and behavior is normal.    Lab Results:  Recent Labs  10/02/13 1535 10/03/13 0100  WBC 6.8 8.6  HGB 14.6 14.1  HCT  44.2 42.2  PLT 165 163   BMET  Recent Labs  10/02/13 1535 10/03/13 0100  NA 143 142  K 4.4 4.2  CL 103 107  CO2 28 24  GLUCOSE 110* 120*  BUN 19 18  CREATININE 1.49* 1.41*  CALCIUM 9.4 9.0    Recent Labs  10/02/13 1945 10/03/13 0100  TROPONINI 1.10* 4.61*    Lab Results  Component Value Date   CHOL 227* 10/03/2013   HDL 47 10/03/2013   LDLCALC 167* 10/03/2013   TRIG 67 10/03/2013   CHOLHDL 4.8 10/03/2013   Lab Results  Component Value Date   HGBA1C 6.5* 10/02/2013     Lab Results  Component Value Date   TSH 0.155* 10/02/2013    Hepatic Function Panel  Recent Labs  10/02/13 1535  PROT 7.1  ALBUMIN 3.7  AST 16  ALT 11  ALKPHOS 53  BILITOT 0.4    Recent Labs  10/03/13 0100  CHOL 227*    Studies/Results: Dg Chest 2 View  10/02/2013   EXAM: CHEST  2 VIEW  COMPARISON:  None.  FINDINGS: The heart size and mediastinal contours are within normal limits. Both lungs are clear. The visualized skeletal structures are unremarkable.  IMPRESSION: No active cardiopulmonary disease.   Electronically Signed   By: David  Martinique   On: 10/02/2013 16:03   Mr Brain W Wo Contrast  10/01/2013   CLINICAL DATA:  Two-month history of smelling coffee brewing. History of Hodgkin's lymphoma and thyroid cancer.  EXAM: MRI HEAD WITHOUT  AND WITH CONTRAST  TECHNIQUE: Multiplanar, multiecho pulse sequences of the brain and surrounding structures were obtained without and with intravenous contrast.  CONTRAST:  58mL MULTIHANCE GADOBENATE DIMEGLUMINE 529 MG/ML IV SOLN  COMPARISON:  None.  FINDINGS: The CSF containing spaces are within normal limits for patient age. No focal parenchymal signal abnormality is identified. No mass lesion, midline shift, or extra-axial fluid collection. Ventricles are normal in size without evidence of hydrocephalus.  No diffusion-weighted signal abnormality is identified to suggest acute intracranial infarct. Gray-white matter differentiation is maintained. Normal  flow voids are seen within the intracranial vasculature. No intracranial hemorrhage identified.  The cervicomedullary junction is normal. Pituitary gland is within normal limits. Pituitary stalk is midline. The globes and optic nerves demonstrate a normal appearance with normal signal intensity.  Olfactory bulbs are normal in appearance. No mass lesion or abnormal enhancement seen within this region. Cribriform plate grossly normal.  No abnormal enhancement seen within the brain.  The bone marrow signal intensity is normal. Calvarium is intact. Visualized upper cervical spine is within normal limits.  Scalp soft tissues are unremarkable.  Mild scattered mucoperiosteal thickening noted within the ethmoidal air cells bilaterally. Paranasal sinuses are otherwise clear. Minimal T2 hyperintensity seen within the right mastoid air cells.  IMPRESSION: Normal brain MRI with no findings to explain olfactory disturbance identified.   Electronically Signed   By: Jeannine Boga M.D.   On: 10/01/2013 21:55    Medications: I have reviewed the patient's current medications. Scheduled Meds: . aspirin  324 mg Oral NOW   Or  . aspirin  300 mg Rectal NOW  . aspirin EC  81 mg Oral Daily  . levothyroxine  224 mcg Oral QAC breakfast  . metoprolol succinate  50 mg Oral Daily  . multivitamin with minerals  1 tablet Oral Daily  . nitroGLYCERIN  1 inch Topical 4 times per day  . sodium chloride  3 mL Intravenous Q12H   Continuous Infusions: . sodium chloride    . sodium chloride 1 mL/kg/hr (10/03/13 0600)  . heparin 1,350 Units/hr (10/03/13 0600)   PRN Meds:.sodium chloride, acetaminophen, nitroGLYCERIN, ondansetron (ZOFRAN) IV, sodium chloride, zolpidem  Assessment/Plan: 63 yo male with PMH significant for (CAD CABG in 2011, Last echo had EF of 65% 2013),  HLD, HTN, DM II, hodgkin's lymphoma, erectile dysfunction, hypothyroidism (post-surgical), presented yesterday with chest pain consistent with unstable  angina. EKG with new t-wave inversions, enzymes negative.  NSTEMI  Troponin 4.61-cath today  CHEST PAIN: Pain yesterday was consistent with unstable angina. There are some T-wave inversions. We heparinized. - Cardiac catheterization today - Pt has signed consent and understands the risks  DYSLIPIDEMIA: Total cholesterol 227, LDL 167 - Lipitor 40 mg daily.   HTN: New diagnosis.  BPs normal overnight. - continue home meds  CKD: Cr stable from last night 1.49 - 1.41 This is a followed closely with minimizing dye and hydration.  hydrate well prior to the procedure.   THYROID: TSH low at 0.155 - consider decreasing levothyroxine   LOS: 1 day   Time spent with pt. :15 minutes. [facility]  Nurse Practitioner Certified Pager [phone] or after 5pm and on weekends call [phone] 10/03/2013, 6:43 AM

## 2013-10-03 NOTE — Progress Notes (Signed)
Chaplain responded to spiritual care consult, offering emotional and spiritual support patient, his wife, and his daughter. All in cheerful moods, positive about procedure and recovery. Patient expressed good family and church support. Chaplain assisted with advance directive notarization. Reviewed completed documents and confirmed that patient understood documents and his decisions. Was present with notary and witnesses during notarization. Provided emotional support and pastoral presence to patient and his family. They appreciated support.   Ethelene Browns [phone]

## 2013-10-03 NOTE — Progress Notes (Addendum)
ANTICOAGULATION CONSULT NOTE - Follow Up Consult  Pharmacy Consult for heparin Indication: USAP  Labs:  Recent Labs  10/02/13 1535 10/02/13 1945 10/03/13 0100  HGB 14.6  --  14.1  HCT 44.2  --  42.2  PLT 165  --  163  LABPROT  --  12.9  --   INR  --  0.97  --   HEPARINUNFRC  --   --  0.54  CREATININE 1.49*  --   --   TROPONINI  --  1.10*  --     Assessment/Plan:  63yo male therapeutic on heparin with initial dosing for CP. Will continue gtt at current rate and confirm stable with next troponin.   Wynona Neat, PharmD, BCPS  10/03/2013,2:04 AM  Addendum: Confirmatory heparin level (0.52) remains therapeutic this morning. Plan for cath today.  Plan: We will f/u after cath.

## 2013-10-03 NOTE — CV Procedure (Signed)
Left Heart Catheterization with Coronary Angiography  Report  Devon Brady  63 y.o.  male 1950/07/12  Procedure Date: 10/03/2013 Referring Physician: Percival Spanish. MD Primary Cardiologist: Angelena Form, MD  INDICATIONS: Non-ST elevation MI  PROCEDURE: 1. Left heart catheterization; 2. Coronary angiography; 3. Bypass graft angiography; 4. Left ventriculography; 5. Internal mammary angiography  CONSENT:  The risks, benefits, and details of the procedure were explained in detail to the patient. Risks including death, stroke, heart attack, kidney injury, allergy, limb ischemia, bleeding and radiation injury were discussed.  The patient verbalized understanding and wanted to proceed.  Informed written consent was obtained.  PROCEDURE TECHNIQUE:  After Xylocaine anesthesia a 5 French  sheath was placed in the last radial artery with an angiocath and the modified Seldinger technique.  Coronary angiography was done using a 5 F JR 4 and JL 3.5 catheter.  Left ventriculography was done using the JR 4 catheter and hand injection.   Digital images were reviewed. It was felt that the patient had a completed infarct due to occlusion of the sequential vein graft to the obtuse marginals. Thrombus was noted in the mid graft.   CONTRAST:  Total of 130 cc.  COMPLICATIONS:  None   HEMODYNAMICS:  Aortic pressure 113/75 mmHg; LV pressure  123 over 6 mmHg; LVEDP 16 mm mercury  ANGIOGRAPHIC DATA:   The left main coronary artery is widely patent with smooth distal 30% narrowing.  The left anterior descending artery is totally occluded proximal..  The left circumflex artery is widely patent with 50% mid vessel narrowing. The first and second marginal branches are patent. The second and third marginal branches are totally occluded. The left circumflex PDA is widely patent. Retrograde filling of the sequential graft to the fourth and third marginal branch is noted.  The right coronary artery is patent but  there is severe disease in the acute marginal branch which is a relatively large vessel. It is severely and diffusely involved with a proximal 80% stenosis followed by a proximal 80% stenosis in the mid to distal eccentric 80% stenosis. The continuation of the right coronary beyond the acute marginal branch is 99% obstructed.  BYPASS GRAFT ANGIOGRAPHY: SVG to the ramus intermedius branch) first obtuse marginal) is widely patent. SVG sequential to the obtuse marginal branches is totally occluded with thrombus present beyond the side to side anastomosis with a small first branch. SVG to acute marginal of the right coronary is totally occluded at the aorto ostial.   LIMA to the LAD is widely patent  LEFT VENTRICULOGRAM:  Left ventricular angiogram was done in the 30 RAO projection and revealed inferior hypokinesis. Overall ejection fraction is normal and estimated to be 50%.   IMPRESSIONS:  1. Non-ST elevation myocardial infarction due to thrombotic occlusion of the seat question graft to the obtuse marginals. The patient is pain-free at this time and felt to have suffered a completed a lateral wall infarct. 2. Patent saphenous vein graft to the ramus intermedius. 3. 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 The acute marginal branch is relatively large but diffusely diseased over at least 40 mm. 4. Patent LIMA to the LAD 5. Total occlusion of the third obtuse marginal branch, 99% stenosis of the right coronary beyond the large acute marginal branch, multifocal disease in the acute marginal branch as described above, and total occlusion of the proximal to mid LAD. 6. Inferior hypokinesis. Preserved overall LVEF of 50-55%.  RECOMMENDATION:  Switch from IV heparin to Lovenox which is start later this evening. Add a permanent oral long-acting nitrate Ambulate and if no significant chest discomfort over the next 24 hours, he would be eligible for  discharge Track renal function to exclude acute contrast injury.

## 2013-10-03 NOTE — Interval H&P Note (Signed)
Cath Lab Visit (complete for each Cath Lab visit)  Clinical Evaluation Leading to the Procedure:   ACS: Yes.    Non-ACS:    Anginal Classification: CCS II  Anti-ischemic medical therapy: Minimal Therapy (1 class of medications)  Non-Invasive Test Results: Intermediate-risk stress test findings: cardiac mortality 1-3%/year  Prior CABG: Previous CABG      History and Physical Interval Note:  10/03/2013 8:25 AM  Devon Brady  has presented today for surgery, with the diagnosis of cp  The various methods of treatment have been discussed with the patient and family. After consideration of risks, benefits and other options for treatment, the patient has consented to  Procedure(s): LEFT HEART CATHETERIZATION WITH CORONARY/GRAFT ANGIOGRAM (N/A) as a surgical intervention .  The patient's history has been reviewed, patient examined, no change in status, stable for surgery.  I have reviewed the patient's chart and labs.  Questions were answered to the patient's satisfaction.     Sinclair Grooms

## 2013-10-03 NOTE — Progress Notes (Signed)
Pt awake, c/o feeling like he can't catch his breath, RR 24/min, shallow, 97% on RA.  Placed on 2L per [place], up to 100%.  Pt received Brilinta 3 hrs ago.  Sipping of caffeinated soda as recommended by Brilinta Rep.  Rt groin level 0.  Bedrest complete, assisted to sitting up on side of bed.  Will continue to monitor.

## 2013-10-03 NOTE — Progress Notes (Signed)
Discussed cath and treatment options. Discussed procedure risk including kidney failure.

## 2013-10-03 NOTE — Progress Notes (Signed)
UR complete.  Hiran Leard RN, MSN 

## 2013-10-04 ENCOUNTER — Telehealth: Payer: Self-pay | Admitting: Internal Medicine

## 2013-10-04 DIAGNOSIS — E669 Obesity, unspecified: Secondary | ICD-10-CM | POA: Diagnosis present

## 2013-10-04 DIAGNOSIS — I214 Non-ST elevation (NSTEMI) myocardial infarction: Secondary | ICD-10-CM

## 2013-10-04 LAB — BASIC METABOLIC PANEL
ANION GAP: 9 (ref 5–15)
BUN: 12 mg/dL (ref 6–23)
CO2: 27 meq/L (ref 19–32)
CREATININE: 1.33 mg/dL (ref 0.50–1.35)
Calcium: 8.7 mg/dL (ref 8.4–10.5)
Chloride: 106 mEq/L (ref 96–112)
GFR calc non Af Amer: 56 mL/min — ABNORMAL LOW (ref >90)
GFR, EST AFRICAN AMERICAN: 65 mL/min — AB (ref >90)
Glucose, Bld: 102 mg/dL — ABNORMAL HIGH (ref 70–99)
POTASSIUM: 4.5 meq/L (ref 3.7–5.3)
Sodium: 142 mEq/L (ref 137–147)

## 2013-10-04 LAB — TROPONIN I: TROPONIN I: 6.47 ng/mL — AB (ref <0.30)

## 2013-10-04 MED ORDER — ATORVASTATIN CALCIUM 20 MG PO TABS: 20.0000 mg | ORAL_TABLET | Freq: Every day | ORAL | Status: DC

## 2013-10-04 MED ORDER — ISOSORBIDE MONONITRATE ER 60 MG PO TB24: 60.0000 mg | ORAL_TABLET | Freq: Every day | ORAL | Status: DC

## 2013-10-04 NOTE — Telephone Encounter (Signed)
MRi from July 9 was normal of brain.

## 2013-10-04 NOTE — Telephone Encounter (Signed)
Called pt no answer LMOM with md response.../lmb 

## 2013-10-04 NOTE — Telephone Encounter (Signed)
Pt request result for MRI that was done 10/01/13. Please call pt

## 2013-10-04 NOTE — Progress Notes (Addendum)
Subjective:  No angina with ambulation.  Objective: Vital signs in last 24 hours: Temp:  [97.4 F (36.3 C)-98.7 F (37.1 C)] 97.4 F (36.3 C) (07/23 0745) Pulse Rate:  [72-81] 74 (07/23 0745) Resp:  [18-20] 18 (07/23 0745) BP: (111-147)/(58-110) 111/66 mmHg (07/23 0745) SpO2:  [96 %-100 %] 96 % (07/23 0745) Weight:  [262 lb 5.6 oz (119 kg)] 262 lb 5.6 oz (119 kg) (07/23 0500) Last BM Date: 10/02/13  Intake/Output from previous day: 07/22 0701 - 07/23 0700 In: 2562.5 [I.V.:2562.5] Out: 2550 [Urine:2550] Intake/Output this shift: Total I/O In: 240 [P.O.:240] Out: -   Medications Current Facility-Administered Medications  Medication Dose Route Frequency Provider Last Rate Last Dose  . 0.9 %  sodium chloride infusion   Intravenous Continuous Belva Crome III, MD 125 mL/hr at 10/04/13 0700    . acetaminophen (TYLENOL) tablet 650 mg  650 mg Oral Q4H PRN Cecilie Kicks, NP   650 mg at 10/03/13 1526  . aspirin EC tablet 81 mg  81 mg Oral Daily Cecilie Kicks, NP   81 mg at 10/03/13 0601  . enoxaparin (LOVENOX) injection 120 mg  1 mg/kg Subcutaneous Q12H Belva Crome III, MD   120 mg at 10/03/13 2138  . isosorbide mononitrate (IMDUR) 24 hr tablet 60 mg  60 mg Oral Daily Belva Crome III, MD   60 mg at 10/03/13 1348  . levothyroxine (SYNTHROID, LEVOTHROID) tablet 224 mcg  224 mcg Oral QAC breakfast Cecilie Kicks, NP   224 mcg at 10/04/13 0531  . metoprolol succinate (TOPROL-XL) 24 hr tablet 50 mg  50 mg Oral Daily Cecilie Kicks, NP   50 mg at 10/03/13 1027  . multivitamin with minerals tablet 1 tablet  1 tablet Oral Daily Cecilie Kicks, NP   1 tablet at 10/03/13 1027  . nitroGLYCERIN (NITROSTAT) SL tablet 0.4 mg  0.4 mg Sublingual Q5 Min x 3 PRN Cecilie Kicks, NP   0.4 mg at 10/02/13 1920  . ondansetron (ZOFRAN) injection 4 mg  4 mg Intravenous Q6H PRN Cecilie Kicks, NP   4 mg at 10/03/13 1814  . oxyCODONE-acetaminophen (PERCOCET/ROXICET) 5-325 MG per tablet 1-2 tablet  1-2 tablet Oral  Q4H PRN Sinclair Grooms, MD   2 tablet at 10/04/13 0531  . zolpidem (AMBIEN) tablet 5 mg  5 mg Oral QHS PRN,MR X 1 Cecilie Kicks, NP        PE: General appearance: alert, cooperative and no distress Lungs: clear to auscultation bilaterally Heart: regular rate and rhythm and S3 Extremities: No LEE Pulses: 2+ Skin: Warm and dry Neurologic:Grossly normal  Lab Results:   Recent Labs  10/02/13 1535 10/03/13 0100  WBC 6.8 8.6  HGB 14.6 14.1  HCT 44.2 42.2  PLT 165 163   BMET  Recent Labs  10/02/13 1535 10/03/13 0100  NA 143 142  K 4.4 4.2  CL 103 107  CO2 28 24  GLUCOSE 110* 120*  BUN 19 18  CREATININE 1.49* 1.41*  CALCIUM 9.4 9.0   PT/INR  Recent Labs  10/02/13 1945  LABPROT 12.9  INR 0.97   Cholesterol  Recent Labs  10/03/13 0100  CHOL 227*   Lipid Panel     Component Value Date/Time   CHOL 227* 10/03/2013 0100   TRIG 67 10/03/2013 0100   HDL 47 10/03/2013 0100   CHOLHDL 4.8 10/03/2013 0100   VLDL 13 10/03/2013 0100   LDLCALC 167* 10/03/2013 0100  Assessment/Plan   Principal Problem:   NSTEMI (non-ST elevated myocardial infarction) Active Problems:   Unstable angina   Angina at rest  Plan:   SP left heart cath.  Results:  "1. Non-ST elevation myocardial infarction due to thrombotic occlusion of the seat question graft to the obtuse marginals. The patient is pain-free at this time and felt to have suffered a completed a lateral wall infarct.  2. Patent saphenous vein graft to the ramus intermedius.  3. 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 The acute marginal branch is relatively large but diffusely diseased over at least 40 mm.  4. Patent LIMA to the LAD  5. Total occlusion of the third obtuse marginal branch, 99% stenosis of the right coronary beyond the large acute marginal branch, multifocal disease in the acute marginal branch as described above, and total occlusion of the  proximal to mid LAD.  6. Inferior hypokinesis. Preserved overall LVEF of 50-55%."  Medical therapy.  ASA, imdur 60, toprol 50.  BP and HR stable.  LDL 167.  Try lipitor.  BMET pending.   Rechecking troponin this morning as it has not decreased yet.   Will keep out of work for two weeks,  DC home today   LOS: 2 days    HAGER, BRYAN PA-C 10/04/2013 8:56 AM  Patient seen, examined. Available data reviewed. Agree with findings, assessment, and plan as outlined by Tarri Fuller, PA-C. Exam reveals alert, oriented male in NAD. Lungs clear, heart RRR without murmur, radial site clear. No symptoms with ambulation this am. Plan discharge home this am as long as troponin is flat or down-trending. Has hx statin-intolerance to Crestor but agreeable to try atorvastatin 20 mg.  Sherren Mocha, M.D. 10/04/2013 9:21 AM

## 2013-10-04 NOTE — Progress Notes (Signed)
CARDIAC REHAB PHASE I   PRE:  Rate/Rhythm: 72 SR  BP:  Supine: 123/75  Sitting:   Standing:    SaO2:   MODE:  Ambulation: 700 ft   POST:  Rate/Rhythm: 86 SR  BP:  Supine:   Sitting: 124/73  Standing:    SaO2:  0840-0950 Pt walked 700 ft with steady gait. No CP.tolerated well. MI ed completed with pt and wife who voiced understanding. Pt had HGBA1C of 6.5 so discussed watching carbs and making sure he follows up with primary MD. Discussed CRP 2. Pt has attended before and gave permission for referral to [facility].    Graylon Good, RN BSN  10/04/2013 9:49 AM

## 2013-10-04 NOTE — Discharge Summary (Signed)
Physician Discharge Summary      Patient ID: Devon Brady MRN: [id number] DOB/AGE: 09-28-1950 63 y.o.  Admit date: 10/02/2013 Discharge date: 10/04/2013  Admission Diagnoses:  NSTEMI  Discharge Diagnoses:  Principal Problem:   NSTEMI (non-ST elevated myocardial infarction) Active Problems:   Unstable angina   Angina at rest   Obesity, Class II, BMI 35-39.9, with comorbidity   Discharged Condition: stable  Hospital Course:   The patient has a history of CABG in 2011. Last echo had EF of 65% 2013. He developed chest pain today. This was substernal. 7/10. Radiating down his left arm. Happened at rest. Is nauseated with it. He did have some slight diaphoresis. Is not short of breath. He's not had pain like this before. It was not like previous reflux. He called EMS. They gave him 4 aspirin. He subsequently had 4 sublingual nitroglycerin glycerin as sprays and eventually had resolution of his discomfort. She did have some T-wave inversions on his EKG which was new compared with previous. His initial enzymes were negative.  Of note interestingly he had an MRI yesterday because his been having a strong sense of smell and things that aren't there such as a smell of something brewing coffee when this hasn't actually happened.  The patient was admitted and ruled in for NSTEMI with peak troponin of 9.26.  He was started on heparin, NTG paste and ASA.  He went for left heart cath which revealed:  1. Non-ST elevation myocardial infarction due to thrombotic occlusion of the seat question graft to the obtuse marginals. The patient is pain-free at this time and felt to have suffered a completed a lateral wall infarct.  2. Patent saphenous vein graft to the ramus intermedius.  3. 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 The acute marginal branch is relatively large but diffusely diseased over at least 40 mm.  4. Patent LIMA to the LAD  5.  Total occlusion of the third obtuse marginal branch, 99% stenosis of the right coronary beyond the large acute marginal branch, multifocal disease in the acute marginal branch as described above, and total occlusion of the proximal to mid LAD.  6. Inferior hypokinesis. Preserved overall LVEF of 50-55%.   Continue medical therapy. ASA, imdur 60, toprol 50. BP and HR stable. LDL 167.  Adding lipitor 20mg .  He will be referred to a registered dietician help with obesity.  T4 is mildly elevated.  Follow up with PCP.  The patient was seen by Dr. Burt Knack who felt he was stable for DC home.    Consults: Cardiac rehab  Significant Diagnostic Studies:   Left Heart Catheterization with Coronary Angiography Report  AUDRY PECINA  63 y.o.  male  [birth year]  Procedure Date: 10/03/2013  Referring Physician: Percival Spanish. MD  Primary Cardiologist: Angelena Form, MD  INDICATIONS: Non-ST elevation MI  PROCEDURE: 1. Left heart catheterization; 2. Coronary angiography; 3. Bypass graft angiography; 4. Left ventriculography; 5. Internal mammary angiography  CONSENT:  The risks, benefits, and details of the procedure were explained in detail to the patient. Risks including death, stroke, heart attack, kidney injury, allergy, limb ischemia, bleeding and radiation injury were discussed. The patient verbalized understanding and wanted to proceed. Informed written consent was obtained.  PROCEDURE TECHNIQUE: After Xylocaine anesthesia a 5 French sheath was placed in the last radial artery with an angiocath and the modified Seldinger technique. Coronary angiography was done using a 5 F JR 4 and JL 3.5  catheter. Left ventriculography was done using the JR 4 catheter and hand injection.  Digital images were reviewed. It was felt that the patient had a completed infarct due to occlusion of the sequential vein graft to the obtuse marginals. Thrombus was noted in the mid graft.  CONTRAST: Total of 130 cc.  COMPLICATIONS: None    HEMODYNAMICS: Aortic pressure 113/75 mmHg; LV pressure 123 over 6 mmHg; LVEDP 16 mm mercury  ANGIOGRAPHIC DATA: The left main coronary artery is widely patent with smooth distal 30% narrowing.  The left anterior descending artery is totally occluded proximal..  The left circumflex artery is widely patent with 50% mid vessel narrowing. The first and second marginal branches are patent. The second and third marginal branches are totally occluded. The left circumflex PDA is widely patent. Retrograde filling of the sequential graft to the fourth and third marginal branch is noted.  The right coronary artery is patent but there is severe disease in the acute marginal branch which is a relatively large vessel. It is severely and diffusely involved with a proximal 80% stenosis followed by a proximal 80% stenosis in the mid to distal eccentric 80% stenosis. The continuation of the right coronary beyond the acute marginal branch is 99% obstructed.  BYPASS GRAFT ANGIOGRAPHY: SVG to the ramus intermedius branch) first obtuse marginal) is widely patent.  SVG sequential to the obtuse marginal branches is totally occluded with thrombus present beyond the side to side anastomosis with a small first branch.  SVG to acute marginal of the right coronary is totally occluded at the aorto ostial.  LIMA to the LAD is widely patent  LEFT VENTRICULOGRAM: Left ventricular angiogram was done in the 30 RAO projection and revealed inferior hypokinesis. Overall ejection fraction is normal and estimated to be 50%.  IMPRESSIONS: 1. Non-ST elevation myocardial infarction due to thrombotic occlusion of the seat question graft to the obtuse marginals. The patient is pain-free at this time and felt to have suffered a completed a lateral wall infarct.  2. Patent saphenous vein graft to the ramus intermedius.  3. 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 The acute marginal branch is  relatively large but diffusely diseased over at least 40 mm.  4. Patent LIMA to the LAD  5. Total occlusion of the third obtuse marginal branch, 99% stenosis of the right coronary beyond the large acute marginal branch, multifocal disease in the acute marginal branch as described above, and total occlusion of the proximal to mid LAD.  6. Inferior hypokinesis. Preserved overall LVEF of 50-55%.  RECOMMENDATION: Switch from IV heparin to Lovenox which is start later this evening.  Add a permanent oral long-acting nitrate  Ambulate and if no significant chest discomfort over the next 24 hours, he would be eligible for discharge  Track renal function to exclude acute contrast injury.   Lipid Panel     Component Value Date/Time   CHOL 227* 10/03/2013 0100   TRIG 67 10/03/2013 0100   HDL 47 10/03/2013 0100   CHOLHDL 4.8 10/03/2013 0100   VLDL 13 10/03/2013 0100   LDLCALC 167* 10/03/2013 0100   TSH: 0.155 T4:   2.14 A1C:   6.5  Treatments: See above  Discharge Exam: Blood pressure 111/66, pulse 74, temperature 97.4 F (36.3 C), temperature source Oral, resp. rate 18, height 5\' 10"  (1.778 m), weight 262 lb 5.6 oz (119 kg), SpO2 96.00%.   Disposition: 01-Home or Self Care  Discharge Instructions   Amb Referral to Cardiac Rehabilitation    Complete by:  As directed      Diet - low sodium heart healthy    Complete by:  As directed      Discharge instructions    Complete by:  As directed   No lifting with left arm for three days     Increase activity slowly    Complete by:  As directed             Medication List         aspirin EC 81 MG tablet  Take 81 mg by mouth daily.     atorvastatin 20 MG tablet  Commonly known as:  LIPITOR  Take 1 tablet (20 mg total) by mouth daily.     isosorbide mononitrate 60 MG 24 hr tablet  Commonly known as:  IMDUR  Take 1 tablet (60 mg total) by mouth daily.     levothyroxine 112 MCG tablet  Commonly known as:  SYNTHROID, LEVOTHROID    Take 224 mcg by mouth daily before breakfast.     metoprolol succinate 50 MG 24 hr tablet  Commonly known as:  TOPROL-XL  Take 50 mg by mouth daily. Take with or immediately following a meal.     multivitamin with minerals Tabs tablet  Take 1 tablet by mouth daily.     nitroGLYCERIN 0.4 MG SL tablet  Commonly known as:  NITROSTAT  Place 0.4 mg under the tongue every 5 (five) minutes x 3 doses as needed. For chest pain       Follow-up Information   Follow up with Lauree Chandler, MD On 11/08/2013. (3:15 PM)    Specialty:  Cardiology   Contact information:   [place]. [address] [place] [address] [phone]      Greater than 30 minutes was spent completing the patient's discharge.    SignedTarri Fuller, PA-C 10/04/2013, 11:22 AM

## 2013-10-07 ENCOUNTER — Encounter: Payer: Self-pay | Admitting: Internal Medicine

## 2013-10-10 ENCOUNTER — Other Ambulatory Visit: Payer: Self-pay | Admitting: Cardiovascular Disease

## 2013-10-12 ENCOUNTER — Encounter: Payer: Self-pay | Admitting: *Deleted

## 2013-10-12 ENCOUNTER — Telehealth: Payer: Self-pay | Admitting: Cardiovascular Disease

## 2013-10-12 NOTE — Telephone Encounter (Signed)
New problem   This message came through on patient schedule... Dr. Luisa Dago, PA-C with your office put me on medical for two weeks. I will need a doctor's release before I can return to work. Please advise. I would like to return to work on August 6th. You may give me a call or send me an E-mail regarding this matter. Thanks

## 2013-10-12 NOTE — Telephone Encounter (Signed)
Spoke with pt who is asking for return to work letter. He would like to return on October 18, 2013. Was discharged from hospital on October 04, 2013. He reports he is feeling good,getting stronger and started a walking program. 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 Does require driving. I told pt I would send to Dr. Angelena Form for review and if OK to return to work would leave letter at front desk for him to pick up.

## 2013-10-12 NOTE — Telephone Encounter (Signed)
OK to return to work. cdm

## 2013-10-12 NOTE — Telephone Encounter (Signed)
Letter left at front desk. Pt aware.

## 2013-10-31 ENCOUNTER — Other Ambulatory Visit: Payer: Self-pay | Admitting: *Deleted

## 2013-10-31 DIAGNOSIS — C73 Malignant neoplasm of thyroid gland: Secondary | ICD-10-CM

## 2013-11-01 ENCOUNTER — Encounter (HOSPITAL_COMMUNITY): Payer: Self-pay

## 2013-11-01 ENCOUNTER — Other Ambulatory Visit: Payer: No Typology Code available for payment source

## 2013-11-01 ENCOUNTER — Ambulatory Visit (HOSPITAL_COMMUNITY)
Admission: RE | Admit: 2013-11-01 | Discharge: 2013-11-01 | Disposition: A | Discharge status: Home / Self Care | Payer: BC Managed Care – PPO | Source: Ambulatory Visit | Attending: Hematology and Oncology | Admitting: Hematology and Oncology

## 2013-11-01 DIAGNOSIS — C73 Malignant neoplasm of thyroid gland: Secondary | ICD-10-CM

## 2013-11-01 DIAGNOSIS — C8190 Hodgkin lymphoma, unspecified, unspecified site: Secondary | ICD-10-CM

## 2013-11-01 LAB — COMPREHENSIVE METABOLIC PANEL (CC13)
ALK PHOS: 75 U/L (ref 40–150)
ALT: 12 U/L (ref 0–55)
ANION GAP: 10 meq/L (ref 3–11)
AST: 18 U/L (ref 5–34)
Albumin: 3.8 g/dL (ref 3.5–5.0)
BILIRUBIN TOTAL: 0.28 mg/dL (ref 0.20–1.20)
BUN: 20 mg/dL (ref 7.0–26.0)
CO2: 23 mEq/L (ref 22–29)
CREATININE: 1.5 mg/dL — AB (ref 0.7–1.3)
Calcium: 9.9 mg/dL (ref 8.4–10.4)
Chloride: 110 mEq/L — ABNORMAL HIGH (ref 98–109)
Glucose: 104 mg/dl (ref 70–140)
Potassium: 4.4 mEq/L (ref 3.5–5.1)
SODIUM: 144 meq/L (ref 136–145)
TOTAL PROTEIN: 7.2 g/dL (ref 6.4–8.3)

## 2013-11-01 LAB — CBC WITH DIFFERENTIAL/PLATELET
BASO%: 0.8 % (ref 0.0–2.0)
BASOS ABS: 0 10*3/uL (ref 0.0–0.1)
EOS%: 3 % (ref 0.0–7.0)
Eosinophils Absolute: 0.2 10*3/uL (ref 0.0–0.5)
HEMATOCRIT: 45.1 % (ref 38.4–49.9)
HGB: 14.6 g/dL (ref 13.0–17.1)
LYMPH#: 1.8 10*3/uL (ref 0.9–3.3)
LYMPH%: 30.5 % (ref 14.0–49.0)
MCH: 28.6 pg (ref 27.2–33.4)
MCHC: 32.5 g/dL (ref 32.0–36.0)
MCV: 88 fL (ref 79.3–98.0)
MONO#: 0.6 10*3/uL (ref 0.1–0.9)
MONO%: 9.3 % (ref 0.0–14.0)
NEUT#: 3.3 10*3/uL (ref 1.5–6.5)
NEUT%: 56.4 % (ref 39.0–75.0)
PLATELETS: 182 10*3/uL (ref 140–400)
RBC: 5.12 10*6/uL (ref 4.20–5.82)
RDW: 14.9 % — ABNORMAL HIGH (ref 11.0–14.6)
WBC: 5.9 10*3/uL (ref 4.0–10.3)

## 2013-11-01 LAB — LACTATE DEHYDROGENASE (CC13): LDH: 182 U/L (ref 125–245)

## 2013-11-01 MED ORDER — IOHEXOL 300 MG/ML  SOLN
100.0000 mL | Freq: Once | INTRAMUSCULAR | Status: AC | PRN
Start: 2013-11-01 — End: 2013-11-01
  Administered 2013-11-01: 100 mL via INTRAVENOUS

## 2013-11-05 ENCOUNTER — Encounter: Payer: Self-pay | Admitting: Hematology and Oncology

## 2013-11-05 ENCOUNTER — Ambulatory Visit (HOSPITAL_BASED_OUTPATIENT_CLINIC_OR_DEPARTMENT_OTHER): Payer: BC Managed Care – PPO | Admitting: Hematology and Oncology

## 2013-11-05 VITALS — BP 133/87 | HR 85 | Temp 97.7°F | Resp 18 | Ht 70.0 in | Wt 252.5 lb

## 2013-11-05 DIAGNOSIS — C8190 Hodgkin lymphoma, unspecified, unspecified site: Secondary | ICD-10-CM

## 2013-11-05 DIAGNOSIS — C73 Malignant neoplasm of thyroid gland: Secondary | ICD-10-CM

## 2013-11-05 DIAGNOSIS — Z87898 Personal history of other specified conditions: Secondary | ICD-10-CM

## 2013-11-05 DIAGNOSIS — Z85850 Personal history of malignant neoplasm of thyroid: Secondary | ICD-10-CM

## 2013-11-05 DIAGNOSIS — I214 Non-ST elevation (NSTEMI) myocardial infarction: Secondary | ICD-10-CM

## 2013-11-05 DIAGNOSIS — N289 Disorder of kidney and ureter, unspecified: Secondary | ICD-10-CM

## 2013-11-05 NOTE — Progress Notes (Signed)
[facility] OFFICE PROGRESS NOTE  Patient Care Team: Janith Lima, MD as PCP - General (Internal Medicine) Louis Meckel, MD as Consulting Physician (Nephrology) Jacelyn Pi, MD as Consulting Physician (Endocrinology) Izora Gala, MD as Consulting Physician (Otolaryngology) Jeryl Columbia, MD as Consulting Physician (Gastroenterology) Heath Lark, MD as Consulting Physician (Hematology and Oncology)  SUMMARY OF ONCOLOGIC HISTORY: Oncology History   Hodgkin's lymphoma, nodular sclerosing   Primary site: Lymphoid Neoplasms (Bilateral)   Clinical: Stage IV signed by Heath Lark, MD on 05/07/2013  3:56 PM   Pathologic: Stage IV signed by Heath Lark, MD on 05/07/2013  3:56 PM   Summary: Stage IV Primary thyroid papillary carcinoma   Primary site: Thyroid - Papillary or Follicular (45 years and older) (Left)   Staging method: AJCC 7th Edition   Clinical: Stage I (T1, N0, M0) signed by Heath Lark, MD on 05/07/2013  3:48 PM   Pathologic: Stage I (T1, N0, cM0) signed by Heath Lark, MD on 05/07/2013  3:48 PM   Summary: Stage I (T1, N0, cM0)       Hodgkin's lymphoma   05/28/2011 Imaging CT scan of the chest, abdomen and pelvis shows significant and diffuse lymphadenopathy   06/07/2011 Surgery Lymph node biopsy from the right inguinal region came back positive for nodular sclerosing Hodgkin lymphoma   06/11/2011 - 11/12/2011 Chemotherapy The patient received treatment with ABVD for Hodgkin's lymphoma. 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    06/11/2011 Imaging PET CT scan show bilateral cervical, axillary, inguinal, retroperitoneal lymphadenopathy and bone involvement   06/11/2011 Bone Marrow Biopsy Bone marrow biopsy was negative for recurrence   08/05/2011 Imaging Repeat PET scan show marked reduction in the lymphadenopathy   11/25/2011 Imaging PET CT scan show almost near complete resolution of  disease   03/31/2012 Surgery The patient underwent thyroid resection which show papillary thyroid cancer   05/15/2012 Imaging Repeat CT scan show complete resolution of all disease   08/30/2012 - 08/30/2012 Radiation Therapy The patient received radioactive iodine treatment for thyroid cancer   11/17/2012 Imaging Repeat CT scan showed complete resolution of all disease   05/07/2013 Imaging Repeat CT scan show no evidence of disease recurrence    INTERVAL HISTORY: Please see below for problem oriented charting. He feels well recently. He did have a heart attack in July that was medically managed. He denies any recent fever, chills, night sweats or abnormal weight loss He denies new lymphadenopathy.  REVIEW OF SYSTEMS:   Constitutional: Denies fevers, chills or abnormal weight loss Eyes: Denies blurriness of vision Ears, nose, mouth, throat, and face: Denies mucositis or sore throat Respiratory: Denies cough, dyspnea or wheezes Cardiovascular: Denies palpitation, chest discomfort or lower extremity swelling Gastrointestinal:  Denies nausea, heartburn or change in bowel habits Skin: Denies abnormal skin rashes Lymphatics: Denies new lymphadenopathy or easy bruising Neurological:Denies numbness, tingling or new weaknesses Behavioral/Psych: Mood is stable, no new changes  All other systems were reviewed with the patient and are negative.  I have reviewed the past medical history, past surgical history, social history and family history with the patient and they are unchanged from previous note.  ALLERGIES:  is allergic to crestor.  MEDICATIONS:  Current Outpatient Prescriptions  Medication Sig Dispense Refill  . co-enzyme Q-10 30 MG capsule Take 30 mg by mouth daily.      . Lactobacillus (PROBIOTIC ACIDOPHILUS PO) Take by mouth.      Marland Kitchen  Multiple Vitamin (MULTIVITAMIN) tablet Take 1 tablet by mouth daily.      Marland Kitchen aspirin EC 81 MG tablet Take 81 mg by mouth daily.      Marland Kitchen atorvastatin (LIPITOR)  20 MG tablet Take 1 tablet (20 mg total) by mouth daily.  30 tablet  5  . isosorbide mononitrate (IMDUR) 60 MG 24 hr tablet Take 1 tablet (60 mg total) by mouth daily.  30 tablet  5  . levothyroxine (SYNTHROID, LEVOTHROID) 112 MCG tablet Take 224 mcg by mouth daily before breakfast.      . metoprolol succinate (TOPROL-XL) 50 MG 24 hr tablet Take 50 mg by mouth daily. Take with or immediately following a meal.      . Multiple Vitamin (MULTIVITAMIN WITH MINERALS) TABS tablet Take 1 tablet by mouth daily.      . nitroGLYCERIN (NITROSTAT) 0.4 MG SL tablet Place 0.4 mg under the tongue every 5 (five) minutes x 3 doses as needed. For chest pain      . NITROSTAT 0.4 MG SL tablet PLACE 1 TABLET UNDER THE TONGUE EVERY 5 MINUTES AS NEEDED FOR CHEST PAIN  25 tablet  1  . [DISCONTINUED] citalopram (CELEXA) 20 MG tablet Take 1 tablet (20 mg total) by mouth daily.  30 tablet  1  . [DISCONTINUED] omeprazole (PRILOSEC OTC) 20 MG tablet Take 1 tablet (20 mg total) by mouth daily.  28 tablet  1   No current facility-administered medications for this visit.    PHYSICAL EXAMINATION: ECOG PERFORMANCE STATUS: 0 - Asymptomatic  Filed Vitals:   11/05/13 0814  BP: 133/87  Pulse: 85  Temp: 97.7 F (36.5 C)  Resp: 18   Filed Weights   11/05/13 0814  Weight: 252 lb 8 oz (114.533 kg)    GENERAL:alert, no distress and comfortable SKIN: skin color, texture, turgor are normal, no rashes or significant lesions EYES: normal, Conjunctiva are pink and non-injected, sclera clear OROPHARYNX:no exudate, no erythema and lips, buccal mucosa, and tongue normal  NECK: supple, thyroid normal size, non-tender, without nodularity LYMPH:  no palpable lymphadenopathy in the cervical, axillary or inguinal LUNGS: clear to auscultation and percussion with normal breathing effort HEART: regular rate & rhythm and no murmurs and no lower extremity edema ABDOMEN:abdomen soft, non-tender and normal bowel sounds Musculoskeletal:no  cyanosis of digits and no clubbing  NEURO: alert & oriented x 3 with fluent speech, no focal motor/sensory deficits  LABORATORY DATA:  I have reviewed the data as listed    Component Value Date/Time   NA 144 11/01/2013 0839   NA 142 10/04/2013 0800   K 4.4 11/01/2013 0839   K 4.5 10/04/2013 0800   CL 106 10/04/2013 0800   CL 106 05/15/2012 0815   CO2 23 11/01/2013 0839   CO2 27 10/04/2013 0800   GLUCOSE 104 11/01/2013 0839   GLUCOSE 102* 10/04/2013 0800   GLUCOSE 106* 05/15/2012 0815   BUN 20.0 11/01/2013 0839   BUN 12 10/04/2013 0800   CREATININE 1.5* 11/01/2013 0839   CREATININE 1.33 10/04/2013 0800   CALCIUM 9.9 11/01/2013 0839   CALCIUM 8.7 10/04/2013 0800   PROT 7.2 11/01/2013 0839   PROT 7.1 10/02/2013 1535   ALBUMIN 3.8 11/01/2013 0839   ALBUMIN 3.7 10/02/2013 1535   AST 18 11/01/2013 0839   AST 16 10/02/2013 1535   ALT 12 11/01/2013 0839   ALT 11 10/02/2013 1535   ALKPHOS 75 11/01/2013 0839   ALKPHOS 53 10/02/2013 1535   BILITOT 0.28 11/01/2013 0839  BILITOT 0.4 10/02/2013 1535   GFRNONAA 56* 10/04/2013 0800   GFRAA 65* 10/04/2013 0800    No results found for this basename: SPEP, UPEP,  kappa and lambda light chains    Lab Results  Component Value Date   WBC 5.9 11/01/2013   NEUTROABS 3.3 11/01/2013   HGB 14.6 11/01/2013   HCT 45.1 11/01/2013   MCV 88.0 11/01/2013   PLT 182 11/01/2013      Chemistry      Component Value Date/Time   NA 144 11/01/2013 0839   NA 142 10/04/2013 0800   K 4.4 11/01/2013 0839   K 4.5 10/04/2013 0800   CL 106 10/04/2013 0800   CL 106 05/15/2012 0815   CO2 23 11/01/2013 0839   CO2 27 10/04/2013 0800   BUN 20.0 11/01/2013 0839   BUN 12 10/04/2013 0800   CREATININE 1.5* 11/01/2013 0839   CREATININE 1.33 10/04/2013 0800      Component Value Date/Time   CALCIUM 9.9 11/01/2013 0839   CALCIUM 8.7 10/04/2013 0800   ALKPHOS 75 11/01/2013 0839   ALKPHOS 53 10/02/2013 1535   AST 18 11/01/2013 0839   AST 16 10/02/2013 1535   ALT 12 11/01/2013 0839   ALT 11 10/02/2013 1535    BILITOT 0.28 11/01/2013 0839   BILITOT 0.4 10/02/2013 1535       RADIOGRAPHIC STUDIES: CT scan done last week showed no evidence of recurrence  I have personally reviewed the radiological images as listed and agreed with the findings in the report.  ASSESSMENT & PLAN:  Hodgkin's lymphoma He has no signs of disease recurrence. Clinical exam, blood work and imaging study were normal. I recommend port removal. I will change his followup visits to once a year with history, physical examination and blood work.  Primary thyroid papillary carcinoma He has no signs of disease recurrence.  Renal insufficiency I recommend increase oral fluid intake. His serum creatinine was mildly elevated recently.  NSTEMI (non-ST elevated myocardial infarction) The patient will continue medical management under the care of his cardiologist.   Orders Placed This Encounter  Procedures  . IR Removal Tun Access W/ Port W/O FL    Standing Status: Future     Number of Occurrences:      Standing Expiration Date: 01/06/2015    Order Specific Question:  Reason for exam:    Answer:  remove port    Order Specific Question:  Preferred Imaging Location?    Answer:  [facility]  . Comprehensive metabolic panel    Standing Status: Future     Number of Occurrences:      Standing Expiration Date: 12/10/2014  . CBC with Differential    Standing Status: Future     Number of Occurrences:      Standing Expiration Date: 12/10/2014  . Lactate dehydrogenase    Standing Status: Future     Number of Occurrences:      Standing Expiration Date: 12/10/2014   All questions were answered. The patient knows to call the clinic with any problems, questions or concerns. No barriers to learning was detected. I spent 25 minutes counseling the patient face to face. The total time spent in the appointment was 30 minutes and more than 50% was on counseling and review of test results     [facility], [place], MD 11/05/2013 9:14  AM

## 2013-11-05 NOTE — Assessment & Plan Note (Signed)
I recommend increase oral fluid intake. His serum creatinine was mildly elevated recently.

## 2013-11-05 NOTE — Assessment & Plan Note (Signed)
The patient will continue medical management under the care of his cardiologist.

## 2013-11-05 NOTE — Assessment & Plan Note (Signed)
He has no signs of disease recurrence.

## 2013-11-05 NOTE — Assessment & Plan Note (Signed)
He has no signs of disease recurrence. Clinical exam, blood work and imaging study were normal. I recommend port removal. I will change his followup visits to once a year with history, physical examination and blood work.

## 2013-11-06 ENCOUNTER — Telehealth: Payer: Self-pay | Admitting: Hematology and Oncology

## 2013-11-06 ENCOUNTER — Other Ambulatory Visit: Payer: Self-pay | Admitting: Hematology and Oncology

## 2013-11-06 DIAGNOSIS — C8190 Hodgkin lymphoma, unspecified, unspecified site: Secondary | ICD-10-CM

## 2013-11-06 NOTE — Telephone Encounter (Signed)
S.w. Dr. Marlowe Aschoff office they will contact pt with d.t. For port removal.

## 2013-11-08 ENCOUNTER — Ambulatory Visit (INDEPENDENT_AMBULATORY_CARE_PROVIDER_SITE_OTHER): Payer: BC Managed Care – PPO | Admitting: Cardiovascular Disease

## 2013-11-08 ENCOUNTER — Encounter: Payer: Self-pay | Admitting: Cardiovascular Disease

## 2013-11-08 ENCOUNTER — Ambulatory Visit: Payer: BC Managed Care – PPO | Admitting: Cardiovascular Disease

## 2013-11-08 VITALS — BP 110/70 | HR 90 | Ht 70.0 in | Wt 252.0 lb

## 2013-11-08 DIAGNOSIS — E785 Hyperlipidemia, unspecified: Secondary | ICD-10-CM

## 2013-11-08 DIAGNOSIS — I10 Essential (primary) hypertension: Secondary | ICD-10-CM

## 2013-11-08 DIAGNOSIS — I2510 Atherosclerotic heart disease of native coronary artery without angina pectoris: Secondary | ICD-10-CM

## 2013-11-08 NOTE — Patient Instructions (Signed)
Your physician wants you to follow-up in:  6 months. You will receive a reminder letter in the mail two months in advance. If you don't receive a letter, please call our office to schedule the follow-up appointment.  Your physician recommends that you return for fasting lab work in:  About 8 weeks.--Lipid and Liver profiles. This is the week of December 31, 2013. The lab opens at 7:30 AM every week day.

## 2013-11-08 NOTE — Progress Notes (Signed)
History of Present Illness: 63 yo AAM with history of CAD s/p CABG August 2011, HTN, hyperlipidemia, stage IV Hodgkins lymphoma who is here today for cardiac follow up. Cardiac cath in August 2011 with severe triple vessel disease. He underwent 5V CABG August 2011 per Dr. Roxy Manns with LIMA to LAD, SVG to Ramus, SVG sequential to OM1/OM2, SVG to RV marginal. He was discovered to have thyroid cancer in 2014 and he underwent a thyroidectomy. Admitted to [facility] 10/02/13 with chest pain and ruled in for MI with elevated troponin. Cardiac cath per Dr. Tamala Julian on 10/03/13 with patent IMA graft to LAD, patent SVG to Ramus, occluded SVG to OM1/OM2, SVG to PDA. There were no focal targets for PCI. Imdur was added.   He is here today for follow up. He tells me that he has been doing well. No chest pains or SOB. No palpitations.   Primary Care Physician: Dr. Etter Sjogren  Last Lipid Profile:Lipid Panel     Component Value Date/Time   CHOL 227* 10/03/2013 0100   TRIG 67 10/03/2013 0100   HDL 47 10/03/2013 0100   CHOLHDL 4.8 10/03/2013 0100   VLDL 13 10/03/2013 0100   LDLCALC 167* 10/03/2013 0100     Past Medical History  Diagnosis Date  . Hypertension   . Hyperlipidemia   . Anemia   . Hydronephrosis   . Renal insufficiency   . Hemorrhoid   . Sleep apnea     No CPAP  . Hodgkin's lymphoma   . 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 H/O colonoscopy 2013  . Complication of anesthesia     slow to wake up  . CAD (coronary artery disease)   . TIA (transient ischemic attack) 2011  . H/O hiatal hernia   . History of colon polyps   . Nocturia   . 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 Myocardial infarction 2011    "mild one, before the CABG"  . Hypothyroidism   . GERD (gastroesophageal reflux disease)     Past Surgical History  Procedure Laterality Date  . Vasectomy  1980  . Bunionectomy Right 1978    foot  . Cardiac catheterization  10/15/2009  . Portacath  placement  06/10/2011    Procedure: INSERTION PORT-A-CATH;  Surgeon: [name], MD;  Location: [facility];  Service: General;  Laterality: Left;  subclavian   . Lymph node biopsy  06/07/11    RIGHT INGUINAL NODE: CLASSICAL HODGKIN'S LYMPHOMA, NODULAR SCLEROSIS TYPE  . Bone marrow aspirate,biopsy, and clot  06/11/11    LEFT ILIAC CREAST  . Tonsillectomy      as a child  . Coronary artery bypass graft  10/17/2009    LIMA to LAD,SVG to Ramus,SVG to OM sequential to OM2, SVG to marginal of RCA  . Colonoscopy    . Esophagogastroduodenoscopy    . Thyroidectomy  03/31/2012    Procedure: THYROIDECTOMY;  Surgeon: Izora Gala, MD;  Location: [facility] OR;  Service: ENT;  Laterality: N/A;    Current Outpatient Prescriptions  Medication Sig Dispense Refill  . aspirin EC 81 MG tablet Take 81 mg by mouth daily.      Marland Kitchen atorvastatin (LIPITOR) 20 MG tablet Take 1 tablet (20 mg total) by mouth daily.  30 tablet  5  . co-enzyme Q-10 30 MG capsule Take 30 mg by mouth daily.      . isosorbide mononitrate (IMDUR) 60 MG 24 hr tablet Take  1 tablet (60 mg total) by mouth daily.  30 tablet  5  . Lactobacillus (PROBIOTIC ACIDOPHILUS PO) Take by mouth.      . levothyroxine (SYNTHROID, LEVOTHROID) 112 MCG tablet Take 224 mcg by mouth daily before breakfast.      . metoprolol succinate (TOPROL-XL) 50 MG 24 hr tablet Take 50 mg by mouth daily. Take with or immediately following a meal.      . Multiple Vitamin (MULTIVITAMIN WITH MINERALS) TABS tablet Take 1 tablet by mouth daily.      . Multiple Vitamin (MULTIVITAMIN) tablet Take 1 tablet by mouth daily.      Marland Kitchen NITROSTAT 0.4 MG SL tablet PLACE 1 TABLET UNDER THE TONGUE EVERY 5 MINUTES AS NEEDED FOR CHEST PAIN  25 tablet  1  . [DISCONTINUED] citalopram (CELEXA) 20 MG tablet Take 1 tablet (20 mg total) by mouth daily.  30 tablet  1  . [DISCONTINUED] omeprazole (PRILOSEC OTC) 20 MG tablet Take 1 tablet (20 mg total) by mouth daily.  28 tablet  1   No current facility-administered  medications for this visit.    Allergies  Allergen Reactions  . Crestor [Rosuvastatin]     Muscle aches    History   Social History  . Marital Status: Married    Spouse Name: N/A    Number of Children: 2  . Years of Education: N/A   Occupational History  . 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    Social History Main Topics  . Smoking status: Never Smoker   . Smokeless tobacco: Never Used  . Alcohol Use: Yes     Comment: 10/02/2013 "couple beers; couple times/month"  . Drug Use: No  . Sexual Activity: Not Currently   Other Topics Concern  . Not on file   Social History Narrative  . No narrative on file    Family History  Problem Relation Age of Onset  . Arthritis Mother   . Hypertension Mother   . Diabetes Father   . Coronary artery disease Father 15    Died age 48  . Kidney disease Father   . Prostate cancer Paternal Uncle     Great  . Hypertension Sister   . Hypertension Sister     2nd Sister  . Alcohol abuse Brother   . Cancer Neg Hx   . Drug abuse Neg Hx   . Early death Neg Hx   . Stroke Neg Hx     Review of Systems:  As stated in the HPI and otherwise negative.   BP 110/70  Pulse 90  Ht 5\' 10"  (1.778 m)  Wt 252 lb (114.306 kg)  BMI 36.16 kg/m2  SpO2 97%  Physical Examination: General: Well developed, well nourished, NAD HEENT: OP clear, mucus membranes moist SKIN: warm, dry. No rashes. Neuro: No focal deficits Musculoskeletal: Muscle strength 5/5 all ext Psychiatric: Mood and affect normal Neck: No JVD, no carotid bruits, no thyromegaly, no lymphadenopathy. Lungs:Clear bilaterally, no wheezes, rhonci, crackles Cardiovascular: Regular rate and rhythm. No murmurs, gallops or rubs. Abdomen:Soft. Bowel sounds present. Non-tender.  Extremities: No lower extremity edema. Pulses are 2 + in the bilateral DP/PT.  Cardiac cath 10/03/13:  The left main coronary artery is widely patent with smooth distal 30% narrowing.  The left anterior  descending artery is totally occluded proximal..  The left circumflex artery is widely patent with 50% mid vessel narrowing. The first and second marginal branches are patent. The second and third marginal branches are  totally occluded. The left circumflex PDA is widely patent. Retrograde filling of the sequential graft to the fourth and third marginal branch is noted.  The right coronary artery is patent but there is severe disease in the acute marginal branch which is a relatively large vessel. It is severely and diffusely involved with a proximal 80% stenosis followed by a proximal 80% stenosis in the mid to distal eccentric 80% stenosis. The continuation of the right coronary beyond the acute marginal branch is 99% obstructed.  BYPASS GRAFT ANGIOGRAPHY: SVG to the ramus intermedius branch) first obtuse marginal) is widely patent.  SVG sequential to the obtuse marginal branches is totally occluded with thrombus present beyond the side to side anastomosis with a small first branch.  SVG to acute marginal of the right coronary is totally occluded at the aorto ostial.  LIMA to the LAD is widely patent  LEFT VENTRICULOGRAM: Left ventricular angiogram was done in the 30 RAO projection and revealed inferior hypokinesis. Overall ejection fraction is normal and estimated to be 50%.   Assessment and Plan:   1. CAD: Stable. He has no chest pain on Imdur. Will continue ASA, beta blocker, statin.   2. HTN: BP well controlled. No changes.   3. HLD: He is on a statin now. He had been off Crestor but did not tolerate due to leg cramps.  Continue Lipitor and check lipids and LFTs in 8 weeks.

## 2013-11-15 ENCOUNTER — Telehealth (HOSPITAL_COMMUNITY): Payer: Self-pay | Admitting: *Deleted

## 2013-11-15 NOTE — Telephone Encounter (Signed)
Received signed MD order.  Called and left message to please contact to schedule cardiac rehab.  Contact information provided.

## 2013-11-21 ENCOUNTER — Telehealth (HOSPITAL_COMMUNITY): Payer: Self-pay | Admitting: *Deleted

## 2013-11-21 NOTE — Telephone Encounter (Signed)
Message copied by Rowe Pavy on Wed Nov 21, 2013  8:23 AM ------      Message from: [facility], [place]: Sat Nov 17, 2013 11:39 AM      Regarding: RE: Madaline Brilliant to proceed with exercise       OK to exercise per cardiac rehab guidelines. No restrictions from my stand point      ----- Message -----         From: Rowe Pavy, RN         Sent: 11/16/2013   5:26 PM           To: Heath Lark, MD      Subject: Madaline Brilliant to proceed with exercise                              Pt is eligible to attend cardiac rehab s/p Nstemi on 10/02/13.  Tentatively(pending your approval) to start rehab on 11/26/13 at 6:45.  Pt reports that he has a port and will be evaluated on Monday by a surgeon on September 14th for removal.             May pt proceed with exercise?      If so, any restrictions of activity during exercise?            Thank you for your input            Maurice Small RN       ------

## 2013-11-22 ENCOUNTER — Encounter (HOSPITAL_COMMUNITY)
Admission: RE | Admit: 2013-11-22 | Discharge: 2013-11-22 | Disposition: A | Discharge status: Home / Self Care | Payer: BC Managed Care – PPO | Source: Ambulatory Visit | Attending: Cardiovascular Disease | Admitting: Cardiovascular Disease

## 2013-11-22 DIAGNOSIS — Z951 Presence of aortocoronary bypass graft: Secondary | ICD-10-CM | POA: Insufficient documentation

## 2013-11-22 DIAGNOSIS — I2510 Atherosclerotic heart disease of native coronary artery without angina pectoris: Secondary | ICD-10-CM | POA: Insufficient documentation

## 2013-11-22 DIAGNOSIS — I252 Old myocardial infarction: Secondary | ICD-10-CM | POA: Insufficient documentation

## 2013-11-22 NOTE — Progress Notes (Signed)
Cardiac Rehab Medication Review by a Pharmacist  Does the patient  feel that his/her medications are working for him/her?  yes  Has the patient been experiencing any side effects to the medications prescribed?  no  Does the patient measure his/her own blood pressure or blood glucose at home?  no   Does the patient have any problems obtaining medications due to transportation or finances?   no  Understanding of regimen: excellent Understanding of indications: excellent Potential of compliance: excellent      Pharmacist comments: Devon Brady is a 89 yom presenting to the cardiac rehab clinic.  He brought in his medications from home.  He noted he recently bought some herbal products for ED but has not taken them.  He is unaware of the name of the product.  He was instructed to inform in PCP of his new medications before taking them.  He is experiencing no side effects with his medications is appears knowledgeable about them.  We discussed the importance of maintaining consistency and I addressed his questions.  He does not monitor his BP at home but does have a device at home.   Devon Brady, Pharm. D. Clinical Pharmacy Resident Pager: [phone] Ph: [phone] 11/22/2013 9:03 AM

## 2013-11-25 ENCOUNTER — Encounter: Payer: Self-pay | Admitting: Internal Medicine

## 2013-11-26 ENCOUNTER — Ambulatory Visit (INDEPENDENT_AMBULATORY_CARE_PROVIDER_SITE_OTHER): Payer: BC Managed Care – PPO | Admitting: General Surgery

## 2013-11-26 ENCOUNTER — Other Ambulatory Visit (INDEPENDENT_AMBULATORY_CARE_PROVIDER_SITE_OTHER): Payer: Self-pay | Admitting: General Surgery

## 2013-11-26 ENCOUNTER — Encounter (HOSPITAL_COMMUNITY)
Admission: RE | Admit: 2013-11-26 | Discharge: 2013-11-26 | Disposition: A | Discharge status: Home / Self Care | Payer: BC Managed Care – PPO | Source: Ambulatory Visit | Attending: Cardiovascular Disease | Admitting: Cardiovascular Disease

## 2013-11-26 DIAGNOSIS — Z951 Presence of aortocoronary bypass graft: Secondary | ICD-10-CM | POA: Diagnosis not present

## 2013-11-26 DIAGNOSIS — I2510 Atherosclerotic heart disease of native coronary artery without angina pectoris: Secondary | ICD-10-CM | POA: Diagnosis present

## 2013-11-26 DIAGNOSIS — I252 Old myocardial infarction: Secondary | ICD-10-CM | POA: Diagnosis not present

## 2013-11-26 NOTE — Progress Notes (Signed)
Pt in today for his first day of exercise at the 6:45 am exercise class time.  Pt is a prior participant in cardiac rehab back in 2010.  Pt familiar with the flow of the class.  Monitor shows sr with no noted ectopy. Medication list reconciled.  PHQ2 score 0.  Pt states he is a positive person and he feels knowledgeable about what to expect.  Pt reports his short term goal is to increase stamina.  Advised pt to exercise regularly on the days he does not attend exercise within appropriate guidelines and progress as tolerated. Pt will receive home exercise guidelines from the Exercise specialist.  Will monitor his MET levels.  Pt long term goal is decrease wight and to get back in the exercise routine.  Pt informed of the nutritional classes he may attend on tuesdays and the availability of the dietician for one on one consults to address nutritional strategies for losing weight.  Will monitor his progress toward meeting these goals.  Cherre Huger, BSN

## 2013-11-26 NOTE — H&P (Signed)
Devon Brady B. Devon Brady 11/26/2013 8:40 AM Location: [facility] Patient #: [id number] DOB: [birth year] Married / Language: English / Race: Black or African American Male History of Present Illness [name] MD; 11/26/2013 9:25 AM) Patient words: port removal.  The patient is a 63 year old male who presents with Hodgkins lymphoma. 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 Initial presentation was 2 year(s) ago. Treatment has included chemotherapy. The patient describes this as resolved. The patient had to have removal of fibrin sheath x 2 off the port a cath. He is now in clinical remission. We are asked to remove port a cath.  Other Problems [facility] [place], [place]; 11/26/2013 8:42 AM) Back Pain Cancer High blood pressure Hypercholesterolemia Thyroid Cancer Thyroid Disease  Past Surgical History ([place], [place]; 11/26/2013 8:42 AM) Bypass Surgery for Poor Blood Flow to Legs Colon Polyp Removal - Colonoscopy Coronary Artery Bypass Graft Sentinel Lymph Node Biopsy Thyroid Surgery Vasectomy  Diagnostic Studies History ([place], [place]; 11/26/2013 8:42 AM) Colonoscopy 1-5 years ago  Allergies ([place], [place]; 11/26/2013 8:42 AM) Crestor *ANTIHYPERLIPIDEMICS*  Medication History ([place], RMA; 11/26/2013 8:43 AM) Aspirin EC (81MG  Tablet DR, Oral) Active. Lipitor (20MG  Tablet, Oral) Active. Co Q 10 (30MG  Capsule, Oral) Active. Imdur (60MG  Tablet ER 24HR, Oral) Active. Lactobacillus (Oral) Active. Levothyroxine Sodium (112MCG Tablet, Oral) Active. Metoprolol Succinate ER (50MG  Tablet ER 24HR, Oral) Active. Multiple Vitamins (Oral) Active. Nitrostat (0.4MG  Tab Sublingual, Sublingual) Active. Omega 3 (1000MG  Capsule, Oral) Active. Medications Reconciled  Social History [facility] Bergeron [place], [place]; 11/26/2013 8:42 AM) Caffeine use  Coffee. No drug use Tobacco use Never smoker.  Family History ([place], [place]; 11/26/2013 8:42 AM) Alcohol Abuse Brother. Heart disease in male family member before age 17 Heart disease in male family member before age 47 Hypertension Father, Mother, Sister.     Review of Systems ([place] RMA; 11/26/2013 8:42 AM) General Not Present- Appetite Loss, Chills, Fatigue, Fever, Night Sweats, Weight Gain and Weight Loss. Skin Not Present- Change in Wart/Mole, Dryness, Hives, Jaundice, New Lesions, Non-Healing Wounds, Rash and Ulcer. HEENT Not Present- Earache, Hearing Loss, Hoarseness, Nose Bleed, Oral Ulcers, Ringing in the Ears, Seasonal Allergies, Sinus Pain, Sore Throat, Visual Disturbances, Wears glasses/contact lenses and Yellow Eyes. Respiratory Not Present- Bloody sputum, Chronic Cough, Difficulty Breathing, Snoring and Wheezing. Breast Not Present- Breast Mass, Breast Pain, Nipple Discharge and Skin Changes. Cardiovascular Not Present- Chest Pain, Difficulty Breathing Lying Down, Leg Cramps, Palpitations, Rapid Heart Rate, Shortness of Breath and Swelling of Extremities. Gastrointestinal Not Present- Abdominal Pain, Bloating, Bloody Stool, Change in Bowel Habits, Chronic diarrhea, Constipation, Difficulty Swallowing, Excessive gas, Gets full quickly at meals, Hemorrhoids, Indigestion, Nausea, Rectal Pain and Vomiting. Male Genitourinary Not Present- Blood in Urine, Change in Urinary Stream, Frequency, Impotence, Nocturia, Painful Urination, Urgency and Urine Leakage. Musculoskeletal Present- Joint Stiffness. Not Present- Back Pain, Joint Pain, Muscle Pain, Muscle Weakness and Swelling of Extremities. Neurological Not Present- Decreased Memory, Fainting, Headaches, Numbness, Seizures, Tingling, Tremor, Trouble walking and Weakness. Psychiatric Not Present- Anxiety, Bipolar, Change in Sleep Pattern, Depression, Fearful and Frequent crying. Endocrine Not  Present- Cold Intolerance, Excessive Hunger, Hair Changes, Heat Intolerance, Hot flashes and New Diabetes. Hematology Not Present- Easy Bruising, Excessive bleeding, Gland problems, HIV and Persistent Infections.  Vitals ([place] R[place]; 11/26/2013 8:42 AM) 11/26/2013 8:40 AM Weight: 250.8 lb Height: 70.5in Body Surface Area: 2.38 m Body Mass Index: 35.48 kg/m Temp.: 97.45F(Oral)  Pulse: 74 (Regular)  P.OX:  98% (Room air) BP: 116/74 (Sitting, Right Arm, Standard)     Physical Exam [name] MD; 11/26/2013 9:26 AM)  General Mental Status-Alert. General Appearance-Consistent with stated age. Hydration-Well hydrated. Voice-Normal.  Head and Neck Head-normocephalic, atraumatic with no lesions or palpable masses. Trachea-midline. Thyroid Gland Characteristics - normal size and consistency.  Eye Eyeball - Bilateral-Extraocular movements intact. Sclera/Conjunctiva - Bilateral-No scleral icterus.  Chest and Lung Exam Chest and lung exam reveals -quiet, even and easy respiratory effort with no use of accessory muscles and on auscultation, normal breath sounds, no adventitious sounds and normal vocal resonance. Inspection Chest Wall - Chest Wall - Inspection of the chest wall reveals - Note: left subclavian port a cath. Back - normal.  Cardiovascular Cardiovascular examination reveals -normal heart sounds, regular rate and rhythm with no murmurs and normal pedal pulses bilaterally.  Abdomen Inspection Inspection of the abdomen reveals - No Hernias. Skin - Scar - no surgical scars. Palpation/Percussion Palpation and Percussion of the abdomen reveal - Soft, No Rebound tenderness, No Rigidity (guarding) and No hepatosplenomegaly. Auscultation Auscultation of the abdomen reveals - Bowel sounds normal.  Neurologic Neurologic evaluation reveals -alert and oriented x 3 with no impairment of recent or remote memory. Mental  Status-Normal.  Musculoskeletal Normal Exam - Left-Upper Extremity Strength Normal and Lower Extremity Strength Normal. Normal Exam - Right-Upper Extremity Strength Normal and Lower Extremity Strength Normal.  Lymphatic Head & Neck  General Head & Neck Lymphatics: Bilateral - Description - Normal. Axillary  General Axillary Region: Bilateral - Description - Normal. Tenderness - Non Tender.    Assessment & Plan [name] MD; 11/26/2013 9:30 AM)  Charlett Nose DISEASE IN REMISSION (201.90  C81.90) Impression: Will schedule for port a cath removal.  Main risks are bleeding, infection, swelling, pain.  I advised no shower for 48 hours.  No lifting or strenuous activity x 1 week. He will need to hold the stair stepper in cardiac rehab for 1 week, but treadmill OK.  Current Plans Schedule for Surgery Pt Education - port removal

## 2013-11-28 ENCOUNTER — Encounter (HOSPITAL_COMMUNITY)
Admission: RE | Admit: 2013-11-28 | Discharge: 2013-11-28 | Disposition: A | Discharge status: Home / Self Care | Payer: BC Managed Care – PPO | Source: Ambulatory Visit | Attending: Cardiovascular Disease | Admitting: Cardiovascular Disease

## 2013-11-28 DIAGNOSIS — I2510 Atherosclerotic heart disease of native coronary artery without angina pectoris: Secondary | ICD-10-CM | POA: Diagnosis not present

## 2013-11-30 ENCOUNTER — Encounter (HOSPITAL_COMMUNITY)
Admission: RE | Admit: 2013-11-30 | Discharge: 2013-11-30 | Disposition: A | Discharge status: Home / Self Care | Payer: BC Managed Care – PPO | Source: Ambulatory Visit | Attending: Cardiovascular Disease | Admitting: Cardiovascular Disease

## 2013-11-30 DIAGNOSIS — I2510 Atherosclerotic heart disease of native coronary artery without angina pectoris: Secondary | ICD-10-CM | POA: Diagnosis not present

## 2013-12-03 ENCOUNTER — Encounter (HOSPITAL_COMMUNITY)
Admission: RE | Admit: 2013-12-03 | Discharge: 2013-12-03 | Disposition: A | Discharge status: Home / Self Care | Payer: BC Managed Care – PPO | Source: Ambulatory Visit | Attending: Cardiovascular Disease | Admitting: Cardiovascular Disease

## 2013-12-03 ENCOUNTER — Encounter (HOSPITAL_BASED_OUTPATIENT_CLINIC_OR_DEPARTMENT_OTHER): Payer: Self-pay | Admitting: *Deleted

## 2013-12-03 DIAGNOSIS — I2510 Atherosclerotic heart disease of native coronary artery without angina pectoris: Secondary | ICD-10-CM | POA: Diagnosis not present

## 2013-12-03 NOTE — Progress Notes (Signed)
Bring all medications

## 2013-12-04 NOTE — Progress Notes (Addendum)
Dr. Al Corpus reviewed history - Ok for surgery. Pt had Ekg in July and BMet on 11/05/2013

## 2013-12-05 ENCOUNTER — Encounter (HOSPITAL_COMMUNITY)
Admission: RE | Admit: 2013-12-05 | Discharge: 2013-12-05 | Disposition: A | Discharge status: Home / Self Care | Payer: BC Managed Care – PPO | Source: Ambulatory Visit | Attending: Cardiovascular Disease | Admitting: Cardiovascular Disease

## 2013-12-05 DIAGNOSIS — I2510 Atherosclerotic heart disease of native coronary artery without angina pectoris: Secondary | ICD-10-CM | POA: Diagnosis not present

## 2013-12-05 LAB — GLUCOSE, CAPILLARY: GLUCOSE-CAPILLARY: 118 mg/dL — AB (ref 70–99)

## 2013-12-06 ENCOUNTER — Telehealth (HOSPITAL_COMMUNITY): Payer: Self-pay | Admitting: *Deleted

## 2013-12-06 ENCOUNTER — Encounter (HOSPITAL_BASED_OUTPATIENT_CLINIC_OR_DEPARTMENT_OTHER): Payer: Self-pay | Admitting: Certified Registered"

## 2013-12-06 ENCOUNTER — Encounter (HOSPITAL_BASED_OUTPATIENT_CLINIC_OR_DEPARTMENT_OTHER): Payer: BC Managed Care – PPO | Admitting: Certified Registered"

## 2013-12-06 ENCOUNTER — Ambulatory Visit (HOSPITAL_BASED_OUTPATIENT_CLINIC_OR_DEPARTMENT_OTHER)
Admission: RE | Admit: 2013-12-06 | Discharge: 2013-12-06 | Disposition: A | Discharge status: Home / Self Care | Payer: BC Managed Care – PPO | Source: Ambulatory Visit | Attending: General Surgery | Admitting: General Surgery

## 2013-12-06 ENCOUNTER — Encounter (HOSPITAL_BASED_OUTPATIENT_CLINIC_OR_DEPARTMENT_OTHER)
Admission: RE | Disposition: A | Discharge status: Home / Self Care | Payer: Self-pay | Source: Ambulatory Visit | Attending: General Surgery

## 2013-12-06 ENCOUNTER — Ambulatory Visit (HOSPITAL_BASED_OUTPATIENT_CLINIC_OR_DEPARTMENT_OTHER): Payer: BC Managed Care – PPO | Admitting: Certified Registered"

## 2013-12-06 DIAGNOSIS — Z7982 Long term (current) use of aspirin: Secondary | ICD-10-CM | POA: Insufficient documentation

## 2013-12-06 DIAGNOSIS — Z85850 Personal history of malignant neoplasm of thyroid: Secondary | ICD-10-CM | POA: Insufficient documentation

## 2013-12-06 DIAGNOSIS — E7800 Pure hypercholesterolemia, unspecified: Secondary | ICD-10-CM | POA: Diagnosis not present

## 2013-12-06 DIAGNOSIS — C8190 Hodgkin lymphoma, unspecified, unspecified site: Secondary | ICD-10-CM | POA: Diagnosis not present

## 2013-12-06 DIAGNOSIS — I10 Essential (primary) hypertension: Secondary | ICD-10-CM | POA: Insufficient documentation

## 2013-12-06 DIAGNOSIS — Z888 Allergy status to other drugs, medicaments and biological substances status: Secondary | ICD-10-CM | POA: Diagnosis not present

## 2013-12-06 DIAGNOSIS — Z79899 Other long term (current) drug therapy: Secondary | ICD-10-CM | POA: Insufficient documentation

## 2013-12-06 DIAGNOSIS — Z452 Encounter for adjustment and management of vascular access device: Secondary | ICD-10-CM | POA: Insufficient documentation

## 2013-12-06 HISTORY — PX: PORT-A-CATH REMOVAL: SHX5289

## 2013-12-06 LAB — POCT HEMOGLOBIN-HEMACUE: Hemoglobin: 15.5 g/dL (ref 13.0–17.0)

## 2013-12-06 SURGERY — REMOVAL PORT-A-CATH
Anesthesia: Monitor Anesthesia Care | Site: Chest | Laterality: Left

## 2013-12-06 MED ORDER — ONDANSETRON HCL 4 MG/2ML IJ SOLN
INTRAMUSCULAR | Status: DC | PRN
  Administered 2013-12-06: 4 mg via INTRAVENOUS

## 2013-12-06 MED ORDER — PROPOFOL INFUSION 10 MG/ML OPTIME
INTRAVENOUS | Status: DC | PRN
  Administered 2013-12-06 (×2): 20 mL via INTRAVENOUS

## 2013-12-06 MED ORDER — LIDOCAINE HCL (CARDIAC) 20 MG/ML IV SOLN
INTRAVENOUS | Status: DC | PRN
  Administered 2013-12-06: 20 mg via INTRAVENOUS

## 2013-12-06 MED ORDER — FENTANYL CITRATE 0.05 MG/ML IJ SOLN
INTRAMUSCULAR | Status: AC
  Filled 2013-12-06: qty 4

## 2013-12-06 MED ORDER — MIDAZOLAM HCL 2 MG/2ML IJ SOLN
INTRAMUSCULAR | Status: AC
  Filled 2013-12-06: qty 4

## 2013-12-06 MED ORDER — MIDAZOLAM HCL 5 MG/5ML IJ SOLN
INTRAMUSCULAR | Status: DC | PRN
  Administered 2013-12-06: 2 mg via INTRAVENOUS

## 2013-12-06 MED ORDER — MIDAZOLAM HCL 2 MG/2ML IJ SOLN: 1.0000 mg | INTRAMUSCULAR | Status: DC | PRN

## 2013-12-06 MED ORDER — FENTANYL CITRATE 0.05 MG/ML IJ SOLN: 50.0000 ug | INTRAMUSCULAR | Status: DC | PRN

## 2013-12-06 MED ORDER — LACTATED RINGERS IV SOLN
INTRAVENOUS | Status: DC
  Administered 2013-12-06 (×2): via INTRAVENOUS

## 2013-12-06 MED ORDER — LIDOCAINE-EPINEPHRINE (PF) 1 %-1:200000 IJ SOLN
INTRAMUSCULAR | Status: DC | PRN
  Administered 2013-12-06: 14:00:00

## 2013-12-06 MED ORDER — FENTANYL CITRATE 0.05 MG/ML IJ SOLN
INTRAMUSCULAR | Status: DC | PRN
Start: 2013-12-06 — End: 2013-12-06
  Administered 2013-12-06 (×2): 50 ug via INTRAVENOUS

## 2013-12-06 MED ORDER — CEFAZOLIN SODIUM-DEXTROSE 2-3 GM-% IV SOLR
2.0000 g | INTRAVENOUS | Status: AC
  Administered 2013-12-06: 2 g via INTRAVENOUS

## 2013-12-06 MED ORDER — OXYCODONE-ACETAMINOPHEN 5-325 MG PO TABS: 1.0000 | ORAL_TABLET | Freq: Four times a day (QID) | ORAL | Status: DC | PRN

## 2013-12-06 MED ORDER — DEXAMETHASONE SODIUM PHOSPHATE 10 MG/ML IJ SOLN
INTRAMUSCULAR | Status: DC | PRN
  Administered 2013-12-06: 10 mg via INTRAVENOUS

## 2013-12-06 MED ORDER — BUPIVACAINE HCL (PF) 0.25 % IJ SOLN
INTRAMUSCULAR | Status: AC
  Filled 2013-12-06: qty 30

## 2013-12-06 SURGICAL SUPPLY — 38 items
BLADE HEX COATED 2.75 (ELECTRODE) ×3 IMPLANT
BLADE SURG 15 STRL LF DISP TIS (BLADE) ×1 IMPLANT
BLADE SURG 15 STRL SS (BLADE) ×2
CANISTER SUCT 1200ML W/VALVE (MISCELLANEOUS) IMPLANT
CHLORAPREP W/TINT 26ML (MISCELLANEOUS) ×3 IMPLANT
COVER MAYO STAND STRL (DRAPES) ×3 IMPLANT
COVER TABLE BACK 60X90 (DRAPES) ×3 IMPLANT
DECANTER SPIKE VIAL GLASS SM (MISCELLANEOUS) IMPLANT
DERMABOND ADVANCED (GAUZE/BANDAGES/DRESSINGS) ×2
DERMABOND ADVANCED .7 DNX12 (GAUZE/BANDAGES/DRESSINGS) ×1 IMPLANT
DRAPE PED LAPAROTOMY (DRAPES) ×3 IMPLANT
DRAPE UTILITY XL STRL (DRAPES) ×3 IMPLANT
ELECT REM PT RETURN 9FT ADLT (ELECTROSURGICAL) ×3
ELECTRODE REM PT RTRN 9FT ADLT (ELECTROSURGICAL) ×1 IMPLANT
GLOVE BIO SURGEON STRL SZ 6 (GLOVE) ×3 IMPLANT
GLOVE BIOGEL PI IND STRL 6.5 (GLOVE) ×1 IMPLANT
GLOVE BIOGEL PI IND STRL 7.5 (GLOVE) ×1 IMPLANT
GLOVE BIOGEL PI INDICATOR 6.5 (GLOVE) ×2
GLOVE BIOGEL PI INDICATOR 7.5 (GLOVE) ×2
GLOVE SURG SS PI 7.5 STRL IVOR (GLOVE) ×3 IMPLANT
GOWN STRL REUS W/ TWL LRG LVL3 (GOWN DISPOSABLE) IMPLANT
GOWN STRL REUS W/ TWL XL LVL3 (GOWN DISPOSABLE) ×1 IMPLANT
GOWN STRL REUS W/TWL 2XL LVL3 (GOWN DISPOSABLE) ×3 IMPLANT
GOWN STRL REUS W/TWL LRG LVL3 (GOWN DISPOSABLE)
GOWN STRL REUS W/TWL XL LVL3 (GOWN DISPOSABLE) ×2
NEEDLE HYPO 25X1 1.5 SAFETY (NEEDLE) ×3 IMPLANT
NS IRRIG 1000ML POUR BTL (IV SOLUTION) IMPLANT
PACK BASIN DAY SURGERY FS (CUSTOM PROCEDURE TRAY) ×3 IMPLANT
PENCIL BUTTON HOLSTER BLD 10FT (ELECTRODE) ×3 IMPLANT
SUT MNCRL AB 4-0 PS2 18 (SUTURE) ×3 IMPLANT
SUT VIC AB 3-0 SH 27 (SUTURE) ×2
SUT VIC AB 3-0 SH 27X BRD (SUTURE) ×1 IMPLANT
SYR CONTROL 10ML LL (SYRINGE) ×3 IMPLANT
TOWEL OR 17X24 6PK STRL BLUE (TOWEL DISPOSABLE) ×3 IMPLANT
TOWEL OR NON WOVEN STRL DISP B (DISPOSABLE) ×3 IMPLANT
TUBE CONNECTING 20'X1/4 (TUBING)
TUBE CONNECTING 20X1/4 (TUBING) IMPLANT
YANKAUER SUCT BULB TIP NO VENT (SUCTIONS) IMPLANT

## 2013-12-06 NOTE — Op Note (Signed)
  PRE-OPERATIVE DIAGNOSIS:  un-needed Port-A-Cath for lymphoma  POST-OPERATIVE DIAGNOSIS:  Same   PROCEDURE:  Procedure(s):  REMOVAL PORT-A-CATH  SURGEON:  Surgeon(s):  [name], MD  ANESTHESIA:   MAC + local  EBL:   Minimal  SPECIMEN:  None  Complications : none known  Procedure:   Pt was  identified in the holding area and taken to the operating room where he was placed supine on the operating room table.  MAC anesthesia was induced.  The L upper chest was prepped and draped.  The prior incision was anesthetized with local anesthetic.  The incision was opened with a #15 blade.  The subcutaneous tissue was divided with the cautery.  The port was identified and the capsule opened.  The 4 2-0 prolene sutures were removed.  The port was then removed and pressure held on the tract.  The catheter appeared intact without evidence of breakage (23.5 cm).  The wound was inspected for hemostasis, which was achieved with cautery.  The wound was closed with 3-0 vicryl deep dermal interrupted sutures and 4-0 Monocryl running subcuticular suture.  The wound was cleaned, dried, and dressed with dermabond.  The patient was awakened from anesthesia and taken to the PACU in stable condition.  Needle, sponge, and instrument counts are correct.

## 2013-12-06 NOTE — Discharge Instructions (Addendum)
[place] Office Phone Number [phone]   POST OP INSTRUCTIONS  Always review your discharge instruction sheet given to you by the facility where your surgery was performed.  IF YOU HAVE DISABILITY OR FAMILY LEAVE FORMS, YOU MUST BRING THEM TO THE OFFICE FOR PROCESSING.  DO NOT GIVE THEM TO YOUR DOCTOR.  1. A prescription for pain medication may be given to you upon discharge.  Take your pain medication as prescribed, if needed.  If narcotic pain medicine is not needed, then you may take acetaminophen (Tylenol) or ibuprofen (Advil) as needed. 2. Take your usually prescribed medications unless otherwise directed 3. If you need a refill on your pain medication, please contact your pharmacy.  They will contact our office to request authorization.  Prescriptions will not be filled after 5pm or on week-ends. 4. You should eat very light the first 24 hours after surgery, such as soup, crackers, pudding, etc.  Resume your normal diet the day after surgery 5. It is common to experience some constipation if taking pain medication after surgery.  Increasing fluid intake and taking a stool softener will usually help or prevent this problem from occurring.  A mild laxative (Milk of Magnesia or Miralax) should be taken according to package directions if there are no bowel movements after 48 hours. 6. You may shower in 48 hours.  The surgical glue will flake off in 2-3 weeks.   7. ACTIVITIES:  No strenuous activity or heavy lifting for 1 week.   a. You may drive when you no longer are taking prescription pain medication, you can comfortably wear a seatbelt, and you can safely maneuver your car and apply brakes. b. RETURN TO WORK:  __________3-7 days_______________ Dennis Bast should see your doctor in the office for a follow-up appointment approximately three-four weeks after your surgery.    WHEN TO CALL YOUR DOCTOR: 1. Fever over 101.0 2. Nausea and/or vomiting. 3. Extreme swelling or  bruising. 4. Continued bleeding from incision. 5. Increased pain, redness, or drainage from the incision.  The clinic staff is available to answer your questions during regular business hours.  Please dont hesitate to call and ask to speak to one of the nurses for clinical concerns.  If you have a medical emergency, go to the nearest emergency room or call 911.  A surgeon from [facility] is always on call at the hospital.  For further questions, please visit [email or web address]    Post Anesthesia Home Care Instructions  Activity: Get plenty of rest for the remainder of the day. A responsible adult should stay with you for 24 hours following the procedure.  For the next 24 hours, DO NOT: -Drive a car -Paediatric nurse -Drink alcoholic beverages -Take any medication unless instructed by your physician -Make any legal decisions or sign important papers.  Meals: Start with liquid foods such as gelatin or soup. Progress to regular foods as tolerated. Avoid greasy, spicy, heavy foods. If nausea and/or vomiting occur, drink only clear liquids until the nausea and/or vomiting subsides. Call your physician if vomiting continues.  Special Instructions/Symptoms: Your throat may feel dry or sore from the anesthesia or the breathing tube placed in your throat during surgery. If this causes discomfort, gargle with warm salt water. The discomfort should disappear within 24 hours.

## 2013-12-06 NOTE — Telephone Encounter (Signed)
Message copied by Rowe Pavy on Thu Dec 06, 2013  1:55 PM ------      Message from: [name]      Created: Thu Dec 06, 2013  1:53 PM      Regarding: RE: ok to return to cardiac rehab       Yes.  No restrictions after 1 week.       tx      FB            ----- Message -----         From: Rowe Pavy, RN         Sent: 12/06/2013  12:25 PM           To: [name], MD      Subject: RE: ok to return to cardiac rehab                        To clarify. Pt may return to exercise on Monday (next scheduled is tomorrow which I told him to hold from exercise) with restriction of  treadmill and stationary bike (no arms). After one week resume remaining activities?            Thanks      [place]      ----- Message -----         From: [name], MD         Sent: 12/06/2013  12:12 PM           To: Rowe Pavy, RN      Subject: RE: ok to return to cardiac rehab                        Pt may do treadmill and stationary bike, then may resume remaining activities in 1 week.        tx      [name]      ----- Message -----         From: Rowe Pavy, RN         Sent: 12/06/2013   9:07 AM           To: [name], MD      Subject: ok to return to cardiac rehab                            Dr. Barry Dienes,            Pt participates in cardiac rehab exercise program three times a week.  Pt will have his port a cath removed today.  When may pt resume exercise?  Any restrictions of activity?  He presently uses treadmill, stationary bike, recumbent stepper and light hand weights 3-4 pounds.                  Thanks for your input      [employer] RN                    ------

## 2013-12-06 NOTE — Interval H&P Note (Signed)
History and Physical Interval Note:  12/06/2013 1:10 PM  Devon Brady  has presented today for surgery, with the diagnosis of lymphoma  The various methods of treatment have been discussed with the patient and family. After consideration of risks, benefits and other options for treatment, the patient has consented to  Procedure(s): REMOVAL PORT-A-CATH (N/A) as a surgical intervention .  The patient's history has been reviewed, patient examined, no change in status, stable for surgery.  I have reviewed the patient's chart and labs.  Questions were answered to the patient's satisfaction.     [name]

## 2013-12-06 NOTE — Anesthesia Procedure Notes (Signed)
Procedure Name: MAC Date/Time: 12/06/2013 1:20 PM Performed by: Baxter Flattery Pre-anesthesia Checklist: Patient identified, Emergency Drugs available, Suction available and Patient being monitored Patient Re-evaluated:Patient Re-evaluated prior to inductionOxygen Delivery Method: Simple face mask Preoxygenation: Pre-oxygenation with 100% oxygen Intubation Type: IV induction Dental Injury: Teeth and Oropharynx as per pre-operative assessment

## 2013-12-06 NOTE — Anesthesia Preprocedure Evaluation (Signed)
Anesthesia Evaluation  Patient identified by MRN, date of birth, ID band Patient awake    Reviewed: Allergy & Precautions, H&P , NPO status , Patient's Chart, lab work & pertinent test results, reviewed documented beta blocker date and time   Airway Mallampati: II TM Distance: >3 FB Neck ROM: Full    Dental  (+) Dental Advisory Given, Teeth Intact   Pulmonary sleep apnea ,    Pulmonary exam normal       Cardiovascular hypertension, Pt. on medications and Pt. on home beta blockers + angina + CAD and + Past MI     Neuro/Psych TIAnegative psych ROS   GI/Hepatic Neg liver ROS, hiatal hernia,   Endo/Other  Hypothyroidism   Renal/GU      Musculoskeletal   Abdominal   Peds  Hematology   Anesthesia Other Findings   Reproductive/Obstetrics                           Anesthesia Physical Anesthesia Plan  ASA: III  Anesthesia Plan: MAC   Post-op Pain Management:    Induction: Intravenous  Airway Management Planned: Simple Face Mask  Additional Equipment:   Intra-op Plan:   Post-operative Plan: Extubation in OR  Informed Consent: I have reviewed the patients History and Physical, chart, labs and discussed the procedure including the risks, benefits and alternatives for the proposed anesthesia with the patient or authorized representative who has indicated his/her understanding and acceptance.   Dental advisory given  Plan Discussed with: CRNA, Anesthesiologist and Surgeon  Anesthesia Plan Comments:         Anesthesia Quick Evaluation

## 2013-12-06 NOTE — H&P (View-Only) (Signed)
Devon Brady 11/26/2013 8:40 AM Location: [facility] Patient #: [phone] DOB: February 01, 1951 Married / Language: English / Race: Black or African American Male History of Present Illness [name] MD; 11/26/2013 9:25 AM) Patient words: port removal.  The patient is a 63 year old male who presents with Hodgkins lymphoma. 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 Initial presentation was 2 year(s) ago. Treatment has included chemotherapy. The patient describes this as resolved. The patient had to have removal of fibrin sheath x 2 off the port a cath. He is now in clinical remission. We are asked to remove port a cath.  Other Problems [facility] [place], [place]; 11/26/2013 8:42 AM) Back Pain Cancer High blood pressure Hypercholesterolemia Thyroid Cancer Thyroid Disease  Past Surgical History ([place], [place]; 11/26/2013 8:42 AM) Bypass Surgery for Poor Blood Flow to Legs Colon Polyp Removal - Colonoscopy Coronary Artery Bypass Graft Sentinel Lymph Node Biopsy Thyroid Surgery Vasectomy  Diagnostic Studies History ([place], [place]; 11/26/2013 8:42 AM) Colonoscopy 1-5 years ago  Allergies ([place], [place]; 11/26/2013 8:42 AM) Crestor *ANTIHYPERLIPIDEMICS*  Medication History ([place], [place]; 11/26/2013 8:43 AM) Aspirin EC (81MG  Tablet DR, Oral) Active. Lipitor (20MG  Tablet, Oral) Active. Co Q 10 (30MG  Capsule, Oral) Active. Imdur (60MG  Tablet ER 24HR, Oral) Active. Lactobacillus (Oral) Active. Levothyroxine Sodium (112MCG Tablet, Oral) Active. Metoprolol Succinate ER (50MG  Tablet ER 24HR, Oral) Active. Multiple Vitamins (Oral) Active. Nitrostat (0.4MG  Tab Sublingual, Sublingual) Active. Omega 3 (1000MG  Capsule, Oral) Active. Medications Reconciled  Social History Alexander Bergeron [place], [place]; 11/26/2013 8:42 AM) Caffeine use  Coffee. No drug use Tobacco use Never smoker.  Family History ([place], [place]; 11/26/2013 8:42 AM) Alcohol Abuse Brother. Heart disease in male family member before age 53 Heart disease in male family member before age 24 Hypertension Father, Mother, Sister.     Review of Systems ([place] [facility]; 11/26/2013 8:42 AM) General Not Present- Appetite Loss, Chills, Fatigue, Fever, Night Sweats, Weight Gain and Weight Loss. Skin Not Present- Change in Wart/Mole, Dryness, Hives, Jaundice, New Lesions, Non-Healing Wounds, Rash and Ulcer. HEENT Not Present- Earache, Hearing Loss, Hoarseness, Nose Bleed, Oral Ulcers, Ringing in the Ears, Seasonal Allergies, Sinus Pain, Sore Throat, Visual Disturbances, Wears glasses/contact lenses and Yellow Eyes. Respiratory Not Present- Bloody sputum, Chronic Cough, Difficulty Breathing, Snoring and Wheezing. Breast Not Present- Breast Mass, Breast Pain, Nipple Discharge and Skin Changes. Cardiovascular Not Present- Chest Pain, Difficulty Breathing Lying Down, Leg Cramps, Palpitations, Rapid Heart Rate, Shortness of Breath and Swelling of Extremities. Gastrointestinal Not Present- Abdominal Pain, Bloating, Bloody Stool, Change in Bowel Habits, Chronic diarrhea, Constipation, Difficulty Swallowing, Excessive gas, Gets full quickly at meals, Hemorrhoids, Indigestion, Nausea, Rectal Pain and Vomiting. Male Genitourinary Not Present- Blood in Urine, Change in Urinary Stream, Frequency, Impotence, Nocturia, Painful Urination, Urgency and Urine Leakage. Musculoskeletal Present- Joint Stiffness. Not Present- Back Pain, Joint Pain, Muscle Pain, Muscle Weakness and Swelling of Extremities. Neurological Not Present- Decreased Memory, Fainting, Headaches, Numbness, Seizures, Tingling, Tremor, Trouble walking and Weakness. Psychiatric Not Present- Anxiety, Bipolar, Change in Sleep Pattern, Depression, Fearful and Frequent crying. Endocrine Not  Present- Cold Intolerance, Excessive Hunger, Hair Changes, Heat Intolerance, Hot flashes and New Diabetes. Hematology Not Present- Easy Bruising, Excessive bleeding, Gland problems, HIV and Persistent Infections.  Vitals ([place] [facility]; 11/26/2013 8:42 AM) 11/26/2013 8:40 AM Weight: 250.8 lb Height: 70.5in Body Surface Area: 2.38 m Body Mass Index: 35.48 kg/m Temp.: 97.94F(Oral)  Pulse: 74 (Regular)  P.OX:  98% (Room air) BP: 116/74 (Sitting, Right Arm, Standard)     Physical Exam [name] MD; 11/26/2013 9:26 AM)  General Mental Status-Alert. General Appearance-Consistent with stated age. Hydration-Well hydrated. Voice-Normal.  Head and Neck Head-normocephalic, atraumatic with no lesions or palpable masses. Trachea-midline. Thyroid Gland Characteristics - normal size and consistency.  Eye Eyeball - Bilateral-Extraocular movements intact. Sclera/Conjunctiva - Bilateral-No scleral icterus.  Chest and Lung Exam Chest and lung exam reveals -quiet, even and easy respiratory effort with no use of accessory muscles and on auscultation, normal breath sounds, no adventitious sounds and normal vocal resonance. Inspection Chest Wall - Chest Wall - Inspection of the chest wall reveals - Note: left subclavian port a cath. Back - normal.  Cardiovascular Cardiovascular examination reveals -normal heart sounds, regular rate and rhythm with no murmurs and normal pedal pulses bilaterally.  Abdomen Inspection Inspection of the abdomen reveals - No Hernias. Skin - Scar - no surgical scars. Palpation/Percussion Palpation and Percussion of the abdomen reveal - Soft, No Rebound tenderness, No Rigidity (guarding) and No hepatosplenomegaly. Auscultation Auscultation of the abdomen reveals - Bowel sounds normal.  Neurologic Neurologic evaluation reveals -alert and oriented x 3 with no impairment of recent or remote memory. Mental  Status-Normal.  Musculoskeletal Normal Exam - Left-Upper Extremity Strength Normal and Lower Extremity Strength Normal. Normal Exam - Right-Upper Extremity Strength Normal and Lower Extremity Strength Normal.  Lymphatic Head & Neck  General Head & Neck Lymphatics: Bilateral - Description - Normal. Axillary  General Axillary Region: Bilateral - Description - Normal. Tenderness - Non Tender.    Assessment & Plan [name] MD; 11/26/2013 9:30 AM)  Charlett Nose DISEASE IN REMISSION (201.90  C81.90) Impression: Will schedule for port a cath removal.  Main risks are bleeding, infection, swelling, pain.  I advised no shower for 48 hours.  No lifting or strenuous activity x 1 week. He will need to hold the stair stepper in cardiac rehab for 1 week, but treadmill OK.  Current Plans Schedule for Surgery Pt Education - port removal

## 2013-12-06 NOTE — Anesthesia Postprocedure Evaluation (Signed)
  Anesthesia Post-op Note  Patient: Devon Brady  Procedure(s) Performed: Procedure(s): REMOVAL PORT-A-CATH (Left)  Patient Location: PACU  Anesthesia Type: MAC   Level of Consciousness: awake, alert  and oriented  Airway and Oxygen Therapy: Patient Spontanous Breathing  Post-op Pain: mild  Post-op Assessment: Post-op Vital signs reviewed  Post-op Vital Signs: Reviewed  Last Vitals:  Filed Vitals:   12/06/13 1447  BP: 115/70  Pulse: 67  Temp: 36.4 C  Resp: 16    Complications: No apparent anesthesia complications

## 2013-12-06 NOTE — Transfer of Care (Signed)
Immediate Anesthesia Transfer of Care Note  Patient: Devon Brady  Procedure(s) Performed: Procedure(s): REMOVAL PORT-A-CATH (Left)  Patient Location: PACU  Anesthesia Type:MAC  Level of Consciousness: awake, alert , oriented and patient cooperative  Airway & Oxygen Therapy: Patient Spontanous Breathing and Patient connected to face mask oxygen  Post-op Assessment: Report given to PACU RN, Post -op Vital signs reviewed and stable and Patient moving all extremities  Post vital signs: Reviewed and stable  Complications: No apparent anesthesia complications

## 2013-12-07 ENCOUNTER — Encounter (HOSPITAL_COMMUNITY): Admission: RE | Admit: 2013-12-07 | Payer: BC Managed Care – PPO | Source: Ambulatory Visit

## 2013-12-07 ENCOUNTER — Encounter (HOSPITAL_BASED_OUTPATIENT_CLINIC_OR_DEPARTMENT_OTHER): Payer: Self-pay | Admitting: General Surgery

## 2013-12-10 ENCOUNTER — Encounter (HOSPITAL_COMMUNITY)
Admission: RE | Admit: 2013-12-10 | Discharge: 2013-12-10 | Disposition: A | Discharge status: Home / Self Care | Payer: BC Managed Care – PPO | Source: Ambulatory Visit | Attending: Cardiovascular Disease | Admitting: Cardiovascular Disease

## 2013-12-10 DIAGNOSIS — I2510 Atherosclerotic heart disease of native coronary artery without angina pectoris: Secondary | ICD-10-CM | POA: Diagnosis not present

## 2013-12-10 NOTE — Progress Notes (Signed)
I have reviewed home exercise with Devon Brady. The patient was advised to walk 2-4 days per week outside of CRP II for 30 minutes continuously.  Pt will also complete one additional day of hand weights outside of CRP II.  Progression of exercise prescription was discussed.  Reviewed THR, pulse, RPE, sign and symptoms, NTG use and when to call 911 or MD.  Pt voiced understanding.  Archie Endo, MS, ACSM RCEP 12/10/2013 2:56 PM

## 2013-12-12 ENCOUNTER — Encounter (HOSPITAL_COMMUNITY)
Admission: RE | Admit: 2013-12-12 | Discharge: 2013-12-12 | Disposition: A | Discharge status: Home / Self Care | Payer: BC Managed Care – PPO | Source: Ambulatory Visit | Attending: Cardiovascular Disease | Admitting: Cardiovascular Disease

## 2013-12-12 DIAGNOSIS — I2510 Atherosclerotic heart disease of native coronary artery without angina pectoris: Secondary | ICD-10-CM | POA: Diagnosis not present

## 2013-12-14 ENCOUNTER — Encounter (HOSPITAL_COMMUNITY)
Admission: RE | Admit: 2013-12-14 | Discharge: 2013-12-14 | Disposition: A | Discharge status: Home / Self Care | Payer: BC Managed Care – PPO | Source: Ambulatory Visit | Attending: Cardiovascular Disease | Admitting: Cardiovascular Disease

## 2013-12-14 DIAGNOSIS — I214 Non-ST elevation (NSTEMI) myocardial infarction: Secondary | ICD-10-CM | POA: Insufficient documentation

## 2013-12-17 ENCOUNTER — Encounter (HOSPITAL_COMMUNITY)
Admission: RE | Admit: 2013-12-17 | Discharge: 2013-12-17 | Disposition: A | Discharge status: Home / Self Care | Payer: BC Managed Care – PPO | Source: Ambulatory Visit | Attending: Cardiovascular Disease | Admitting: Cardiovascular Disease

## 2013-12-17 DIAGNOSIS — I214 Non-ST elevation (NSTEMI) myocardial infarction: Secondary | ICD-10-CM | POA: Diagnosis not present

## 2013-12-17 NOTE — Progress Notes (Signed)
Devon Brady Resides 63 y.o. male Nutrition Note Spoke with pt.  Nutrition Plan and Nutrition Survey goals reviewed with pt. Pt is following Step 2 of the Therapeutic Lifestyle Changes diet. Pt wants to lose wt. Pt has not been actively trying to lose wt but is thinking about starting [employer] "because I like to follow a plan." Pt encouraged to skip phase 1 of [employer]. Wt loss tips reviewed.  Pt is diabetic according to EMR. Pt denies dx of DM. Last A1c indicates blood glucose well-controlled. Pt educated re: A1c due to pt unaware of what A1c was/means. Pt does not check his CBG's at home and does not own a glucometer. This Probation officer went over Diabetes Education test results. Pt expressed understanding of the information reviewed. Pt aware of nutrition education classes offered.  Nutrition Diagnosis Food-and nutrition-related knowledge deficit related to lack of exposure to information as related to diagnosis of: ? CVD ? DM (A1c 6.5)  Obesity related to excessive energy intake as evidenced by a BMI of 35.6  Nutrition RX/ Estimated Daily Nutrition Needs for: wt loss  1750-2250 Kcal, 45-60 gm fat, 11-15 gm sat fat, 1.7-2.2 gm trans-fat, <1500 mg sodium, 250 gm CHO   Nutrition Intervention   Pt's individual nutrition plan reviewed with pt.   Benefits of adopting Therapeutic Lifestyle Changes discussed when Medficts reviewed.   Pt to attend the Portion Distortion class - met; 12/12/13   Pt to attend the  ? Nutrition I class                     ? Nutrition II class        ? Diabetes Blitz class       ? Diabetes Q & A class   Pt given handouts for: ? Hemoglobin A1c definition   Continue client-centered nutrition education by RD, as part of interdisciplinary care. Goal(s)   Pt to identify food quantities necessary to achieve: ? wt loss to a goal wt of 228-246 lb (103.4-111.6 kg) at graduation from cardiac rehab.    CBG concentrations in the normal range or as close to normal as is safely  possible. Monitor and Evaluate progress toward nutrition goal with team. Nutrition Risk:  Change to Moderate Derek Mound, M.Ed, RD, LDN, CDE 12/17/2013 7:49 AM

## 2013-12-19 ENCOUNTER — Encounter (HOSPITAL_COMMUNITY)
Admission: RE | Admit: 2013-12-19 | Discharge: 2013-12-19 | Disposition: A | Discharge status: Home / Self Care | Payer: BC Managed Care – PPO | Source: Ambulatory Visit | Attending: Cardiovascular Disease | Admitting: Cardiovascular Disease

## 2013-12-19 DIAGNOSIS — I214 Non-ST elevation (NSTEMI) myocardial infarction: Secondary | ICD-10-CM | POA: Diagnosis not present

## 2013-12-21 ENCOUNTER — Encounter (HOSPITAL_COMMUNITY)
Admission: RE | Admit: 2013-12-21 | Discharge: 2013-12-21 | Disposition: A | Discharge status: Home / Self Care | Payer: BC Managed Care – PPO | Source: Ambulatory Visit | Attending: Cardiovascular Disease | Admitting: Cardiovascular Disease

## 2013-12-21 DIAGNOSIS — I214 Non-ST elevation (NSTEMI) myocardial infarction: Secondary | ICD-10-CM | POA: Diagnosis not present

## 2013-12-24 ENCOUNTER — Encounter (HOSPITAL_COMMUNITY)
Admission: RE | Admit: 2013-12-24 | Discharge: 2013-12-24 | Disposition: A | Discharge status: Home / Self Care | Payer: BC Managed Care – PPO | Source: Ambulatory Visit | Attending: Cardiovascular Disease | Admitting: Cardiovascular Disease

## 2013-12-24 DIAGNOSIS — I214 Non-ST elevation (NSTEMI) myocardial infarction: Secondary | ICD-10-CM | POA: Diagnosis not present

## 2013-12-26 ENCOUNTER — Encounter (HOSPITAL_COMMUNITY)
Admission: RE | Admit: 2013-12-26 | Discharge: 2013-12-26 | Disposition: A | Discharge status: Home / Self Care | Payer: BC Managed Care – PPO | Source: Ambulatory Visit | Attending: Cardiovascular Disease | Admitting: Cardiovascular Disease

## 2013-12-26 DIAGNOSIS — I214 Non-ST elevation (NSTEMI) myocardial infarction: Secondary | ICD-10-CM | POA: Diagnosis not present

## 2013-12-27 ENCOUNTER — Encounter: Payer: Self-pay | Admitting: Internal Medicine

## 2013-12-27 ENCOUNTER — Ambulatory Visit (INDEPENDENT_AMBULATORY_CARE_PROVIDER_SITE_OTHER): Payer: BC Managed Care – PPO | Admitting: Internal Medicine

## 2013-12-27 ENCOUNTER — Other Ambulatory Visit (INDEPENDENT_AMBULATORY_CARE_PROVIDER_SITE_OTHER): Payer: BC Managed Care – PPO

## 2013-12-27 VITALS — BP 108/68 | HR 68 | Temp 97.9°F | Resp 20 | Wt 248.0 lb

## 2013-12-27 DIAGNOSIS — Z0000 Encounter for general adult medical examination without abnormal findings: Secondary | ICD-10-CM

## 2013-12-27 DIAGNOSIS — E1121 Type 2 diabetes mellitus with diabetic nephropathy: Secondary | ICD-10-CM

## 2013-12-27 DIAGNOSIS — N289 Disorder of kidney and ureter, unspecified: Secondary | ICD-10-CM

## 2013-12-27 DIAGNOSIS — E890 Postprocedural hypothyroidism: Secondary | ICD-10-CM

## 2013-12-27 DIAGNOSIS — E785 Hyperlipidemia, unspecified: Secondary | ICD-10-CM

## 2013-12-27 LAB — URINALYSIS, ROUTINE W REFLEX MICROSCOPIC
Bilirubin Urine: NEGATIVE
KETONES UR: NEGATIVE
Leukocytes, UA: NEGATIVE
Nitrite: NEGATIVE
PH: 5.5 (ref 5.0–8.0)
Specific Gravity, Urine: 1.025 (ref 1.000–1.030)
URINE GLUCOSE: NEGATIVE
Urobilinogen, UA: 0.2 (ref 0.0–1.0)

## 2013-12-27 LAB — LIPID PANEL
CHOL/HDL RATIO: 4
Cholesterol: 144 mg/dL (ref 0–200)
HDL: 32.8 mg/dL — ABNORMAL LOW (ref >39.00)
LDL Cholesterol: 89 mg/dL (ref 0–99)
NonHDL: 111.2
Triglycerides: 113 mg/dL (ref 0.0–149.0)
VLDL: 22.6 mg/dL (ref 0.0–40.0)

## 2013-12-27 LAB — HEMOGLOBIN A1C: HEMOGLOBIN A1C: 6.2 % (ref 4.6–6.5)

## 2013-12-27 LAB — PSA: PSA: 1.46 ng/mL (ref 0.10–4.00)

## 2013-12-27 LAB — CBC WITH DIFFERENTIAL/PLATELET
BASOS PCT: 0.4 % (ref 0.0–3.0)
Basophils Absolute: 0 10*3/uL (ref 0.0–0.1)
EOS ABS: 0.2 10*3/uL (ref 0.0–0.7)
EOS PCT: 3 % (ref 0.0–5.0)
HEMATOCRIT: 46.7 % (ref 39.0–52.0)
Hemoglobin: 15.6 g/dL (ref 13.0–17.0)
LYMPHS ABS: 1.9 10*3/uL (ref 0.7–4.0)
Lymphocytes Relative: 33.9 % (ref 12.0–46.0)
MCHC: 33.4 g/dL (ref 30.0–36.0)
MCV: 86.6 fl (ref 78.0–100.0)
MONO ABS: 0.4 10*3/uL (ref 0.1–1.0)
Monocytes Relative: 7.5 % (ref 3.0–12.0)
Neutro Abs: 3.1 10*3/uL (ref 1.4–7.7)
Neutrophils Relative %: 55.2 % (ref 43.0–77.0)
Platelets: 221 10*3/uL (ref 150.0–400.0)
RBC: 5.39 Mil/uL (ref 4.22–5.81)
RDW: 14.8 % (ref 11.5–15.5)
WBC: 5.6 10*3/uL (ref 4.0–10.5)

## 2013-12-27 LAB — COMPREHENSIVE METABOLIC PANEL
ALBUMIN: 3.6 g/dL (ref 3.5–5.2)
ALK PHOS: 60 U/L (ref 39–117)
ALT: 13 U/L (ref 0–53)
AST: 21 U/L (ref 0–37)
BUN: 26 mg/dL — AB (ref 6–23)
CO2: 28 mEq/L (ref 19–32)
Calcium: 9.9 mg/dL (ref 8.4–10.5)
Chloride: 107 mEq/L (ref 96–112)
Creatinine, Ser: 1.6 mg/dL — ABNORMAL HIGH (ref 0.4–1.5)
GFR: 55.77 mL/min — ABNORMAL LOW (ref >60.00)
Glucose, Bld: 86 mg/dL (ref 70–99)
POTASSIUM: 4.7 meq/L (ref 3.5–5.1)
SODIUM: 141 meq/L (ref 135–145)
TOTAL PROTEIN: 7.1 g/dL (ref 6.0–8.3)
Total Bilirubin: 0.7 mg/dL (ref 0.2–1.2)

## 2013-12-27 LAB — FECAL OCCULT BLOOD, GUAIAC: Fecal Occult Blood: NEGATIVE

## 2013-12-27 LAB — TSH: TSH: 0.03 u[IU]/mL — ABNORMAL LOW (ref 0.35–4.50)

## 2013-12-27 MED ORDER — LEVOTHYROXINE SODIUM 200 MCG PO TABS: 200.0000 ug | ORAL_TABLET | Freq: Every day | ORAL | Status: DC

## 2013-12-27 NOTE — Progress Notes (Signed)
Subjective:    Patient ID: Devon Brady, male    DOB: 1950-04-02, 63 y.o.   MRN: [id number]  Thyroid Problem Presents for follow-up visit. Patient reports no anxiety, cold intolerance, constipation, depressed mood, diaphoresis, diarrhea, dry skin, fatigue, hair loss, heat intolerance, hoarse voice, leg swelling, nail problem, palpitations, tremors, visual change, weight gain or weight loss. The symptoms have been improving. Past treatments include levothyroxine. The treatment provided significant relief. Prior procedures include thyroidectomy.      Review of Systems  Constitutional: Negative.  Negative for weight loss, weight gain, diaphoresis and fatigue.  HENT: Negative.  Negative for hoarse voice.   Eyes: Negative.   Respiratory: Negative.  Negative for cough, choking, chest tightness, shortness of breath and stridor.   Cardiovascular: Negative.  Negative for chest pain, palpitations and leg swelling.  Gastrointestinal: Negative.  Negative for abdominal pain, diarrhea and constipation.  Endocrine: Negative.  Negative for cold intolerance, heat intolerance, polydipsia, polyphagia and polyuria.  Genitourinary: Negative.   Musculoskeletal: Negative.  Negative for back pain, myalgias and neck pain.  Skin: Negative for rash.  Allergic/Immunologic: Negative.   Neurological: Negative.  Negative for dizziness, tremors, weakness, light-headedness, numbness and headaches.  Hematological: Negative.  Negative for adenopathy. Does not bruise/bleed easily.  Psychiatric/Behavioral: Negative.        Objective:   Physical Exam  Vitals reviewed. Constitutional: He is oriented to person, place, and time. He appears well-developed and well-nourished. No distress.  HENT:  Head: Normocephalic and atraumatic.  Mouth/Throat: Oropharynx is clear and moist. No oropharyngeal exudate.  Eyes: Conjunctivae are normal. Right eye exhibits no discharge. Left eye exhibits no discharge. No scleral  icterus.  Neck: Normal range of motion. Neck supple. No JVD present. No tracheal deviation present. No thyromegaly present.  Cardiovascular: Normal rate, regular rhythm, normal heart sounds and intact distal pulses.  Exam reveals no gallop and no friction rub.   No murmur heard. Pulmonary/Chest: Effort normal and breath sounds normal. No stridor. No respiratory distress. He has no wheezes. He has no rales. He exhibits no tenderness.  Abdominal: Soft. Bowel sounds are normal. He exhibits no distension and no mass. There is no tenderness. There is no rebound and no guarding. Hernia confirmed negative in the right inguinal area and confirmed negative in the left inguinal area.  Genitourinary: Rectum normal, testes normal and penis normal. Rectal exam shows no external hemorrhoid, no internal hemorrhoid, no fissure, no mass, no tenderness and anal tone normal. Guaiac negative stool. Prostate is enlarged (1+ smooth symm BPH). Prostate is not tender. Right testis shows no mass, no swelling and no tenderness. Right testis is descended. Left testis shows no mass, no swelling and no tenderness. Left testis is descended. Circumcised. No penile erythema or penile tenderness. No discharge found.  Musculoskeletal: Normal range of motion. He exhibits no edema and no tenderness.  Lymphadenopathy:    He has no cervical adenopathy.       Right: No inguinal adenopathy present.       Left: No inguinal adenopathy present.  Neurological: He is oriented to person, place, and time.  Skin: Skin is warm and dry. No rash noted. He is not diaphoretic. No erythema. No pallor.  Psychiatric: He has a normal mood and affect. His behavior is normal. Judgment and thought content normal.     Lab Results  Component Value Date   WBC 5.9 11/01/2013   HGB 15.5 12/06/2013   HCT 45.1 11/01/2013   PLT 182 11/01/2013   GLUCOSE  104 11/01/2013   CHOL 227* 10/03/2013   TRIG 67 10/03/2013   HDL 47 10/03/2013   LDLCALC 167* 10/03/2013   ALT  12 11/01/2013   AST 18 11/01/2013   NA 144 11/01/2013   K 4.4 11/01/2013   CL 106 10/04/2013   CREATININE 1.5* 11/01/2013   BUN 20.0 11/01/2013   CO2 23 11/01/2013   TSH 0.155* 10/02/2013   PSA 1.29 11/22/2012   INR 0.97 10/02/2013   HGBA1C 6.5* 10/02/2013       Assessment & Plan:

## 2013-12-27 NOTE — Patient Instructions (Signed)
Health Maintenance A healthy lifestyle and preventative care can promote health and wellness.  Maintain regular health, dental, and eye exams.  Eat a healthy diet. Foods like vegetables, fruits, whole grains, low-fat dairy products, and lean protein foods contain the nutrients you need and are low in calories. Decrease your intake of foods high in solid fats, added sugars, and salt. Get information about a proper diet from your health care provider, if necessary.  Regular physical exercise is one of the most important things you can do for your health. Most adults should get at least 150 minutes of moderate-intensity exercise (any activity that increases your heart rate and causes you to sweat) each week. In addition, most adults need muscle-strengthening exercises on 2 or more days a week.   Maintain a healthy weight. The body mass index (BMI) is a screening tool to identify possible weight problems. It provides an estimate of body fat based on height and weight. Your health care provider can find your BMI and can help you achieve or maintain a healthy weight. For males 20 years and older:  A BMI below 18.5 is considered underweight.  A BMI of 18.5 to 24.9 is normal.  A BMI of 25 to 29.9 is considered overweight.  A BMI of 30 and above is considered obese.  Maintain normal blood lipids and cholesterol by exercising and minimizing your intake of saturated fat. Eat a balanced diet with plenty of fruits and vegetables. Blood tests for lipids and cholesterol should begin at age 20 and be repeated every 5 years. If your lipid or cholesterol levels are high, you are over age 50, or you are at high risk for heart disease, you may need your cholesterol levels checked more frequently.Ongoing high lipid and cholesterol levels should be treated with medicines if diet and exercise are not working.  If you smoke, find out from your health care provider how to quit. If you do not use tobacco, do not  start.  Lung cancer screening is recommended for adults aged 55-80 years who are at high risk for developing lung cancer because of a history of smoking. A yearly low-dose CT scan of the lungs is recommended for people who have at least a 30-pack-year history of smoking and are current smokers or have quit within the past 15 years. A pack year of smoking is smoking an average of 1 pack of cigarettes a day for 1 year (for example, a 30-pack-year history of smoking could mean smoking 1 pack a day for 30 years or 2 packs a day for 15 years). Yearly screening should continue until the smoker has stopped smoking for at least 15 years. Yearly screening should be stopped for people who develop a health problem that would prevent them from having lung cancer treatment.  If you choose to drink alcohol, do not have more than 2 drinks per day. One drink is considered to be 12 oz (360 mL) of beer, 5 oz (150 mL) of wine, or 1.5 oz (45 mL) of liquor.  Avoid the use of street drugs. Do not share needles with anyone. Ask for help if you need support or instructions about stopping the use of drugs.  High blood pressure causes heart disease and increases the risk of stroke. Blood pressure should be checked at least every 1-2 years. Ongoing high blood pressure should be treated with medicines if weight loss and exercise are not effective.  If you are 45-79 years old, ask your health care provider if   you should take aspirin to prevent heart disease.  Diabetes screening involves taking a blood sample to check your fasting blood sugar level. This should be done once every 3 years after age 45 if you are at a normal weight and without risk factors for diabetes. Testing should be considered at a younger age or be carried out more frequently if you are overweight and have at least 1 risk factor for diabetes.  Colorectal cancer can be detected and often prevented. Most routine colorectal cancer screening begins at the age of 50  and continues through age 75. However, your health care provider may recommend screening at an earlier age if you have risk factors for colon cancer. On a yearly basis, your health care provider may provide home test kits to check for hidden blood in the stool. A small camera at the end of a tube may be used to directly examine the colon (sigmoidoscopy or colonoscopy) to detect the earliest forms of colorectal cancer. Talk to your health care provider about this at age 50 when routine screening begins. A direct exam of the colon should be repeated every 5-10 years through age 75, unless early forms of precancerous polyps or small growths are found.  People who are at an increased risk for hepatitis B should be screened for this virus. You are considered at high risk for hepatitis B if:  You were born in a country where hepatitis B occurs often. Talk with your health care provider about which countries are considered high risk.  Your parents were born in a high-risk country and you have not received a shot to protect against hepatitis B (hepatitis B vaccine).  You have HIV or AIDS.  You use needles to inject street drugs.  You live with, or have sex with, someone who has hepatitis B.  You are a man who has sex with other men (MSM).  You get hemodialysis treatment.  You take certain medicines for conditions like cancer, organ transplantation, and autoimmune conditions.  Hepatitis C blood testing is recommended for all people born from 1945 through 1965 and any individual with known risk factors for hepatitis C.  Healthy men should no longer receive prostate-specific antigen (PSA) blood tests as part of routine cancer screening. Talk to your health care provider about prostate cancer screening.  Testicular cancer screening is not recommended for adolescents or adult males who have no symptoms. Screening includes self-exam, a health care provider exam, and other screening tests. Consult with your  health care provider about any symptoms you have or any concerns you have about testicular cancer.  Practice safe sex. Use condoms and avoid high-risk sexual practices to reduce the spread of sexually transmitted infections (STIs).  You should be screened for STIs, including gonorrhea and chlamydia if:  You are sexually active and are younger than 24 years.  You are older than 24 years, and your health care provider tells you that you are at risk for this type of infection.  Your sexual activity has changed since you were last screened, and you are at an increased risk for chlamydia or gonorrhea. Ask your health care provider if you are at risk.  If you are at risk of being infected with HIV, it is recommended that you take a prescription medicine daily to prevent HIV infection. This is called pre-exposure prophylaxis (PrEP). You are considered at risk if:  You are a man who has sex with other men (MSM).  You are a heterosexual man who   is sexually active with multiple partners.  You take drugs by injection.  You are sexually active with a partner who has HIV.  Talk with your health care provider about whether you are at high risk of being infected with HIV. If you choose to begin PrEP, you should first be tested for HIV. You should then be tested every 3 months for as long as you are taking PrEP.  Use sunscreen. Apply sunscreen liberally and repeatedly throughout the day. You should seek shade when your shadow is shorter than you. Protect yourself by wearing long sleeves, pants, a wide-brimmed hat, and sunglasses year round whenever you are outdoors.  Tell your health care provider of new moles or changes in moles, especially if there is a change in shape or color. Also, tell your health care provider if a mole is larger than the size of a pencil eraser.  A one-time screening for abdominal aortic aneurysm (AAA) and surgical repair of large AAAs by ultrasound is recommended for men aged  59-75 years who are current or former smokers.  Stay current with your vaccines (immunizations). Document Released: 08/28/2007 Document Revised: 03/06/2013 Document Reviewed: 07/27/2010 [facility] Patient Information 2015 [place], [place]. This information is not intended to replace advice given to you by your health care provider. Make sure you discuss any questions you have with your health care provider. Hypothyroidism The thyroid is a large gland located in the lower front of your neck. The thyroid gland helps control metabolism. Metabolism is how your body handles food. It controls metabolism with the hormone thyroxine. When this gland is underactive (hypothyroid), it produces too little hormone.  CAUSES These include:   Absence or destruction of thyroid tissue.  Goiter due to iodine deficiency.  Goiter due to medications.  Congenital defects (since birth).  Problems with the pituitary. This causes a lack of TSH (thyroid stimulating hormone). This hormone tells the thyroid to turn out more hormone. SYMPTOMS  Lethargy (feeling as though you have no energy)  Cold intolerance  Weight gain (in spite of normal food intake)  Dry skin  Coarse hair  Menstrual irregularity (if severe, may lead to infertility)  Slowing of thought processes Cardiac problems are also caused by insufficient amounts of thyroid hormone. Hypothyroidism in the newborn is cretinism, and is an extreme form. It is important that this form be treated adequately and immediately or it will lead rapidly to retarded physical and mental development. DIAGNOSIS  To prove hypothyroidism, your caregiver may do blood tests and ultrasound tests. Sometimes the signs are hidden. It may be necessary for your caregiver to watch this illness with blood tests either before or after diagnosis and treatment. TREATMENT  Low levels of thyroid hormone are increased by using synthetic thyroid hormone. This is a safe, effective  treatment. It usually takes about four weeks to gain the full effects of the medication. After you have the full effect of the medication, it will generally take another four weeks for problems to leave. Your caregiver may start you on low doses. If you have had heart problems the dose may be gradually increased. It is generally not an emergency to get rapidly to normal. HOME CARE INSTRUCTIONS   Take your medications as your caregiver suggests. Let your caregiver know of any medications you are taking or start taking. Your caregiver will help you with dosage schedules.  As your condition improves, your dosage needs may increase. It will be necessary to have continuing blood tests as suggested by your  caregiver.  Report all suspected medication side effects to your caregiver. SEEK MEDICAL CARE IF: Seek medical care if you develop:  Sweating.  Tremulousness (tremors).  Anxiety.  Rapid weight loss.  Heat intolerance.  Emotional swings.  Diarrhea.  Weakness. SEEK IMMEDIATE MEDICAL CARE IF:  You develop chest pain, an irregular heart beat (palpitations), or a rapid heart beat. MAKE SURE YOU:   Understand these instructions.  Will watch your condition.  Will get help right away if you are not doing well or get worse. Document Released: 03/01/2005 Document Revised: 05/24/2011 Document Reviewed: 10/20/2007 [facility] Patient Information 2015 [place], [place]. This information is not intended to replace advice given to you by your health care provider. Make sure you discuss any questions you have with your health care provider.

## 2013-12-28 ENCOUNTER — Encounter (HOSPITAL_COMMUNITY)
Admission: RE | Admit: 2013-12-28 | Discharge: 2013-12-28 | Disposition: A | Discharge status: Home / Self Care | Payer: BC Managed Care – PPO | Source: Ambulatory Visit | Attending: Cardiovascular Disease | Admitting: Cardiovascular Disease

## 2013-12-28 DIAGNOSIS — I214 Non-ST elevation (NSTEMI) myocardial infarction: Secondary | ICD-10-CM | POA: Diagnosis not present

## 2013-12-30 NOTE — Assessment & Plan Note (Signed)
Exam done Vaccines were reviewed and updated Labs ordered Pt ed material was given 

## 2013-12-30 NOTE — Assessment & Plan Note (Signed)
He has achieved his LDL goal 

## 2013-12-30 NOTE — Assessment & Plan Note (Signed)
His TSH is suppressed so I have lowered his dose

## 2013-12-30 NOTE — Assessment & Plan Note (Signed)
Renal function is stable.

## 2013-12-30 NOTE — Assessment & Plan Note (Signed)
Blood sugars are well controlled.

## 2013-12-31 ENCOUNTER — Telehealth: Payer: Self-pay | Admitting: Internal Medicine

## 2013-12-31 ENCOUNTER — Encounter (HOSPITAL_COMMUNITY)
Admission: RE | Admit: 2013-12-31 | Discharge: 2013-12-31 | Disposition: A | Discharge status: Home / Self Care | Payer: BC Managed Care – PPO | Source: Ambulatory Visit | Attending: Cardiovascular Disease | Admitting: Cardiovascular Disease

## 2013-12-31 DIAGNOSIS — I214 Non-ST elevation (NSTEMI) myocardial infarction: Secondary | ICD-10-CM | POA: Diagnosis not present

## 2013-12-31 LAB — HM DIABETES EYE EXAM

## 2013-12-31 NOTE — Telephone Encounter (Signed)
error 

## 2014-01-01 ENCOUNTER — Other Ambulatory Visit (INDEPENDENT_AMBULATORY_CARE_PROVIDER_SITE_OTHER): Payer: BC Managed Care – PPO

## 2014-01-01 DIAGNOSIS — E785 Hyperlipidemia, unspecified: Secondary | ICD-10-CM

## 2014-01-02 ENCOUNTER — Encounter (HOSPITAL_COMMUNITY)
Admission: RE | Admit: 2014-01-02 | Discharge: 2014-01-02 | Disposition: A | Discharge status: Home / Self Care | Payer: BC Managed Care – PPO | Source: Ambulatory Visit | Attending: Cardiovascular Disease | Admitting: Cardiovascular Disease

## 2014-01-02 ENCOUNTER — Other Ambulatory Visit: Payer: BC Managed Care – PPO | Admitting: *Deleted

## 2014-01-02 DIAGNOSIS — I214 Non-ST elevation (NSTEMI) myocardial infarction: Secondary | ICD-10-CM | POA: Diagnosis not present

## 2014-01-02 LAB — HEPATIC FUNCTION PANEL
ALBUMIN: 3.6 g/dL (ref 3.5–5.2)
ALT: 13 U/L (ref 0–53)
AST: 20 U/L (ref 0–37)
Alkaline Phosphatase: 54 U/L (ref 39–117)
BILIRUBIN DIRECT: 0 mg/dL (ref 0.0–0.3)
TOTAL PROTEIN: 7.3 g/dL (ref 6.0–8.3)
Total Bilirubin: 0.8 mg/dL (ref 0.2–1.2)

## 2014-01-02 LAB — LIPID PANEL
CHOL/HDL RATIO: 3
CHOLESTEROL: 144 mg/dL (ref 0–200)
HDL: 41.9 mg/dL (ref >39.00)
LDL Cholesterol: 90 mg/dL (ref 0–99)
NonHDL: 102.1
TRIGLYCERIDES: 63 mg/dL (ref 0.0–149.0)
VLDL: 12.6 mg/dL (ref 0.0–40.0)

## 2014-01-03 ENCOUNTER — Telehealth: Payer: Self-pay | Admitting: Cardiovascular Disease

## 2014-01-03 DIAGNOSIS — E785 Hyperlipidemia, unspecified: Secondary | ICD-10-CM

## 2014-01-03 MED ORDER — ATORVASTATIN CALCIUM 40 MG PO TABS: 40.0000 mg | ORAL_TABLET | Freq: Every day | ORAL | Status: DC

## 2014-01-03 NOTE — Addendum Note (Signed)
Addended by: Thompson Grayer on: 01/03/2014 04:22 PM   Modules accepted: Orders

## 2014-01-03 NOTE — Telephone Encounter (Signed)
Follow up ° °Pt returned call  °

## 2014-01-03 NOTE — Telephone Encounter (Signed)
Spoke with pt and reviewed lipid and liver results with him and recommendations from Dr. Angelena Form. He will increase atorvastatin to 40 mg daily and come in for fasting lab work on March 25, 2014. Will send new prescription to [facility] on [address].

## 2014-01-04 ENCOUNTER — Encounter (HOSPITAL_COMMUNITY)
Admission: RE | Admit: 2014-01-04 | Discharge: 2014-01-04 | Disposition: A | Discharge status: Home / Self Care | Payer: BC Managed Care – PPO | Source: Ambulatory Visit | Attending: Cardiovascular Disease | Admitting: Cardiovascular Disease

## 2014-01-04 DIAGNOSIS — I214 Non-ST elevation (NSTEMI) myocardial infarction: Secondary | ICD-10-CM | POA: Diagnosis not present

## 2014-01-07 ENCOUNTER — Encounter (HOSPITAL_COMMUNITY)
Admission: RE | Admit: 2014-01-07 | Discharge: 2014-01-07 | Disposition: A | Discharge status: Home / Self Care | Payer: BC Managed Care – PPO | Source: Ambulatory Visit | Attending: Cardiovascular Disease | Admitting: Cardiovascular Disease

## 2014-01-07 ENCOUNTER — Ambulatory Visit: Payer: No Typology Code available for payment source | Admitting: Internal Medicine

## 2014-01-07 DIAGNOSIS — I214 Non-ST elevation (NSTEMI) myocardial infarction: Secondary | ICD-10-CM | POA: Diagnosis not present

## 2014-01-09 ENCOUNTER — Ambulatory Visit (INDEPENDENT_AMBULATORY_CARE_PROVIDER_SITE_OTHER): Payer: BC Managed Care – PPO | Admitting: Internal Medicine

## 2014-01-09 ENCOUNTER — Encounter (HOSPITAL_COMMUNITY)
Admission: RE | Admit: 2014-01-09 | Discharge: 2014-01-09 | Disposition: A | Discharge status: Home / Self Care | Payer: BC Managed Care – PPO | Source: Ambulatory Visit | Attending: Cardiovascular Disease | Admitting: Cardiovascular Disease

## 2014-01-09 ENCOUNTER — Encounter: Payer: Self-pay | Admitting: Internal Medicine

## 2014-01-09 VITALS — BP 122/80 | HR 66 | Temp 97.8°F | Resp 16 | Ht 70.0 in | Wt 246.0 lb

## 2014-01-09 DIAGNOSIS — E890 Postprocedural hypothyroidism: Secondary | ICD-10-CM

## 2014-01-09 DIAGNOSIS — I214 Non-ST elevation (NSTEMI) myocardial infarction: Secondary | ICD-10-CM | POA: Diagnosis not present

## 2014-01-09 DIAGNOSIS — Z23 Encounter for immunization: Secondary | ICD-10-CM

## 2014-01-09 DIAGNOSIS — E1121 Type 2 diabetes mellitus with diabetic nephropathy: Secondary | ICD-10-CM

## 2014-01-09 NOTE — Patient Instructions (Signed)

## 2014-01-09 NOTE — Progress Notes (Signed)
Pre visit review using our clinic review tool, if applicable. No additional management support is needed unless otherwise documented below in the visit note. 

## 2014-01-09 NOTE — Progress Notes (Addendum)
   Subjective:    Patient ID: Devon Brady, male    DOB: 22-Jan-1951, 64 y.o.   MRN: [id number]  Thyroid Problem Presents for follow-up visit. Patient reports no anxiety, cold intolerance, constipation, depressed mood, diaphoresis, diarrhea, dry skin, fatigue, hair loss, heat intolerance, hoarse voice, leg swelling, menstrual problem, nail problem, palpitations, tremors, visual change, weight gain or weight loss. The symptoms have been resolved. The treatment provided no relief.      Review of Systems  Constitutional: Negative.  Negative for chills, weight loss, weight gain, diaphoresis and fatigue.  HENT: Negative.  Negative for hoarse voice.   Eyes: Negative.   Respiratory: Negative.  Negative for cough, choking, chest tightness, shortness of breath and stridor.   Cardiovascular: Negative.  Negative for chest pain, palpitations and leg swelling.  Gastrointestinal: Negative.  Negative for nausea, abdominal pain, diarrhea, constipation and blood in stool.  Endocrine: Negative.  Negative for cold intolerance and heat intolerance.  Genitourinary: Negative.  Negative for menstrual problem.  Musculoskeletal: Negative.  Negative for arthralgias, back pain, joint swelling and myalgias.  Skin: Negative for rash.  Allergic/Immunologic: Negative.   Neurological: Negative.  Negative for tremors.  Hematological: Negative.  Negative for adenopathy. Does not bruise/bleed easily.  Psychiatric/Behavioral: Negative.        Objective:   Physical Exam  Constitutional: He is oriented to person, place, and time. He appears well-developed and well-nourished. No distress.  HENT:  Mouth/Throat: Oropharynx is clear and moist. No oropharyngeal exudate.  Eyes: Conjunctivae are normal. Right eye exhibits no discharge. Left eye exhibits no discharge. No scleral icterus.  Neck: Normal range of motion. Neck supple. No JVD present. No tracheal deviation present. No thyromegaly present.  Cardiovascular:  Normal rate, regular rhythm, normal heart sounds and intact distal pulses.  Exam reveals no gallop and no friction rub.   No murmur heard. Pulmonary/Chest: Effort normal and breath sounds normal. No stridor. No respiratory distress. He has no wheezes. He has no rales. He exhibits no tenderness.  Abdominal: Soft. Bowel sounds are normal. He exhibits no distension and no mass. There is no tenderness. There is no rebound and no guarding.  Musculoskeletal: Normal range of motion. He exhibits no edema or tenderness.  Lymphadenopathy:    He has no cervical adenopathy.  Neurological: He is oriented to person, place, and time.  Skin: Skin is warm and dry. No rash noted. He is not diaphoretic. No erythema. No pallor.  Vitals reviewed.   Lab Results  Component Value Date   WBC 5.6 12/27/2013   HGB 15.6 12/27/2013   HCT 46.7 12/27/2013   PLT 221.0 12/27/2013   GLUCOSE 86 12/27/2013   CHOL 144 01/01/2014   TRIG 63.0 01/01/2014   HDL 41.90 01/01/2014   LDLCALC 90 01/01/2014   ALT 13 01/01/2014   AST 20 01/01/2014   NA 141 12/27/2013   K 4.7 12/27/2013   CL 107 12/27/2013   CREATININE 1.6* 12/27/2013   BUN 26* 12/27/2013   CO2 28 12/27/2013   TSH 0.03* 12/27/2013   PSA 1.46 12/27/2013   INR 0.97 10/02/2013   HGBA1C 6.2 12/27/2013        Assessment & Plan:

## 2014-01-11 ENCOUNTER — Encounter: Payer: Self-pay | Admitting: Internal Medicine

## 2014-01-11 ENCOUNTER — Encounter (HOSPITAL_COMMUNITY)
Admission: RE | Admit: 2014-01-11 | Discharge: 2014-01-11 | Disposition: A | Discharge status: Home / Self Care | Payer: BC Managed Care – PPO | Source: Ambulatory Visit | Attending: Cardiovascular Disease | Admitting: Cardiovascular Disease

## 2014-01-11 DIAGNOSIS — I214 Non-ST elevation (NSTEMI) myocardial infarction: Secondary | ICD-10-CM | POA: Diagnosis not present

## 2014-01-11 NOTE — Assessment & Plan Note (Signed)
His TSH is in the normal range He will stay on the current dose 

## 2014-01-11 NOTE — Assessment & Plan Note (Signed)
His blood sugars are well controlled His renal function has declined some, he tells me that he sees renal tomorroa

## 2014-01-14 ENCOUNTER — Encounter (HOSPITAL_COMMUNITY)
Admission: RE | Admit: 2014-01-14 | Discharge: 2014-01-14 | Disposition: A | Discharge status: Home / Self Care | Payer: BC Managed Care – PPO | Source: Ambulatory Visit | Attending: Cardiovascular Disease | Admitting: Cardiovascular Disease

## 2014-01-14 DIAGNOSIS — I214 Non-ST elevation (NSTEMI) myocardial infarction: Secondary | ICD-10-CM | POA: Diagnosis present

## 2014-01-16 ENCOUNTER — Encounter (HOSPITAL_COMMUNITY)
Admission: RE | Admit: 2014-01-16 | Discharge: 2014-01-16 | Disposition: A | Discharge status: Home / Self Care | Payer: BC Managed Care – PPO | Source: Ambulatory Visit | Attending: Cardiovascular Disease | Admitting: Cardiovascular Disease

## 2014-01-16 DIAGNOSIS — I214 Non-ST elevation (NSTEMI) myocardial infarction: Secondary | ICD-10-CM | POA: Diagnosis not present

## 2014-01-18 ENCOUNTER — Encounter (HOSPITAL_COMMUNITY)
Admission: RE | Admit: 2014-01-18 | Discharge: 2014-01-18 | Disposition: A | Discharge status: Home / Self Care | Payer: BC Managed Care – PPO | Source: Ambulatory Visit | Attending: Cardiovascular Disease | Admitting: Cardiovascular Disease

## 2014-01-18 DIAGNOSIS — I214 Non-ST elevation (NSTEMI) myocardial infarction: Secondary | ICD-10-CM | POA: Diagnosis not present

## 2014-01-21 ENCOUNTER — Encounter (HOSPITAL_COMMUNITY)
Admission: RE | Admit: 2014-01-21 | Discharge: 2014-01-21 | Disposition: A | Discharge status: Home / Self Care | Payer: BC Managed Care – PPO | Source: Ambulatory Visit | Attending: Cardiovascular Disease | Admitting: Cardiovascular Disease

## 2014-01-21 DIAGNOSIS — I214 Non-ST elevation (NSTEMI) myocardial infarction: Secondary | ICD-10-CM | POA: Diagnosis not present

## 2014-01-23 ENCOUNTER — Encounter (HOSPITAL_COMMUNITY)
Admission: RE | Admit: 2014-01-23 | Discharge: 2014-01-23 | Disposition: A | Discharge status: Home / Self Care | Payer: BC Managed Care – PPO | Source: Ambulatory Visit | Attending: Cardiovascular Disease | Admitting: Cardiovascular Disease

## 2014-01-23 DIAGNOSIS — I214 Non-ST elevation (NSTEMI) myocardial infarction: Secondary | ICD-10-CM | POA: Diagnosis not present

## 2014-01-25 ENCOUNTER — Encounter (HOSPITAL_COMMUNITY)
Admission: RE | Admit: 2014-01-25 | Discharge: 2014-01-25 | Disposition: A | Discharge status: Home / Self Care | Payer: BC Managed Care – PPO | Source: Ambulatory Visit | Attending: Cardiovascular Disease | Admitting: Cardiovascular Disease

## 2014-01-25 DIAGNOSIS — I214 Non-ST elevation (NSTEMI) myocardial infarction: Secondary | ICD-10-CM | POA: Diagnosis not present

## 2014-01-28 ENCOUNTER — Encounter (HOSPITAL_COMMUNITY)
Admission: RE | Admit: 2014-01-28 | Discharge: 2014-01-28 | Disposition: A | Discharge status: Home / Self Care | Payer: BC Managed Care – PPO | Source: Ambulatory Visit | Attending: Cardiovascular Disease | Admitting: Cardiovascular Disease

## 2014-01-28 DIAGNOSIS — I214 Non-ST elevation (NSTEMI) myocardial infarction: Secondary | ICD-10-CM | POA: Diagnosis not present

## 2014-01-30 ENCOUNTER — Encounter (HOSPITAL_COMMUNITY)
Admission: RE | Admit: 2014-01-30 | Discharge: 2014-01-30 | Disposition: A | Discharge status: Home / Self Care | Payer: BC Managed Care – PPO | Source: Ambulatory Visit | Attending: Cardiovascular Disease | Admitting: Cardiovascular Disease

## 2014-01-30 DIAGNOSIS — I214 Non-ST elevation (NSTEMI) myocardial infarction: Secondary | ICD-10-CM | POA: Diagnosis not present

## 2014-02-01 ENCOUNTER — Encounter (HOSPITAL_COMMUNITY)
Admission: RE | Admit: 2014-02-01 | Discharge: 2014-02-01 | Disposition: A | Discharge status: Home / Self Care | Payer: BC Managed Care – PPO | Source: Ambulatory Visit | Attending: Cardiovascular Disease | Admitting: Cardiovascular Disease

## 2014-02-01 DIAGNOSIS — I214 Non-ST elevation (NSTEMI) myocardial infarction: Secondary | ICD-10-CM | POA: Diagnosis not present

## 2014-02-04 ENCOUNTER — Encounter (HOSPITAL_COMMUNITY)
Admission: RE | Admit: 2014-02-04 | Discharge: 2014-02-04 | Disposition: A | Discharge status: Home / Self Care | Payer: BC Managed Care – PPO | Source: Ambulatory Visit | Attending: Cardiovascular Disease | Admitting: Cardiovascular Disease

## 2014-02-04 DIAGNOSIS — I214 Non-ST elevation (NSTEMI) myocardial infarction: Secondary | ICD-10-CM | POA: Diagnosis not present

## 2014-02-06 ENCOUNTER — Encounter (HOSPITAL_COMMUNITY)
Admission: RE | Admit: 2014-02-06 | Discharge: 2014-02-06 | Disposition: A | Discharge status: Home / Self Care | Payer: BC Managed Care – PPO | Source: Ambulatory Visit | Attending: Cardiovascular Disease | Admitting: Cardiovascular Disease

## 2014-02-06 DIAGNOSIS — I214 Non-ST elevation (NSTEMI) myocardial infarction: Secondary | ICD-10-CM | POA: Diagnosis not present

## 2014-02-11 ENCOUNTER — Encounter (HOSPITAL_COMMUNITY)
Admission: RE | Admit: 2014-02-11 | Discharge: 2014-02-11 | Disposition: A | Discharge status: Home / Self Care | Payer: BC Managed Care – PPO | Source: Ambulatory Visit | Attending: Cardiovascular Disease | Admitting: Cardiovascular Disease

## 2014-02-11 DIAGNOSIS — I214 Non-ST elevation (NSTEMI) myocardial infarction: Secondary | ICD-10-CM | POA: Diagnosis not present

## 2014-02-13 ENCOUNTER — Encounter (HOSPITAL_COMMUNITY)
Admission: RE | Admit: 2014-02-13 | Discharge: 2014-02-13 | Disposition: A | Discharge status: Home / Self Care | Payer: BC Managed Care – PPO | Source: Ambulatory Visit | Attending: Cardiovascular Disease | Admitting: Cardiovascular Disease

## 2014-02-13 ENCOUNTER — Encounter: Payer: Self-pay | Admitting: Internal Medicine

## 2014-02-13 ENCOUNTER — Ambulatory Visit (INDEPENDENT_AMBULATORY_CARE_PROVIDER_SITE_OTHER): Payer: BC Managed Care – PPO | Admitting: Internal Medicine

## 2014-02-13 ENCOUNTER — Other Ambulatory Visit (INDEPENDENT_AMBULATORY_CARE_PROVIDER_SITE_OTHER): Payer: BC Managed Care – PPO

## 2014-02-13 VITALS — BP 112/80 | HR 66 | Temp 98.0°F | Resp 16 | Ht 70.0 in | Wt 244.0 lb

## 2014-02-13 DIAGNOSIS — N289 Disorder of kidney and ureter, unspecified: Secondary | ICD-10-CM

## 2014-02-13 DIAGNOSIS — I214 Non-ST elevation (NSTEMI) myocardial infarction: Secondary | ICD-10-CM | POA: Diagnosis not present

## 2014-02-13 DIAGNOSIS — E1121 Type 2 diabetes mellitus with diabetic nephropathy: Secondary | ICD-10-CM

## 2014-02-13 DIAGNOSIS — E890 Postprocedural hypothyroidism: Secondary | ICD-10-CM

## 2014-02-13 DIAGNOSIS — Z23 Encounter for immunization: Secondary | ICD-10-CM

## 2014-02-13 LAB — URINALYSIS, ROUTINE W REFLEX MICROSCOPIC
Bilirubin Urine: NEGATIVE
Hgb urine dipstick: NEGATIVE
KETONES UR: NEGATIVE
Leukocytes, UA: NEGATIVE
Nitrite: NEGATIVE
PH: 6 (ref 5.0–8.0)
RBC / HPF: NONE SEEN (ref 0–?)
Specific Gravity, Urine: 1.02 (ref 1.000–1.030)
Total Protein, Urine: NEGATIVE
UROBILINOGEN UA: 0.2 (ref 0.0–1.0)
Urine Glucose: NEGATIVE
WBC, UA: NONE SEEN (ref 0–?)

## 2014-02-13 LAB — BASIC METABOLIC PANEL
BUN: 22 mg/dL (ref 6–23)
CHLORIDE: 105 meq/L (ref 96–112)
CO2: 24 mEq/L (ref 19–32)
CREATININE: 1.5 mg/dL (ref 0.4–1.5)
Calcium: 9.6 mg/dL (ref 8.4–10.5)
GFR: 60.46 mL/min (ref >60.00)
Glucose, Bld: 92 mg/dL (ref 70–99)
POTASSIUM: 4.6 meq/L (ref 3.5–5.1)
Sodium: 138 mEq/L (ref 135–145)

## 2014-02-13 LAB — MICROALBUMIN / CREATININE URINE RATIO
CREATININE, U: 171.3 mg/dL
MICROALB UR: 3.9 mg/dL — AB (ref 0.0–1.9)
Microalb Creat Ratio: 2.3 mg/g (ref 0.0–30.0)

## 2014-02-13 LAB — LIPID PANEL
CHOL/HDL RATIO: 4
Cholesterol: 146 mg/dL (ref 0–200)
HDL: 36.6 mg/dL — ABNORMAL LOW (ref >39.00)
LDL CALC: 99 mg/dL (ref 0–99)
NONHDL: 109.4
Triglycerides: 53 mg/dL (ref 0.0–149.0)
VLDL: 10.6 mg/dL (ref 0.0–40.0)

## 2014-02-13 LAB — TSH: TSH: 0.05 u[IU]/mL — AB (ref 0.35–4.50)

## 2014-02-13 NOTE — Patient Instructions (Signed)
Hypothyroidism The thyroid is a large gland located in the lower front of your neck. The thyroid gland helps control metabolism. Metabolism is how your body handles food. It controls metabolism with the hormone thyroxine. When this gland is underactive (hypothyroid), it produces too little hormone.  CAUSES These include:   Absence or destruction of thyroid tissue.  Goiter due to iodine deficiency.  Goiter due to medications.  Congenital defects (since birth).  Problems with the pituitary. This causes a lack of TSH (thyroid stimulating hormone). This hormone tells the thyroid to turn out more hormone. SYMPTOMS  Lethargy (feeling as though you have no energy)  Cold intolerance  Weight gain (in spite of normal food intake)  Dry skin  Coarse hair  Menstrual irregularity (if severe, may lead to infertility)  Slowing of thought processes Cardiac problems are also caused by insufficient amounts of thyroid hormone. Hypothyroidism in the newborn is cretinism, and is an extreme form. It is important that this form be treated adequately and immediately or it will lead rapidly to retarded physical and mental development. DIAGNOSIS  To prove hypothyroidism, your caregiver may do blood tests and ultrasound tests. Sometimes the signs are hidden. It may be necessary for your caregiver to watch this illness with blood tests either before or after diagnosis and treatment. TREATMENT  Low levels of thyroid hormone are increased by using synthetic thyroid hormone. This is a safe, effective treatment. It usually takes about four weeks to gain the full effects of the medication. After you have the full effect of the medication, it will generally take another four weeks for problems to leave. Your caregiver may start you on low doses. If you have had heart problems the dose may be gradually increased. It is generally not an emergency to get rapidly to normal. HOME CARE INSTRUCTIONS   Take your  medications as your caregiver suggests. Let your caregiver know of any medications you are taking or start taking. Your caregiver will help you with dosage schedules.  As your condition improves, your dosage needs may increase. It will be necessary to have continuing blood tests as suggested by your caregiver.  Report all suspected medication side effects to your caregiver. SEEK MEDICAL CARE IF: Seek medical care if you develop:  Sweating.  Tremulousness (tremors).  Anxiety.  Rapid weight loss.  Heat intolerance.  Emotional swings.  Diarrhea.  Weakness. SEEK IMMEDIATE MEDICAL CARE IF:  You develop chest pain, an irregular heart beat (palpitations), or a rapid heart beat. MAKE SURE YOU:   Understand these instructions.  Will watch your condition.  Will get help right away if you are not doing well or get worse. Document Released: 03/01/2005 Document Revised: 05/24/2011 Document Reviewed: 10/20/2007 Exit[employer] Patient Information 2015 [employer]. This information is not intended to replace advice given to you by your health care provider. Make sure you discuss any questions you have with your health care provider.  

## 2014-02-13 NOTE — Addendum Note (Signed)
Addended by: Janith Lima on: 02/13/2014 09:28 AM   Modules accepted: Miquel Dunn

## 2014-02-13 NOTE — Progress Notes (Signed)
Pre visit review using our clinic review tool, if applicable. No additional management support is needed unless otherwise documented below in the visit note. 

## 2014-02-14 ENCOUNTER — Encounter: Payer: Self-pay | Admitting: Internal Medicine

## 2014-02-14 MED ORDER — LEVOTHYROXINE SODIUM 175 MCG PO TABS: 175.0000 ug | ORAL_TABLET | Freq: Every day | ORAL | Status: DC

## 2014-02-14 NOTE — Assessment & Plan Note (Signed)
His TSH is still suppressed so I will lower the synthroid dose

## 2014-02-14 NOTE — Assessment & Plan Note (Signed)
His blood sugars are adequately well controlled His urine A/C ratio is normal

## 2014-02-14 NOTE — Progress Notes (Signed)
   Subjective:    Patient ID: Devon Brady, male    DOB: [birth year], 63 y.o.   MRN: [id number]  Thyroid Problem Presents for follow-up visit. Patient reports no anxiety, cold intolerance, constipation, depressed mood, diaphoresis, diarrhea, dry skin, fatigue, hair loss, heat intolerance, hoarse voice, leg swelling, nail problem, palpitations, tremors, visual change, weight gain or weight loss. The symptoms have been stable. Past treatments include levothyroxine. Prior procedures include thyroidectomy.      Review of Systems  Constitutional: Negative.  Negative for fever, chills, weight loss, weight gain, diaphoresis, appetite change and fatigue.  HENT: Negative.  Negative for hoarse voice.   Eyes: Negative.   Respiratory: Negative.  Negative for cough, choking, chest tightness, shortness of breath and stridor.   Cardiovascular: Negative.  Negative for chest pain, palpitations and leg swelling.  Gastrointestinal: Negative.  Negative for nausea, abdominal pain, diarrhea, constipation, blood in stool and anal bleeding.  Endocrine: Negative.  Negative for cold intolerance and heat intolerance.  Genitourinary: Negative.   Musculoskeletal: Negative.   Skin: Negative.  Negative for rash.  Allergic/Immunologic: Negative.   Neurological: Negative.  Negative for tremors.  Hematological: Negative.  Negative for adenopathy. Does not bruise/bleed easily.  Psychiatric/Behavioral: Negative.  The patient is not nervous/anxious.        Objective:   Physical Exam  Constitutional: He is oriented to person, place, and time. He appears well-developed and well-nourished. No distress.  HENT:  Head: Normocephalic and atraumatic.  Mouth/Throat: Oropharynx is clear and moist. No oropharyngeal exudate.  Eyes: Conjunctivae are normal. Right eye exhibits no discharge. Left eye exhibits no discharge. No scleral icterus.  Neck: Normal range of motion. Neck supple. No JVD present. No tracheal deviation  present. No thyromegaly present.  Cardiovascular: Normal rate, regular rhythm, normal heart sounds and intact distal pulses.  Exam reveals no gallop and no friction rub.   No murmur heard. Pulmonary/Chest: Effort normal and breath sounds normal. No stridor. No respiratory distress. He has no wheezes. He has no rales. He exhibits no tenderness.  Abdominal: Soft. Bowel sounds are normal. He exhibits no distension and no mass. There is no tenderness. There is no rebound and no guarding.  Musculoskeletal: Normal range of motion. He exhibits no edema or tenderness.  Lymphadenopathy:    He has no cervical adenopathy.  Neurological: He is oriented to person, place, and time.  Skin: Skin is warm and dry. No rash noted. He is not diaphoretic. No erythema. No pallor.  Vitals reviewed.   Lab Results  Component Value Date   WBC 5.6 12/27/2013   HGB 15.6 12/27/2013   HCT 46.7 12/27/2013   PLT 221.0 12/27/2013   GLUCOSE 92 02/13/2014   CHOL 146 02/13/2014   TRIG 53.0 02/13/2014   HDL 36.60* 02/13/2014   LDLCALC 99 02/13/2014   ALT 13 01/01/2014   AST 20 01/01/2014   NA 138 02/13/2014   K 4.6 02/13/2014   CL 105 02/13/2014   CREATININE 1.5 02/13/2014   BUN 22 02/13/2014   CO2 24 02/13/2014   TSH 0.05* 02/13/2014   PSA 1.46 12/27/2013   INR 0.97 10/02/2013   HGBA1C 6.2 12/27/2013   MICROALBUR 3.9* 02/13/2014        Assessment & Plan:

## 2014-02-15 ENCOUNTER — Encounter (HOSPITAL_COMMUNITY)
Admission: RE | Admit: 2014-02-15 | Discharge: 2014-02-15 | Disposition: A | Discharge status: Home / Self Care | Payer: BC Managed Care – PPO | Source: Ambulatory Visit | Attending: Cardiovascular Disease | Admitting: Cardiovascular Disease

## 2014-02-15 ENCOUNTER — Encounter (HOSPITAL_COMMUNITY): Payer: BC Managed Care – PPO

## 2014-02-18 ENCOUNTER — Encounter (HOSPITAL_COMMUNITY)
Admission: RE | Admit: 2014-02-18 | Discharge: 2014-02-18 | Disposition: A | Discharge status: Home / Self Care | Payer: BC Managed Care – PPO | Source: Ambulatory Visit | Attending: Cardiovascular Disease | Admitting: Cardiovascular Disease

## 2014-02-18 DIAGNOSIS — I214 Non-ST elevation (NSTEMI) myocardial infarction: Secondary | ICD-10-CM | POA: Diagnosis not present

## 2014-02-20 ENCOUNTER — Encounter (HOSPITAL_COMMUNITY)
Admission: RE | Admit: 2014-02-20 | Discharge: 2014-02-20 | Disposition: A | Discharge status: Home / Self Care | Payer: BC Managed Care – PPO | Source: Ambulatory Visit | Attending: Cardiovascular Disease | Admitting: Cardiovascular Disease

## 2014-02-20 DIAGNOSIS — I214 Non-ST elevation (NSTEMI) myocardial infarction: Secondary | ICD-10-CM | POA: Diagnosis not present

## 2014-02-20 NOTE — Progress Notes (Signed)
Pt graduates from the cardiac rehab phase II 6:45 class with 36 exercise sessions completed. Pt maintained excellent attendance to exercise and education classes.  Pt improved his MET level from 3.3 to 4.8.  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  Medication list reconciled. Pt verbalizes compliance with medical therapy.  Pt met his short term goal to increase stamina.  This is pt's second participation in cardiac rehab and he feels he is in a "better physical" state than previously.  Pt also had goal to decrease weight. During his participation pt weight decreased from 114kg to 110.8kg.  Pt feels he has the tools to continue with weight loss.  Pt desired to get back into an exercise routine.  Pt feels he has accomplished this goal and plans to continue to exercise at the same time to continue the continuity. Repeat PHQ2 score 0.  Pt shows appropriate healthy coping skills and displays a positive outlook on life.  It was a pleasure to work with this pt in cardiac rehab.  Cherre Huger, BSN

## 2014-02-21 ENCOUNTER — Encounter (HOSPITAL_COMMUNITY): Payer: Self-pay | Admitting: Interventional Cardiology

## 2014-02-22 ENCOUNTER — Encounter (HOSPITAL_COMMUNITY): Payer: BC Managed Care – PPO

## 2014-02-25 ENCOUNTER — Encounter (HOSPITAL_COMMUNITY): Payer: BC Managed Care – PPO

## 2014-02-27 ENCOUNTER — Encounter (HOSPITAL_COMMUNITY): Payer: BC Managed Care – PPO

## 2014-03-01 ENCOUNTER — Encounter (HOSPITAL_COMMUNITY): Payer: BC Managed Care – PPO

## 2014-03-22 ENCOUNTER — Other Ambulatory Visit: Payer: Self-pay | Admitting: *Deleted

## 2014-03-22 MED ORDER — ISOSORBIDE MONONITRATE ER 60 MG PO TB24: 60.0000 mg | ORAL_TABLET | Freq: Every day | ORAL | Status: DC

## 2014-03-22 NOTE — Telephone Encounter (Signed)
RX sent to patients pharmacy. 03/22/14

## 2014-03-25 ENCOUNTER — Other Ambulatory Visit (INDEPENDENT_AMBULATORY_CARE_PROVIDER_SITE_OTHER): Payer: BLUE CROSS/BLUE SHIELD | Admitting: *Deleted

## 2014-03-25 DIAGNOSIS — E785 Hyperlipidemia, unspecified: Secondary | ICD-10-CM

## 2014-03-25 LAB — LIPID PANEL
CHOLESTEROL: 155 mg/dL (ref 0–200)
HDL: 33.7 mg/dL — AB (ref >39.00)
LDL Cholesterol: 102 mg/dL — ABNORMAL HIGH (ref 0–99)
NonHDL: 121.3
Total CHOL/HDL Ratio: 5
Triglycerides: 96 mg/dL (ref 0.0–149.0)
VLDL: 19.2 mg/dL (ref 0.0–40.0)

## 2014-03-25 LAB — HEPATIC FUNCTION PANEL
ALT: 18 U/L (ref 0–53)
AST: 18 U/L (ref 0–37)
Albumin: 3.9 g/dL (ref 3.5–5.2)
Alkaline Phosphatase: 67 U/L (ref 39–117)
BILIRUBIN TOTAL: 0.6 mg/dL (ref 0.2–1.2)
Bilirubin, Direct: 0 mg/dL (ref 0.0–0.3)
TOTAL PROTEIN: 7.1 g/dL (ref 6.0–8.3)

## 2014-04-03 ENCOUNTER — Other Ambulatory Visit: Payer: Self-pay | Admitting: Cardiovascular Disease

## 2014-04-15 ENCOUNTER — Encounter: Payer: Self-pay | Admitting: Nurse Practitioner

## 2014-04-15 ENCOUNTER — Ambulatory Visit (INDEPENDENT_AMBULATORY_CARE_PROVIDER_SITE_OTHER): Payer: BLUE CROSS/BLUE SHIELD | Admitting: Nurse Practitioner

## 2014-04-15 VITALS — BP 114/70 | HR 81 | Temp 98.4°F | Ht 70.0 in | Wt 244.0 lb

## 2014-04-15 DIAGNOSIS — J0110 Acute frontal sinusitis, unspecified: Secondary | ICD-10-CM

## 2014-04-15 MED ORDER — AMOXICILLIN-POT CLAVULANATE 875-125 MG PO TABS: 1.0000 | ORAL_TABLET | Freq: Two times a day (BID) | ORAL | Status: DC

## 2014-04-15 NOTE — Progress Notes (Signed)
Pre visit review using our clinic review tool, if applicable. No additional management support is needed unless otherwise documented below in the visit note. 

## 2014-04-15 NOTE — Patient Instructions (Signed)
Start antibiotic. Eat yogurt daily at lunch or afternoon to help prevent diarrhea that can be caused by antibiotic. Start daily sinus rinses (Neilmed Sinus rinse) for at least 5-7 days. Please call for re-evaluation if you are not improving.   Sinusitis Sinusitis is redness, soreness, and swelling (inflammation) of the paranasal sinuses. Paranasal sinuses are air pockets within the bones of your face (beneath the eyes, the middle of the forehead, or above the eyes). In healthy paranasal sinuses, mucus is able to drain out, and air is able to circulate through them by way of your nose. However, when your paranasal sinuses are inflamed, mucus and air can become trapped. This can allow bacteria and other germs to grow and cause infection. Sinusitis can develop quickly and last only a short time (acute) or continue over a long period (chronic). Sinusitis that lasts for more than 12 weeks is considered chronic.  CAUSES  Causes of sinusitis include:  Allergies.  Structural abnormalities, such as displacement of the cartilage that separates your nostrils (deviated septum), which can decrease the air flow through your nose and sinuses and affect sinus drainage.  Functional abnormalities, such as when the small hairs (cilia) that line your sinuses and help remove mucus do not work properly or are not present. SYMPTOMS  Symptoms of acute and chronic sinusitis are the same. The primary symptoms are pain and pressure around the affected sinuses. Other symptoms include:  Upper toothache.  Earache.  Headache.  Bad breath.  Decreased sense of smell and taste.  A cough, which worsens when you are lying flat.  Fatigue.  Fever.  Thick drainage from your nose, which often is green and may contain pus (purulent).  Swelling and warmth over the affected sinuses. DIAGNOSIS  Your caregiver will perform a physical exam. During the exam, your caregiver may:  Look in your nose for signs of abnormal  growths in your nostrils (nasal polyps).  Tap over the affected sinus to check for signs of infection.  View the inside of your sinuses (endoscopy) with a special imaging device with a light attached (endoscope), which is inserted into your sinuses. If your caregiver suspects that you have chronic sinusitis, one or more of the following tests may be recommended:  Allergy tests.  Nasal culture A sample of mucus is taken from your nose and sent to a lab and screened for bacteria.  Nasal cytology A sample of mucus is taken from your nose and examined by your caregiver to determine if your sinusitis is related to an allergy. TREATMENT  Most cases of acute sinusitis are related to a viral infection and will resolve on their own within 10 days. Sometimes medicines are prescribed to help relieve symptoms (pain medicine, decongestants, nasal steroid sprays, or saline sprays).  However, for sinusitis related to a bacterial infection, your caregiver will prescribe antibiotic medicines. These are medicines that will help kill the bacteria causing the infection.  Rarely, sinusitis is caused by a fungal infection. In theses cases, your caregiver will prescribe antifungal medicine. For some cases of chronic sinusitis, surgery is needed. Generally, these are cases in which sinusitis recurs more than 3 times per year, despite other treatments. HOME CARE INSTRUCTIONS   Drink plenty of water. Water helps thin the mucus so your sinuses can drain more easily.  Use a humidifier.  Inhale steam 3 to 4 times a day (for example, sit in the bathroom with the shower running).  Apply a warm, moist washcloth to your face 3 to  4 times a day, or as directed by your caregiver.  Use saline nasal sprays to help moisten and clean your sinuses.  Take over-the-counter or prescription medicines for pain, discomfort, or fever only as directed by your caregiver. SEEK IMMEDIATE MEDICAL CARE IF:  You have increasing pain or  severe headaches.  You have nausea, vomiting, or drowsiness.  You have swelling around your face.  You have vision problems.  You have a stiff neck.  You have difficulty breathing. MAKE SURE YOU:   Understand these instructions.  Will watch your condition.  Will get help right away if you are not doing well or get worse. Document Released: 03/01/2005 Document Revised: 05/24/2011 Document Reviewed: 03/16/2011 [facility] Patient Information 2014 [place], [place].

## 2014-04-15 NOTE — Progress Notes (Signed)
   Subjective:    Patient ID: Devon Brady, male    DOB: 1950-07-22, 64 y.o.   MRN: [id number]  Sinusitis This is a new problem. The current episode started 1 to 4 weeks ago (2-3 weeks). The problem has been gradually worsening since onset. There has been no fever. The pain is moderate (R frontil Ha & R Ear pain, R upper mandible pain ). Associated symptoms include congestion, coughing, ear pain, headaches and sinus pressure. Pertinent negatives include no chills, shortness of breath, sneezing, sore throat or swollen glands. Treatments tried: flonase. The treatment provided no relief.      Review of Systems  Constitutional: Positive for fatigue. Negative for fever, chills, activity change and appetite change.  HENT: Positive for congestion, ear pain, nosebleeds, postnasal drip and sinus pressure. Negative for sneezing and sore throat.   Respiratory: Positive for cough. Negative for chest tightness, shortness of breath and wheezing.   Neurological: Positive for headaches.       Objective:   Physical Exam  Constitutional: He is oriented to person, place, and time. He appears well-developed and well-nourished. No distress.  HENT:  Head: Normocephalic and atraumatic.  Right Ear: External ear normal.  Left Ear: External ear normal.  Mouth/Throat: Oropharynx is clear and moist. No oropharyngeal exudate.  R TM vessels injected, bones visible  Eyes: Conjunctivae are normal. Right eye exhibits no discharge. Left eye exhibits no discharge.  Neck: Normal range of motion. Neck supple. No thyromegaly present.  Cardiovascular: Normal rate, regular rhythm and normal heart sounds.   No murmur heard. Pulmonary/Chest: Effort normal and breath sounds normal. No respiratory distress. He has no wheezes. He has no rales.  Lymphadenopathy:    He has no cervical adenopathy.  Neurological: He is alert and oriented to person, place, and time.  Skin: Skin is warm and dry.  Psychiatric: He has a normal  mood and affect. His behavior is normal. Thought content normal.  Vitals reviewed.         Assessment & Plan:  1. Acute frontal sinusitis, recurrence not specified - amoxicillin-clavulanate (AUGMENTIN) 875-125 MG per tablet; Take 1 tablet by mouth 2 (two) times daily.  Dispense: 10 tablet; Refill: 0 Sinus rinse twice daily F/u prn

## 2014-04-17 ENCOUNTER — Other Ambulatory Visit: Payer: Self-pay | Admitting: *Deleted

## 2014-04-17 DIAGNOSIS — E785 Hyperlipidemia, unspecified: Secondary | ICD-10-CM

## 2014-04-17 MED ORDER — ATORVASTATIN CALCIUM 80 MG PO TABS: 80.0000 mg | ORAL_TABLET | Freq: Every day | ORAL | Status: DC

## 2014-05-03 ENCOUNTER — Ambulatory Visit (INDEPENDENT_AMBULATORY_CARE_PROVIDER_SITE_OTHER): Payer: BLUE CROSS/BLUE SHIELD | Admitting: Cardiovascular Disease

## 2014-05-03 ENCOUNTER — Encounter: Payer: Self-pay | Admitting: Cardiovascular Disease

## 2014-05-03 VITALS — BP 118/80 | HR 72 | Ht 70.0 in | Wt 245.0 lb

## 2014-05-03 DIAGNOSIS — R29898 Other symptoms and signs involving the musculoskeletal system: Secondary | ICD-10-CM

## 2014-05-03 DIAGNOSIS — I25810 Atherosclerosis of coronary artery bypass graft(s) without angina pectoris: Secondary | ICD-10-CM

## 2014-05-03 DIAGNOSIS — E785 Hyperlipidemia, unspecified: Secondary | ICD-10-CM

## 2014-05-03 DIAGNOSIS — I10 Essential (primary) hypertension: Secondary | ICD-10-CM

## 2014-05-03 MED ORDER — NITROGLYCERIN 0.4 MG SL SUBL: SUBLINGUAL_TABLET | SUBLINGUAL | Status: DC

## 2014-05-03 NOTE — Patient Instructions (Signed)
Your physician wants you to follow-up in: 6 months. You will receive a reminder letter in the mail two months in advance. If you don't receive a letter, please call our office to schedule the follow-up appointment.  Your physician has requested that you have a lower or upper extremity arterial duplex. This test is an ultrasound of the arteries in the legs or arms. It looks at arterial blood flow in the legs and arms. Allow one hour for Lower and Upper Arterial scans. There are no restrictions or special instructions   

## 2014-05-03 NOTE — Progress Notes (Signed)
History of Present Illness: 64 yo Brady with history of CAD s/p CABG August 2011, HTN, hyperlipidemia, stage IV Hodgkins lymphoma who is here today for cardiac follow up. Cardiac cath in August 2011 with severe triple vessel disease. He underwent 5V CABG August 2011 per Dr. Roxy Manns with LIMA to LAD, SVG to Ramus, SVG sequential to OM1/OM2, SVG to RV marginal. He was discovered to have thyroid cancer in 2014 and he underwent a thyroidectomy. Admitted to [facility] 10/02/13 with chest pain and ruled in for MI with elevated troponin. Cardiac cath per Dr. Tamala Julian on 10/03/13 with patent IMA graft to LAD, patent SVG to Ramus, occluded SVG to OM1/OM2, SVG to PDA. There were no focal targets for PCI. Imdur was added but he has since stopped taking.   He is here today for follow up. He tells me that he has been doing well. No chest pains or SOB. No palpitations. C/o Leg weakness but no pain. No dizziness, near syncope or syncope.   Primary Care Physician: Dr. Ronnald Ramp  Last Lipid Profile:Lipid Panel     Component Value Date/Time   CHOL 155 03/25/2014 0747   TRIG 96.0 03/25/2014 0747   HDL 33.70* 03/25/2014 0747   CHOLHDL 5 03/25/2014 0747   VLDL 19.2 03/25/2014 0747   LDLCALC 102* 03/25/2014 0747     Past Medical History  Diagnosis Date  . Hypertension   . Hyperlipidemia   . Hydronephrosis   . Renal insufficiency   . Hemorrhoid   . Sleep apnea     No CPAP  . Hodgkin's lymphoma   . 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 H/O colonoscopy 2013  . CAD (coronary artery disease)   . TIA (transient ischemic attack) 2011  . H/O hiatal hernia   . History of colon polyps   . Nocturia   . 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 Myocardial infarction 2011    "mild one, before the CABG"  . Hypothyroidism     Past Surgical History  Procedure Laterality Date  . Vasectomy  1980  . Bunionectomy Right 1978    foot  . Cardiac catheterization  10/15/2009  . Portacath  placement  06/10/2011    Procedure: INSERTION PORT-A-CATH;  Surgeon: [name], MD;  Location: [facility];  Service: General;  Laterality: Left;  subclavian   . Lymph node biopsy  06/07/11    RIGHT INGUINAL NODE: CLASSICAL HODGKIN'S LYMPHOMA, NODULAR SCLEROSIS TYPE  . Bone marrow aspirate,biopsy, and clot  06/11/11    LEFT ILIAC CREAST  . Tonsillectomy      as a child  . Coronary artery bypass graft  10/17/2009    LIMA to LAD,SVG to Ramus,SVG to OM sequential to OM2, SVG to marginal of RCA  . Colonoscopy    . Esophagogastroduodenoscopy    . Thyroidectomy  03/31/2012    Procedure: THYROIDECTOMY;  Surgeon: Izora Gala, MD;  Location: [place];  Service: ENT;  Laterality: N/A;  . Port-a-cath removal Left 12/06/2013    Procedure: REMOVAL PORT-A-CATH;  Surgeon: [name], MD;  Location: [place];  Service: General;  Laterality: Left;  . Left heart catheterization with coronary/graft angiogram N/A 10/03/2013    Procedure: LEFT HEART CATHETERIZATION WITH Beatrix Fetters;  Surgeon: Sinclair Grooms, MD;  Location[facility] [address] C[facility];  Service: Cardiovascular;  Laterality: N/A;    Current Outpatient Prescriptions  Medication Sig Dispense Refill  . aspirin EC 81  MG tablet Take 81 mg by mouth daily.    Marland Kitchen atorvastatin (LIPITOR) 80 MG tablet Take 1 tablet (80 mg total) by mouth daily. 30 tablet 11  . co-enzyme Q-10 30 MG capsule Take 100 mg by mouth daily.     . isosorbide mononitrate (IMDUR) 60 MG 24 hr tablet Take 1 tablet (60 mg total) by mouth daily. (Patient not taking: Reported on 04/15/2014) 30 tablet 0  . Lactobacillus (PROBIOTIC ACIDOPHILUS PO) Take 1 capsule by mouth daily.     Marland Kitchen levothyroxine (SYNTHROID) 175 MCG tablet Take 1 tablet (175 mcg total) by mouth daily before breakfast. 90 tablet 1  . metoprolol succinate (TOPROL-XL) Devon MG 24 hr tablet Take Devon mg by mouth daily. Take with or immediately following a meal.    . Multiple Vitamin (MULTIVITAMIN WITH MINERALS) TABS tablet  Take 1 tablet by mouth daily.    Marland Kitchen NITROSTAT 0.4 MG SL tablet PLACE 1 TABLET UNDER THE TONGUE EVERY 5 MINUTES AS NEEDED FOR CHEST PAIN 25 tablet 1  . Omega-3 Fatty Acids (FISH OIL PO) Take 15 mLs by mouth daily.    . [DISCONTINUED] citalopram (CELEXA) 20 MG tablet Take 1 tablet (20 mg total) by mouth daily. 30 tablet 1  . [DISCONTINUED] omeprazole (PRILOSEC OTC) 20 MG tablet Take 1 tablet (20 mg total) by mouth daily. 28 tablet 1   No current facility-administered medications for this visit.    Allergies  Allergen Reactions  . Crestor [Rosuvastatin]     Muscle aches  . Cialis [Tadalafil]     headache    History   Social History  . Marital Status: Married    Spouse Name: N/A  . Number of Children: 2  . Years of Education: N/A   Occupational History  . 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    Social History Main Topics  . Smoking status: Never Smoker   . Smokeless tobacco: Never Used  . Alcohol Use: Yes     Comment: 10/02/2013 "couple beers; couple times/month"  . Drug Use: No  . Sexual Activity: No   Other Topics Concern  . Not on file   Social History Narrative    Family History  Problem Relation Age of Onset  . Arthritis Mother   . Hypertension Mother   . Diabetes Father   . Coronary artery disease Father 2    Died age 29  . Kidney disease Father   . Prostate cancer Paternal Uncle     Great  . Hypertension Sister   . Hypertension Sister     2nd Sister  . Alcohol abuse Brother   . Cancer Neg Hx   . Drug abuse Neg Hx   . Early death Neg Hx   . Stroke Neg Hx     Review of Systems:  As stated in the HPI and otherwise negative.   BP 118/80 mmHg  Pulse 72  Ht 5\' 10"  (1.778 m)  Wt 245 lb (111.131 kg)  BMI 35.15 kg/m2  SpO2 99%  Physical Examination: General: Well developed, well nourished, NAD HEENT: OP clear, mucus membranes moist SKIN: warm, dry. No rashes. Neuro: No focal deficits Musculoskeletal: Muscle strength 5/5 all  ext Psychiatric: Mood and affect normal Neck: No JVD, no carotid bruits, no thyromegaly, no lymphadenopathy. Lungs:Clear bilaterally, no wheezes, rhonci, crackles Cardiovascular: Regular rate and rhythm. No murmurs, gallops or rubs. Abdomen:Soft. Bowel sounds present. Non-tender.  Extremities: No lower extremity edema. Pulses are 2 + in the bilateral DP/PT.  Cardiac cath 10/03/13:  The left main coronary artery is widely patent with smooth distal 30% narrowing.  The left anterior descending artery is totally occluded proximal..  The left circumflex artery is widely patent with Devon% mid vessel narrowing. The first and second marginal branches are patent. The second and third marginal branches are totally occluded. The left circumflex PDA is widely patent. Retrograde filling of the sequential graft to the fourth and third marginal branch is noted.  The right coronary artery is patent but there is severe disease in the acute marginal branch which is a relatively large vessel. It is severely and diffusely involved with a proximal 80% stenosis followed by a proximal 80% stenosis in the mid to distal eccentric 80% stenosis. The continuation of the right coronary beyond the acute marginal branch is 99% obstructed.  BYPASS GRAFT ANGIOGRAPHY: SVG to the ramus intermedius branch) first obtuse marginal) is widely patent.  SVG sequential to the obtuse marginal branches is totally occluded with thrombus present beyond the side to side anastomosis with a small first branch.  SVG to acute marginal of the right coronary is totally occluded at the aorto ostial.  LIMA to the LAD is widely patent  LEFT VENTRICULOGRAM: Left ventricular angiogram was done in the 30 RAO projection and revealed inferior hypokinesis. Overall ejection fraction is normal and estimated to be Devon%.   Assessment and Plan:   1. CAD: Stable. Will continue ASA, beta blocker, statin.   2. HTN: BP well controlled. No changes.   3. HLD:  Continue Lipitor. Goal LDL 70. Lipitor recently increased to 80 mg daily in January 2016.   4. Bilateral leg weakness: No pain or cramping with walking. Mainly just weakness in thighs that comes and goes. No change with increase in statin therapy. Will arrange ABI with dopplers to exclude PAD.

## 2014-05-17 ENCOUNTER — Ambulatory Visit (HOSPITAL_COMMUNITY): Payer: BLUE CROSS/BLUE SHIELD | Attending: Cardiology | Admitting: *Deleted

## 2014-05-17 DIAGNOSIS — R209 Unspecified disturbances of skin sensation: Secondary | ICD-10-CM | POA: Insufficient documentation

## 2014-05-17 DIAGNOSIS — R200 Anesthesia of skin: Secondary | ICD-10-CM | POA: Diagnosis not present

## 2014-05-17 DIAGNOSIS — E785 Hyperlipidemia, unspecified: Secondary | ICD-10-CM | POA: Diagnosis not present

## 2014-05-17 DIAGNOSIS — E119 Type 2 diabetes mellitus without complications: Secondary | ICD-10-CM | POA: Diagnosis not present

## 2014-05-17 DIAGNOSIS — R29898 Other symptoms and signs involving the musculoskeletal system: Secondary | ICD-10-CM

## 2014-05-17 DIAGNOSIS — I10 Essential (primary) hypertension: Secondary | ICD-10-CM | POA: Diagnosis not present

## 2014-05-17 DIAGNOSIS — Z951 Presence of aortocoronary bypass graft: Secondary | ICD-10-CM | POA: Insufficient documentation

## 2014-05-17 DIAGNOSIS — I2510 Atherosclerotic heart disease of native coronary artery without angina pectoris: Secondary | ICD-10-CM | POA: Diagnosis not present

## 2014-05-17 NOTE — Progress Notes (Signed)
Lower Extremity Arterial Doppler Exam Performed

## 2014-07-08 ENCOUNTER — Other Ambulatory Visit (INDEPENDENT_AMBULATORY_CARE_PROVIDER_SITE_OTHER): Payer: BC Managed Care – PPO | Admitting: *Deleted

## 2014-07-08 DIAGNOSIS — E785 Hyperlipidemia, unspecified: Secondary | ICD-10-CM | POA: Diagnosis not present

## 2014-07-08 LAB — HEPATIC FUNCTION PANEL
ALBUMIN: 4.1 g/dL (ref 3.5–5.2)
ALT: 18 U/L (ref 0–53)
AST: 23 U/L (ref 0–37)
Alkaline Phosphatase: 76 U/L (ref 39–117)
Bilirubin, Direct: 0 mg/dL (ref 0.0–0.3)
TOTAL PROTEIN: 7.2 g/dL (ref 6.0–8.3)
Total Bilirubin: 0.2 mg/dL (ref 0.2–1.2)

## 2014-07-08 LAB — LIPID PANEL
CHOLESTEROL: 127 mg/dL (ref 0–200)
HDL: 41.1 mg/dL (ref >39.00)
LDL Cholesterol: 74 mg/dL (ref 0–99)
NONHDL: 85.9
TRIGLYCERIDES: 61 mg/dL (ref 0.0–149.0)
Total CHOL/HDL Ratio: 3
VLDL: 12.2 mg/dL (ref 0.0–40.0)

## 2014-09-13 ENCOUNTER — Other Ambulatory Visit (HOSPITAL_COMMUNITY): Payer: Self-pay | Admitting: Endocrinology

## 2014-09-13 DIAGNOSIS — C73 Malignant neoplasm of thyroid gland: Secondary | ICD-10-CM

## 2014-09-27 ENCOUNTER — Telehealth: Payer: Self-pay | Admitting: Internal Medicine

## 2014-09-27 MED ORDER — METOPROLOL SUCCINATE ER 50 MG PO TB24: 50.0000 mg | ORAL_TABLET | Freq: Every day | ORAL | Status: DC

## 2014-09-27 NOTE — Telephone Encounter (Signed)
Approved.  

## 2014-09-27 NOTE — Telephone Encounter (Signed)
Patient is requesting refill on metoprolol to be sent to [facility] on [address].

## 2014-09-30 ENCOUNTER — Encounter (HOSPITAL_COMMUNITY)
Admission: RE | Admit: 2014-09-30 | Discharge: 2014-09-30 | Disposition: A | Discharge status: Home / Self Care | Payer: Managed Care, Other (non HMO) | Source: Ambulatory Visit | Attending: Endocrinology | Admitting: Endocrinology

## 2014-09-30 DIAGNOSIS — E890 Postprocedural hypothyroidism: Secondary | ICD-10-CM | POA: Insufficient documentation

## 2014-09-30 DIAGNOSIS — C73 Malignant neoplasm of thyroid gland: Secondary | ICD-10-CM | POA: Diagnosis not present

## 2014-09-30 MED ORDER — THYROTROPIN ALFA 1.1 MG IM SOLR
0.9000 mg | INTRAMUSCULAR | Status: AC
  Administered 2014-09-30: 0.9 mg via INTRAMUSCULAR

## 2014-10-01 ENCOUNTER — Encounter (HOSPITAL_COMMUNITY): Payer: Managed Care, Other (non HMO)

## 2014-10-01 DIAGNOSIS — C73 Malignant neoplasm of thyroid gland: Secondary | ICD-10-CM | POA: Diagnosis not present

## 2014-10-01 MED ORDER — THYROTROPIN ALFA 1.1 MG IM SOLR
0.9000 mg | Freq: Once | INTRAMUSCULAR | Status: AC
  Administered 2014-10-01: 0.9 mg via INTRAMUSCULAR

## 2014-10-02 ENCOUNTER — Encounter (HOSPITAL_COMMUNITY)
Admission: RE | Admit: 2014-10-02 | Discharge: 2014-10-02 | Disposition: A | Discharge status: Home / Self Care | Payer: Managed Care, Other (non HMO) | Source: Ambulatory Visit | Attending: Endocrinology | Admitting: Endocrinology

## 2014-10-04 ENCOUNTER — Encounter (HOSPITAL_COMMUNITY)
Admission: RE | Admit: 2014-10-04 | Discharge: 2014-10-04 | Disposition: A | Discharge status: Home / Self Care | Payer: Managed Care, Other (non HMO) | Source: Ambulatory Visit | Attending: Endocrinology | Admitting: Endocrinology

## 2014-10-04 DIAGNOSIS — C73 Malignant neoplasm of thyroid gland: Secondary | ICD-10-CM | POA: Diagnosis not present

## 2014-10-04 MED ORDER — SODIUM IODIDE I 131 CAPSULE
4.0000 | Freq: Once | INTRAVENOUS | Status: AC | PRN
  Administered 2014-10-04: 4 via ORAL

## 2014-10-21 ENCOUNTER — Telehealth: Payer: Self-pay | Admitting: Cardiovascular Disease

## 2014-10-21 MED ORDER — ATORVASTATIN CALCIUM 40 MG PO TABS: 40.0000 mg | ORAL_TABLET | Freq: Every day | ORAL | Status: DC

## 2014-10-21 NOTE — Telephone Encounter (Signed)
Left message to call back  

## 2014-10-21 NOTE — Telephone Encounter (Signed)
Spoke with pt and he states that since increasing his Lipitor to 80mg  QD he has been having issues with pain in his feet/ankles. Pt states that he did not have this issue when he was on the 40mg  QD. Pt states that he went out of town this past weekend and forgot to take his medication with him and the pain subsided.  Started back on it yesterday when he returned and said pain is back today. Informed pt that I would route this information to Dr. Angelena Form for review and advisement.

## 2014-10-21 NOTE — Telephone Encounter (Signed)
He can reduce dose of Lipitor to 40 mg per day. Thanks, chris

## 2014-10-21 NOTE — Telephone Encounter (Signed)
pt having trouble with Lipitor , having pain in feet, better this weekend but didn't take any med, took again last night and pain back pls advise [phone] or [phone]

## 2014-10-21 NOTE — Telephone Encounter (Signed)
Spoke with pt and informed him that Dr. Angelena Form said he can reduce dose of Lipitor to 40mg  QD. Pt verbalized understanding and was in agreement with this plan.

## 2014-11-01 ENCOUNTER — Telehealth: Payer: Self-pay | Admitting: Cardiovascular Disease

## 2014-11-01 NOTE — Telephone Encounter (Signed)
Patient called back. Patient stated after cutting back to Lipitor 40 mg daily, from Lipitor 80 mg daily, that he is still having pain in his feet, ankles, and finger joints. Patient stated that it is always worse in the am, then pm. Will forward to Dr. Angelena Form for any changes or suggestions.

## 2014-11-01 NOTE — Telephone Encounter (Signed)
Left message for patient to call back  

## 2014-11-01 NOTE — Telephone Encounter (Signed)
New message     Pt states he is still having pain in his feet, ankles, and finger joints.  He thinks it is from the cholesterol medication.  Please call

## 2014-11-04 NOTE — Telephone Encounter (Signed)
Left message to call back  

## 2014-11-04 NOTE — Telephone Encounter (Signed)
We can try cutting back to Lipitor 20 mg daily to see if this helps. He is already on CoQ10. If this does not help, would suggest statin holiday for one month to see if it resolves. Devon Brady

## 2014-11-06 ENCOUNTER — Ambulatory Visit (HOSPITAL_BASED_OUTPATIENT_CLINIC_OR_DEPARTMENT_OTHER): Payer: Managed Care, Other (non HMO) | Admitting: Hematology and Oncology

## 2014-11-06 ENCOUNTER — Other Ambulatory Visit (HOSPITAL_BASED_OUTPATIENT_CLINIC_OR_DEPARTMENT_OTHER): Payer: Managed Care, Other (non HMO)

## 2014-11-06 ENCOUNTER — Encounter: Payer: Self-pay | Admitting: Hematology and Oncology

## 2014-11-06 ENCOUNTER — Telehealth: Payer: Self-pay | Admitting: Hematology and Oncology

## 2014-11-06 VITALS — BP 114/98 | HR 73 | Temp 97.9°F | Resp 18 | Ht 70.0 in | Wt 241.4 lb

## 2014-11-06 DIAGNOSIS — E1121 Type 2 diabetes mellitus with diabetic nephropathy: Secondary | ICD-10-CM

## 2014-11-06 DIAGNOSIS — C8190 Hodgkin lymphoma, unspecified, unspecified site: Secondary | ICD-10-CM

## 2014-11-06 DIAGNOSIS — K625 Hemorrhage of anus and rectum: Secondary | ICD-10-CM

## 2014-11-06 DIAGNOSIS — Z85850 Personal history of malignant neoplasm of thyroid: Secondary | ICD-10-CM

## 2014-11-06 DIAGNOSIS — E1329 Other specified diabetes mellitus with other diabetic kidney complication: Secondary | ICD-10-CM

## 2014-11-06 DIAGNOSIS — Z8579 Personal history of other malignant neoplasms of lymphoid, hematopoietic and related tissues: Secondary | ICD-10-CM | POA: Diagnosis not present

## 2014-11-06 DIAGNOSIS — I2510 Atherosclerotic heart disease of native coronary artery without angina pectoris: Secondary | ICD-10-CM

## 2014-11-06 DIAGNOSIS — C73 Malignant neoplasm of thyroid gland: Secondary | ICD-10-CM

## 2014-11-06 LAB — CBC WITH DIFFERENTIAL/PLATELET
BASO%: 0.8 % (ref 0.0–2.0)
Basophils Absolute: 0 10*3/uL (ref 0.0–0.1)
EOS ABS: 0.1 10*3/uL (ref 0.0–0.5)
EOS%: 2.1 % (ref 0.0–7.0)
HEMATOCRIT: 48.5 % (ref 38.4–49.9)
HEMOGLOBIN: 16.1 g/dL (ref 13.0–17.1)
LYMPH#: 2 10*3/uL (ref 0.9–3.3)
LYMPH%: 31.7 % (ref 14.0–49.0)
MCH: 29 pg (ref 27.2–33.4)
MCHC: 33.3 g/dL (ref 32.0–36.0)
MCV: 87.1 fL (ref 79.3–98.0)
MONO#: 0.5 10*3/uL (ref 0.1–0.9)
MONO%: 7.1 % (ref 0.0–14.0)
NEUT%: 58.3 % (ref 39.0–75.0)
NEUTROS ABS: 3.7 10*3/uL (ref 1.5–6.5)
PLATELETS: 194 10*3/uL (ref 140–400)
RBC: 5.57 10*6/uL (ref 4.20–5.82)
RDW: 14.7 % — AB (ref 11.0–14.6)
WBC: 6.4 10*3/uL (ref 4.0–10.3)

## 2014-11-06 LAB — COMPREHENSIVE METABOLIC PANEL (CC13)
ALBUMIN: 3.8 g/dL (ref 3.5–5.0)
ALK PHOS: 99 U/L (ref 40–150)
ALT: 13 U/L (ref 0–55)
AST: 15 U/L (ref 5–34)
Anion Gap: 8 mEq/L (ref 3–11)
BUN: 19.3 mg/dL (ref 7.0–26.0)
CALCIUM: 9.6 mg/dL (ref 8.4–10.4)
CHLORIDE: 112 meq/L — AB (ref 98–109)
CO2: 23 mEq/L (ref 22–29)
Creatinine: 1.5 mg/dL — ABNORMAL HIGH (ref 0.7–1.3)
EGFR: 58 mL/min/{1.73_m2} — AB (ref >90)
Glucose: 107 mg/dl (ref 70–140)
Potassium: 4.2 mEq/L (ref 3.5–5.1)
Sodium: 142 mEq/L (ref 136–145)
Total Bilirubin: 0.36 mg/dL (ref 0.20–1.20)
Total Protein: 6.9 g/dL (ref 6.4–8.3)

## 2014-11-06 LAB — HEMOGLOBIN A1C
Hgb A1c MFr Bld: 6.1 % — ABNORMAL HIGH (ref <5.7)
Mean Plasma Glucose: 128 mg/dL — ABNORMAL HIGH (ref <117)

## 2014-11-06 LAB — LACTATE DEHYDROGENASE (CC13): LDH: 174 U/L (ref 125–245)

## 2014-11-06 NOTE — Progress Notes (Signed)
Chief Complaint  Patient presents with  . Leg Pain     History of Present Illness: 64 yo AAM with history of CAD s/p CABG August 2011, HTN, hyperlipidemia, stage IV Hodgkins lymphoma who is here today for cardiac follow up. Cardiac cath in August 2011 with severe triple vessel disease. He underwent 5V CABG August 2011 per Dr. Roxy Manns with LIMA to LAD, SVG to Ramus, SVG sequential to OM1/OM2, SVG to RV marginal. He was discovered to have thyroid cancer in 2014 and he underwent a thyroidectomy. Admitted to [facility] 10/02/13 with chest pain and ruled in for MI with elevated troponin. Cardiac cath per Dr. Tamala Julian on 10/03/13 with patent IMA graft to LAD, patent SVG to Ramus, occluded SVG to OM1/OM2, SVG to PDA. There were no focal targets for PCI. Imdur was added but he stopped taking this. C/O leg weakness at visit in February 2016. ABI normal March 2016.   He is here today for follow up. He tells me that he has been doing well. No chest pains or SOB. No palpitations. No dizziness, near syncope or syncope. He has been having bilateral foot pain, mostly when putting weight on his feet. His heel aches with walking.   Primary Care Physician: Dr. Ronnald Ramp  Last Lipid Profile:Lipid Panel     Component Value Date/Time   CHOL 127 07/08/2014 0802   TRIG 61.0 07/08/2014 0802   HDL 41.10 07/08/2014 0802   CHOLHDL 3 07/08/2014 0802   VLDL 12.2 07/08/2014 0802   LDLCALC 74 07/08/2014 0802     Past Medical History  Diagnosis Date  . Hypertension   . Hyperlipidemia   . Hydronephrosis   . Renal insufficiency   . Hemorrhoid   . Sleep apnea     No CPAP  . Hodgkin's lymphoma   . 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 H/O colonoscopy 2013  . CAD (coronary artery disease)   . TIA (transient ischemic attack) 2011  . H/O hiatal hernia   . History of colon polyps   . Nocturia   . 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 Myocardial infarction 2011    "mild one, before  the CABG"  . Hypothyroidism     Past Surgical History  Procedure Laterality Date  . Vasectomy  1980  . Bunionectomy Right 1978    foot  . Cardiac catheterization  10/15/2009  . Portacath placement  06/10/2011    Procedure: INSERTION PORT-A-CATH;  Surgeon: [name], MD;  Location: [facility];  Service: General;  Laterality: Left;  subclavian   . Lymph node biopsy  06/07/11    RIGHT INGUINAL NODE: CLASSICAL HODGKIN'S LYMPHOMA, NODULAR SCLEROSIS TYPE  . Bone marrow aspirate,biopsy, and clot  06/11/11    LEFT ILIAC CREAST  . Tonsillectomy      as a child  . Coronary artery bypass graft  10/17/2009    LIMA to LAD,SVG to Ramus,SVG to OM sequential to OM2, SVG to marginal of RCA  . Colonoscopy    . Esophagogastroduodenoscopy    . Thyroidectomy  03/31/2012    Procedure: THYROIDECTOMY;  Surgeon: Izora Gala, MD;  Location: [place];  Service: ENT;  Laterality: N/A;  . Port-a-cath removal Left 12/06/2013    Procedure: REMOVAL PORT-A-CATH;  Surgeon: [name], MD;  Location: [place];  Service: General;  Laterality: Left;  . Left heart catheterization with coronary/graft angiogram N/A 10/03/2013    Procedure: LEFT HEART CATHETERIZATION  WITH Beatrix Fetters;  Surgeon: Sinclair Grooms, MD;  Location: [facility] C[facility];  Service: Cardiovascular;  Laterality: N/A;    Current Outpatient Prescriptions  Medication Sig Dispense Refill  . levothyroxine (SYNTHROID, LEVOTHROID) 175 MCG tablet Take 175 mcg by mouth daily.    Marland Kitchen aspirin EC 81 MG tablet Take 81 mg by mouth daily.    Marland Kitchen atorvastatin (LIPITOR) 40 MG tablet Take 1 tablet (40 mg total) by mouth daily. 90 tablet 2  . metoprolol succinate (TOPROL-XL) 50 MG 24 hr tablet Take 1 tablet (50 mg total) by mouth daily. Take with or immediately following a meal. 30 tablet 5  . Multiple Vitamin (MULTIVITAMIN WITH MINERALS) TABS tablet Take 1 tablet by mouth daily.    . nitroGLYCERIN (NITROSTAT) 0.4 MG SL tablet PLACE 1 TABLET UNDER THE  TONGUE EVERY 5 MINUTES AS NEEDED FOR CHEST PAIN 25 tablet 6  . Omega-3 Fatty Acids (FISH OIL PO) Take 15 mLs by mouth daily.    . [DISCONTINUED] citalopram (CELEXA) 20 MG tablet Take 1 tablet (20 mg total) by mouth daily. 30 tablet 1  . [DISCONTINUED] omeprazole (PRILOSEC OTC) 20 MG tablet Take 1 tablet (20 mg total) by mouth daily. 28 tablet 1   No current facility-administered medications for this visit.    Allergies  Allergen Reactions  . Crestor [Rosuvastatin]     Muscle aches  . Cialis [Tadalafil]     headache    Social History   Social History  . Marital Status: Married    Spouse Name: N/A  . Number of Children: 2  . Years of Education: N/A   Occupational History  . 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    Social History Main Topics  . Smoking status: Never Smoker   . Smokeless tobacco: Never Used  . Alcohol Use: Yes     Comment: 10/02/2013 "couple beers; couple times/month"  . Drug Use: No  . Sexual Activity: No   Other Topics Concern  . Not on file   Social History Narrative    Family History  Problem Relation Age of Onset  . Arthritis Mother   . Hypertension Mother   . Diabetes Father   . Coronary artery disease Father 75    Died age 16  . Kidney disease Father   . Prostate cancer Paternal Uncle     Great  . Hypertension Sister   . Hypertension Sister     2nd Sister  . Alcohol abuse Brother   . Cancer Neg Hx   . Drug abuse Neg Hx   . Early death Neg Hx   . Stroke Neg Hx     Review of Systems:  As stated in the HPI and otherwise negative.   BP 126/84 mmHg  Pulse 71  Ht 5\' 10"  (1.778 m)  Wt 239 lb (108.41 kg)  BMI 34.29 kg/m2  Physical Examination: General: Well developed, well nourished, NAD HEENT: OP clear, mucus membranes moist SKIN: warm, dry. No rashes. Neuro: No focal deficits Musculoskeletal: Muscle strength 5/5 all ext Psychiatric: Mood and affect normal Neck: No JVD, no carotid bruits, no thyromegaly, no  lymphadenopathy. Lungs:Clear bilaterally, no wheezes, rhonci, crackles Cardiovascular: Regular rate and rhythm. No murmurs, gallops or rubs. Abdomen:Soft. Bowel sounds present. Non-tender.  Extremities: No lower extremity edema. Pulses are 2 + in the bilateral DP/PT.  Cardiac cath 10/03/13:  The left main coronary artery is widely patent with smooth distal 30% narrowing.  The left anterior descending artery is  totally occluded proximal..  The left circumflex artery is widely patent with 50% mid vessel narrowing. The first and second marginal branches are patent. The second and third marginal branches are totally occluded. The left circumflex PDA is widely patent. Retrograde filling of the sequential graft to the fourth and third marginal branch is noted.  The right coronary artery is patent but there is severe disease in the acute marginal branch which is a relatively large vessel. It is severely and diffusely involved with a proximal 80% stenosis followed by a proximal 80% stenosis in the mid to distal eccentric 80% stenosis. The continuation of the right coronary beyond the acute marginal branch is 99% obstructed.  BYPASS GRAFT ANGIOGRAPHY: SVG to the ramus intermedius branch) first obtuse marginal) is widely patent.  SVG sequential to the obtuse marginal branches is totally occluded with thrombus present beyond the side to side anastomosis with a small first branch.  SVG to acute marginal of the right coronary is totally occluded at the aorto ostial.  LIMA to the LAD is widely patent  LEFT VENTRICULOGRAM: Left ventricular angiogram was done in the 30 RAO projection and revealed inferior hypokinesis. Overall ejection fraction is normal and estimated to be 50%.  EKG:  EKG is ordered today. The ekg ordered today demonstrates NSR, rate 71 bpm.  Incomplete RBBB  Recent Labs: 02/13/2014: TSH 0.05* 11/06/2014: ALT 13; BUN 19.3; Creatinine 1.5*; HGB 16.1; Platelets 194; Potassium 4.2; Sodium 142    Lipid Panel    Component Value Date/Time   CHOL 127 07/08/2014 0802   TRIG 61.0 07/08/2014 0802   HDL 41.10 07/08/2014 0802   CHOLHDL 3 07/08/2014 0802   VLDL 12.2 07/08/2014 0802   LDLCALC 74 07/08/2014 0802     Wt Readings from Last 3 Encounters:  11/07/14 239 lb (108.41 kg)  11/06/14 241 lb 6.4 oz (109.498 kg)  05/03/14 245 lb (111.131 kg)     Other studies Reviewed: Additional studies/ records that were reviewed today include: . Review of the above records demonstrates:    Assessment and Plan:   1. CAD: Stable. Will continue ASA, beta blocker, statin. He stopped Imdur.   2. HTN: BP well controlled. No changes.   3. HLD: Continue Lipitor. Goal LDL 70. Lipitor cut back to 40 mg this year due to leg pain. Will repeat lipids and LFTs today.   4. Bilateral foot pain: No pain or cramping with walking. Pain is in both feet. Sounds like plantar fasciitis. Will refer to podiatry. Normal  ABI 2016.    Current medicines are reviewed at length with the patient today.  The patient does not have concerns regarding medicines.  The following changes have been made:  no change  Labs/ tests ordered today include:   Orders Placed This Encounter  Procedures  . Lipid Profile  . Hepatic function panel  . EKG 12-Lead     Disposition:   FU with me in 6 months   Signed, Lauree Chandler, MD 11/07/2014 9:19 AM    [facility] [place], [place], [place]  [address] Phone: [phone]; Fax: [phone]

## 2014-11-06 NOTE — Assessment & Plan Note (Signed)
The patient requests a hemoglobin A1c to be drawn along with other blood work so that he could share the results with his primary care doctor. This is drawn. The patient is attempting to lose weight and is active with exercise he will continue current medical management. I recommend close follow-up with primary care doctor for medication adjustment.

## 2014-11-06 NOTE — Telephone Encounter (Signed)
Gave patient avs report and appointments for August 2017.  °

## 2014-11-06 NOTE — Assessment & Plan Note (Signed)
He has no signs of disease recurrence. Clinical exam, blood work and imaging study were normal. I will see him once a year with history, physical examination and blood work.

## 2014-11-06 NOTE — Assessment & Plan Note (Signed)
He has rectal bleeding for the past 2 days which he attributed to possible hemorrhoidal bleeding. This is resolving. I do not feel strongly he needs to stop his aspirin therapy which he is taking for prior history of coronary artery disease I recommend evaluation by gastroenterologist and he will call the office to set up an appointment for possible repeat colonoscopy

## 2014-11-06 NOTE — Assessment & Plan Note (Signed)
The patient will continue medical management under the care of his cardiologist. He has no clinical signs or symptoms of congestive heart failure.

## 2014-11-06 NOTE — Assessment & Plan Note (Signed)
He has no signs of disease recurrence. Recent thyroid scan was negative.

## 2014-11-06 NOTE — Progress Notes (Signed)
[facility] OFFICE PROGRESS NOTE  Patient Care Team: Janith Lima, MD as PCP - General (Internal Medicine) Corliss Parish, MD as Consulting Physician (Nephrology) Jacelyn Pi, MD as Consulting Physician (Endocrinology) Izora Gala, MD as Consulting Physician (Otolaryngology) Clarene Essex, MD as Consulting Physician (Gastroenterology) Heath Lark, MD as Consulting Physician (Hematology and Oncology)  SUMMARY OF ONCOLOGIC HISTORY: Oncology History   Hodgkin's lymphoma, nodular sclerosing   Primary site: Lymphoid Neoplasms (Bilateral)   Clinical: Stage IV signed by Heath Lark, MD on 05/07/2013  3:56 PM   Pathologic: Stage IV signed by Heath Lark, MD on 05/07/2013  3:56 PM   Summary: Stage IV Primary thyroid papillary carcinoma   Primary site: Thyroid - Papillary or Follicular (45 years and older) (Left)   Staging method: AJCC 7th Edition   Clinical: Stage I (T1, N0, M0) signed by Heath Lark, MD on 05/07/2013  3:48 PM   Pathologic: Stage I (T1, N0, cM0) signed by Heath Lark, MD on 05/07/2013  3:48 PM   Summary: Stage I (T1, N0, cM0)       Hodgkin's lymphoma   05/28/2011 Imaging CT scan of the chest, abdomen and pelvis shows significant and diffuse lymphadenopathy   06/07/2011 Surgery Lymph node biopsy from the right inguinal region came back positive for nodular sclerosing Hodgkin lymphoma   06/11/2011 - 11/12/2011 Chemotherapy The patient received treatment with ABVD for Hodgkin's lymphoma. 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    06/11/2011 Imaging PET CT scan show bilateral cervical, axillary, inguinal, retroperitoneal lymphadenopathy and bone involvement   06/11/2011 Bone Marrow Biopsy Bone marrow biopsy was negative for recurrence   08/05/2011 Imaging Repeat PET scan show marked reduction in the lymphadenopathy   11/25/2011 Imaging PET CT scan show almost near complete resolution of  disease   03/31/2012 Surgery The patient underwent thyroid resection which show papillary thyroid cancer   05/15/2012 Imaging Repeat CT scan show complete resolution of all disease   08/30/2012 - 08/30/2012 Radiation Therapy The patient received radioactive iodine treatment for thyroid cancer   11/17/2012 Imaging Repeat CT scan showed complete resolution of all disease   05/07/2013 Imaging Repeat CT scan show no evidence of disease recurrence    INTERVAL HISTORY: Please see below for problem oriented charting. He returns for further follow-up. He is doing well. Denies new lymphadenopathy. Denies recent infection. His only new complaint is rectal bleeding, bright red blood per rectum for 2 days. It is resolving. He thinks it could be hemorrhoidal bleeding but he cannot be sure. He has no recent weight loss. Denies other change in bowel habits  REVIEW OF SYSTEMS:   Constitutional: Denies fevers, chills or abnormal weight loss Eyes: Denies blurriness of vision Ears, nose, mouth, throat, and face: Denies mucositis or sore throat Respiratory: Denies cough, dyspnea or wheezes Cardiovascular: Denies palpitation, chest discomfort or lower extremity swelling Gastrointestinal:  Denies nausea, heartburn or change in bowel habits Skin: Denies abnormal skin rashes Lymphatics: Denies new lymphadenopathy or easy bruising Neurological:Denies numbness, tingling or new weaknesses Behavioral/Psych: Mood is stable, no new changes  All other systems were reviewed with the patient and are negative.  I have reviewed the past medical history, past surgical history, social history and family history with the patient and they are unchanged from previous note.  ALLERGIES:  is allergic to crestor and cialis.  MEDICATIONS:  Current Outpatient Prescriptions  Medication Sig Dispense Refill  . aspirin EC  81 MG tablet Take 81 mg by mouth daily.    Marland Kitchen atorvastatin (LIPITOR) 40 MG tablet Take 1 tablet (40 mg total) by  mouth daily. 90 tablet 2  . isosorbide mononitrate (IMDUR) 60 MG 24 hr tablet Take 1 tablet (60 mg total) by mouth daily. 30 tablet 0  . levothyroxine (SYNTHROID, LEVOTHROID) 150 MCG tablet Take 150 mcg by mouth daily.  0  . metoprolol succinate (TOPROL-XL) 50 MG 24 hr tablet Take 1 tablet (50 mg total) by mouth daily. Take with or immediately following a meal. 30 tablet 5  . Multiple Vitamin (MULTIVITAMIN WITH MINERALS) TABS tablet Take 1 tablet by mouth daily.    . nitroGLYCERIN (NITROSTAT) 0.4 MG SL tablet PLACE 1 TABLET UNDER THE TONGUE EVERY 5 MINUTES AS NEEDED FOR CHEST PAIN 25 tablet 6  . Omega-3 Fatty Acids (FISH OIL PO) Take 15 mLs by mouth daily.    . [DISCONTINUED] citalopram (CELEXA) 20 MG tablet Take 1 tablet (20 mg total) by mouth daily. 30 tablet 1  . [DISCONTINUED] omeprazole (PRILOSEC OTC) 20 MG tablet Take 1 tablet (20 mg total) by mouth daily. 28 tablet 1   No current facility-administered medications for this visit.    PHYSICAL EXAMINATION: ECOG PERFORMANCE STATUS: 0 - Asymptomatic  Filed Vitals:   11/06/14 0847  BP: 114/98  Pulse: 73  Temp: 97.9 F (36.6 C)  Resp: 18   Filed Weights   11/06/14 0847  Weight: 241 lb 6.4 oz (109.498 kg)    GENERAL:alert, no distress and comfortable SKIN: skin color, texture, turgor are normal, no rashes or significant lesions EYES: normal, Conjunctiva are pink and non-injected, sclera clear OROPHARYNX:no exudate, no erythema and lips, buccal mucosa, and tongue normal  NECK: supple, thyroid normal size, non-tender, without nodularity LYMPH:  no palpable lymphadenopathy in the cervical, axillary or inguinal LUNGS: clear to auscultation and percussion with normal breathing effort HEART: regular rate & rhythm and no murmurs and no lower extremity edema ABDOMEN:abdomen soft, non-tender and normal bowel sounds Musculoskeletal:no cyanosis of digits and no clubbing  NEURO: alert & oriented x 3 with fluent speech, no focal  motor/sensory deficits  LABORATORY DATA:  I have reviewed the data as listed    Component Value Date/Time   NA 138 02/13/2014 0950   NA 144 11/01/2013 0839   K 4.6 02/13/2014 0950   K 4.4 11/01/2013 0839   CL 105 02/13/2014 0950   CL 106 05/15/2012 0815   CO2 24 02/13/2014 0950   CO2 23 11/01/2013 0839   GLUCOSE 92 02/13/2014 0950   GLUCOSE 104 11/01/2013 0839   GLUCOSE 106* 05/15/2012 0815   BUN 22 02/13/2014 0950   BUN 20.0 11/01/2013 0839   CREATININE 1.5 02/13/2014 0950   CREATININE 1.5* 11/01/2013 0839   CALCIUM 9.6 02/13/2014 0950   CALCIUM 9.9 11/01/2013 0839   PROT 7.2 07/08/2014 0802   PROT 7.2 11/01/2013 0839   ALBUMIN 4.1 07/08/2014 0802   ALBUMIN 3.8 11/01/2013 0839   AST 23 07/08/2014 0802   AST 18 11/01/2013 0839   ALT 18 07/08/2014 0802   ALT 12 11/01/2013 0839   ALKPHOS 76 07/08/2014 0802   ALKPHOS 75 11/01/2013 0839   BILITOT 0.2 07/08/2014 0802   BILITOT 0.28 11/01/2013 0839   GFRNONAA 56* 10/04/2013 0800   GFRAA 65* 10/04/2013 0800    No results found for: SPEP, UPEP  Lab Results  Component Value Date   WBC 6.4 11/06/2014   NEUTROABS 3.7 11/06/2014   HGB 16.1  11/06/2014   HCT 48.5 11/06/2014   MCV 87.1 11/06/2014   PLT 194 11/06/2014      Chemistry      Component Value Date/Time   NA 138 02/13/2014 0950   NA 144 11/01/2013 0839   K 4.6 02/13/2014 0950   K 4.4 11/01/2013 0839   CL 105 02/13/2014 0950   CL 106 05/15/2012 0815   CO2 24 02/13/2014 0950   CO2 23 11/01/2013 0839   BUN 22 02/13/2014 0950   BUN 20.0 11/01/2013 0839   CREATININE 1.5 02/13/2014 0950   CREATININE 1.5* 11/01/2013 0839      Component Value Date/Time   CALCIUM 9.6 02/13/2014 0950   CALCIUM 9.9 11/01/2013 0839   ALKPHOS 76 07/08/2014 0802   ALKPHOS 75 11/01/2013 0839   AST 23 07/08/2014 0802   AST 18 11/01/2013 0839   ALT 18 07/08/2014 0802   ALT 12 11/01/2013 0839   BILITOT 0.2 07/08/2014 0802   BILITOT 0.28 11/01/2013 0839       ASSESSMENT &  PLAN:  Hodgkin's lymphoma He has no signs of disease recurrence. Clinical exam, blood work and imaging study were normal. I will see him once a year with history, physical examination and blood work.    Primary thyroid papillary carcinoma He has no signs of disease recurrence. Recent thyroid scan was negative.  Diabetes mellitus with kidney disease The patient requests a hemoglobin A1c to be drawn along with other blood work so that he could share the results with his primary care doctor. This is drawn. The patient is attempting to lose weight and is active with exercise he will continue current medical management. I recommend close follow-up with primary care doctor for medication adjustment.   Rectal bleeding He has rectal bleeding for the past 2 days which he attributed to possible hemorrhoidal bleeding. This is resolving. I do not feel strongly he needs to stop his aspirin therapy which he is taking for prior history of coronary artery disease I recommend evaluation by gastroenterologist and he will call the office to set up an appointment for possible repeat colonoscopy  CAD (coronary artery disease) The patient will continue medical management under the care of his cardiologist. He has no clinical signs or symptoms of congestive heart failure.    Orders Placed This Encounter  Procedures  . Hemoglobin A1c    Standing Status: Future     Number of Occurrences: 1     Standing Expiration Date: 11/06/2015  . Comprehensive metabolic panel    Standing Status: Future     Number of Occurrences:      Standing Expiration Date: 12/11/2015  . CBC with Differential/Platelet    Standing Status: Future     Number of Occurrences:      Standing Expiration Date: 12/11/2015  . Lactate dehydrogenase    Standing Status: Future     Number of Occurrences:      Standing Expiration Date: 12/11/2015   All questions were answered. The patient knows to call the clinic with any problems,  questions or concerns. No barriers to learning was detected. I spent 15 minutes counseling the patient face to face. The total time spent in the appointment was 20 minutes and more than 50% was on counseling and review of test results     [facility], Elroy Schembri, MD 11/06/2014 9:22 AM

## 2014-11-07 ENCOUNTER — Ambulatory Visit (INDEPENDENT_AMBULATORY_CARE_PROVIDER_SITE_OTHER): Payer: Managed Care, Other (non HMO) | Admitting: Cardiovascular Disease

## 2014-11-07 ENCOUNTER — Encounter: Payer: Self-pay | Admitting: Cardiovascular Disease

## 2014-11-07 VITALS — BP 126/84 | HR 71 | Ht 70.0 in | Wt 239.0 lb

## 2014-11-07 DIAGNOSIS — I25810 Atherosclerosis of coronary artery bypass graft(s) without angina pectoris: Secondary | ICD-10-CM

## 2014-11-07 DIAGNOSIS — E785 Hyperlipidemia, unspecified: Secondary | ICD-10-CM | POA: Diagnosis not present

## 2014-11-07 DIAGNOSIS — I10 Essential (primary) hypertension: Secondary | ICD-10-CM

## 2014-11-07 LAB — LIPID PANEL
CHOL/HDL RATIO: 3
CHOLESTEROL: 145 mg/dL (ref 0–200)
HDL: 44.1 mg/dL (ref >39.00)
LDL Cholesterol: 89 mg/dL (ref 0–99)
NonHDL: 100.42
TRIGLYCERIDES: 58 mg/dL (ref 0.0–149.0)
VLDL: 11.6 mg/dL (ref 0.0–40.0)

## 2014-11-07 LAB — HEPATIC FUNCTION PANEL
ALBUMIN: 4.2 g/dL (ref 3.5–5.2)
ALK PHOS: 70 U/L (ref 39–117)
ALT: 10 U/L (ref 0–53)
AST: 15 U/L (ref 0–37)
Bilirubin, Direct: 0.1 mg/dL (ref 0.0–0.3)
TOTAL PROTEIN: 7.2 g/dL (ref 6.0–8.3)
Total Bilirubin: 0.5 mg/dL (ref 0.2–1.2)

## 2014-11-07 NOTE — Patient Instructions (Addendum)
Medication Instructions:  Your physician recommends that you continue on your current medications as directed. Please refer to the Current Medication list given to you today.   Labwork: Lab work to be done today--Lipid and liver profiles  Testing/Procedures: none  Follow-Up: Your physician wants you to follow-up in: 6 months.  You will receive a reminder letter in the mail two months in advance. If you don't receive a letter, please call our office to schedule the follow-up appointment.   [facility]--[phone]

## 2014-11-07 NOTE — Telephone Encounter (Signed)
Pt saw Dr. McAlhany today 

## 2014-11-08 ENCOUNTER — Other Ambulatory Visit: Payer: Self-pay | Admitting: *Deleted

## 2014-11-08 DIAGNOSIS — E785 Hyperlipidemia, unspecified: Secondary | ICD-10-CM

## 2014-11-08 MED ORDER — ATORVASTATIN CALCIUM 80 MG PO TABS
80.0000 mg | ORAL_TABLET | Freq: Every day | ORAL | Status: DC
Start: 2014-11-08 — End: 2015-05-16

## 2014-11-19 ENCOUNTER — Encounter: Payer: Self-pay | Admitting: Podiatry

## 2014-11-19 ENCOUNTER — Ambulatory Visit (INDEPENDENT_AMBULATORY_CARE_PROVIDER_SITE_OTHER): Payer: Managed Care, Other (non HMO)

## 2014-11-19 ENCOUNTER — Ambulatory Visit (INDEPENDENT_AMBULATORY_CARE_PROVIDER_SITE_OTHER): Payer: Managed Care, Other (non HMO) | Admitting: Podiatry

## 2014-11-19 VITALS — BP 150/94 | HR 76 | Resp 16 | Ht 70.0 in | Wt 230.0 lb

## 2014-11-19 DIAGNOSIS — M722 Plantar fascial fibromatosis: Secondary | ICD-10-CM | POA: Diagnosis not present

## 2014-11-19 MED ORDER — METHYLPREDNISOLONE 4 MG PO TBPK: ORAL_TABLET | ORAL | Status: DC

## 2014-11-19 NOTE — Progress Notes (Signed)
   Subjective:    Patient ID: Devon Brady, male    DOB: 1950/09/09, 64 y.o.   MRN: [id number]  HPI he presents today as a 64 year old male chief complaint of bilateral heel pain right greater than left 5 months. States that he initially thought it was his cholesterol medication and had discontinued at the symptoms persisted. He saw his nurse practitioner who suggested plantar fasciitis and prescribed was sterilely Dosepak and he has purchased products from the [facility]. He states the mornings are particularly bad and anytime after he sits for a while and gets back up to work. He currently works in [place].    [place]  All other systems reviewed and are negative.      Objective:   Physical Exam: 64 year old male very pleasant no acute distress. Pulses are strongly palpable neurologic sensorium is intact for Semmes-Weinstein monofilament. Vital signs are stable he is alert and oriented 3. Deep tendon reflexes are intact bilaterally muscle strength is 5 over 5 dorsiflexion plantar flexors and inverters everters all into the musculature is intact. Orthopedic evaluation demonstrates all joints distal to the ankle for range of motion without crepitation. He has pain on palpation medial calcaneal tubercles bilateral heels. No pain on medial and lateral compression of the calcaneus. Radiographs 3 views taken in the office today demonstrate soft tissue increase in density at the plantar fascial calcaneal insertion site. No heel spurs no fractures are identified. Cutaneous evaluations demonstrates supple well-hydrated cutis no erythema edema cellulitis drainage or odor.        Assessment & Plan:  Plantar fasciitis bilateral right greater than left.  At this point we started him on a Medrol Dosepak. Due to his heart history we did not provide him with NSAIDs. I injected his bilateral heels today with Kenalog and local and aesthetic dispensed plantar fascial braces and a night  splint. We discussed appropriate shoe gear stretching exercises ice therapy shoe modifications. I will follow-up with him in 1 month.

## 2014-11-19 NOTE — Patient Instructions (Signed)

## 2014-12-17 ENCOUNTER — Ambulatory Visit (INDEPENDENT_AMBULATORY_CARE_PROVIDER_SITE_OTHER): Payer: Managed Care, Other (non HMO) | Admitting: Podiatry

## 2014-12-17 ENCOUNTER — Encounter: Payer: Self-pay | Admitting: Podiatry

## 2014-12-17 VITALS — BP 135/75 | HR 66 | Resp 16

## 2014-12-17 DIAGNOSIS — M722 Plantar fascial fibromatosis: Secondary | ICD-10-CM | POA: Diagnosis not present

## 2014-12-17 NOTE — Progress Notes (Signed)
He presents today for follow-up of his bilateral plantar fasciitis. He states that he never received the Medrol Dosepak. He states that his heels are 100% improved. He has been using his plantar fascial brace and that's all. He states he has not been using the night splint.  Objective: Vital signs are stable he is alert and oriented 3. Pulses are strongly palpable. No pain on palpation medial calcaneal tubercles bilateral. No erythema edema cellulitis drainage or odor.  Assessment: Well-healing plantar fasciitis bilateral.  Plan: I will follow-up with him on an as-needed basis. I suggest that he continue all conservative therapies that he is currently performing for at least one more month.

## 2014-12-31 ENCOUNTER — Ambulatory Visit (INDEPENDENT_AMBULATORY_CARE_PROVIDER_SITE_OTHER): Payer: Managed Care, Other (non HMO) | Admitting: Internal Medicine

## 2014-12-31 ENCOUNTER — Other Ambulatory Visit (INDEPENDENT_AMBULATORY_CARE_PROVIDER_SITE_OTHER): Payer: Managed Care, Other (non HMO)

## 2014-12-31 ENCOUNTER — Encounter: Payer: Self-pay | Admitting: Internal Medicine

## 2014-12-31 VITALS — BP 132/78 | HR 72 | Temp 98.2°F | Resp 16 | Ht 70.0 in | Wt 235.0 lb

## 2014-12-31 DIAGNOSIS — E890 Postprocedural hypothyroidism: Secondary | ICD-10-CM

## 2014-12-31 DIAGNOSIS — N289 Disorder of kidney and ureter, unspecified: Secondary | ICD-10-CM | POA: Diagnosis not present

## 2014-12-31 DIAGNOSIS — E1121 Type 2 diabetes mellitus with diabetic nephropathy: Secondary | ICD-10-CM | POA: Diagnosis not present

## 2014-12-31 DIAGNOSIS — K625 Hemorrhage of anus and rectum: Secondary | ICD-10-CM

## 2014-12-31 DIAGNOSIS — N400 Enlarged prostate without lower urinary tract symptoms: Secondary | ICD-10-CM

## 2014-12-31 DIAGNOSIS — Z0000 Encounter for general adult medical examination without abnormal findings: Secondary | ICD-10-CM | POA: Diagnosis not present

## 2014-12-31 LAB — BASIC METABOLIC PANEL
BUN: 23 mg/dL (ref 6–23)
CALCIUM: 9.5 mg/dL (ref 8.4–10.5)
CO2: 31 mEq/L (ref 19–32)
CREATININE: 1.49 mg/dL (ref 0.40–1.50)
Chloride: 107 mEq/L (ref 96–112)
GFR: 61.22 mL/min (ref >60.00)
Glucose, Bld: 99 mg/dL (ref 70–99)
Potassium: 4.5 mEq/L (ref 3.5–5.1)
Sodium: 142 mEq/L (ref 135–145)

## 2014-12-31 LAB — URINALYSIS, ROUTINE W REFLEX MICROSCOPIC
Bilirubin Urine: NEGATIVE
Ketones, ur: NEGATIVE
LEUKOCYTES UA: NEGATIVE
Nitrite: NEGATIVE
PH: 6 (ref 5.0–8.0)
Specific Gravity, Urine: 1.025 (ref 1.000–1.030)
TOTAL PROTEIN, URINE-UPE24: 30 — AB
URINE GLUCOSE: NEGATIVE
UROBILINOGEN UA: 0.2 (ref 0.0–1.0)
WBC, UA: NONE SEEN (ref 0–?)

## 2014-12-31 LAB — TSH: TSH: 0.04 u[IU]/mL — ABNORMAL LOW (ref 0.35–4.50)

## 2014-12-31 LAB — PSA: PSA: 1.48 ng/mL (ref 0.10–4.00)

## 2014-12-31 LAB — MICROALBUMIN / CREATININE URINE RATIO
Creatinine,U: 228.7 mg/dL
MICROALB UR: 27.7 mg/dL — AB (ref 0.0–1.9)
Microalb Creat Ratio: 12.1 mg/g (ref 0.0–30.0)

## 2014-12-31 LAB — FECAL OCCULT BLOOD, GUAIAC: FECAL OCCULT BLD: NEGATIVE

## 2014-12-31 MED ORDER — LEVOTHYROXINE SODIUM 125 MCG PO TABS: 125.0000 ug | ORAL_TABLET | Freq: Every day | ORAL | Status: DC

## 2014-12-31 NOTE — Progress Notes (Signed)
Subjective:  Patient ID: Devon Brady, male    DOB: 1951-03-08  Age: 64 y.o. MRN: [id number]  CC: Hypertension; Hypothyroidism; and Annual Exam   HPI Devon Brady presents for a CPX and f/up on HTN and HypoT - he feels well and offers no complaints. He asks me to fax his lab results to his kidney doctor.  Outpatient Prescriptions Prior to Visit  Medication Sig Dispense Refill  . aspirin EC 81 MG tablet Take 81 mg by mouth daily.    Marland Kitchen atorvastatin (LIPITOR) 80 MG tablet Take 1 tablet (80 mg total) by mouth daily. 90 tablet 3  . metoprolol succinate (TOPROL-XL) 50 MG 24 hr tablet Take 1 tablet (50 mg total) by mouth daily. Take with or immediately following a meal. 30 tablet 5  . Multiple Vitamin (MULTIVITAMIN WITH MINERALS) TABS tablet Take 1 tablet by mouth daily.    . nitroGLYCERIN (NITROSTAT) 0.4 MG SL tablet PLACE 1 TABLET UNDER THE TONGUE EVERY 5 MINUTES AS NEEDED FOR CHEST PAIN 25 tablet 6  . Omega-3 Fatty Acids (FISH OIL PO) Take 15 mLs by mouth daily.    Marland Kitchen levothyroxine (SYNTHROID, LEVOTHROID) 175 MCG tablet Take 175 mcg by mouth daily.     No facility-administered medications prior to visit.    ROS Review of Systems  Constitutional: Negative.  Negative for fever, chills, diaphoresis, appetite change and fatigue.  HENT: Negative.   Eyes: Negative.   Respiratory: Negative.  Negative for cough, choking, chest tightness, shortness of breath and stridor.   Cardiovascular: Negative.  Negative for chest pain, palpitations and leg swelling.  Gastrointestinal: Negative.  Negative for nausea, vomiting, abdominal pain, diarrhea, constipation and blood in stool.  Endocrine: Negative.   Genitourinary: Negative.  Negative for dysuria, urgency, frequency, hematuria, flank pain, difficulty urinating and testicular pain.  Musculoskeletal: Negative.  Negative for myalgias, back pain, joint swelling and arthralgias.  Skin: Negative.  Negative for rash.  Allergic/Immunologic:  Negative.   Neurological: Negative.  Negative for dizziness, tremors, syncope, light-headedness and numbness.  Hematological: Negative.  Negative for adenopathy. Does not bruise/bleed easily.  Psychiatric/Behavioral: Negative.     Objective:  BP 132/78 mmHg  Pulse 72  Temp(Src) 98.2 F (36.8 C) (Oral)  Resp 16  Ht 5\' 10"  (1.778 m)  Wt 235 lb (106.595 kg)  BMI 33.72 kg/m2  SpO2 96%  BP Readings from Last 3 Encounters:  12/31/14 132/78  12/17/14 135/75  11/19/14 150/94    Wt Readings from Last 3 Encounters:  12/31/14 235 lb (106.595 kg)  11/19/14 230 lb (104.327 kg)  11/07/14 239 lb (108.41 kg)    Physical Exam  Constitutional: He is oriented to person, place, and time. He appears well-developed and well-nourished. No distress.  HENT:  Head: Normocephalic and atraumatic.  Mouth/Throat: Oropharynx is clear and moist. No oropharyngeal exudate.  Eyes: Conjunctivae are normal. Right eye exhibits no discharge. Left eye exhibits no discharge. No scleral icterus.  Neck: Normal range of motion. Neck supple. No JVD present. No tracheal deviation present. No thyromegaly present.  Cardiovascular: Normal rate, regular rhythm, normal heart sounds and intact distal pulses.  Exam reveals no gallop and no friction rub.   No murmur heard. Pulmonary/Chest: Effort normal and breath sounds normal. No stridor. No respiratory distress. He has no wheezes. He has no rales. He exhibits no tenderness.  Abdominal: Soft. Bowel sounds are normal. He exhibits no distension and no mass. There is no tenderness. There is no rebound and no guarding. Hernia confirmed  negative in the right inguinal area and confirmed negative in the left inguinal area.  Genitourinary: Testes normal and penis normal. Guaiac negative stool. Right testis shows no mass, no swelling and no tenderness. Right testis is descended. Left testis shows no mass, no swelling and no tenderness. Left testis is descended. Circumcised. No penile  erythema or penile tenderness. No discharge found.  Musculoskeletal: Normal range of motion. He exhibits no edema or tenderness.  Lymphadenopathy:    He has no cervical adenopathy.       Right: No inguinal adenopathy present.       Left: No inguinal adenopathy present.  Neurological: He is oriented to person, place, and time.  Skin: Skin is warm and dry. No rash noted. He is not diaphoretic. No erythema. No pallor.  Psychiatric: He has a normal mood and affect. His behavior is normal. Judgment and thought content normal.  Vitals reviewed.   Lab Results  Component Value Date   WBC 6.4 11/06/2014   HGB 16.1 11/06/2014   HCT 48.5 11/06/2014   PLT 194 11/06/2014   GLUCOSE 99 12/31/2014   CHOL 145 11/07/2014   TRIG 58.0 11/07/2014   HDL 44.10 11/07/2014   LDLCALC 89 11/07/2014   ALT 10 11/07/2014   AST 15 11/07/2014   NA 142 12/31/2014   K 4.5 12/31/2014   CL 107 12/31/2014   CREATININE 1.49 12/31/2014   BUN 23 12/31/2014   CO2 31 12/31/2014   TSH 0.04* 12/31/2014   PSA 1.48 12/31/2014   INR 0.97 10/02/2013   HGBA1C 6.1* 11/06/2014   MICROALBUR 27.7* 12/31/2014    Nm Whole Body I131 Scan W/thyrogen  10/04/2014  CLINICAL DATA:  History of thyroid cancer. Status post thyroidectomy and radioactive iodine ablation. EXAM: THYROGEN-STIMULATED I-131 WHOLE BODY SCAN TECHNIQUE: The patient received 0.9 mg Thyrogen intramuscularly every 24 hours for two doses. On the third day the patient returned and received the radiopharmaceutical, per orally. On the fifth day, the patient returned and whole body planar images were obtained in the anterior and posterior projections. RADIOPHARMACEUTICALS:  4.0 mCi I-131 sodium iodide orally COMPARISON:  09/21/2013 FINDINGS: No abnormal radiotracer activity is identified within the thyroid bed to suggest residual functioning thyroid tissue status post thyroidectomy and radioactive iodine ablation. No evidence for iodine avid distant metastatic disease.  Physiologic tracer activity is seen within the salivary glands, GI and GU tract. IMPRESSION: 1. No evidence for residual or recurrent iodine avid functioning thyroid tissue. Electronically Signed   By: Kerby Moors M.D.   On: 10/04/2014 11:43    Assessment & Plan:   Jearld was seen today for hypertension, hypothyroidism and annual exam.  Diagnoses and all orders for this visit:  Renal insufficiency- his renal function is stable/improved, results sent to his renal doctor -     Basic metabolic panel; Future -     Urinalysis, Routine w reflex microscopic (not at [facility]); Future  Hypothyroidism, postsurgical- his TSh is suppressed so I have lowered his synthroid dose -     TSH; Future -     levothyroxine (SYNTHROID, LEVOTHROID) 125 MCG tablet; Take 1 tablet (125 mcg total) by mouth daily.  Rectal bleeding- this has resolved  BPH (benign prostatic hyperplasia)- PSA is normal. There are no s/s that need to be treated. 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 Coopman levothyroxine. I am also having him start on levothyroxine. Additionally, I am having him maintain his aspirin EC, multivitamin with minerals, Omega-3 Fatty Acids (FISH OIL PO), nitroGLYCERIN, metoprolol succinate, and atorvastatin.  Meds ordered this encounter  Medications  . levothyroxine (SYNTHROID, LEVOTHROID) 125 MCG tablet    Sig: Take 1 tablet (125 mcg total) by mouth daily.    Dispense:  90 tablet    Refill:  1     Follow-up: Return in about 4 months (around 05/03/2015).  Scarlette Calico, MD

## 2014-12-31 NOTE — Patient Instructions (Signed)

## 2014-12-31 NOTE — Progress Notes (Signed)
Pre visit review using our clinic review tool, if applicable. No additional management support is needed unless otherwise documented below in the visit note. 

## 2015-01-31 ENCOUNTER — Other Ambulatory Visit (INDEPENDENT_AMBULATORY_CARE_PROVIDER_SITE_OTHER): Payer: Managed Care, Other (non HMO) | Admitting: *Deleted

## 2015-01-31 DIAGNOSIS — E785 Hyperlipidemia, unspecified: Secondary | ICD-10-CM | POA: Diagnosis not present

## 2015-01-31 LAB — LIPID PANEL
CHOL/HDL RATIO: 2.9 ratio (ref <=5.0)
Cholesterol: 149 mg/dL (ref 125–200)
HDL: 52 mg/dL (ref >=40)
LDL Cholesterol: 83 mg/dL (ref <130)
TRIGLYCERIDES: 68 mg/dL (ref <150)
VLDL: 14 mg/dL (ref <30)

## 2015-01-31 LAB — HEPATIC FUNCTION PANEL
ALBUMIN: 4.2 g/dL (ref 3.6–5.1)
ALT: 14 U/L (ref 9–46)
AST: 19 U/L (ref 10–35)
Alkaline Phosphatase: 92 U/L (ref 40–115)
BILIRUBIN TOTAL: 0.5 mg/dL (ref 0.2–1.2)
Bilirubin, Direct: 0.1 mg/dL (ref <=0.2)
Indirect Bilirubin: 0.4 mg/dL (ref 0.2–1.2)
TOTAL PROTEIN: 7.2 g/dL (ref 6.1–8.1)

## 2015-01-31 NOTE — Addendum Note (Signed)
Addended by: Eulis Foster on: 01/31/2015 08:15 AM   Modules accepted: Orders

## 2015-05-15 NOTE — Progress Notes (Signed)
Chief Complaint  Patient presents with  . Leg Pain    History of Present Illness: 65 yo AAM with history of CAD s/p CABG August 2011, HTN, hyperlipidemia, stage IV Hodgkins lymphoma who is here today for cardiac follow up. Cardiac cath in August 2011 with severe triple vessel disease. He underwent 5V CABG August 2011 per Dr. Roxy Manns with LIMA to LAD, SVG to Ramus, SVG sequential to OM1/OM2, SVG to RV marginal. He was discovered to have thyroid cancer in 2014 and he underwent a thyroidectomy. Admitted to [facility] 10/02/13 with chest pain and ruled in for MI with elevated troponin. Cardiac cath per Dr. Tamala Julian on 10/03/13 with patent IMA graft to LAD, patent SVG to Ramus, occluded SVG to OM1/OM2, SVG to PDA. There were no focal targets for PCI. Imdur was added but he stopped taking this. C/O leg weakness at visit in February 2016. ABI normal March 2016.   He is here today for follow up. He tells me that he has been doing well. No chest pains or SOB. No palpitations. No dizziness, near syncope or syncope.   Primary Care Physician: Dr. Ronnald Ramp  Past Medical History  Diagnosis Date  . Hypertension   . Hyperlipidemia   . Hydronephrosis   . Renal insufficiency   . Hemorrhoid   . Sleep apnea     No CPAP  . Hodgkin's lymphoma ([place])   . 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 H/O colonoscopy 2013  . CAD (coronary artery disease)   . TIA (transient ischemic attack) 2011  . H/O hiatal hernia   . History of colon polyps   . Nocturia   . 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 Myocardial infarction [facility]) 2011    "mild one, before the CABG"  . Hypothyroidism     Past Surgical History  Procedure Laterality Date  . Vasectomy  1980  . Bunionectomy Right 1978    foot  . Cardiac catheterization  10/15/2009  . Portacath placement  06/10/2011    Procedure: INSERTION PORT-A-CATH;  Surgeon: [name], MD;  Location: [facility];  Service: General;  Laterality:  Left;  subclavian   . Lymph node biopsy  06/07/11    RIGHT INGUINAL NODE: CLASSICAL HODGKIN'S LYMPHOMA, NODULAR SCLEROSIS TYPE  . Bone marrow aspirate,biopsy, and clot  06/11/11    LEFT ILIAC CREAST  . Tonsillectomy      as a child  . Coronary artery bypass graft  10/17/2009    LIMA to LAD,SVG to Ramus,SVG to OM sequential to OM2, SVG to marginal of RCA  . Colonoscopy    . Esophagogastroduodenoscopy    . Thyroidectomy  03/31/2012    Procedure: THYROIDECTOMY;  Surgeon: Izora Gala, MD;  Location: [place];  Service: ENT;  Laterality: N/A;  . Port-a-cath removal Left 12/06/2013    Procedure: REMOVAL PORT-A-CATH;  Surgeon: [name], MD;  Location: [place];  Service: General;  Laterality: Left;  . Left heart catheterization with coronary/graft angiogram N/A 10/03/2013    Procedure: LEFT HEART CATHETERIZATION WITH Beatrix Fetters;  Surgeon: Sinclair Grooms, MD;  Location: [facility] CATH LAB;  Service: Cardiovascular;  Laterality: N/A;    Current Outpatient Prescriptions  Medication Sig Dispense Refill  . aspirin EC 81 MG tablet Take 81 mg by mouth every evening.     Marland Kitchen atorvastatin (LIPITOR) 80 MG tablet Take 80 mg by mouth every evening.    Marland Kitchen  levothyroxine (SYNTHROID, LEVOTHROID) 125 MCG tablet Take 1 tablet (125 mcg total) by mouth daily. 90 tablet 1  . metoprolol succinate (TOPROL-XL) 50 MG 24 hr tablet Take 1 tablet (50 mg total) by mouth daily. Take with or immediately following a meal. 30 tablet 5  . Multiple Vitamin (MULTIVITAMIN WITH MINERALS) TABS tablet Take 1 tablet by mouth daily.    . nitroGLYCERIN (NITROSTAT) 0.4 MG SL tablet PLACE 1 TABLET UNDER THE TONGUE EVERY 5 MINUTES AS NEEDED FOR CHEST PAIN 25 tablet 6  . [DISCONTINUED] citalopram (CELEXA) 20 MG tablet Take 1 tablet (20 mg total) by mouth daily. 30 tablet 1  . [DISCONTINUED] omeprazole (PRILOSEC OTC) 20 MG tablet Take 1 tablet (20 mg total) by mouth daily. 28 tablet 1   No current facility-administered  medications for this visit.    Allergies  Allergen Reactions  . Crestor [Rosuvastatin]     Muscle aches  . Cialis [Tadalafil]     headache    Social History   Social History  . Marital Status: Married    Spouse Name: N/A  . Number of Children: 2  . Years of Education: N/A   Occupational History  . 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    Social History Main Topics  . Smoking status: Never Smoker   . Smokeless tobacco: Never Used  . Alcohol Use: Yes     Comment: 10/02/2013 "couple beers; couple times/month"  . Drug Use: No  . Sexual Activity: No   Other Topics Concern  . Not on file   Social History Narrative    Family History  Problem Relation Age of Onset  . Arthritis Mother   . Hypertension Mother   . Diabetes Father   . Coronary artery disease Father 18    Died age 12  . Kidney disease Father   . Prostate cancer Paternal Uncle     Great  . Hypertension Sister   . Hypertension Sister     2nd Sister  . Alcohol abuse Brother   . Cancer Neg Hx   . Drug abuse Neg Hx   . Early death Neg Hx   . Stroke Neg Hx     Review of Systems:  As stated in the HPI and otherwise negative.   BP 130/90 mmHg  Pulse 68  Ht 5\' 10"  (1.778 m)  Wt 240 lb (108.863 kg)  BMI 34.44 kg/m2  Physical Examination: General: Well developed, well nourished, NAD HEENT: OP clear, mucus membranes moist SKIN: warm, dry. No rashes. Neuro: No focal deficits Musculoskeletal: Muscle strength 5/5 all ext Psychiatric: Mood and affect normal Neck: No JVD, no carotid bruits, no thyromegaly, no lymphadenopathy. Lungs:Clear bilaterally, no wheezes, rhonci, crackles Cardiovascular: Regular rate and rhythm. No murmurs, gallops or rubs. Abdomen:Soft. Bowel sounds present. Non-tender.  Extremities: No lower extremity edema. Pulses are 2 + in the bilateral DP/PT.  Cardiac cath 10/03/13:  The left main coronary artery is widely patent with smooth distal 30% narrowing.  The left anterior  descending artery is totally occluded proximal..  The left circumflex artery is widely patent with 50% mid vessel narrowing. The first and second marginal branches are patent. The second and third marginal branches are totally occluded. The left circumflex PDA is widely patent. Retrograde filling of the sequential graft to the fourth and third marginal branch is noted.  The right coronary artery is patent but there is severe disease in the acute marginal branch which is a relatively large vessel. It is  severely and diffusely involved with a proximal 80% stenosis followed by a proximal 80% stenosis in the mid to distal eccentric 80% stenosis. The continuation of the right coronary beyond the acute marginal branch is 99% obstructed.  BYPASS GRAFT ANGIOGRAPHY: SVG to the ramus intermedius branch) first obtuse marginal) is widely patent.  SVG sequential to the obtuse marginal branches is totally occluded with thrombus present beyond the side to side anastomosis with a small first branch.  SVG to acute marginal of the right coronary is totally occluded at the aorto ostial.  LIMA to the LAD is widely patent  LEFT VENTRICULOGRAM: Left ventricular angiogram was done in the 30 RAO projection and revealed inferior hypokinesis. Overall ejection fraction is normal and estimated to be 50%.  EKG:  EKG is not ordered today. The ekg ordered today demonstrates   Recent Labs: 11/06/2014: HGB 16.1; Platelets 194 12/31/2014: BUN 23; Creatinine, Ser 1.49; Potassium 4.5; Sodium 142; TSH 0.04* 01/31/2015: ALT 14   Lipid Panel    Component Value Date/Time   CHOL 149 01/31/2015 0815   TRIG 68 01/31/2015 0815   HDL 52 01/31/2015 0815   CHOLHDL 2.9 01/31/2015 0815   VLDL 14 01/31/2015 0815   LDLCALC 83 01/31/2015 0815     Wt Readings from Last 3 Encounters:  05/16/15 240 lb (108.863 kg)  12/31/14 235 lb (106.595 kg)  11/19/14 230 lb (104.327 kg)     Other studies Reviewed: Additional studies/ records that  were reviewed today include: . Review of the above records demonstrates:    Assessment and Plan:   1. CAD: Stable. Will continue ASA, beta blocker, statin (after one month statin holiday). He stopped Imdur.   2. HTN: BP well controlled. No changes.   3. HLD: Continue Lipitor. LDL is near goal of 70.   4. Bilateral leg weakness: Will try holding statin for one month. If this helps will not restart statin but will consider Praluent injections. If the leg weakness does not improve while holding statin therapy, will restart Lipitor.  Normal ABI 2016.    5. Erectile dysfunction: He has tried Cialis in the past but this caused severe headaches. He does not wish to try Viagra but would like a Urology referral. Will refer to [facility] today.   Current medicines are reviewed at length with the patient today.  The patient does not have concerns regarding medicines.  The following changes have been made:  no change  Labs/ tests ordered today include:   Orders Placed This Encounter  Procedures  . Ambulatory referral to Urology     Disposition:   FU with me in 12 months   Signed, Lauree Chandler, MD 05/16/2015 9:19 AM    [facility] [address], [place], [place]  [address] Phone: [phone]; Fax: [phone]

## 2015-05-16 ENCOUNTER — Encounter: Payer: Self-pay | Admitting: Cardiovascular Disease

## 2015-05-16 ENCOUNTER — Ambulatory Visit (INDEPENDENT_AMBULATORY_CARE_PROVIDER_SITE_OTHER): Payer: Managed Care, Other (non HMO) | Admitting: Cardiovascular Disease

## 2015-05-16 VITALS — BP 130/90 | HR 68 | Ht 70.0 in | Wt 240.0 lb

## 2015-05-16 DIAGNOSIS — I25810 Atherosclerosis of coronary artery bypass graft(s) without angina pectoris: Secondary | ICD-10-CM | POA: Diagnosis not present

## 2015-05-16 DIAGNOSIS — E785 Hyperlipidemia, unspecified: Secondary | ICD-10-CM | POA: Diagnosis not present

## 2015-05-16 DIAGNOSIS — I10 Essential (primary) hypertension: Secondary | ICD-10-CM

## 2015-05-16 DIAGNOSIS — N5201 Erectile dysfunction due to arterial insufficiency: Secondary | ICD-10-CM

## 2015-05-16 DIAGNOSIS — R29898 Other symptoms and signs involving the musculoskeletal system: Secondary | ICD-10-CM

## 2015-05-16 NOTE — Patient Instructions (Signed)
Medication Instructions:  Your physician recommends that you continue on your current medications as directed. Please refer to the Current Medication list given to you today.  Stop taking Lipitor for one month to see if this helps with leg pain.  If better let us know.  If not better resume Lipitor.    Labwork: none  Testing/Procedures:  You have been referred to [facility]   Follow-Up: Your physician wants you to follow-up in: 12 months.   You will receive a reminder letter in the mail two months in advance. If you don't receive a letter, please call our office to schedule the follow-up appointment.   Any Other Special Instructions Will Be Listed Below (If Applicable).     If you need a refill on your cardiac medications before your next appointment, please call your pharmacy.

## 2015-06-02 ENCOUNTER — Other Ambulatory Visit: Payer: Self-pay | Admitting: Internal Medicine

## 2015-06-28 ENCOUNTER — Ambulatory Visit (HOSPITAL_COMMUNITY)
Admission: EM | Admit: 2015-06-28 | Discharge: 2015-06-28 | Disposition: A | Discharge status: Home / Self Care | Payer: Managed Care, Other (non HMO) | Attending: Family Medicine | Admitting: Family Medicine

## 2015-06-28 DIAGNOSIS — W57XXXA Bitten or stung by nonvenomous insect and other nonvenomous arthropods, initial encounter: Secondary | ICD-10-CM | POA: Diagnosis not present

## 2015-06-28 DIAGNOSIS — T148 Other injury of unspecified body region: Secondary | ICD-10-CM

## 2015-06-28 MED ORDER — DOXYCYCLINE HYCLATE 100 MG PO CAPS: 100.0000 mg | ORAL_CAPSULE | Freq: Two times a day (BID) | ORAL | Status: DC

## 2015-06-28 NOTE — Discharge Instructions (Signed)
Please see your PCP on Monday for further evaluation and management. See [place] as needed.  Tick Bite Information Ticks are insects that attach themselves to the skin. There are many types of ticks. Common types include wood ticks and deer ticks. Sometimes, ticks carry diseases that can make a person very ill. The most common places for ticks to attach themselves are the scalp, neck, armpits, waist, and groin.  HOW CAN YOU PREVENT TICK BITES? Take these steps to help prevent tick bites when you are outdoors:  Wear long sleeves and long pants.  Wear white clothes so you can see ticks more easily.  Tuck your pant legs into your socks.  If walking on a trail, stay in the middle of the trail to avoid brushing against bushes.  Avoid walking through areas with long grass.  Put bug spray on all skin that is showing and along boot tops, pant legs, and sleeve cuffs.  Check clothes, hair, and skin often and before going inside.  Brush off any ticks that are not attached.  Take a shower or bath as soon as possible after being outdoors. HOW SHOULD YOU REMOVE A TICK? Ticks should be removed as soon as possible to help prevent diseases. 1. If latex gloves are available, put them on before trying to remove a tick. 2. Use tweezers to grasp the tick as close to the skin as possible. You may also use curved forceps or a tick removal tool. Grasp the tick as close to its head as possible. Avoid grasping the tick on its body. 3. Pull gently upward until the tick lets go. Do not twist the tick or jerk it suddenly. This may break off the tick's head or mouth parts. 4. Do not squeeze or crush the tick's body. This could force disease-carrying fluids from the tick into your body. 5. After the tick is removed, wash the bite area and your hands with soap and water or alcohol. 6. Apply a small amount of antiseptic cream or ointment to the bite site. 7. Wash any tools that were used. Do not try to remove a tick by  applying a hot match, petroleum jelly, or fingernail polish to the tick. These methods do not work. They may also increase the chances of disease being spread from the tick bite. WHEN SHOULD YOU SEEK HELP? Contact your health care provider if you are unable to remove a tick or if a part of the tick breaks off in the skin. After a tick bite, you need to watch for signs and symptoms of diseases that can be spread by ticks. Contact your health care provider if you develop any of the following:  Fever.  Rash.  Redness and puffiness (swelling) in the area of the tick bite.  Tender, puffy lymph glands.  Watery poop (diarrhea).  Weight loss.  Cough.  Feeling more tired than normal (fatigue).  Muscle, joint, or bone pain.  Belly (abdominal) pain.  Headache.  Change in your level of consciousness.  Trouble walking or moving your legs.  Loss of feeling (numbness) in the legs.  Loss of movement (paralysis).  Shortness of breath.  Confusion.  Throwing up (vomiting) many times.   This information is not intended to replace advice given to you by your health care provider. Make sure you discuss any questions you have with your health care provider.   Document Released: 05/26/2009 Document Revised: 11/01/2012 Document Reviewed: 08/09/2012 Elsevier Interactive Patient Education [employer].

## 2015-06-28 NOTE — ED Notes (Signed)
The patient presented to the [facility] with a complaint of a possible infected tick bite that occurred yesterday on his left lower leg.

## 2015-06-28 NOTE — ED Provider Notes (Signed)
CSN: [id number]     Arrival date & time 06/28/15  1737 History   First MD Initiated Contact with Patient 06/28/15 1911     Chief Complaint  Patient presents with  . Insect Bite   (Consider location/radiation/quality/duration/timing/severity/associated sxs/prior Treatment) Patient is a 65 y.o. male presenting with animal bite. The history is provided by the patient. No language interpreter was used.  Animal Bite Contact animal:  Insect Animal bite location: pulled a tick off his left foot yesterday morning. Time since incident:  1 day (He went fishing 2 days prior but did not notice the tick till the following day. He was able to remove the tick completely from his skin) Pain details:    Quality:  Itching   Severity:  Mild (more itching than painful)   Timing:  Constant   Progression:  Worsening Incident location:  Outside Provoked: unprovoked   Associated symptoms: swelling   Associated symptoms: no fever, no numbness and no rash     Past Medical History  Diagnosis Date  . Hypertension   . Hyperlipidemia   . Hydronephrosis   . Renal insufficiency   . Hemorrhoid   . Sleep apnea     No CPAP  . Hodgkin's lymphoma ([place])   . 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 H/O colonoscopy 2013  . CAD (coronary artery disease)   . TIA (transient ischemic attack) 2011  . H/O hiatal hernia   . History of colon polyps   . Nocturia   . 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 Myocardial infarction [facility]) 2011    "mild one, before the CABG"  . Hypothyroidism    Past Surgical History  Procedure Laterality Date  . Vasectomy  1980  . Bunionectomy Right 1978    foot  . Cardiac catheterization  10/15/2009  . Portacath placement  06/10/2011    Procedure: INSERTION PORT-A-CATH;  Surgeon: [name], MD;  Location: [facility];  Service: General;  Laterality: Left;  subclavian   . Lymph node biopsy  06/07/11    RIGHT INGUINAL NODE: CLASSICAL  HODGKIN'S LYMPHOMA, NODULAR SCLEROSIS TYPE  . Bone marrow aspirate,biopsy, and clot  06/11/11    LEFT ILIAC CREAST  . Tonsillectomy      as a child  . Coronary artery bypass graft  10/17/2009    LIMA to LAD,SVG to Ramus,SVG to OM sequential to OM2, SVG to marginal of RCA  . Colonoscopy    . Esophagogastroduodenoscopy    . Thyroidectomy  03/31/2012    Procedure: THYROIDECTOMY;  Surgeon: Izora Gala, MD;  Location: [place];  Service: ENT;  Laterality: N/A;  . Port-a-cath removal Left 12/06/2013    Procedure: REMOVAL PORT-A-CATH;  Surgeon: [name], MD;  Location: [place];  Service: General;  Laterality: Left;  . Left heart catheterization with coronary/graft angiogram N/A 10/03/2013    Procedure: LEFT HEART CATHETERIZATION WITH Beatrix Fetters;  Surgeon: Sinclair Grooms, MD;  Location: [facility] CATH LAB;  Service: Cardiovascular;  Laterality: N/A;   Family History  Problem Relation Age of Onset  . Arthritis Mother   . Hypertension Mother   . Diabetes Father   . Coronary artery disease Father 42    Died age 74  . Kidney disease Father   . Prostate cancer Paternal Uncle     Great  . Hypertension Sister   . Hypertension Sister     2nd Sister  .  Alcohol abuse Brother   . Cancer Neg Hx   . Drug abuse Neg Hx   . Early death Neg Hx   . Stroke Neg Hx    Social History  Substance Use Topics  . Smoking status: Never Smoker   . Smokeless tobacco: Never Used  . Alcohol Use: Yes     Comment: 10/02/2013 "couple beers; couple times/month"    Review of Systems  Constitutional: Negative for fever.  Respiratory: Negative.   Cardiovascular: Negative.   Skin: Positive for wound. Negative for rash.  Neurological: Negative for numbness.  All other systems reviewed and are negative.   Allergies  Crestor and Cialis  Home Medications   Prior to Admission medications   Medication Sig Start Date End Date Taking? Authorizing Provider  aspirin EC 81 MG tablet Take 81 mg  by mouth every evening.    Yes Historical Provider, MD  atorvastatin (LIPITOR) 80 MG tablet Take 80 mg by mouth every evening.   Yes Historical Provider, MD  levothyroxine (SYNTHROID, LEVOTHROID) 125 MCG tablet Take 1 tablet (125 mcg total) by mouth daily. 12/31/14  Yes Janith Lima, MD  metoprolol succinate (TOPROL-XL) 50 MG 24 hr tablet TAKE 1 TABLET BY MOUTH DAILY WITH OR IMMEDIATELY FOLLOWING A MEAL. 06/02/15  Yes Janith Lima, MD  Multiple Vitamin (MULTIVITAMIN WITH MINERALS) TABS tablet Take 1 tablet by mouth daily.   Yes Historical Provider, MD  nitroGLYCERIN (NITROSTAT) 0.4 MG SL tablet PLACE 1 TABLET UNDER THE TONGUE EVERY 5 MINUTES AS NEEDED FOR CHEST PAIN 05/03/14  Yes Burnell Blanks, MD   Meds Ordered and Administered this Visit  Medications - No data to display  BP 149/77 mmHg  Pulse 70  Temp(Src) 98 F (36.7 C) (Oral)  Resp 18  SpO2 100% No data found.   Physical Exam  Constitutional: He appears well-developed. No distress.  Cardiovascular: Normal rate, regular rhythm and normal heart sounds.   No murmur heard. Pulmonary/Chest: Effort normal and breath sounds normal. No respiratory distress. He has no wheezes.  Skin:     Nursing note and vitals reviewed.   ED Course  Procedures (including critical care time)  Labs Review Labs Reviewed - No data to display  Imaging Review No results found.   Visual Acuity Review  Right Eye Distance:   Left Eye Distance:   Bilateral Distance:    Right Eye Near:   Left Eye Near:    Bilateral Near:         MDM  No diagnosis found. Tick bite  Uncertain what type of tick it was. At the time of a tick bite, serologic testing is not helpful in establishing a new diagnosis of B. burgdorferi infection because antibodies to the spirochete have not yet appeared. I will treat with Doxy. Prescription given to continue A/B for 10 days. Instruction given to follow up with his PCP in 2 days for reassessment and further  management. He agreed with plan.    Kinnie Feil, MD 06/28/15 [phone]

## 2015-06-30 ENCOUNTER — Telehealth: Payer: Self-pay | Admitting: Cardiovascular Disease

## 2015-06-30 NOTE — Telephone Encounter (Signed)
I spoke with [facility] and they have all needed information. I spoke with pt and gave him this information. Pt reports he stopped Lipitor and leg weakness improved. Chart reviewed and if weakness improved after stopping Lipitor pt should not restart and would be referred to Lipid clinic to consider Praluent. .  I spoke with pt and gave him this information.  He would like to proceed with Lipid clinic referral.  Will have schedulers contact him regarding appt.

## 2015-06-30 NOTE — Telephone Encounter (Signed)
Walk In pt Form-Pt Needs Referral For [facility]-Gave to Pat/[place]

## 2015-07-01 ENCOUNTER — Encounter: Payer: Self-pay | Admitting: Family

## 2015-07-01 ENCOUNTER — Ambulatory Visit (INDEPENDENT_AMBULATORY_CARE_PROVIDER_SITE_OTHER): Payer: Managed Care, Other (non HMO) | Admitting: Family

## 2015-07-01 VITALS — BP 120/82 | HR 69 | Temp 97.9°F | Resp 14 | Ht 70.0 in | Wt 236.0 lb

## 2015-07-01 DIAGNOSIS — S91002A Unspecified open wound, left ankle, initial encounter: Secondary | ICD-10-CM

## 2015-07-01 DIAGNOSIS — S99912A Unspecified injury of left ankle, initial encounter: Secondary | ICD-10-CM | POA: Insufficient documentation

## 2015-07-01 NOTE — Assessment & Plan Note (Signed)
Wound appears with serous discharge and slightly reddened base. Continue current dosage of doxycycline. Keep clean with soap and water. Markings made as to current status. Follow up for signs of infection or worsening.

## 2015-07-01 NOTE — Patient Instructions (Signed)
Thank you for choosing [facility].  Summary/Instructions:  Please continue doxycycline as prescribed.  Keep the wound clean with soap and water.  Follow up if signs of infection develop - additional redness, swelling, colored discharge, or fevers.  If your symptoms worsen or fail to improve, please contact our office for further instruction, or in case of emergency go directly to the emergency room at the closest medical facility.

## 2015-07-01 NOTE — Progress Notes (Signed)
Pre visit review using our clinic review tool, if applicable. No additional management support is needed unless otherwise documented below in the visit note. 

## 2015-07-01 NOTE — Progress Notes (Signed)
Subjective:    Patient ID: Devon Brady, male    DOB: 06/28/1950, 65 y.o.   MRN: [id number]  Chief Complaint  Patient presents with  . Tick bite    was bit by a tick last thursday, got the tick out but had some swelling afterwards, went to Urgent care on saturday, does not feel like the bite has improved, it is draining     HPI:  Devon Brady is a 65 y.o. male who  has a past medical history of Hypertension; Hyperlipidemia; Hydronephrosis; Renal insufficiency; Hemorrhoid; Sleep apnea; Hodgkin's lymphoma ([place]); Neuropathy due to chemotherapeutic drug ([place]); H/O colonoscopy (2013); CAD (coronary artery disease); TIA (transient ischemic attack) (2011); H/O hiatal hernia; History of colon polyps; Nocturia; Papillary thyroid carcinoma ([place]) (03/2012); Myocardial infarction [facility]) (2011); and Hypothyroidism. and presents today for an acute office visit.   This is a new problem. Associated symptom of a tick bite located on his left ankle has been going on for about 4 days. Unsure of exact duration, however less than 24 hours total. Seen at Urgent Care and was placed on doxycycline. Reports taking the mediation as prescribed with no adverse side effects. Notes that the site is slowly getting bigger and does have some clear discharge. Modifying factors include benedryl cream which he has since stopped and now is using soap and water. Denies fevers, arthralgias, or flu-like symptoms.  Allergies  Allergen Reactions  . Crestor [Rosuvastatin]     Muscle aches  . Cialis [Tadalafil]     headache     Current Outpatient Prescriptions on File Prior to Visit  Medication Sig Dispense Refill  . aspirin EC 81 MG tablet Take 81 mg by mouth every evening.     Marland Kitchen doxycycline (VIBRAMYCIN) 100 MG capsule Take 1 capsule (100 mg total) by mouth 2 (two) times daily. 20 capsule 0  . levothyroxine (SYNTHROID, LEVOTHROID) 125 MCG tablet Take 1 tablet (125 mcg total) by mouth daily. 90 tablet 1  . metoprolol  succinate (TOPROL-XL) 50 MG 24 hr tablet TAKE 1 TABLET BY MOUTH DAILY WITH OR IMMEDIATELY FOLLOWING A MEAL. 30 tablet 5  . Multiple Vitamin (MULTIVITAMIN WITH MINERALS) TABS tablet Take 1 tablet by mouth daily.    . nitroGLYCERIN (NITROSTAT) 0.4 MG SL tablet PLACE 1 TABLET UNDER THE TONGUE EVERY 5 MINUTES AS NEEDED FOR CHEST PAIN 25 tablet 6  . [DISCONTINUED] citalopram (CELEXA) 20 MG tablet Take 1 tablet (20 mg total) by mouth daily. 30 tablet 1  . [DISCONTINUED] omeprazole (PRILOSEC OTC) 20 MG tablet Take 1 tablet (20 mg total) by mouth daily. 28 tablet 1   No current facility-administered medications on file prior to visit.     Past Surgical History  Procedure Laterality Date  . Vasectomy  1980  . Bunionectomy Right 1978    foot  . Cardiac catheterization  10/15/2009  . Portacath placement  06/10/2011    Procedure: INSERTION PORT-A-CATH;  Surgeon: [name], MD;  Location: [facility];  Service: General;  Laterality: Left;  subclavian   . Lymph node biopsy  06/07/11    RIGHT INGUINAL NODE: CLASSICAL HODGKIN'S LYMPHOMA, NODULAR SCLEROSIS TYPE  . Bone marrow aspirate,biopsy, and clot  06/11/11    LEFT ILIAC CREAST  . Tonsillectomy      as a child  . Coronary artery bypass graft  10/17/2009    LIMA to LAD,SVG to Ramus,SVG to OM sequential to OM2, SVG to marginal of RCA  . Colonoscopy    . Esophagogastroduodenoscopy    .  Thyroidectomy  03/31/2012    Procedure: THYROIDECTOMY;  Surgeon: Izora Gala, MD;  Location: [place];  Service: ENT;  Laterality: N/A;  . Port-a-cath removal Left 12/06/2013    Procedure: REMOVAL PORT-A-CATH;  Surgeon: [name], MD;  Location: [place];  Service: General;  Laterality: Left;  . Left heart catheterization with coronary/graft angiogram N/A 10/03/2013    Procedure: LEFT HEART CATHETERIZATION WITH Beatrix Fetters;  Surgeon: Sinclair Grooms, MD;  Location: [facility] CATH LAB;  Service: Cardiovascular;  Laterality: N/A;    Past Medical History   Diagnosis Date  . Hypertension   . Hyperlipidemia   . Hydronephrosis   . Renal insufficiency   . Hemorrhoid   . Sleep apnea     No CPAP  . Hodgkin's lymphoma ([place])   . 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 H/O colonoscopy 2013  . CAD (coronary artery disease)   . TIA (transient ischemic attack) 2011  . H/O hiatal hernia   . History of colon polyps   . Nocturia   . 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 Myocardial infarction [facility]) 2011    "mild one, before the CABG"  . Hypothyroidism     Review of Systems  Constitutional: Negative for fever, chills and unexpected weight change.  Respiratory: Negative for chest tightness and shortness of breath.   Cardiovascular: Negative for chest pain, palpitations and leg swelling.  Gastrointestinal: Negative for nausea, vomiting, diarrhea and constipation.  Musculoskeletal: Negative for arthralgias.  Skin: Positive for wound.      Objective:    BP 120/82 mmHg  Pulse 69  Temp(Src) 97.9 F (36.6 C) (Oral)  Resp 14  Ht 5\' 10"  (1.778 m)  Wt 236 lb (107.049 kg)  BMI 33.86 kg/m2  SpO2 98% Nursing note and vital signs reviewed.  Physical Exam  Constitutional: He is oriented to person, place, and time. He appears well-developed and well-nourished. No distress.  Cardiovascular: Normal rate, regular rhythm, normal heart sounds and intact distal pulses.   Pulmonary/Chest: Effort normal and breath sounds normal.  Neurological: He is alert and oriented to person, place, and time.  Skin: Skin is warm and dry.  3-4 cm annular, raised lesion with white center with slight red firm border around it.   Psychiatric: He has a normal mood and affect. His behavior is normal. Judgment and thought content normal.       Assessment & Plan:   Problem List Items Addressed This Visit      Other   Wound of left ankle - Primary    Wound appears with serous discharge and slightly reddened base.  Continue current dosage of doxycycline. Keep clean with soap and water. Markings made as to current status. Follow up for signs of infection or worsening.            I have discontinued Mr. Oftedahl's atorvastatin. I am also having him maintain his aspirin EC, multivitamin with minerals, nitroGLYCERIN, levothyroxine, metoprolol succinate, and doxycycline.    Follow-up: Return if symptoms worsen or fail to improve.  Mauricio Po, FNP

## 2015-07-04 ENCOUNTER — Ambulatory Visit (INDEPENDENT_AMBULATORY_CARE_PROVIDER_SITE_OTHER): Payer: Managed Care, Other (non HMO) | Admitting: Pharmacist

## 2015-07-04 DIAGNOSIS — E785 Hyperlipidemia, unspecified: Secondary | ICD-10-CM | POA: Diagnosis not present

## 2015-07-04 LAB — LIPID PANEL
CHOL/HDL RATIO: 5 ratio (ref <=5.0)
Cholesterol: 252 mg/dL — ABNORMAL HIGH (ref 125–200)
HDL: 50 mg/dL (ref >=40)
LDL Cholesterol: 185 mg/dL — ABNORMAL HIGH (ref <130)
TRIGLYCERIDES: 86 mg/dL (ref <150)
VLDL: 17 mg/dL (ref <30)

## 2015-07-04 MED ORDER — ROSUVASTATIN CALCIUM 10 MG PO TABS: 10.0000 mg | ORAL_TABLET | ORAL | Status: DC

## 2015-07-04 NOTE — Progress Notes (Addendum)
Patient ID: GEN CLAGG     DOB:02/21/1951        LXB:[id number]     HPI: Devon Brady is a 65 y.o. male patient referred to lipid clinic by Dr. Angelena Form. He has a PMH of CAD s/p CABG August 2011, NSTEMI 2015, HTN, hyperlipidemia, stage IV Hodgkins lymphoma, BPH, and hypothyroidism.   Today he reports that prior to his NSTEMI in 2015 he was taking rosuvastatin 20 mg daily.  He did stop it prior to the NSTEMI due to hand/wrist joint pain and the pain resolved upon discontinuation.  The joint pain was something that he had for several years but continued to worsen over time to the point of requiring discontinuation. Since 2015 he had been on atorvastatin 80 mg daily until 05/16/15 when he stopped it due to muscle weakness that made walking up stairs or getting out of a chair difficult.  This muscle weakness has not completely resolved since d/c of atorvastatin around 2 months ago. He does state that he can get lab work at [employer] through their wellness program to keep from having to miss work.   Current Medications: no lipid lowering medications Intolerances: rosuvastatin (hand/wrist joint pain which worsened after a few years of being on the med, pain resolved upon discontinuation), atorvastatin 80 mg (leg weakness with difficulty ambulating up steps and getting out of a chair, weakness has improved after he discontinued the lipitor but not completely revolved since discontinuation on 05/16/15) Risk Factors: CAD s/p CABG in 2011 LDL goal: <70 mg/dL  Diet:  Breakfast - veggie smoothie w/ spinach, kale, fruit, guacomole Dinner - brown rice, steamed cabbage, meatloaf, potatoes Drink - water, tea (half sugar), herbal teas, coffee.   Exercise: Has not been exercising due to starting a new job and remodeling his kitchen.   Family History: family history includes Alcohol abuse in his brother; Arthritis in his mother; Coronary artery disease (age of onset: 80) in his father;  Diabetes in his father; Hypertension in his mother, sister, and sister; Kidney disease in his father; Prostate cancer in his paternal uncle. There is no history of Cancer, Drug abuse, Early death, or Stroke.  Social History:  reports that he has never smoked. He has never used smokeless tobacco. He reports that he drinks alcohol. He reports that he does not use illicit drugs.  Labs: 01/31/2015: TC 149, TG 68, HDL 52, LDL 83 (on atorva 80 mg daily) 07/07/15: TC 252, TG 86, HDL 50, LDL 185 (on no lipid lowering medications)  Hepatic Function Latest Ref Rng 01/31/2015 11/07/2014 11/06/2014  Total Protein 6.1 - 8.1 g/dL 7.2 7.2 6.9  Albumin 3.6 - 5.1 g/dL 4.2 4.2 3.8  AST 10 - 35 U/L '19 15 15  '$ ALT 9 - 46 U/L '14 10 13  '$ Alk Phosphatase 40 - 115 U/L 92 70 99  Total Bilirubin 0.2 - 1.2 mg/dL 0.5 0.5 0.36  Bilirubin, Direct <=0.2 mg/dL 0.1 0.1 -      Past Medical History  Diagnosis Date  . Hypertension   . Hyperlipidemia   . Hydronephrosis   . Renal insufficiency   . Hemorrhoid   . Sleep apnea     No CPAP  . Hodgkin's lymphoma ([place])   . Neuropathy due to chemotherapeutic drug ([place])     GRADE 2  . H/O colonoscopy 2013  . CAD (coronary artery disease)   . TIA (transient ischemic attack) 2011  . H/O hiatal hernia   . History of colon  polyps   . Nocturia   . 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 Myocardial infarction [facility]) 2011    "mild one, before the CABG"  . Hypothyroidism     Current Outpatient Prescriptions on File Prior to Visit  Medication Sig Dispense Refill  . aspirin EC 81 MG tablet Take 81 mg by mouth every evening.     Marland Kitchen doxycycline (VIBRAMYCIN) 100 MG capsule Take 1 capsule (100 mg total) by mouth 2 (two) times daily. 20 capsule 0  . levothyroxine (SYNTHROID, LEVOTHROID) 125 MCG tablet Take 1 tablet (125 mcg total) by mouth daily. 90 tablet 1  . metoprolol succinate (TOPROL-XL) 50 MG 24 hr tablet TAKE 1 TABLET BY MOUTH DAILY WITH OR  IMMEDIATELY FOLLOWING A MEAL. 30 tablet 5  . Multiple Vitamin (MULTIVITAMIN WITH MINERALS) TABS tablet Take 1 tablet by mouth daily.    . nitroGLYCERIN (NITROSTAT) 0.4 MG SL tablet PLACE 1 TABLET UNDER THE TONGUE EVERY 5 MINUTES AS NEEDED FOR CHEST PAIN 25 tablet 6  . [DISCONTINUED] citalopram (CELEXA) 20 MG tablet Take 1 tablet (20 mg total) by mouth daily. 30 tablet 1  . [DISCONTINUED] omeprazole (PRILOSEC OTC) 20 MG tablet Take 1 tablet (20 mg total) by mouth daily. 28 tablet 1   No current facility-administered medications on file prior to visit.    Allergies  Allergen Reactions  . Crestor [Rosuvastatin]     Muscle aches  . Cialis [Tadalafil]     headache    Assessment/Plan:  1. Hyperlipidemia: Above LDL goal of <70 at 185 today on no lipid lowering medications. Patient currently not taking any cholesterol lowering medications and has a hx of myalgia/arthralgias with atorvastatin 80 mg daily and rosuvastatin 20 mg daily.  Discussed benefits/efficacy, MOA, cost, and side effects of statins vs PCSK9 inhibitors vs ezetimibe. Patient was willing to retrial lower dose of Crestor.  Will start rosuvastatin 10 mg three times per week.  Will start ezetimibe 10 mg daily in 2 weeks to allow patient time to gauge toleration of the rosuvastatin. Instructed pt to call clinic if he experienced any muscle/joint pain. Will plan to follow-up fasting lipid panel at patients work [employer] through their employee wellness program in 2-3 months.  Called patient to notify him of starting ezetimibe in addition to his rosuvastatin.  Bennye Alm, PharmD Pharmacy Resident

## 2015-07-04 NOTE — Patient Instructions (Signed)
Thank you for coming in today  Please start taking Crestor (rosuvastatin) 10 mg three times a week.  If you do have any muscle pain or joint pain please call our clinic at [phone].

## 2015-07-07 MED ORDER — EZETIMIBE 10 MG PO TABS: 10.0000 mg | ORAL_TABLET | Freq: Every day | ORAL | Status: DC

## 2015-07-07 NOTE — Addendum Note (Signed)
Addended by: Kem Parkinson on: 07/07/2015 08:57 AM   Modules accepted: Orders

## 2015-08-05 LAB — PSA: PSA: 1.78

## 2015-10-27 ENCOUNTER — Encounter: Payer: Self-pay | Admitting: Cardiovascular Disease

## 2015-10-31 ENCOUNTER — Other Ambulatory Visit (HOSPITAL_BASED_OUTPATIENT_CLINIC_OR_DEPARTMENT_OTHER): Payer: Managed Care, Other (non HMO)

## 2015-10-31 ENCOUNTER — Ambulatory Visit (HOSPITAL_BASED_OUTPATIENT_CLINIC_OR_DEPARTMENT_OTHER): Payer: Managed Care, Other (non HMO) | Admitting: Hematology and Oncology

## 2015-10-31 ENCOUNTER — Encounter: Payer: Self-pay | Admitting: Hematology and Oncology

## 2015-10-31 ENCOUNTER — Telehealth: Payer: Self-pay | Admitting: Hematology and Oncology

## 2015-10-31 VITALS — BP 129/88 | HR 74 | Temp 97.8°F | Resp 18 | Ht 70.0 in | Wt 241.0 lb

## 2015-10-31 DIAGNOSIS — C73 Malignant neoplasm of thyroid gland: Secondary | ICD-10-CM

## 2015-10-31 DIAGNOSIS — Z8571 Personal history of Hodgkin lymphoma: Secondary | ICD-10-CM

## 2015-10-31 DIAGNOSIS — Z85850 Personal history of malignant neoplasm of thyroid: Secondary | ICD-10-CM

## 2015-10-31 DIAGNOSIS — E669 Obesity, unspecified: Secondary | ICD-10-CM | POA: Diagnosis not present

## 2015-10-31 DIAGNOSIS — IMO0001 Reserved for inherently not codable concepts without codable children: Secondary | ICD-10-CM

## 2015-10-31 DIAGNOSIS — C8190 Hodgkin lymphoma, unspecified, unspecified site: Secondary | ICD-10-CM

## 2015-10-31 DIAGNOSIS — Z6839 Body mass index (BMI) 39.0-39.9, adult: Secondary | ICD-10-CM | POA: Diagnosis not present

## 2015-10-31 LAB — COMPREHENSIVE METABOLIC PANEL
ALBUMIN: 3.7 g/dL (ref 3.5–5.0)
ALT: 13 U/L (ref 0–55)
AST: 17 U/L (ref 5–34)
Alkaline Phosphatase: 75 U/L (ref 40–150)
Anion Gap: 8 mEq/L (ref 3–11)
BUN: 18.9 mg/dL (ref 7.0–26.0)
CALCIUM: 9.6 mg/dL (ref 8.4–10.4)
CHLORIDE: 112 meq/L — AB (ref 98–109)
CO2: 23 mEq/L (ref 22–29)
CREATININE: 1.6 mg/dL — AB (ref 0.7–1.3)
EGFR: 52 mL/min/{1.73_m2} — ABNORMAL LOW (ref >90)
GLUCOSE: 107 mg/dL (ref 70–140)
POTASSIUM: 4.3 meq/L (ref 3.5–5.1)
Sodium: 143 mEq/L (ref 136–145)
TOTAL PROTEIN: 7.3 g/dL (ref 6.4–8.3)
Total Bilirubin: 0.39 mg/dL (ref 0.20–1.20)

## 2015-10-31 LAB — CBC WITH DIFFERENTIAL/PLATELET
BASO%: 0.5 % (ref 0.0–2.0)
Basophils Absolute: 0 10*3/uL (ref 0.0–0.1)
EOS ABS: 0.2 10*3/uL (ref 0.0–0.5)
EOS%: 2.7 % (ref 0.0–7.0)
HCT: 49.1 % (ref 38.4–49.9)
HGB: 16.2 g/dL (ref 13.0–17.1)
LYMPH%: 32.2 % (ref 14.0–49.0)
MCH: 28.7 pg (ref 27.2–33.4)
MCHC: 33 g/dL (ref 32.0–36.0)
MCV: 87 fL (ref 79.3–98.0)
MONO#: 0.5 10*3/uL (ref 0.1–0.9)
MONO%: 7.5 % (ref 0.0–14.0)
NEUT%: 57.1 % (ref 39.0–75.0)
NEUTROS ABS: 3.5 10*3/uL (ref 1.5–6.5)
PLATELETS: 187 10*3/uL (ref 140–400)
RBC: 5.65 10*6/uL (ref 4.20–5.82)
RDW: 14.6 % (ref 11.0–14.6)
WBC: 6 10*3/uL (ref 4.0–10.3)
lymph#: 1.9 10*3/uL (ref 0.9–3.3)

## 2015-10-31 LAB — LACTATE DEHYDROGENASE: LDH: 183 U/L (ref 125–245)

## 2015-10-31 NOTE — Assessment & Plan Note (Signed)
He has no signs of disease recurrence. Clinical exam, blood work and imaging study were normal. I will see him once a year with history, physical examination and blood work.   

## 2015-10-31 NOTE — Assessment & Plan Note (Signed)
He has no signs of disease recurrence. Recent thyroid scan was negative. Defer follow-up to his endocrinologist

## 2015-10-31 NOTE — Assessment & Plan Note (Signed)
The patient is obese with significant cardiovascular disease & risk factors. He appears to be motivated to lose weight and make the commitment to lose 30 pounds before I see him next time.

## 2015-10-31 NOTE — Telephone Encounter (Signed)
Gave pt cal & avs °

## 2015-10-31 NOTE — Progress Notes (Signed)
[name] OFFICE PROGRESS NOTE  Patient Care Team: Janith Lima, MD as PCP - General (Internal Medicine) Corliss Parish, MD as Consulting Physician (Nephrology) Jacelyn Pi, MD as Consulting Physician (Endocrinology) Izora Gala, MD as Consulting Physician (Otolaryngology) Clarene Essex, MD as Consulting Physician (Gastroenterology) Heath Lark, MD as Consulting Physician (Hematology and Oncology)  SUMMARY OF ONCOLOGIC HISTORY: Oncology History   Hodgkin's lymphoma, nodular sclerosing   Primary site: Lymphoid Neoplasms (Bilateral)   Clinical: Stage IV signed by Heath Lark, MD on 05/07/2013  3:56 PM   Pathologic: Stage IV signed by Heath Lark, MD on 05/07/2013  3:56 PM   Summary: Stage IV Primary thyroid papillary carcinoma   Primary site: Thyroid - Papillary or Follicular (45 years and older) (Left)   Staging method: AJCC 7th Edition   Clinical: Stage I (T1, N0, M0) signed by Heath Lark, MD on 05/07/2013  3:48 PM   Pathologic: Stage I (T1, N0, cM0) signed by Heath Lark, MD on 05/07/2013  3:48 PM   Summary: Stage I (T1, N0, cM0)       History of hodgkin's lymphoma   05/28/2011 Imaging    CT scan of the chest, abdomen and pelvis shows significant and diffuse lymphadenopathy      06/07/2011 Surgery    Lymph node biopsy from the right inguinal region came back positive for nodular sclerosing Hodgkin lymphoma      06/11/2011 - 11/12/2011 Chemotherapy    The patient received treatment with ABVD for Hodgkin's lymphoma. 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       06/11/2011 Imaging    PET CT scan show bilateral cervical, axillary, inguinal, retroperitoneal lymphadenopathy and bone involvement      06/11/2011 Bone Marrow Biopsy    Bone marrow biopsy was negative for recurrence      08/05/2011 Imaging    Repeat PET scan show marked reduction in the lymphadenopathy      11/25/2011  Imaging    PET CT scan show almost near complete resolution of disease      03/31/2012 Surgery    The patient underwent thyroid resection which show papillary thyroid cancer      05/15/2012 Imaging    Repeat CT scan show complete resolution of all disease      08/30/2012 - 08/30/2012 Radiation Therapy    The patient received radioactive iodine treatment for thyroid cancer      11/17/2012 Imaging    Repeat CT scan showed complete resolution of all disease      05/07/2013 Imaging    Repeat CT scan show no evidence of disease recurrence       INTERVAL HISTORY: Please see below for problem oriented charting. He returns for follow-up. Denies new lymphadenopathy. He has gained some weight since I saw him but appears motivated to exercise regularly. Denies recent infection   REVIEW OF SYSTEMS:   Constitutional: Denies fevers, chills or abnormal weight loss Eyes: Denies blurriness of vision Ears, nose, mouth, throat, and face: Denies mucositis or sore throat Respiratory: Denies cough, dyspnea or wheezes Cardiovascular: Denies palpitation, chest discomfort or lower extremity swelling Gastrointestinal:  Denies nausea, heartburn or change in bowel habits Skin: Denies abnormal skin rashes Lymphatics: Denies new lymphadenopathy or easy bruising Neurological:Denies numbness, tingling or new weaknesses Behavioral/Psych: Mood is stable, no new changes  All other systems were reviewed with the patient and are negative.  I have reviewed the past medical  history, past surgical history, social history and family history with the patient and they are unchanged from previous note.  ALLERGIES:  is allergic to crestor [rosuvastatin] and cialis [tadalafil].  MEDICATIONS:  Current Outpatient Prescriptions  Medication Sig Dispense Refill  . aspirin EC 81 MG tablet Take 81 mg by mouth every evening.     . ezetimibe (ZETIA) 10 MG tablet Take 1 tablet (10 mg total) by mouth daily. 90 tablet 3  .  metoprolol succinate (TOPROL-XL) 50 MG 24 hr tablet TAKE 1 TABLET BY MOUTH DAILY WITH OR IMMEDIATELY FOLLOWING A MEAL. 30 tablet 5  . Multiple Vitamin (MULTIVITAMIN WITH MINERALS) TABS tablet Take 1 tablet by mouth daily.    . nitroGLYCERIN (NITROSTAT) 0.4 MG SL tablet PLACE 1 TABLET UNDER THE TONGUE EVERY 5 MINUTES AS NEEDED FOR CHEST PAIN 25 tablet 6  . rosuvastatin (CRESTOR) 10 MG tablet Take 1 tablet (10 mg total) by mouth 3 (three) times a week. 15 tablet 3  . levothyroxine (SYNTHROID, LEVOTHROID) 175 MCG tablet Take 175 mcg by mouth daily.  4   No current facility-administered medications for this visit.     PHYSICAL EXAMINATION: ECOG PERFORMANCE STATUS: 0 - Asymptomatic  Vitals:   10/31/15 0841  BP: 129/88  Pulse: 74  Resp: 18  Temp: 97.8 F (36.6 C)   Filed Weights   10/31/15 0841  Weight: 241 lb (109.3 kg)    GENERAL:alert, no distress and comfortable. He is obese SKIN: skin color, texture, turgor are normal, no rashes or significant lesions EYES: normal, Conjunctiva are pink and non-injected, sclera clear OROPHARYNX:no exudate, no erythema and lips, buccal mucosa, and tongue normal  NECK: supple, thyroid normal size, non-tender, without nodularity LYMPH:  no palpable lymphadenopathy in the cervical, axillary or inguinal LUNGS: clear to auscultation and percussion with normal breathing effort HEART: regular rate & rhythm and no murmurs and no lower extremity edema ABDOMEN:abdomen soft, non-tender and normal bowel sounds Musculoskeletal:no cyanosis of digits and no clubbing  NEURO: alert & oriented x 3 with fluent speech, no focal motor/sensory deficits  LABORATORY DATA:  I have reviewed the data as listed    Component Value Date/Time   NA 143 10/31/2015 0827   K 4.3 10/31/2015 0827   CL 107 12/31/2014 0926   CL 106 05/15/2012 0815   CO2 23 10/31/2015 0827   GLUCOSE 107 10/31/2015 0827   GLUCOSE 106 (H) 05/15/2012 0815   BUN 18.9 10/31/2015 0827   CREATININE  1.6 (H) 10/31/2015 0827   CALCIUM 9.6 10/31/2015 0827   PROT 7.3 10/31/2015 0827   ALBUMIN 3.7 10/31/2015 0827   AST 17 10/31/2015 0827   ALT 13 10/31/2015 0827   ALKPHOS 75 10/31/2015 0827   BILITOT 0.39 10/31/2015 0827   GFRNONAA 56 (L) 10/04/2013 0800   GFRAA 65 (L) 10/04/2013 0800    No results found for: SPEP, UPEP  Lab Results  Component Value Date   WBC 6.0 10/31/2015   NEUTROABS 3.5 10/31/2015   HGB 16.2 10/31/2015   HCT 49.1 10/31/2015   MCV 87.0 10/31/2015   PLT 187 10/31/2015      Chemistry      Component Value Date/Time   NA 143 10/31/2015 0827   K 4.3 10/31/2015 0827   CL 107 12/31/2014 0926   CL 106 05/15/2012 0815   CO2 23 10/31/2015 0827   BUN 18.9 10/31/2015 0827   CREATININE 1.6 (H) 10/31/2015 0827      Component Value Date/Time   CALCIUM 9.6 10/31/2015  0827   ALKPHOS 75 10/31/2015 0827   AST 17 10/31/2015 0827   ALT 13 10/31/2015 0827   BILITOT 0.39 10/31/2015 0827      ASSESSMENT & PLAN:  History of hodgkin's lymphoma He has no signs of disease recurrence. Clinical exam, blood work and imaging study were normal. I will see him once a year with history, physical examination and blood work.  Primary thyroid papillary carcinoma He has no signs of disease recurrence. Recent thyroid scan was negative. Defer follow-up to his endocrinologist  Obesity, Class II, BMI 35-39.9, with comorbidity ([place]) The patient is obese with significant cardiovascular disease & risk factors. He appears to be motivated to lose weight and make the commitment to lose 30 pounds before I see him next time.   Orders Placed This Encounter  Procedures  . CBC with Differential/Platelet    Standing Status:   Future    Standing Expiration Date:   12/04/2016  . Comprehensive metabolic panel    Standing Status:   Future    Standing Expiration Date:   12/04/2016  . Lactate dehydrogenase (LDH)    Standing Status:   Future    Standing Expiration Date:   10/30/2016   All  questions were answered. The patient knows to call the clinic with any problems, questions or concerns. No barriers to learning was detected. I spent 15 minutes counseling the patient face to face. The total time spent in the appointment was 20 minutes and more than 50% was on counseling and review of test results     [facility], [place], MD 10/31/2015 9:25 AM

## 2015-11-22 ENCOUNTER — Other Ambulatory Visit: Payer: Self-pay | Admitting: Cardiovascular Disease

## 2015-12-12 LAB — HM DIABETES EYE EXAM

## 2015-12-15 ENCOUNTER — Encounter: Payer: Self-pay | Admitting: Internal Medicine

## 2015-12-15 NOTE — Progress Notes (Signed)
0929 

## 2015-12-29 ENCOUNTER — Encounter (INDEPENDENT_AMBULATORY_CARE_PROVIDER_SITE_OTHER): Payer: Managed Care, Other (non HMO) | Admitting: Ophthalmology

## 2015-12-29 DIAGNOSIS — I10 Essential (primary) hypertension: Secondary | ICD-10-CM

## 2015-12-29 DIAGNOSIS — H35371 Puckering of macula, right eye: Secondary | ICD-10-CM | POA: Diagnosis not present

## 2015-12-29 DIAGNOSIS — H43813 Vitreous degeneration, bilateral: Secondary | ICD-10-CM | POA: Diagnosis not present

## 2015-12-29 DIAGNOSIS — H35033 Hypertensive retinopathy, bilateral: Secondary | ICD-10-CM | POA: Diagnosis not present

## 2015-12-31 ENCOUNTER — Other Ambulatory Visit (INDEPENDENT_AMBULATORY_CARE_PROVIDER_SITE_OTHER): Payer: Managed Care, Other (non HMO)

## 2015-12-31 ENCOUNTER — Encounter: Payer: Self-pay | Admitting: Internal Medicine

## 2015-12-31 ENCOUNTER — Ambulatory Visit (INDEPENDENT_AMBULATORY_CARE_PROVIDER_SITE_OTHER)
Admission: RE | Admit: 2015-12-31 | Discharge: 2015-12-31 | Disposition: A | Discharge status: Home / Self Care | Payer: Managed Care, Other (non HMO) | Source: Ambulatory Visit | Attending: Internal Medicine | Admitting: Internal Medicine

## 2015-12-31 ENCOUNTER — Ambulatory Visit (INDEPENDENT_AMBULATORY_CARE_PROVIDER_SITE_OTHER): Payer: Managed Care, Other (non HMO) | Admitting: Internal Medicine

## 2015-12-31 VITALS — BP 122/84 | HR 69 | Temp 97.9°F | Resp 16 | Ht 70.0 in | Wt 237.5 lb

## 2015-12-31 DIAGNOSIS — E1121 Type 2 diabetes mellitus with diabetic nephropathy: Secondary | ICD-10-CM | POA: Diagnosis not present

## 2015-12-31 DIAGNOSIS — E890 Postprocedural hypothyroidism: Secondary | ICD-10-CM

## 2015-12-31 DIAGNOSIS — Z0000 Encounter for general adult medical examination without abnormal findings: Secondary | ICD-10-CM

## 2015-12-31 DIAGNOSIS — S99912A Unspecified injury of left ankle, initial encounter: Secondary | ICD-10-CM

## 2015-12-31 DIAGNOSIS — N289 Disorder of kidney and ureter, unspecified: Secondary | ICD-10-CM | POA: Diagnosis not present

## 2015-12-31 LAB — URINALYSIS, ROUTINE W REFLEX MICROSCOPIC
Bilirubin Urine: NEGATIVE
Ketones, ur: NEGATIVE
Leukocytes, UA: NEGATIVE
Nitrite: NEGATIVE
PH: 6 (ref 5.0–8.0)
SPECIFIC GRAVITY, URINE: 1.02 (ref 1.000–1.030)
Urine Glucose: NEGATIVE
Urobilinogen, UA: 0.2 (ref 0.0–1.0)

## 2015-12-31 LAB — TSH: TSH: 0.05 u[IU]/mL — ABNORMAL LOW (ref 0.35–4.50)

## 2015-12-31 LAB — LIPID PANEL
CHOL/HDL RATIO: 4
Cholesterol: 172 mg/dL (ref 0–200)
HDL: 46.8 mg/dL (ref >39.00)
LDL CALC: 110 mg/dL — AB (ref 0–99)
NonHDL: 125.21
TRIGLYCERIDES: 77 mg/dL (ref 0.0–149.0)
VLDL: 15.4 mg/dL (ref 0.0–40.0)

## 2015-12-31 LAB — HEMOGLOBIN A1C: Hgb A1c MFr Bld: 6.3 % (ref 4.6–6.5)

## 2015-12-31 LAB — MICROALBUMIN / CREATININE URINE RATIO
CREATININE, U: 155.7 mg/dL
MICROALB UR: 13.7 mg/dL — AB (ref 0.0–1.9)
MICROALB/CREAT RATIO: 8.8 mg/g (ref 0.0–30.0)

## 2015-12-31 NOTE — Progress Notes (Signed)
Pre visit review using our clinic review tool, if applicable. No additional management support is needed unless otherwise documented below in the visit note. 

## 2015-12-31 NOTE — Progress Notes (Signed)
Subjective:  Patient ID: Devon Brady, male    DOB: Mar 18, 1950  Age: 65 y.o. MRN: [id number]  CC: Annual Exam   HPI Devon Brady presents for a CPX.  He complains of mild left ankle pain and swelling for several months after he stepped in a hole and twisted his ankle. He has not sought any treatment prior to today. He has not taken anything for the pain and he does not want anything for pain.  He feels like his thyroid dose is at an adequate level. He has had no recent episodes of weight changes, fatigue, edema, palpitations, or shortness of breath.  He is tolerating his cholesterol medicine well with no muscle or joint aches.  Outpatient Medications Prior to Visit  Medication Sig Dispense Refill  . aspirin EC 81 MG tablet Take 81 mg by mouth every evening.     . ezetimibe (ZETIA) 10 MG tablet Take 1 tablet (10 mg total) by mouth daily. 90 tablet 3  . levothyroxine (SYNTHROID, LEVOTHROID) 175 MCG tablet Take 175 mcg by mouth daily.  4  . metoprolol succinate (TOPROL-XL) 50 MG 24 hr tablet TAKE 1 TABLET BY MOUTH DAILY WITH OR IMMEDIATELY FOLLOWING A MEAL. 30 tablet 5  . rosuvastatin (CRESTOR) 10 MG tablet TAKE 1 TABLET BY MOUTH 3 TIMES A WEEK. 15 tablet 3  . nitroGLYCERIN (NITROSTAT) 0.4 MG SL tablet PLACE 1 TABLET UNDER THE TONGUE EVERY 5 MINUTES AS NEEDED FOR CHEST PAIN (Patient not taking: Reported on 12/31/2015) 25 tablet 6  . Multiple Vitamin (MULTIVITAMIN WITH MINERALS) TABS tablet Take 1 tablet by mouth daily.     No facility-administered medications prior to visit.     ROS Review of Systems  Constitutional: Negative.  Negative for activity change, appetite change, diaphoresis, fatigue and fever.  HENT: Negative.  Negative for trouble swallowing.   Eyes: Negative.  Negative for visual disturbance.  Respiratory: Negative.  Negative for cough, choking, chest tightness, shortness of breath and stridor.   Cardiovascular: Negative for chest pain, palpitations and leg  swelling.  Gastrointestinal: Negative.  Negative for abdominal pain, constipation, diarrhea, nausea and vomiting.  Endocrine: Negative.   Genitourinary: Negative.  Negative for difficulty urinating, dysuria, frequency, hematuria, scrotal swelling and testicular pain.  Musculoskeletal: Positive for arthralgias. Negative for back pain, joint swelling, myalgias and neck pain.  Skin: Negative.  Negative for color change and rash.  Allergic/Immunologic: Negative.   Neurological: Negative.  Negative for dizziness, tremors, syncope, weakness, light-headedness, numbness and headaches.  Hematological: Negative for adenopathy. Does not bruise/bleed easily.  Psychiatric/Behavioral: Negative.     Objective:  BP 122/84 (BP Location: Left Arm, Patient Position: Sitting, Cuff Size: Large)   Pulse 69   Temp 97.9 F (36.6 C) (Oral)   Resp 16   Ht 5\' 10"  (1.778 m)   Wt 237 lb 8 oz (107.7 kg)   SpO2 97%   BMI 34.08 kg/m   BP Readings from Last 3 Encounters:  12/31/15 122/84  10/31/15 129/88  07/01/15 120/82    Wt Readings from Last 3 Encounters:  12/31/15 237 lb 8 oz (107.7 kg)  10/31/15 241 lb (109.3 kg)  07/01/15 236 lb (107 kg)    Physical Exam  Constitutional: He is oriented to person, place, and time. He appears well-developed and well-nourished. No distress.  HENT:  Head: Normocephalic and atraumatic.  Mouth/Throat: Oropharynx is clear and moist. No oropharyngeal exudate.  Eyes: Conjunctivae are normal. Right eye exhibits no discharge. Left eye exhibits no discharge.  No scleral icterus.  Neck: Normal range of motion. Neck supple. No JVD present. No tracheal deviation present. No thyromegaly present.  Cardiovascular: Normal rate, regular rhythm, normal heart sounds and intact distal pulses.  Exam reveals no gallop and no friction rub.   No murmur heard. Pulmonary/Chest: Effort normal and breath sounds normal. No stridor. No respiratory distress. He has no wheezes. He has no rales. He  exhibits no tenderness.  Abdominal: Soft. Bowel sounds are normal. He exhibits no distension and no mass. There is no tenderness. There is no rebound and no guarding. Hernia confirmed negative in the right inguinal area and confirmed negative in the left inguinal area.  Genitourinary: Prostate normal, testes normal and penis normal. Rectal exam shows internal hemorrhoid. Rectal exam shows no external hemorrhoid, no fissure, no mass, no tenderness, anal tone normal and guaiac negative stool. Prostate is not enlarged and not tender. Right testis shows no mass, no swelling and no tenderness. Right testis is descended. Left testis shows no mass, no swelling and no tenderness. Left testis is descended. Circumcised. No penile erythema or penile tenderness. No discharge found.  Musculoskeletal: Normal range of motion. He exhibits no edema, tenderness or deformity.       Left ankle: Normal. He exhibits normal range of motion, no swelling, no ecchymosis, no deformity, no laceration and normal pulse. No tenderness. No lateral malleolus and no medial malleolus tenderness found. Achilles tendon normal. Achilles tendon exhibits no pain, no defect and normal Thompson's test results.  Lymphadenopathy:    He has no cervical adenopathy.       Right: No inguinal adenopathy present.       Left: No inguinal adenopathy present.  Neurological: He is oriented to person, place, and time.  Skin: Skin is warm and dry. No rash noted. He is not diaphoretic. No erythema. No pallor.  Psychiatric: He has a normal mood and affect. His behavior is normal. Judgment and thought content normal.  Vitals reviewed.   Lab Results  Component Value Date   WBC 6.0 10/31/2015   HGB 16.2 10/31/2015   HCT 49.1 10/31/2015   PLT 187 10/31/2015   GLUCOSE 107 10/31/2015   CHOL 172 12/31/2015   TRIG 77.0 12/31/2015   HDL 46.80 12/31/2015   LDLCALC 110 (H) 12/31/2015   ALT 13 10/31/2015   AST 17 10/31/2015   NA 143 10/31/2015   K 4.3  10/31/2015   CL 107 12/31/2014   CREATININE 1.6 (H) 10/31/2015   BUN 18.9 10/31/2015   CO2 23 10/31/2015   TSH 0.05 (L) 12/31/2015   PSA 1.78 08/05/2015   INR 0.97 10/02/2013   HGBA1C 6.3 12/31/2015   MICROALBUR 13.7 (H) 12/31/2015    No results found.  Assessment & Plan:   Nichlaus was seen today for annual exam.  Diagnoses and all orders for this visit:  Routine general medical examination at a health care facility- Exam completed, labs ordered and reviewed, his PSA was normal earlier this year, vaccines reviewed and updated, his colonoscopy is up-to-date, patient education material was given. 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 -  Hemoglobin A1c; Future -     Microalbumin / creatinine urine ratio; Future  Hypothyroidism, postsurgical- his TSH is suppressed so I have asked him to decrease his levothyroxine dose from 175 g to 150 g.  Renal insufficiency- his renal function is stable, will continue to maintain good blood pressure control and he will avoid nephrotoxic agents  Left ankle injury, initial encounter- plain film shows mild soft tissue swelling, he will let me know if he wants to see someone about having a chronic ankle sprain treated -     DG Ankle Complete Left; Future   I have discontinued Mr. Zwiefelhofer's multivitamin with minerals. I am also having him maintain his aspirin EC, nitroGLYCERIN, metoprolol succinate, ezetimibe, levothyroxine, and rosuvastatin.  No orders of the defined types were placed in this encounter.    Follow-up: Return if symptoms worsen or fail to improve.  Scarlette Calico, MD

## 2015-12-31 NOTE — Patient Instructions (Signed)

## 2016-01-01 ENCOUNTER — Encounter: Payer: Self-pay | Admitting: Internal Medicine

## 2016-01-01 LAB — HEPATITIS C ANTIBODY: HCV Ab: NEGATIVE

## 2016-01-01 LAB — HIV ANTIBODY (ROUTINE TESTING W REFLEX): HIV 1&2 Ab, 4th Generation: NONREACTIVE

## 2016-01-04 MED ORDER — LEVOTHYROXINE SODIUM 150 MCG PO TABS: 150.0000 ug | ORAL_TABLET | Freq: Every day | ORAL | 1 refills | Status: DC

## 2016-02-13 ENCOUNTER — Other Ambulatory Visit: Payer: Self-pay | Admitting: Cardiovascular Disease

## 2016-02-20 LAB — HM COLONOSCOPY

## 2016-02-24 ENCOUNTER — Other Ambulatory Visit: Payer: Self-pay | Admitting: Cardiovascular Disease

## 2016-03-31 ENCOUNTER — Emergency Department (HOSPITAL_COMMUNITY)
Admission: EM | Admit: 2016-03-31 | Discharge: 2016-03-31 | Disposition: A | Discharge status: Home / Self Care | Payer: Managed Care, Other (non HMO) | Attending: Emergency Medicine | Admitting: Emergency Medicine

## 2016-03-31 ENCOUNTER — Emergency Department (HOSPITAL_COMMUNITY): Payer: Managed Care, Other (non HMO)

## 2016-03-31 ENCOUNTER — Encounter (HOSPITAL_COMMUNITY): Payer: Self-pay | Admitting: *Deleted

## 2016-03-31 DIAGNOSIS — Z8673 Personal history of transient ischemic attack (TIA), and cerebral infarction without residual deficits: Secondary | ICD-10-CM | POA: Insufficient documentation

## 2016-03-31 DIAGNOSIS — Z7982 Long term (current) use of aspirin: Secondary | ICD-10-CM | POA: Insufficient documentation

## 2016-03-31 DIAGNOSIS — E039 Hypothyroidism, unspecified: Secondary | ICD-10-CM | POA: Insufficient documentation

## 2016-03-31 DIAGNOSIS — Z85850 Personal history of malignant neoplasm of thyroid: Secondary | ICD-10-CM | POA: Diagnosis not present

## 2016-03-31 DIAGNOSIS — R69 Illness, unspecified: Secondary | ICD-10-CM

## 2016-03-31 DIAGNOSIS — Z79899 Other long term (current) drug therapy: Secondary | ICD-10-CM | POA: Insufficient documentation

## 2016-03-31 DIAGNOSIS — I10 Essential (primary) hypertension: Secondary | ICD-10-CM | POA: Diagnosis not present

## 2016-03-31 DIAGNOSIS — E119 Type 2 diabetes mellitus without complications: Secondary | ICD-10-CM | POA: Diagnosis not present

## 2016-03-31 DIAGNOSIS — I2510 Atherosclerotic heart disease of native coronary artery without angina pectoris: Secondary | ICD-10-CM | POA: Insufficient documentation

## 2016-03-31 DIAGNOSIS — I252 Old myocardial infarction: Secondary | ICD-10-CM | POA: Insufficient documentation

## 2016-03-31 DIAGNOSIS — J111 Influenza due to unidentified influenza virus with other respiratory manifestations: Secondary | ICD-10-CM | POA: Insufficient documentation

## 2016-03-31 DIAGNOSIS — R52 Pain, unspecified: Secondary | ICD-10-CM | POA: Diagnosis present

## 2016-03-31 LAB — CBC WITH DIFFERENTIAL/PLATELET
BASOS PCT: 0 %
Basophils Absolute: 0 10*3/uL (ref 0.0–0.1)
EOS ABS: 0 10*3/uL (ref 0.0–0.7)
EOS PCT: 0 %
HCT: 49.7 % (ref 39.0–52.0)
Hemoglobin: 16.9 g/dL (ref 13.0–17.0)
LYMPHS ABS: 1.1 10*3/uL (ref 0.7–4.0)
Lymphocytes Relative: 18 %
MCH: 29.4 pg (ref 26.0–34.0)
MCHC: 34 g/dL (ref 30.0–36.0)
MCV: 86.4 fL (ref 78.0–100.0)
MONO ABS: 0.6 10*3/uL (ref 0.1–1.0)
MONOS PCT: 10 %
Neutro Abs: 4.7 10*3/uL (ref 1.7–7.7)
Neutrophils Relative %: 72 %
Platelets: 135 10*3/uL — ABNORMAL LOW (ref 150–400)
RBC: 5.75 MIL/uL (ref 4.22–5.81)
RDW: 14.7 % (ref 11.5–15.5)
WBC: 6.4 10*3/uL (ref 4.0–10.5)

## 2016-03-31 LAB — COMPREHENSIVE METABOLIC PANEL
ALK PHOS: 39 U/L (ref 38–126)
ALT: 13 U/L — AB (ref 17–63)
AST: 23 U/L (ref 15–41)
Albumin: 4 g/dL (ref 3.5–5.0)
Anion gap: 12 (ref 5–15)
BUN: 13 mg/dL (ref 6–20)
CALCIUM: 9.5 mg/dL (ref 8.9–10.3)
CHLORIDE: 101 mmol/L (ref 101–111)
CO2: 22 mmol/L (ref 22–32)
Creatinine, Ser: 1.93 mg/dL — ABNORMAL HIGH (ref 0.61–1.24)
GFR calc non Af Amer: 35 mL/min — ABNORMAL LOW (ref >60)
GFR, EST AFRICAN AMERICAN: 41 mL/min — AB (ref >60)
Glucose, Bld: 115 mg/dL — ABNORMAL HIGH (ref 65–99)
Potassium: 4.1 mmol/L (ref 3.5–5.1)
SODIUM: 135 mmol/L (ref 135–145)
Total Bilirubin: 0.7 mg/dL (ref 0.3–1.2)
Total Protein: 7.4 g/dL (ref 6.5–8.1)

## 2016-03-31 LAB — URINALYSIS, ROUTINE W REFLEX MICROSCOPIC
Bacteria, UA: NONE SEEN
Bilirubin Urine: NEGATIVE
GLUCOSE, UA: NEGATIVE mg/dL
HGB URINE DIPSTICK: NEGATIVE
Ketones, ur: NEGATIVE mg/dL
Leukocytes, UA: NEGATIVE
NITRITE: NEGATIVE
PROTEIN: 100 mg/dL — AB
SPECIFIC GRAVITY, URINE: 1.023 (ref 1.005–1.030)
Squamous Epithelial / LPF: NONE SEEN
pH: 5 (ref 5.0–8.0)

## 2016-03-31 LAB — CK: CK TOTAL: 149 U/L (ref 49–397)

## 2016-03-31 LAB — I-STAT CG4 LACTIC ACID, ED
Lactic Acid, Venous: 1 mmol/L (ref 0.5–1.9)
Lactic Acid, Venous: 0.91 mmol/L (ref 0.5–1.9)

## 2016-03-31 MED ORDER — OSELTAMIVIR PHOSPHATE 75 MG PO CAPS
75.0000 mg | ORAL_CAPSULE | Freq: Once | ORAL | Status: DC
  Filled 2016-03-31 (×2): qty 1

## 2016-03-31 MED ORDER — ACETAMINOPHEN 325 MG PO TABS
650.0000 mg | ORAL_TABLET | Freq: Once | ORAL | Status: AC
  Administered 2016-03-31: 650 mg via ORAL
  Filled 2016-03-31: qty 2

## 2016-03-31 MED ORDER — OSELTAMIVIR PHOSPHATE 75 MG PO CAPS: 75.0000 mg | ORAL_CAPSULE | Freq: Two times a day (BID) | ORAL | 0 refills | Status: DC

## 2016-03-31 NOTE — ED Notes (Addendum)
While ambulating, pulse ox stayed around 98/99%, but dropped to 94% once.

## 2016-03-31 NOTE — ED Provider Notes (Signed)
[facility] DEPT Provider Note   CSN: [id number] Arrival date & time: 03/31/16  [id number]     History   Chief Complaint Chief Complaint  Patient presents with  . Generalized Body Aches  . Diarrhea    HPI Devon Brady is a 66 y.o. male.  HPI  66 year-old male with a PMH significant for renal insufficiency, CAD, DM, and Hodgkin's lymphoma who presents to the ED with a 2-day history of acutely onset myalgia, diarrhea x3, and fever to 102. Pt denies N/V, dysuria, hematuria, and urinary frequency, but does endorse some exertional dyspnea and chest pain with coughing. He states that the cough is productive of green/yellow sputum but is without hemoptysis.  Past Medical History:  Diagnosis Date  . CAD (coronary artery disease)   . H/O colonoscopy 2013  . H/O hiatal hernia   . Hemorrhoid   . History of colon polyps   . Hodgkin's lymphoma ([place])   . Hydronephrosis   . Hyperlipidemia   . Hypertension   . Hypothyroidism   . Myocardial infarction 2011   "mild one, before the CABG"  . 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 Nocturia   . 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 Renal insufficiency   . Sleep apnea    No CPAP  . TIA (transient ischemic attack) 2011    Patient Active Problem List   Diagnosis Date Noted  . Left ankle injury, initial encounter 07/01/2015  . BPH (benign prostatic hyperplasia) 12/31/2014  . Rectal bleeding 11/06/2014  . Obesity, Class II, BMI 35-39.9, with comorbidity ([facility]) 10/04/2013  . NSTEMI (non-ST elevated myocardial infarction) ([place]) 10/03/2013  . Hyperlipidemia with target LDL less than 100 05/08/2013  . Hypothyroidism, 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  . 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 Routine general medical examination at a health care facility 12/13/2011  . History of Hodgkin's lymphoma   . Hydronephrosis   . Primary thyroid papillary carcinoma ([place])   . Renal  insufficiency   . CAD (coronary artery disease) 10/13/2010  . Erectile dysfunction 10/13/2010    Past Surgical History:  Procedure Laterality Date  . BONE MARROW ASPIRATE,BIOPSY, AND CLOT  06/11/11   LEFT ILIAC CREAST  . BUNIONECTOMY Right 1978   foot  . CARDIAC CATHETERIZATION  10/15/2009  . COLONOSCOPY    . CORONARY ARTERY BYPASS GRAFT  10/17/2009   LIMA to LAD,SVG to Ramus,SVG to OM sequential to OM2, SVG to marginal of RCA  . ESOPHAGOGASTRODUODENOSCOPY    . LEFT HEART CATHETERIZATION WITH CORONARY/GRAFT ANGIOGRAM N/A 10/03/2013   Procedure: LEFT HEART CATHETERIZATION WITH Beatrix Fetters;  Surgeon: Sinclair Grooms, MD;  Location: [facility] CATH LAB;  Service: Cardiovascular;  Laterality: N/A;  . LYMPH NODE BIOPSY  06/07/11   RIGHT INGUINAL NODE: CLASSICAL HODGKIN'S LYMPHOMA, NODULAR SCLEROSIS TYPE  . PORT-A-CATH REMOVAL Left 12/06/2013   Procedure: REMOVAL PORT-A-CATH;  Surgeon: [name], MD;  Location: [facility];  Service: General;  Laterality: Left;  . PORTACATH PLACEMENT  06/10/2011   Procedure: INSERTION PORT-A-CATH;  Surgeon: [name], MD;  Location: [facility];  Service: General;  Laterality: Left;  subclavian   . THYROIDECTOMY  03/31/2012   Procedure: THYROIDECTOMY;  Surgeon: Izora Gala, MD;  Location: [facility];  Service: ENT;  Laterality: N/A;  . TONSILLECTOMY     as a child  . [place] Medications  Prior to Admission medications   Medication Sig Start Date End Date Taking? Authorizing Provider  aspirin EC 81 MG tablet Take 81 mg by mouth every evening.     Historical Provider, MD  ezetimibe (ZETIA) 10 MG tablet Take 1 tablet (10 mg total) by mouth daily. 07/07/15   Burnell Blanks, MD  levothyroxine (SYNTHROID, LEVOTHROID) 150 MCG tablet Take 1 tablet (150 mcg total) by mouth daily. 01/04/16   Janith Lima, MD  metoprolol succinate (TOPROL-XL) 50 MG 24 hr tablet TAKE 1 TABLET BY MOUTH EVERY DAY 02/16/16   Burnell Blanks, MD  nitroGLYCERIN (NITROSTAT) 0.4 MG SL tablet PLACE 1 TABLET UNDER THE TONGUE EVERY 5 MINUTES AS NEEDED FOR CHEST PAIN Patient not taking: Reported on 12/31/2015 05/03/14   Burnell Blanks, MD  oseltamivir (TAMIFLU) 75 MG capsule Take 1 capsule (75 mg total) by mouth every 12 (twelve) hours. 03/31/16   Varney Biles, MD  rosuvastatin (CRESTOR) 10 MG tablet TAKE 1 TABLET BY MOUTH 3 TIMES A WEEK. 02/25/16   Burnell Blanks, MD    Family History Family History  Problem Relation Age of Onset  . Arthritis Mother   . Hypertension Mother   . Diabetes Father   . Coronary artery disease Father 56    Died age 19  . Kidney disease Father   . Hypertension Sister   . Hypertension Sister     2nd Sister  . Alcohol abuse Brother   . Prostate cancer Paternal Uncle     Great  . Cancer Neg Hx   . Drug abuse Neg Hx   . Early death Neg Hx   . Stroke Neg Hx     Social History Social History  Substance Use Topics  . Smoking status: Never Smoker  . Smokeless tobacco: Never Used  . Alcohol use Yes     Comment: 10/02/2013 "couple beers; couple times/month"     Allergies   Crestor [rosuvastatin] and Cialis [tadalafil]   Review of Systems Review of Systems  ROS 10 Systems reviewed and are negative for acute change except as noted in the HPI.     Physical Exam Updated Vital Signs BP 117/81   Pulse 85   Temp 101.7 F (38.7 C) (Oral)   Resp 23   Ht 5\' 10"  (1.778 m)   Wt 230 lb (104.3 kg)   SpO2 93%   BMI 33.00 kg/m   Physical Exam  Constitutional: He is oriented to person, place, and time. He appears well-developed.  HENT:  Head: Atraumatic.  Neck: Neck supple.  Cardiovascular: Normal rate and intact distal pulses.   No murmur heard. Pulmonary/Chest: Effort normal. No respiratory distress. He has no wheezes.  Musculoskeletal: He exhibits no edema or deformity.  Neurological: He is alert and oriented to person, place, and time.  Skin: Skin is warm.    Nursing note and vitals reviewed.    ED Treatments / Results  Labs (all labs ordered are listed, but only abnormal results are displayed) Labs Reviewed  COMPREHENSIVE METABOLIC PANEL - Abnormal; Notable for the following:       Result Value   Glucose, Bld 115 (*)    Creatinine, Ser 1.93 (*)    ALT 13 (*)    GFR calc non Af Amer 35 (*)    GFR calc Af Amer 41 (*)    All other components within normal limits  CBC WITH DIFFERENTIAL/PLATELET - Abnormal; Notable for the following:    Platelets 135 (*)  All other components within normal limits  URINALYSIS, ROUTINE W REFLEX MICROSCOPIC - Abnormal; Notable for the following:    Protein, ur 100 (*)    All other components within normal limits  CULTURE, BLOOD (ROUTINE X 2)  CULTURE, BLOOD (ROUTINE X 2)  URINE CULTURE  CK  I-STAT CG4 LACTIC ACID, ED  I-STAT CG4 LACTIC ACID, ED    EKG  EKG Interpretation None       Radiology Dg Chest 2 View  Result Date: 03/31/2016 CLINICAL DATA:  Productive cough for 3 days with fever, diarrhea, and midline chest pain, hypertension; past history of non-Hodgkins lymphoma, coronary artery disease post MI EXAM: CHEST  2 VIEW COMPARISON:  10/02/2013 FINDINGS: Interval removal of LEFT subclavian Port-A-Cath. Normal heart size post CABG. Mediastinal contours and pulmonary vascularity normal. Mild RIGHT basilar atelectasis. No acute infiltrate, pleural effusion, or pneumothorax. Bones demineralized. IMPRESSION: Mild RIGHT basilar atelectasis. Post CABG. Electronically Signed   By: Lavonia Dana M.D.   On: 03/31/2016 09:57    Procedures Procedures (including critical care time)  Medications Ordered in ED Medications  oseltamivir (TAMIFLU) capsule 75 mg (not administered)  acetaminophen (TYLENOL) tablet 650 mg (650 mg Oral Given 03/31/16 1151)     Initial Impression / Assessment and Plan / ED Course  I have reviewed the triage vital signs and the nursing notes.  Pertinent labs & imaging results  that were available during my care of the patient were reviewed by me and considered in my medical decision making (see chart for details).  Clinical Course as of Mar 31 1346  Wed Mar 31, 2016  1327 Results from the ER workup discussed with the patient face to face and all questions answered to the best of my ability.  Pt appears to have flu. We ambulated -no hypoxia. Labs reassuring. Pt reliable and has supportive family. Lungs w/o focal findings, CXR is clear and no elevated WC, so we doubt PNA. With the uri like symptoms, myalgias and chills - we think clinically pt has influenza. We discussed tamiflu, as will start it now.  Strict ER return precautions have been discussed, and patient is agreeing with the plan and is comfortable with the workup done and the recommendations from the ER.   [AN]    Clinical Course User Index [AN] Varney Biles, MD    Pt comes in with URI like symptoms, productive cough, fevers, myalgias, chills. He is immunocompromised with CKD, DM. Pt has lymphoma hx.  Clinical concerns for PNA, Flu. Sepsis screen started.  REASSESSMENT:  Most likely viral infection, particularly concerned for influenza given the onset and symptom pattern. Will give IV fluids and offer Tamiflu. PNA also a possibility in the setting of productive cough and fever, but less likely given myalgias, which are more consistent with flu and multi-system involvement. CXR is not showing infiltrate and WC is normal. SBO considered given history of cholecystectomy, but unlikely given non-focal abdominal findings and profuse diarrhea. Bacterial enteritis considered. Pt has no cdiff risk factors.   Final Clinical Impressions(s) / ED Diagnoses   Final diagnoses:  Influenza-like illness    New Prescriptions New Prescriptions   OSELTAMIVIR (TAMIFLU) 75 MG CAPSULE    Take 1 capsule (75 mg total) by mouth every 12 (twelve) hours.     Varney Biles, MD 04/01/16 [phone]

## 2016-03-31 NOTE — ED Notes (Addendum)
Per EMT Danielle, patient's oxygen saturation stayed up around 98-99% on RA while ambulating.  Only dropped to 94% on RA once while patient was walking.

## 2016-03-31 NOTE — ED Notes (Signed)
Spoke with Dr. Kathrynn Humble regarding no order for fluid bolus, states awaiting lab results. Pt and family updated.

## 2016-03-31 NOTE — Discharge Instructions (Signed)
°  We think what you have is a viral syndrome or flu - the treatment for which is symptomatic relief only, and your body will fight the infection off in a few days. We are prescribing you some meds. See your primary care doctor in 1 week if the symptoms dont improve.  Please return to the ER if your symptoms worsen; you have increased pain, fevers, chills, inability to keep any medications down, confusion, worsening shortness of breath. Otherwise see the outpatient doctor as requested.

## 2016-03-31 NOTE — ED Triage Notes (Signed)
Pt with headache, body aches diarrhea and temps of 102 since Sunday.

## 2016-04-01 LAB — URINE CULTURE: CULTURE: NO GROWTH

## 2016-04-05 LAB — CULTURE, BLOOD (ROUTINE X 2)
Culture: NO GROWTH
Culture: NO GROWTH

## 2016-06-07 ENCOUNTER — Other Ambulatory Visit: Payer: Self-pay | Admitting: Cardiovascular Disease

## 2016-06-14 ENCOUNTER — Telehealth: Payer: Self-pay | Admitting: Cardiovascular Disease

## 2016-06-14 ENCOUNTER — Ambulatory Visit (INDEPENDENT_AMBULATORY_CARE_PROVIDER_SITE_OTHER): Payer: 59 | Admitting: Nurse Practitioner

## 2016-06-14 ENCOUNTER — Encounter: Payer: Self-pay | Admitting: Nurse Practitioner

## 2016-06-14 VITALS — BP 120/74 | HR 87 | Temp 98.1°F | Ht 70.0 in | Wt 252.0 lb

## 2016-06-14 DIAGNOSIS — A6920 Lyme disease, unspecified: Secondary | ICD-10-CM

## 2016-06-14 MED ORDER — DIPHENHYDRAMINE HCL 25 MG PO TABS: 25.0000 mg | ORAL_TABLET | Freq: Three times a day (TID) | ORAL | 0 refills | Status: DC | PRN

## 2016-06-14 MED ORDER — DOXYCYCLINE HYCLATE 100 MG PO TABS: 100.0000 mg | ORAL_TABLET | Freq: Two times a day (BID) | ORAL | 0 refills | Status: AC

## 2016-06-14 NOTE — Progress Notes (Signed)
Subjective:  Patient ID: Devon Brady, male    DOB: March 26, 1950  Age: 66 y.o. MRN: [id number]  CC: Tick Removal (tick bite 2 days ago,itching,sensitive,red,swelling,pain. used neosporin)   Rash  This is a new problem. The current episode started in the past 7 days. The problem has been rapidly worsening since onset. The affected locations include the abdomen. The rash is characterized by itchiness, pain and redness. He was exposed to an insect bite/sting (tick removed yesterday). Pertinent negatives include no anorexia, fatigue, fever or joint pain. Past treatments include antibiotic cream (removed tick).    Outpatient Medications Prior to Visit  Medication Sig Dispense Refill  . aspirin EC 81 MG tablet Take 81 mg by mouth every evening.     . ezetimibe (ZETIA) 10 MG tablet Take 1 tablet (10 mg total) by mouth daily. 90 tablet 3  . levothyroxine (SYNTHROID, LEVOTHROID) 150 MCG tablet Take 1 tablet (150 mcg total) by mouth daily. 90 tablet 1  . metoprolol succinate (TOPROL-XL) 50 MG 24 hr tablet Take 1 tablet (50 mg total) by mouth daily. *Patient is overdue for an appointment and needs to call and schedule for further refills* 30 tablet 0  . nitroGLYCERIN (NITROSTAT) 0.4 MG SL tablet PLACE 1 TABLET UNDER THE TONGUE EVERY 5 MINUTES AS NEEDED FOR CHEST PAIN 25 tablet 6  . rosuvastatin (CRESTOR) 10 MG tablet TAKE 1 TABLET BY MOUTH 3 TIMES A WEEK. 15 tablet 3  . oseltamivir (TAMIFLU) 75 MG capsule Take 1 capsule (75 mg total) by mouth every 12 (twelve) hours. (Patient not taking: Reported on 06/14/2016) 9 capsule 0   No facility-administered medications prior to visit.     ROS See HPI  Objective:  BP 120/74   Pulse 87   Temp 98.1 F (36.7 C)   Ht 5\' 10"  (1.778 m)   Wt 252 lb (114.3 kg)   SpO2 99%   BMI 36.16 kg/m   BP Readings from Last 3 Encounters:  06/14/16 120/74  03/31/16 129/78  12/31/15 122/84    Wt Readings from Last 3 Encounters:  06/14/16 252 lb (114.3 kg)    03/31/16 230 lb (104.3 kg)  12/31/15 237 lb 8 oz (107.7 kg)    Physical Exam  Constitutional: He is oriented to person, place, and time.  Cardiovascular: Normal rate and regular rhythm.   Pulmonary/Chest: Effort normal.  Musculoskeletal: Normal range of motion.  Neurological: He is alert and oriented to person, place, and time.  Skin: Skin is warm and dry. Lesion and rash noted. Rash is maculopapular. There is erythema.     Vitals reviewed.   Lab Results  Component Value Date   WBC 6.4 03/31/2016   HGB 16.9 03/31/2016   HCT 49.7 03/31/2016   PLT 135 (L) 03/31/2016   GLUCOSE 115 (H) 03/31/2016   CHOL 172 12/31/2015   TRIG 77.0 12/31/2015   HDL 46.80 12/31/2015   LDLCALC 110 (H) 12/31/2015   ALT 13 (L) 03/31/2016   AST 23 03/31/2016   NA 135 03/31/2016   K 4.1 03/31/2016   CL 101 03/31/2016   CREATININE 1.93 (H) 03/31/2016   BUN 13 03/31/2016   CO2 22 03/31/2016   TSH 0.05 (L) 12/31/2015   PSA 1.78 08/05/2015   INR 0.97 10/02/2013   HGBA1C 6.3 12/31/2015   MICROALBUR 13.7 (H) 12/31/2015    Dg Chest 2 View  Result Date: 03/31/2016 CLINICAL DATA:  Productive cough for 3 days with fever, diarrhea, and midline chest pain, hypertension; past  history of non-Hodgkins lymphoma, coronary artery disease post MI EXAM: CHEST  2 VIEW COMPARISON:  10/02/2013 FINDINGS: Interval removal of LEFT subclavian Port-A-Cath. Normal heart size post CABG. Mediastinal contours and pulmonary vascularity normal. Mild RIGHT basilar atelectasis. No acute infiltrate, pleural effusion, or pneumothorax. Bones demineralized. IMPRESSION: Mild RIGHT basilar atelectasis. Post CABG. Electronically Signed   By: Lavonia Dana M.D.   On: 03/31/2016 09:57    Assessment & Plan:   Devon Brady was seen today for tick removal.  Diagnoses and all orders for this visit:  Erythema migrans (Lyme disease) -     doxycycline (VIBRA-TABS) 100 MG tablet; Take 1 tablet (100 mg total) by mouth 2 (two) times daily. -      diphenhydrAMINE (BENADRYL) 25 MG tablet; Take 1 tablet (25 mg total) by mouth every 8 (eight) hours as needed for itching.   I am having Devon Brady start on doxycycline and diphenhydrAMINE. I am also having him maintain his aspirin EC, nitroGLYCERIN, ezetimibe, levothyroxine, rosuvastatin, oseltamivir, metoprolol succinate, moxifloxacin, PROLENSA, and DUREZOL.  Meds ordered this encounter  Medications  . moxifloxacin (VIGAMOX) 0.5 % ophthalmic solution    Sig: INSTILL 1 DROP IN LEFT EYE STARTING 2 DAYS PRIOR TO SURGERY AND 2 DROPS MORNING OF SURGERY    Refill:  0  . PROLENSA 0.07 % SOLN    Sig: INSTILL 1 DROP INTO LEFT EYE ONCE A DAY 2 DAYS BEFORE SURGERY    Refill:  0  . DUREZOL 0.05 % EMUL    Sig: INSTILL 1 DROP INTO LEFT EYE 4 TIMES A DAY    Refill:  0  . doxycycline (VIBRA-TABS) 100 MG tablet    Sig: Take 1 tablet (100 mg total) by mouth 2 (two) times daily.    Dispense:  28 tablet    Refill:  0    Order Specific Question:   Supervising Provider    Answer:   Cassandria Anger [[id number]]  . diphenhydrAMINE (BENADRYL) 25 MG tablet    Sig: Take 1 tablet (25 mg total) by mouth every 8 (eight) hours as needed for itching.    Dispense:  30 tablet    Refill:  0    Order Specific Question:   Supervising Provider    Answer:   Cassandria Anger [[id number]]    Follow-up: Return if symptoms worsen or fail to improve.  Wilfred Lacy, NP

## 2016-06-14 NOTE — Progress Notes (Signed)
Pre visit review using our clinic review tool, if applicable. No additional management support is needed unless otherwise documented below in the visit note. 

## 2016-06-14 NOTE — Patient Instructions (Signed)
Lyme Disease Lyme disease is an infection that affects many parts of the body, including the skin, joints, and nervous system. It is a bacterial infection that starts from the bite of an infected tick. The infection can spread, and some of the symptoms are similar to the flu. If Lyme disease is not treated, it may cause joint pain, swelling, numbness, problems thinking, fatigue, muscle weakness, and other problems. What are the causes? This condition is caused by bacteria called Borrelia burgdorferi. You can get Lyme disease by being bitten by an infected tick. The tick must be attached to your skin to pass along the infection. Deer often carry infected ticks. What increases the risk? The following factors may make you more likely to develop this condition:  Living in or visiting these areas in the [place]  [place].  The [place] states.  The [place].  Spending time in wooded or grassy areas.  Being outdoors with exposed skin.  Camping, gardening, hiking, fishing, or hunting outdoors.  Failing to remove a tick from your skin within 3-4 days. What are the signs or symptoms? Symptoms of this condition include:  A round, red rash that surrounds the center of the tick bite. This is the first sign of infection. The center of the rash may be blood colored or have tiny blisters.  Fatigue.  Headache.  Chills and fever.  General achiness.  Joint pain, often in the knees.  Muscle pain.  Swollen lymph glands.  Stiff neck. How is this diagnosed? This condition is diagnosed based on:  Your symptoms and medical history.  A physical exam.  A blood test. How is this treated? The main treatment for this condition is antibiotic medicine, which is usually taken by mouth (orally). The length of treatment depends on how soon after a tick bite you begin taking the medicine. In some cases, treatment is necessary for several weeks. If the infection is severe, antibiotics may  need to be given through an IV tube that is inserted into one of your veins. Follow these instructions at home:  Take your antibiotic medicine as told by your health care provider. Do not stop taking the antibiotic even if you start to feel better.  Ask your health care provider about takinga probiotic in between doses of your antibiotic to help avoid stomach upset or diarrhea.  Check with your health care provider before supplementing your treatment. Many alternative therapies have not been proven and may be harmful to you.  Keep all follow-up visits as told by your health care provider. This is important. How is this prevented? You can become reinfected if you get another tick bite from an infected tick. Take these steps to help prevent an infection:  Cover your skin with light-colored clothing when you are outdoors in the spring and summer months.  Spray clothing and skin with bug spray. The spray should be 20-30% DEET.  Avoid wooded, grassy, and shaded areas.  Remove yard litter, brush, trash, and plants that attract deer and rodents.  Check yourself for ticks when you come indoors.  Wash clothing worn each day.  Check your pets for ticks before they come inside.  If you find a tick:  Remove it with tweezers.  Clean your hands and the bite area with rubbing alcohol or soap and water. Pregnant women should take special care to avoid tick bites because the infection can be passed along to the fetus. Contact a health care provider if:  You have symptoms after   treatment.  You have removed a tick and want to bring it to your health care provider for testing. Get help right away if:  You have an irregular heartbeat.  You have nerve pain.  Your face feels numb. This information is not intended to replace advice given to you by your health care provider. Make sure you discuss any questions you have with your health care provider. Document Released: 06/07/2000 Document  Revised: 10/21/2015 Document Reviewed: 10/21/2015 Elsevier Interactive Patient Education  2017 [employer]  

## 2016-06-14 NOTE — Telephone Encounter (Signed)
New Message:   Pt wants to know if he will need lab work before his yearly appt on 07-01-17?

## 2016-06-14 NOTE — Telephone Encounter (Signed)
Pt asking if he needs any lab prior to 07/01/16 appt with Dr Buena Irish. Pt advised I will forward to Dr Angelena Form for review and that he is out of the office this week.

## 2016-06-21 NOTE — Telephone Encounter (Signed)
Called patient and let him know that he does not need lab work before his appointment.

## 2016-06-21 NOTE — Telephone Encounter (Signed)
He will not need lab work before his appt with me. Can we let him know? Thanks, chris

## 2016-06-25 IMAGING — CT CT ABD-PELV W/ CM
2 of 5 series · 17 of 46 positions shown, 19 images · IV contrast (OMNIPAQUE)
Comparison: 05/07/2013

CLINICAL DATA: Hodgkin's lymphoma.

EXAM:
CT CHEST, ABDOMEN, AND PELVIS WITH CONTRAST
TECHNIQUE: Multidetector CT imaging of the chest, abdomen and pelvis was
performed following the standard protocol during bolus
administration of intravenous contrast.
CONTRAST:  100mL OMNIPAQUE IOHEXOL 300 MG/ML  SOLN

[Series 2: cap with st · axial · 0.82mm/px · z∈[-696,-106]mm · 14 of 134 slices shown, 16 images]
[im 8/134  soft-tissue]
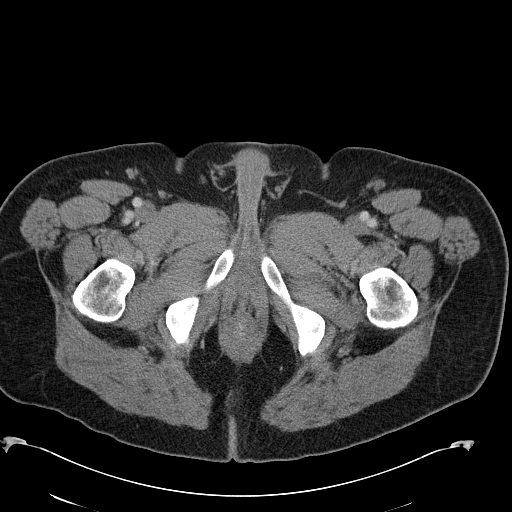
[im 8/134  bone]
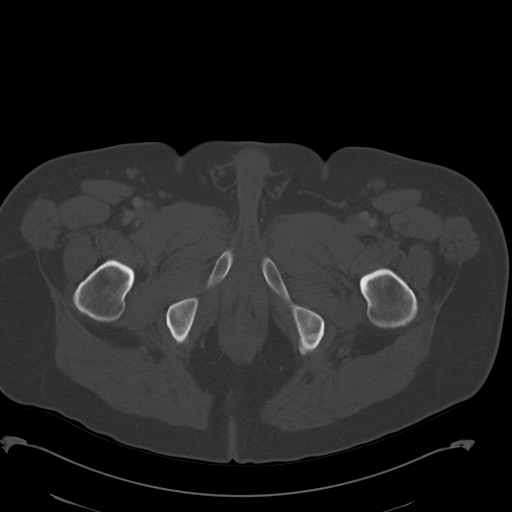
[im 15/134  soft-tissue]
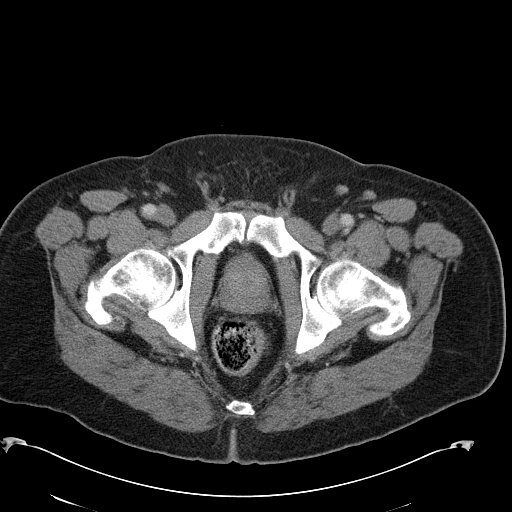
[im 30/134  soft-tissue]
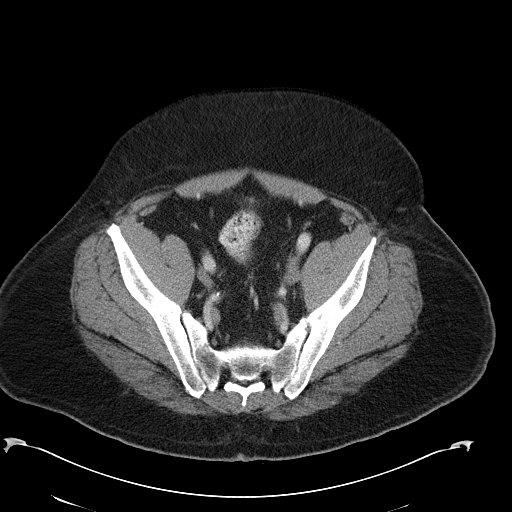
[im 37/134  soft-tissue]
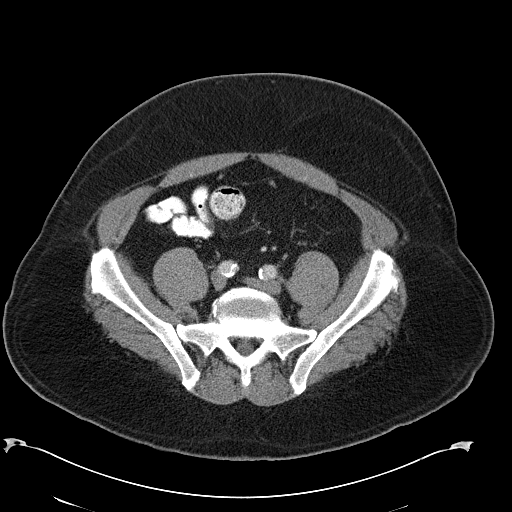
[im 45/134  soft-tissue]
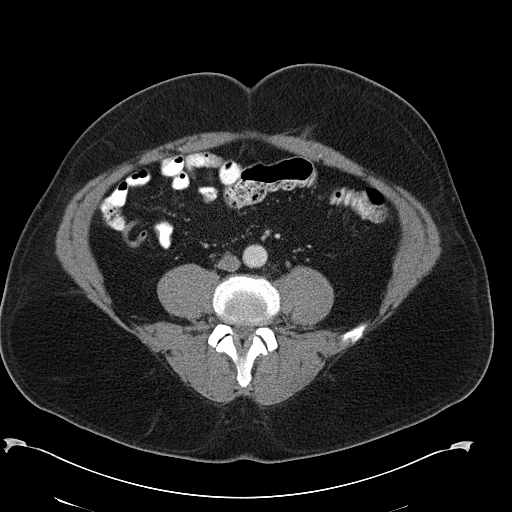
[im 52/134  soft-tissue]
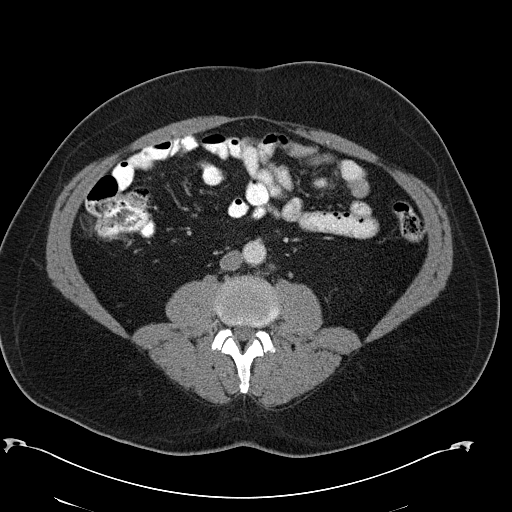
[im 60/134  soft-tissue]
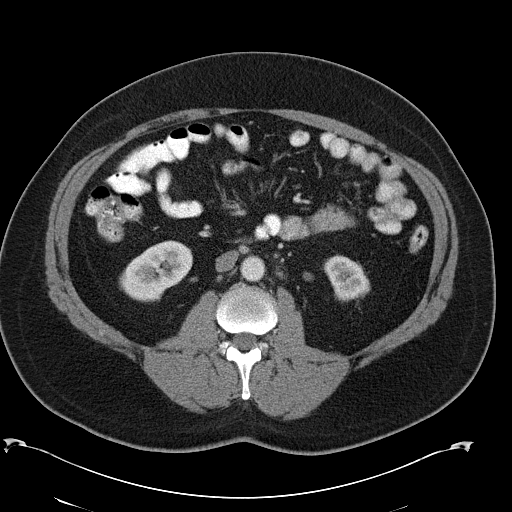
[im 74/134  soft-tissue]
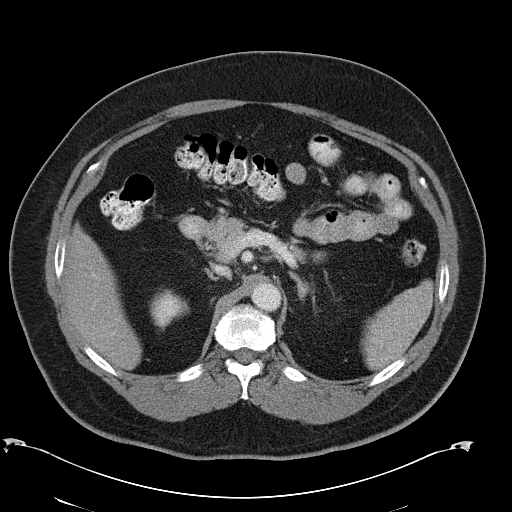
[im 82/134  soft-tissue]
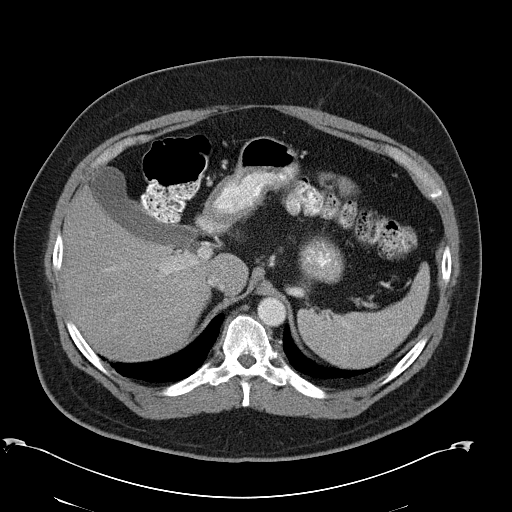
[im 82/134  bone]
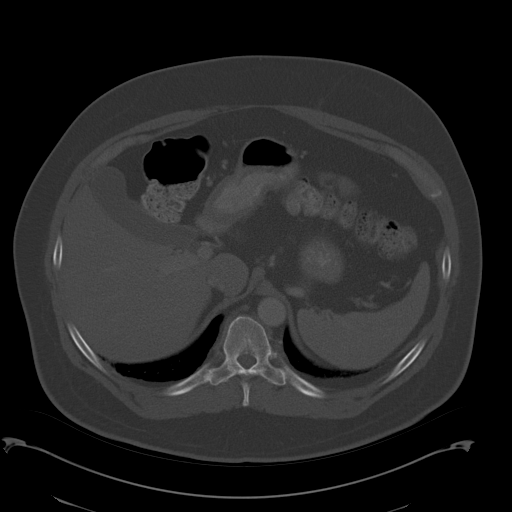
[im 89/134  soft-tissue]
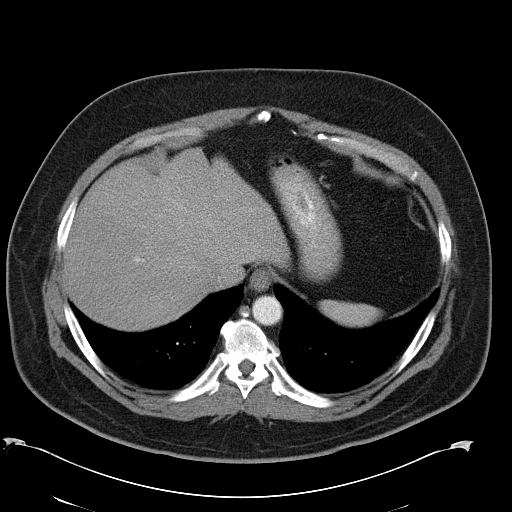
[im 97/134  soft-tissue]
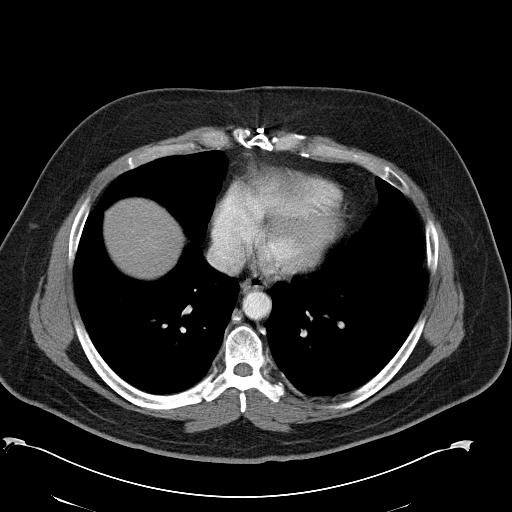
[im 104/134  soft-tissue]
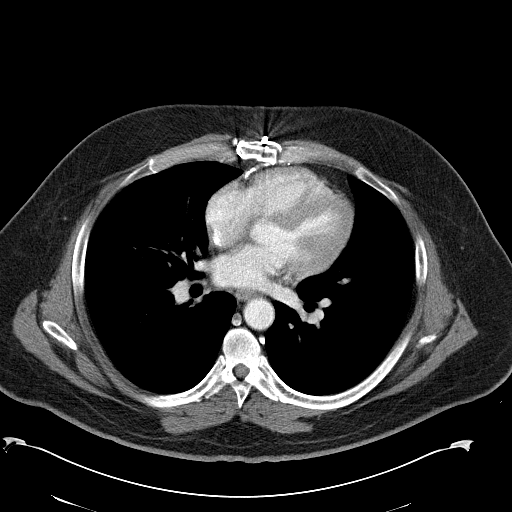
[im 119/134  soft-tissue]
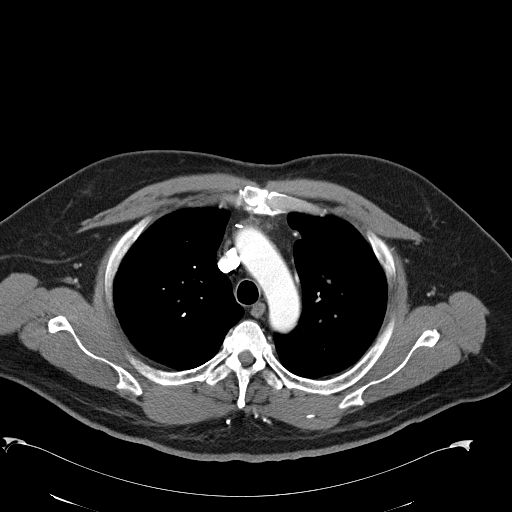
[im 126/134  soft-tissue]
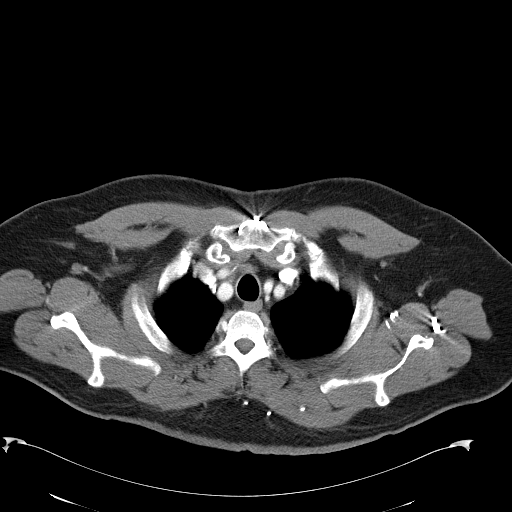

[Series 602: <mpr thick range> · coronal · 1.30mm/px · 3 of 106 slices shown]
[im 36/106  soft-tissue]
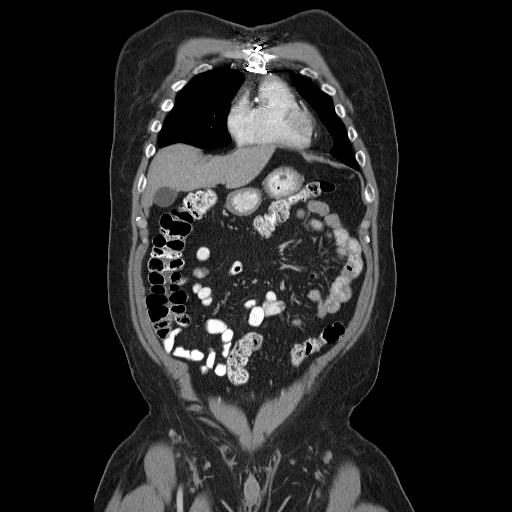
[im 47/106  soft-tissue]
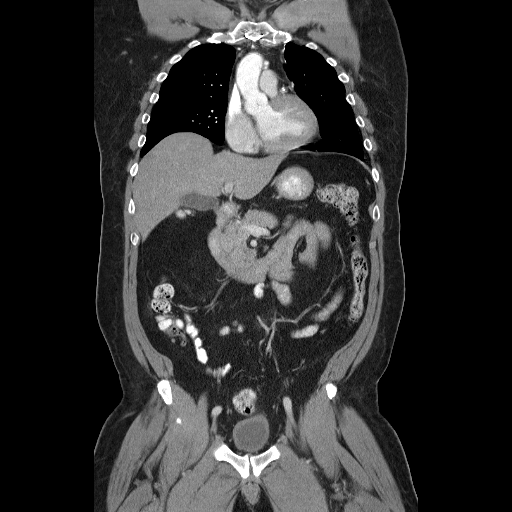
[im 59/106  soft-tissue]
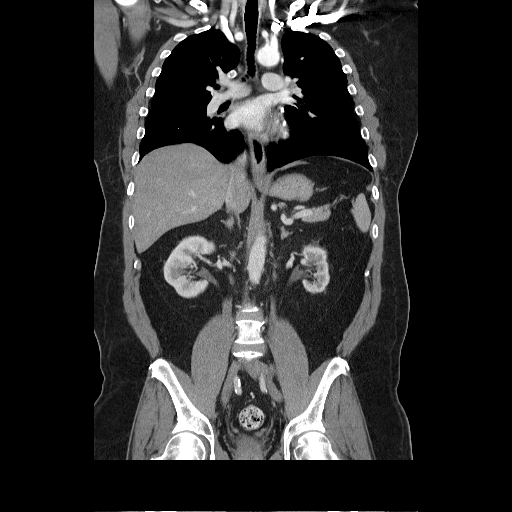

[17 of 46 positions shown; findings below may reference images not displayed]

FINDINGS: CT CHEST FINDINGS

The patient is status post median sternotomy and CABG procedure. The
heart size is normal. No pericardial effusion. No enlarged hilar or
mediastinal lymph nodes. No axillary or supraclavicular adenopathy.

No pleural effusion. The lungs are clear. No airspace consolidation.
No suspicious pulmonary nodule or mass identified.

Review of the visualized bony structures is unremarkable.

CT ABDOMEN AND PELVIS FINDINGS

The liver is normal. The gallbladder appears normal. No biliary
dilatation. Normal appearance of the pancreas. The spleen appears
within normal limits.

The adrenal glands are both normal. The right kidney appears normal.
Mild volume loss from the left kidney noted. The urinary bladder
appears normal. Prostate gland and seminal vesicles are
unremarkable.

Calcified atherosclerotic disease involves the abdominal aorta. No
aneurysm. There is no adenopathy within the retroperitoneum or
mesenteric. There is no pelvic or inguinal adenopathy identified. No
free fluid or fluid collections within the abdomen or pelvis
identified.

The stomach appears normal. The small bowel loops are within normal
limits. Normal appearance of the appendix. The colon appears normal.

Review of the visualized bony structures is unremarkable.
IMPRESSION: 1. No acute findings within the chest, abdomen or pelvis.
2. No evidence to suggest residual or recurrent lymphoma in the
chest abdomen or pelvis.

## 2016-06-30 NOTE — Progress Notes (Signed)
Chief Complaint  Patient presents with  . Follow-up    History of Present Illness: 66 yo AAM with history of CAD s/p 5V CABG August 2011, HTN, hyperlipidemia, stage IV Hodgkins lymphoma who is here today for cardiac follow up. Cardiac cath in August 2011 showed severe triple vessel disease. He underwent 5V CABG August 2011. (LIMA to LAD, SVG to Ramus, SVG sequential to OM1/OM2, SVG to RV marginal). He was discovered to have thyroid cancer in 2014 and he underwent a thyroidectomy. Admitted to [facility] 10/02/13 with chest pain and ruled in for MI with elevated troponin. Cardiac cath per Dr. Tamala Julian on 10/03/13 with patent IMA graft to LAD, patent SVG to Ramus, occluded SVG to OM1/OM2, SVG to PDA. There were no focal targets for PCI. Imdur was added but he did not continue taking this. ABI normal March 2016.   He is here today for follow up. The patient denies any chest pain, dyspnea, palpitations, lower extremity edema, orthopnea, PND, dizziness, near syncope or syncope. He is not exercising. He has gained 20 lbs over last 3 months but he thinks this is from lack of exercise and poor diet.    Primary Care Physician: Scarlette Calico, MD  Past Medical History:  Diagnosis Date  . CAD (coronary artery disease)   . H/O colonoscopy 2013  . H/O hiatal hernia   . Hemorrhoid   . History of colon polyps   . Hodgkin's lymphoma ([place])   . Hydronephrosis   . Hyperlipidemia   . Hypertension   . Hypothyroidism   . Myocardial infarction [facility]) 2011   "mild one, before the CABG"  . 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 Nocturia   . 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 Renal insufficiency   . Sleep apnea    No CPAP  . TIA (transient ischemic attack) 2011    Past Surgical History:  Procedure Laterality Date  . BONE MARROW ASPIRATE,BIOPSY, AND CLOT  06/11/11   LEFT ILIAC CREAST  . BUNIONECTOMY Right 1978   foot  . CARDIAC CATHETERIZATION  10/15/2009  .  COLONOSCOPY    . CORONARY ARTERY BYPASS GRAFT  10/17/2009   LIMA to LAD,SVG to Ramus,SVG to OM sequential to OM2, SVG to marginal of RCA  . ESOPHAGOGASTRODUODENOSCOPY    . LEFT HEART CATHETERIZATION WITH CORONARY/GRAFT ANGIOGRAM N/A 10/03/2013   Procedure: LEFT HEART CATHETERIZATION WITH Beatrix Fetters;  Surgeon: Sinclair Grooms, MD;  Location: [facility] CATH LAB;  Service: Cardiovascular;  Laterality: N/A;  . LYMPH NODE BIOPSY  06/07/11   RIGHT INGUINAL NODE: CLASSICAL HODGKIN'S LYMPHOMA, NODULAR SCLEROSIS TYPE  . PORT-A-CATH REMOVAL Left 12/06/2013   Procedure: REMOVAL PORT-A-CATH;  Surgeon: [name], MD;  Location: [facility];  Service: General;  Laterality: Left;  . PORTACATH PLACEMENT  06/10/2011   Procedure: INSERTION PORT-A-CATH;  Surgeon: [name], MD;  Location: [facility];  Service: General;  Laterality: Left;  subclavian   . THYROIDECTOMY  03/31/2012   Procedure: THYROIDECTOMY;  Surgeon: Izora Gala, MD;  Location: [facility];  Service: ENT;  Laterality: N/A;  . TONSILLECTOMY     as a child  . VASECTOMY  1980    Current Outpatient Prescriptions  Medication Sig Dispense Refill  . aspirin EC 81 MG tablet Take 81 mg by mouth every evening.     . ezetimibe (ZETIA) 10 MG tablet Take 1 tablet (10 mg total) by mouth daily. 90 tablet  3  . levothyroxine (SYNTHROID, LEVOTHROID) 150 MCG tablet Take 1 tablet (150 mcg total) by mouth daily. 90 tablet 1  . metoprolol succinate (TOPROL-XL) 50 MG 24 hr tablet Take 1 tablet (50 mg total) by mouth daily. 90 tablet 3  . moxifloxacin (VIGAMOX) 0.5 % ophthalmic solution INSTILL 1 DROP IN LEFT EYE STARTING 2 DAYS PRIOR TO SURGERY AND 2 DROPS MORNING OF SURGERY  0  . nitroGLYCERIN (NITROSTAT) 0.4 MG SL tablet PLACE 1 TABLET UNDER THE TONGUE EVERY 5 MINUTES AS NEEDED FOR CHEST PAIN 25 tablet 6  . oseltamivir (TAMIFLU) 75 MG capsule Take 1 capsule (75 mg total) by mouth every 12 (twelve) hours. 9 capsule 0  . PROLENSA 0.07 % SOLN INSTILL 1  DROP INTO LEFT EYE ONCE A DAY 2 DAYS BEFORE SURGERY  0  . rosuvastatin (CRESTOR) 10 MG tablet TAKE 1 TABLET BY MOUTH 3 TIMES A WEEK. 15 tablet 3  . DUREZOL 0.05 % EMUL INSTILL 1 DROP INTO LEFT EYE 4 TIMES A DAY  0   No current facility-administered medications for this visit.     Allergies  Allergen Reactions  . Crestor [Rosuvastatin]     Muscle aches  . Cialis [Tadalafil]     headache    Social History   Social History  . Marital status: Married    Spouse name: N/A  . Number of children: 2  . Years of education: N/A   Occupational History  . 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    Social History Main Topics  . Smoking status: Never Smoker  . Smokeless tobacco: Never Used  . Alcohol use Yes     Comment: 10/02/2013 "couple beers; couple times/month"  . Drug use: No  . Sexual activity: No   Other Topics Concern  . Not on file   Social History Narrative  . No narrative on file    Family History  Problem Relation Age of Onset  . Arthritis Mother   . Hypertension Mother   . Diabetes Father   . Coronary artery disease Father 6    Died age 48  . Kidney disease Father   . Hypertension Sister   . Hypertension Sister     2nd Sister  . Alcohol abuse Brother   . Prostate cancer Paternal Uncle     Great  . Cancer Neg Hx   . Drug abuse Neg Hx   . Early death Neg Hx   . Stroke Neg Hx     Review of Systems:  As stated in the HPI and otherwise negative.   BP 130/86   Pulse 65   Ht 5\' 10"  (1.778 m)   Wt 252 lb (114.3 kg)   SpO2 97%   BMI 36.16 kg/m   Physical Examination:  General: Well developed, well nourished, NAD  HEENT: OP clear, mucus membranes moist  SKIN: warm, dry. No rashes. Neuro: No focal deficits  Musculoskeletal: Muscle strength 5/5 all ext  Psychiatric: Mood and affect normal  Neck: No JVD, no carotid bruits, no thyromegaly, no lymphadenopathy.  Lungs:Clear bilaterally, no wheezes, rhonci, crackles Cardiovascular:  Regular rate and rhythm. No murmurs, gallops or rubs. Abdomen:Soft. Bowel sounds present. Non-tender.  Extremities: No lower extremity edema. Pulses are 2 + in the bilateral DP/PT.  Cardiac cath 10/03/13:  The left main coronary artery is widely patent with smooth distal 30% narrowing.  The left anterior descending artery is totally occluded proximal..  The left circumflex artery is widely  patent with 50% mid vessel narrowing. The first and second marginal branches are patent. The second and third marginal branches are totally occluded. The left circumflex PDA is widely patent. Retrograde filling of the sequential graft to the fourth and third marginal branch is noted.  The right coronary artery is patent but there is severe disease in the acute marginal branch which is a relatively large vessel. It is severely and diffusely involved with a proximal 80% stenosis followed by a proximal 80% stenosis in the mid to distal eccentric 80% stenosis. The continuation of the right coronary beyond the acute marginal branch is 99% obstructed.  BYPASS GRAFT ANGIOGRAPHY: SVG to the ramus intermedius branch) first obtuse marginal) is widely patent.  SVG sequential to the obtuse marginal branches is totally occluded with thrombus present beyond the side to side anastomosis with a small first branch.  SVG to acute marginal of the right coronary is totally occluded at the aorto ostial.  LIMA to the LAD is widely patent  LEFT VENTRICULOGRAM: Left ventricular angiogram was done in the 30 RAO projection and revealed inferior hypokinesis. Overall ejection fraction is normal and estimated to be 50%.  EKG:  EKG is ordered today. The ekg ordered today demonstrates NSR, rate 62 bpm. Incomplete RBBB  Recent Labs: 12/31/2015: TSH 0.05 03/31/2016: ALT 13; BUN 13; Creatinine, Ser 1.93; Hemoglobin 16.9; Platelets 135; Potassium 4.1; Sodium 135   Lipid Panel    Component Value Date/Time   CHOL 172 12/31/2015 1543   TRIG  77.0 12/31/2015 1543   HDL 46.80 12/31/2015 1543   CHOLHDL 4 12/31/2015 1543   VLDL 15.4 12/31/2015 1543   LDLCALC 110 (H) 12/31/2015 1543     Wt Readings from Last 3 Encounters:  07/01/16 252 lb (114.3 kg)  06/14/16 252 lb (114.3 kg)  03/31/16 230 lb (104.3 kg)     Other studies Reviewed: Additional studies/ records that were reviewed today include: . Review of the above records demonstrates:    Assessment and Plan:   1. CAD s/p CABG without angina: He is having no chest pain suggestive of angina. Will continue ASA, beta blocker, statin.    2. HTN: BP is controlled. No changes.    3. HLD: He is on a statin but can only tolerate low doses. He is now on Crestor 10 mg three days weekly. He is also on Zetia. Goal LDL of 70 but may not be achievable with oral regimen. Consider PCSK9-inh therapy.   Current medicines are reviewed at length with the patient today.  The patient does not have concerns regarding medicines.  The following changes have been made:  no change  Labs/ tests ordered today include:   Orders Placed This Encounter  Procedures  . EKG 12-Lead     Disposition:   FU with me in 12 months   Signed, Lauree Chandler, MD 07/01/2016 10:17 AM    [facility] [address], [place], [place]  [address] Phone: [phone]; Fax: [phone]

## 2016-07-01 ENCOUNTER — Ambulatory Visit (INDEPENDENT_AMBULATORY_CARE_PROVIDER_SITE_OTHER): Payer: 59 | Admitting: Cardiovascular Disease

## 2016-07-01 ENCOUNTER — Encounter: Payer: Self-pay | Admitting: Cardiovascular Disease

## 2016-07-01 VITALS — BP 130/86 | HR 65 | Ht 70.0 in | Wt 252.0 lb

## 2016-07-01 DIAGNOSIS — I10 Essential (primary) hypertension: Secondary | ICD-10-CM | POA: Diagnosis not present

## 2016-07-01 DIAGNOSIS — E7800 Pure hypercholesterolemia, unspecified: Secondary | ICD-10-CM | POA: Diagnosis not present

## 2016-07-01 DIAGNOSIS — I25810 Atherosclerosis of coronary artery bypass graft(s) without angina pectoris: Secondary | ICD-10-CM | POA: Diagnosis not present

## 2016-07-01 MED ORDER — METOPROLOL SUCCINATE ER 50 MG PO TB24: 50.0000 mg | ORAL_TABLET | Freq: Every day | ORAL | 3 refills | Status: DC

## 2016-07-01 NOTE — Patient Instructions (Signed)

## 2016-07-07 ENCOUNTER — Other Ambulatory Visit: Payer: Self-pay | Admitting: Cardiovascular Disease

## 2016-07-13 ENCOUNTER — Other Ambulatory Visit: Payer: Self-pay | Admitting: Cardiovascular Disease

## 2016-07-13 DIAGNOSIS — E785 Hyperlipidemia, unspecified: Secondary | ICD-10-CM

## 2016-09-07 ENCOUNTER — Encounter: Payer: Self-pay | Admitting: Internal Medicine

## 2016-09-07 ENCOUNTER — Ambulatory Visit (INDEPENDENT_AMBULATORY_CARE_PROVIDER_SITE_OTHER): Payer: 59 | Admitting: Internal Medicine

## 2016-09-07 ENCOUNTER — Other Ambulatory Visit (INDEPENDENT_AMBULATORY_CARE_PROVIDER_SITE_OTHER): Payer: 59

## 2016-09-07 VITALS — BP 124/80 | HR 76 | Temp 97.8°F | Resp 16 | Ht 70.0 in | Wt 242.5 lb

## 2016-09-07 DIAGNOSIS — N289 Disorder of kidney and ureter, unspecified: Secondary | ICD-10-CM

## 2016-09-07 DIAGNOSIS — R7303 Prediabetes: Secondary | ICD-10-CM

## 2016-09-07 DIAGNOSIS — E890 Postprocedural hypothyroidism: Secondary | ICD-10-CM

## 2016-09-07 DIAGNOSIS — E1121 Type 2 diabetes mellitus with diabetic nephropathy: Secondary | ICD-10-CM

## 2016-09-07 LAB — TSH: TSH: 0.22 u[IU]/mL — ABNORMAL LOW (ref 0.35–4.50)

## 2016-09-07 LAB — BASIC METABOLIC PANEL
BUN: 21 mg/dL (ref 6–23)
CHLORIDE: 109 meq/L (ref 96–112)
CO2: 24 meq/L (ref 19–32)
Calcium: 9.7 mg/dL (ref 8.4–10.5)
Creatinine, Ser: 1.56 mg/dL — ABNORMAL HIGH (ref 0.40–1.50)
GFR: 57.75 mL/min — ABNORMAL LOW (ref >60.00)
GLUCOSE: 105 mg/dL — AB (ref 70–99)
POTASSIUM: 4.1 meq/L (ref 3.5–5.1)
Sodium: 141 mEq/L (ref 135–145)

## 2016-09-07 LAB — POCT GLYCOSYLATED HEMOGLOBIN (HGB A1C): Hemoglobin A1C: 6.1

## 2016-09-07 MED ORDER — LEVOTHYROXINE SODIUM 100 MCG PO TABS: 100.0000 ug | ORAL_TABLET | Freq: Every day | ORAL | 1 refills | Status: DC

## 2016-09-07 NOTE — Patient Instructions (Signed)
Hypothyroidism Hypothyroidism is a disorder of the thyroid. The thyroid is a large gland that is located in the lower front of the neck. The thyroid releases hormones that control how the body works. With hypothyroidism, the thyroid does not make enough of these hormones. What are the causes? Causes of hypothyroidism may include:  Viral infections.  Pregnancy.  Your own defense system (immune system) attacking your thyroid.  Certain medicines.  Birth defects.  Past radiation treatments to your head or neck.  Past treatment with radioactive iodine.  Past surgical removal of part or all of your thyroid.  Problems with the gland that is located in the center of your brain (pituitary).  What are the signs or symptoms? Signs and symptoms of hypothyroidism may include:  Feeling as though you have no energy (lethargy).  Inability to tolerate cold.  Weight gain that is not explained by a change in diet or exercise habits.  Dry skin.  Coarse hair.  Menstrual irregularity.  Slowing of thought processes.  Constipation.  Sadness or depression.  How is this diagnosed? Your health care provider may diagnose hypothyroidism with blood tests and ultrasound tests. How is this treated? Hypothyroidism is treated with medicine that replaces the hormones that your body does not make. After you begin treatment, it may take several weeks for symptoms to go away. Follow these instructions at home:  Take medicines only as directed by your health care provider.  If you start taking any new medicines, tell your health care provider.  Keep all follow-up visits as directed by your health care provider. This is important. As your condition improves, your dosage needs may change. You will need to have blood tests regularly so that your health care provider can watch your condition. Contact a health care provider if:  Your symptoms do not get better with treatment.  You are taking thyroid  replacement medicine and: ? You sweat excessively. ? You have tremors. ? You feel anxious. ? You lose weight rapidly. ? You cannot tolerate heat. ? You have emotional swings. ? You have diarrhea. ? You feel weak. Get help right away if:  You develop chest pain.  You develop an irregular heartbeat.  You develop a rapid heartbeat. This information is not intended to replace advice given to you by your health care provider. Make sure you discuss any questions you have with your health care provider. Document Released: 03/01/2005 Document Revised: 08/07/2015 Document Reviewed: 07/17/2013 Elsevier Interactive Patient Education  2017 Elsevier Inc.  

## 2016-09-07 NOTE — Progress Notes (Signed)
Subjective:  Patient ID: Devon Brady, male    DOB: 11/30/1950  Age: 66 y.o. MRN: [id number]  CC: Hypothyroidism   HPI Devon Brady presents for f/up - He has been dealing with low back pain for a few weeks and tells me that he is seeing a nurse practitioner at his place of employment. The back pain radiates into his lower extremities. He is taking some medications and doing physical therapy. He otherwise feels well and offers no other complaints today.  Outpatient Medications Prior to Visit  Medication Sig Dispense Refill  . aspirin EC 81 MG tablet Take 81 mg by mouth every evening.     . DUREZOL 0.05 % EMUL INSTILL 1 DROP INTO LEFT EYE 4 TIMES A DAY  0  . ezetimibe (ZETIA) 10 MG tablet TAKE 1 TABLET (10 MG TOTAL) BY MOUTH DAILY. 90 tablet 3  . metoprolol succinate (TOPROL-XL) 50 MG 24 hr tablet Take 1 tablet (50 mg total) by mouth daily. 90 tablet 3  . nitroGLYCERIN (NITROSTAT) 0.4 MG SL tablet PLACE 1 TABLET UNDER THE TONGUE EVERY 5 MINUTES AS NEEDED FOR CHEST PAIN 25 tablet 6  . PROLENSA 0.07 % SOLN INSTILL 1 DROP INTO LEFT EYE ONCE A DAY 2 DAYS BEFORE SURGERY  0  . rosuvastatin (CRESTOR) 10 MG tablet TAKE 1 TABLET BY MOUTH 3 TIMES A WEEK. 45 tablet 3  . levothyroxine (SYNTHROID, LEVOTHROID) 150 MCG tablet Take 1 tablet (150 mcg total) by mouth daily. 90 tablet 1  . moxifloxacin (VIGAMOX) 0.5 % ophthalmic solution INSTILL 1 DROP IN LEFT EYE STARTING 2 DAYS PRIOR TO SURGERY AND 2 DROPS MORNING OF SURGERY  0  . oseltamivir (TAMIFLU) 75 MG capsule Take 1 capsule (75 mg total) by mouth every 12 (twelve) hours. 9 capsule 0   No facility-administered medications prior to visit.     ROS Review of Systems  Constitutional: Negative.  Negative for appetite change, diaphoresis, fatigue and unexpected weight change.  HENT: Negative.   Eyes: Negative.   Respiratory: Negative.  Negative for cough, chest tightness, shortness of breath, wheezing and stridor.   Cardiovascular:  Negative for chest pain, palpitations and leg swelling.  Gastrointestinal: Negative for abdominal pain, constipation, diarrhea, nausea and vomiting.  Endocrine: Negative.  Negative for cold intolerance, heat intolerance, polydipsia, polyphagia and polyuria.  Genitourinary: Negative.  Negative for difficulty urinating, dysuria and hematuria.  Musculoskeletal: Positive for back pain. Negative for arthralgias and myalgias.  Skin: Negative.  Negative for color change and rash.  Allergic/Immunologic: Negative.   Neurological: Negative.  Negative for dizziness, weakness and numbness.  Hematological: Negative for adenopathy. Does not bruise/bleed easily.  Psychiatric/Behavioral: Negative.     Objective:  BP 124/80 (BP Location: Left Arm, Patient Position: Sitting, Cuff Size: Large)   Pulse 76   Temp 97.8 F (36.6 C) (Oral)   Resp 16   Ht 5\' 10"  (1.778 m)   Wt 242 lb 8 oz (110 kg)   SpO2 98%   BMI 34.80 kg/m   BP Readings from Last 3 Encounters:  09/07/16 124/80  07/01/16 130/86  06/14/16 120/74    Wt Readings from Last 3 Encounters:  09/07/16 242 lb 8 oz (110 kg)  07/01/16 252 lb (114.3 kg)  06/14/16 252 lb (114.3 kg)    Physical Exam  Constitutional: He is oriented to person, place, and time. No distress.  HENT:  Mouth/Throat: Oropharynx is clear and moist. No oropharyngeal exudate.  Eyes: Conjunctivae are normal. Right eye exhibits no  discharge. Left eye exhibits no discharge. No scleral icterus.  Neck: Normal range of motion. Neck supple. No JVD present. No thyromegaly present.  Cardiovascular: Normal rate, regular rhythm and intact distal pulses.  Exam reveals no gallop.   No murmur heard. Pulmonary/Chest: Effort normal and breath sounds normal. No respiratory distress. He has no wheezes. He has no rales. He exhibits no tenderness.  Abdominal: Soft. Bowel sounds are normal. He exhibits no distension and no mass. There is no tenderness. There is no rebound and no guarding.    Musculoskeletal: Normal range of motion. He exhibits no edema, tenderness or deformity.  Lymphadenopathy:    He has no cervical adenopathy.  Neurological: He is alert and oriented to person, place, and time.  Skin: Skin is warm and dry. No rash noted. He is not diaphoretic. No erythema. No pallor.  Vitals reviewed.   Lab Results  Component Value Date   WBC 6.4 03/31/2016   HGB 16.9 03/31/2016   HCT 49.7 03/31/2016   PLT 135 (L) 03/31/2016   GLUCOSE 105 (H) 09/07/2016   CHOL 172 12/31/2015   TRIG 77.0 12/31/2015   HDL 46.80 12/31/2015   LDLCALC 110 (H) 12/31/2015   ALT 13 (L) 03/31/2016   AST 23 03/31/2016   NA 141 09/07/2016   K 4.1 09/07/2016   CL 109 09/07/2016   CREATININE 1.56 (H) 09/07/2016   BUN 21 09/07/2016   CO2 24 09/07/2016   TSH 0.22 (L) 09/07/2016   PSA 1.78 08/05/2015   INR 0.97 10/02/2013   HGBA1C 6.1 09/07/2016   MICROALBUR 13.7 (H) 12/31/2015    Dg Chest 2 View  Result Date: 03/31/2016 CLINICAL DATA:  Productive cough for 3 days with fever, diarrhea, and midline chest pain, hypertension; past history of non-Hodgkins lymphoma, coronary artery disease post MI EXAM: CHEST  2 VIEW COMPARISON:  10/02/2013 FINDINGS: Interval removal of LEFT subclavian Port-A-Cath. Normal heart size post CABG. Mediastinal contours and pulmonary vascularity normal. Mild RIGHT basilar atelectasis. No acute infiltrate, pleural effusion, or pneumothorax. Bones demineralized. IMPRESSION: Mild RIGHT basilar atelectasis. Post CABG. Electronically Signed   By: Devon Brady M.D.   On: 03/31/2016 09:57    Assessment & Plan:   [name] was seen today for hypothyroidism.  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 -     POCT glycosylated hemoglobin (Hb A1C)  Hypothyroidism, postsurgical- his TSH remains suppressed so I have asked him to lower his levothyroxine dose from 150 g  a day to 100 g a day. -     TSH; Future -     levothyroxine (SYNTHROID, LEVOTHROID) 100 MCG tablet; Take 1 tablet (100 mcg total) by mouth daily.  Prediabetes -     Basic metabolic panel; Future  Renal insufficiency- his renal function is stable, I've encouraged him to avoid nephrotoxic agents. -     Basic metabolic panel; Future   I have discontinued Mr. Koury levothyroxine, oseltamivir, and moxifloxacin. I am also having him start on levothyroxine. Additionally, I am having him maintain his aspirin EC, nitroGLYCERIN, PROLENSA, DUREZOL, metoprolol succinate, rosuvastatin, and ezetimibe.  Meds ordered this encounter  Medications  . levothyroxine (SYNTHROID, LEVOTHROID) 100 MCG tablet    Sig: Take 1 tablet (100 mcg total) by mouth daily.    Dispense:  90 tablet    Refill:  1     Follow-up: Return in about 6 months (around 03/09/2017).  Scarlette Calico, MD

## 2016-10-29 ENCOUNTER — Other Ambulatory Visit (HOSPITAL_BASED_OUTPATIENT_CLINIC_OR_DEPARTMENT_OTHER): Payer: 59

## 2016-10-29 ENCOUNTER — Ambulatory Visit (HOSPITAL_BASED_OUTPATIENT_CLINIC_OR_DEPARTMENT_OTHER): Payer: 59 | Admitting: Hematology and Oncology

## 2016-10-29 ENCOUNTER — Telehealth: Payer: Self-pay | Admitting: Hematology and Oncology

## 2016-10-29 ENCOUNTER — Encounter: Payer: Self-pay | Admitting: Hematology and Oncology

## 2016-10-29 DIAGNOSIS — IMO0001 Reserved for inherently not codable concepts without codable children: Secondary | ICD-10-CM

## 2016-10-29 DIAGNOSIS — E669 Obesity, unspecified: Secondary | ICD-10-CM | POA: Diagnosis not present

## 2016-10-29 DIAGNOSIS — C73 Malignant neoplasm of thyroid gland: Secondary | ICD-10-CM

## 2016-10-29 DIAGNOSIS — Z8571 Personal history of Hodgkin lymphoma: Secondary | ICD-10-CM

## 2016-10-29 DIAGNOSIS — Z6835 Body mass index (BMI) 35.0-35.9, adult: Secondary | ICD-10-CM

## 2016-10-29 LAB — CBC WITH DIFFERENTIAL/PLATELET
BASO%: 1 % (ref 0.0–2.0)
BASOS ABS: 0.1 10*3/uL (ref 0.0–0.1)
EOS%: 2.5 % (ref 0.0–7.0)
Eosinophils Absolute: 0.1 10*3/uL (ref 0.0–0.5)
HEMATOCRIT: 48.7 % (ref 38.4–49.9)
HEMOGLOBIN: 16 g/dL (ref 13.0–17.1)
LYMPH#: 1.8 10*3/uL (ref 0.9–3.3)
LYMPH%: 32.1 % (ref 14.0–49.0)
MCH: 29.1 pg (ref 27.2–33.4)
MCHC: 32.8 g/dL (ref 32.0–36.0)
MCV: 88.8 fL (ref 79.3–98.0)
MONO#: 0.5 10*3/uL (ref 0.1–0.9)
MONO%: 8.1 % (ref 0.0–14.0)
NEUT#: 3.2 10*3/uL (ref 1.5–6.5)
NEUT%: 56.3 % (ref 39.0–75.0)
PLATELETS: 175 10*3/uL (ref 140–400)
RBC: 5.49 10*6/uL (ref 4.20–5.82)
RDW: 15 % — AB (ref 11.0–14.6)
WBC: 5.6 10*3/uL (ref 4.0–10.3)

## 2016-10-29 LAB — COMPREHENSIVE METABOLIC PANEL
ALBUMIN: 3.8 g/dL (ref 3.5–5.0)
ALT: 18 U/L (ref 0–55)
ANION GAP: 8 meq/L (ref 3–11)
AST: 24 U/L (ref 5–34)
Alkaline Phosphatase: 66 U/L (ref 40–150)
BUN: 21.7 mg/dL (ref 7.0–26.0)
CALCIUM: 9.5 mg/dL (ref 8.4–10.4)
CHLORIDE: 110 meq/L — AB (ref 98–109)
CO2: 25 meq/L (ref 22–29)
CREATININE: 1.6 mg/dL — AB (ref 0.7–1.3)
EGFR: 51 mL/min/{1.73_m2} — ABNORMAL LOW (ref >90)
GLUCOSE: 114 mg/dL (ref 70–140)
POTASSIUM: 4.2 meq/L (ref 3.5–5.1)
Sodium: 144 mEq/L (ref 136–145)
TOTAL PROTEIN: 7 g/dL (ref 6.4–8.3)
Total Bilirubin: 0.45 mg/dL (ref 0.20–1.20)

## 2016-10-29 LAB — LACTATE DEHYDROGENASE: LDH: 191 U/L (ref 125–245)

## 2016-10-29 NOTE — Assessment & Plan Note (Signed)
He has no signs of disease recurrence. Last thyroid scan was negative. Defer follow-up to his endocrinologist

## 2016-10-29 NOTE — Progress Notes (Signed)
[facility] OFFICE PROGRESS NOTE  Patient Care Team: Jones, Thomas L, MD as PCP - General (Internal Medicine) Goldsborough, Kellie, MD as Consulting Physician (Nephrology) Balan, Bindubal, MD as Consulting Physician (Endocrinology) Rosen, Jefry, MD as Consulting Physician (Otolaryngology) Magod, Marc, MD as Consulting Physician (Gastroenterology) Gorsuch, Ni, MD as Consulting Physician (Hematology and Oncology)  SUMMARY OF ONCOLOGIC HISTORY: Oncology History   Hodgkin's lymphoma, nodular sclerosing   Primary site: Lymphoid Neoplasms (Bilateral)   Clinical: Stage IV signed by Ni Gorsuch, MD on 05/07/2013  3:56 PM   Pathologic: Stage IV signed by Ni Gorsuch, MD on 05/07/2013  3:56 PM   Summary: Stage IV Primary thyroid papillary carcinoma   Primary site: Thyroid - Papillary or Follicular (45 years and older) (Left)   Staging method: AJCC 7th Edition   Clinical: Stage I (T1, N0, M0) signed by Ni Gorsuch, MD on 05/07/2013  3:48 PM   Pathologic: Stage I (T1, N0, cM0) signed by Ni Gorsuch, MD on 05/07/2013  3:48 PM   Summary: Stage I (T1, N0, cM0)       History of Hodgkin's lymphoma   05/28/2011 Imaging    CT scan of the chest, abdomen and pelvis shows significant and diffuse lymphadenopathy      06/07/2011 Surgery    Lymph node biopsy from the right inguinal region came back positive for nodular sclerosing Hodgkin lymphoma      06/11/2011 - 11/12/2011 Chemotherapy    The patient received treatment with ABVD for Hodgkin's lymphoma. 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       06/11/2011 Imaging    PET CT scan show bilateral cervical, axillary, inguinal, retroperitoneal lymphadenopathy and bone involvement      06/11/2011 Bone Marrow Biopsy    Bone marrow biopsy was negative for recurrence      08/05/2011 Imaging    Repeat PET scan show marked reduction in the lymphadenopathy       11/25/2011 Imaging    PET CT scan show almost near complete resolution of disease      03/31/2012 Surgery    The patient underwent thyroid resection which show papillary thyroid cancer      05/15/2012 Imaging    Repeat CT scan show complete resolution of all disease      08/30/2012 - 08/30/2012 Radiation Therapy    The patient received radioactive iodine treatment for thyroid cancer      11/17/2012 Imaging    Repeat CT scan showed complete resolution of all disease      05/07/2013 Imaging    Repeat CT scan show no evidence of disease recurrence       INTERVAL HISTORY: Please see below for problem oriented charting. He returns for further follow-up He feels well Denies recent infection No new lymphadenopathy Denies anorexia or weight loss.  REVIEW OF SYSTEMS:   Constitutional: Denies fevers, chills or abnormal weight loss Eyes: Denies blurriness of vision Ears, nose, mouth, throat, and face: Denies mucositis or sore throat Respiratory: Denies cough, dyspnea or wheezes Cardiovascular: Denies palpitation, chest discomfort or lower extremity swelling Gastrointestinal:  Denies nausea, heartburn or change in bowel habits Skin: Denies abnormal skin rashes Lymphatics: Denies new lymphadenopathy or easy bruising Neurological:Denies numbness, tingling or new weaknesses Behavioral/Psych: Mood is stable, no new changes  All other systems were reviewed with the patient and are negative.  I have reviewed the past medical history, past surgical history, social history and   family history with the patient and they are unchanged from previous note.  ALLERGIES:  is allergic to crestor [rosuvastatin] and cialis [tadalafil].  MEDICATIONS:  Current Outpatient Prescriptions  Medication Sig Dispense Refill  . aspirin EC 81 MG tablet Take 81 mg by mouth every evening.     . ezetimibe (ZETIA) 10 MG tablet TAKE 1 TABLET (10 MG TOTAL) BY MOUTH DAILY. 90 tablet 3  . levothyroxine (SYNTHROID,  LEVOTHROID) 100 MCG tablet Take 1 tablet (100 mcg total) by mouth daily. 90 tablet 1  . metoprolol succinate (TOPROL-XL) 50 MG 24 hr tablet Take 1 tablet (50 mg total) by mouth daily. 90 tablet 3  . nitroGLYCERIN (NITROSTAT) 0.4 MG SL tablet PLACE 1 TABLET UNDER THE TONGUE EVERY 5 MINUTES AS NEEDED FOR CHEST PAIN 25 tablet 6  . rosuvastatin (CRESTOR) 10 MG tablet TAKE 1 TABLET BY MOUTH 3 TIMES A WEEK. 45 tablet 3   No current facility-administered medications for this visit.     PHYSICAL EXAMINATION: ECOG PERFORMANCE STATUS: 0 - Asymptomatic  Vitals:   10/29/16 0850  BP: 135/84  Pulse: 61  Resp: 20  Temp: 97.9 F (36.6 C)  SpO2: 99%   Filed Weights   10/29/16 0850  Weight: 241 lb 11.2 oz (109.6 kg)    GENERAL:alert, no distress and comfortable.  He is morbidly obese SKIN: skin color, texture, turgor are normal, no rashes or significant lesions EYES: normal, Conjunctiva are pink and non-injected, sclera clear OROPHARYNX:no exudate, no erythema and lips, buccal mucosa, and tongue normal  NECK: Well-healed thyroidectomy scar.   LYMPH:  no palpable lymphadenopathy in the cervical, axillary or inguinal LUNGS: clear to auscultation and percussion with normal breathing effort HEART: regular rate & rhythm and no murmurs and no lower extremity edema ABDOMEN:abdomen soft, non-tender and normal bowel sounds Musculoskeletal:no cyanosis of digits and no clubbing  NEURO: alert & oriented x 3 with fluent speech, no focal motor/sensory deficits  LABORATORY DATA:  I have reviewed the data as listed    Component Value Date/Time   NA 144 10/29/2016 0838   K 4.2 10/29/2016 0838   CL 109 09/07/2016 0923   CL 106 05/15/2012 0815   CO2 25 10/29/2016 0838   GLUCOSE 114 10/29/2016 0838   GLUCOSE 106 (H) 05/15/2012 0815   BUN 21.7 10/29/2016 0838   CREATININE 1.6 (H) 10/29/2016 0838   CALCIUM 9.5 10/29/2016 0838   PROT 7.0 10/29/2016 0838   ALBUMIN 3.8 10/29/2016 0838   AST 24 10/29/2016  0838   ALT 18 10/29/2016 0838   ALKPHOS 66 10/29/2016 0838   BILITOT 0.45 10/29/2016 0838   GFRNONAA 35 (L) 03/31/2016 0933   GFRAA 41 (L) 03/31/2016 0933    No results found for: SPEP, UPEP  Lab Results  Component Value Date   WBC 5.6 10/29/2016   NEUTROABS 3.2 10/29/2016   HGB 16.0 10/29/2016   HCT 48.7 10/29/2016   MCV 88.8 10/29/2016   PLT 175 10/29/2016      Chemistry      Component Value Date/Time   NA 144 10/29/2016 0838   K 4.2 10/29/2016 0838   CL 109 09/07/2016 0923   CL 106 05/15/2012 0815   CO2 25 10/29/2016 0838   BUN 21.7 10/29/2016 0838   CREATININE 1.6 (H) 10/29/2016 0838      Component Value Date/Time   CALCIUM 9.5 10/29/2016 0838   ALKPHOS 66 10/29/2016 0838   AST 24 10/29/2016 0838   ALT 18 10/29/2016 0838   BILITOT   0.45 10/29/2016 0838     ASSESSMENT & PLAN:  History of hodgkin's lymphoma He has no signs of disease recurrence. Clinical exam, blood work and his last imaging study were normal. The patient is a long-term cancer survivor I recommend discontinuation of follow-up here and just follow-up with primary care doctor only   Primary thyroid papillary carcinoma He has no signs of disease recurrence. Last thyroid scan was negative. Defer follow-up to his endocrinologist  Obesity, Class II, BMI 35-39.9, with comorbidity (HCC) The patient is obese with significant cardiovascular disease & risk factors. He appears to be motivated to lose weight and make the commitment to lose weight I recommend him to get enroll in the [facility] for structured weight loss program   No orders of the defined types were placed in this encounter.  All questions were answered. The patient knows to call the clinic with any problems, questions or concerns. No barriers to learning was detected. I spent 15 minutes counseling the patient face to face. The total time spent in the appointment was 20 minutes and more than 50% was on counseling and review of test  results     Ni Gorsuch, MD 10/29/2016 9:52 AM  

## 2016-10-29 NOTE — Assessment & Plan Note (Signed)
The patient is obese with significant cardiovascular disease & risk factors. He appears to be motivated to lose weight and make the commitment to lose weight I recommend him to get enroll in the [facility] for structured weight loss program

## 2016-10-29 NOTE — Assessment & Plan Note (Signed)
He has no signs of disease recurrence. Clinical exam, blood work and his last imaging study were normal. The patient is a long-term cancer survivor I recommend discontinuation of follow-up here and just follow-up with primary care doctor only

## 2016-10-29 NOTE — Telephone Encounter (Signed)
Per 8/17 los - no additional appts added. -Return for No new orders.

## 2016-12-31 ENCOUNTER — Ambulatory Visit (INDEPENDENT_AMBULATORY_CARE_PROVIDER_SITE_OTHER): Payer: Managed Care, Other (non HMO) | Admitting: Ophthalmology

## 2016-12-31 DIAGNOSIS — H35033 Hypertensive retinopathy, bilateral: Secondary | ICD-10-CM

## 2016-12-31 DIAGNOSIS — H35372 Puckering of macula, left eye: Secondary | ICD-10-CM | POA: Diagnosis not present

## 2016-12-31 DIAGNOSIS — H353111 Nonexudative age-related macular degeneration, right eye, early dry stage: Secondary | ICD-10-CM

## 2016-12-31 DIAGNOSIS — I10 Essential (primary) hypertension: Secondary | ICD-10-CM

## 2016-12-31 DIAGNOSIS — H43813 Vitreous degeneration, bilateral: Secondary | ICD-10-CM

## 2016-12-31 LAB — TSH: TSH: 19.84 — AB (ref <=5.90)

## 2017-01-12 ENCOUNTER — Encounter: Payer: Self-pay | Admitting: Internal Medicine

## 2017-01-26 ENCOUNTER — Ambulatory Visit (INDEPENDENT_AMBULATORY_CARE_PROVIDER_SITE_OTHER)
Admission: RE | Admit: 2017-01-26 | Discharge: 2017-01-26 | Disposition: A | Discharge status: Home / Self Care | Payer: 59 | Source: Ambulatory Visit | Attending: Internal Medicine | Admitting: Internal Medicine

## 2017-01-26 ENCOUNTER — Ambulatory Visit (INDEPENDENT_AMBULATORY_CARE_PROVIDER_SITE_OTHER): Payer: 59 | Admitting: Internal Medicine

## 2017-01-26 ENCOUNTER — Encounter: Payer: Self-pay | Admitting: Internal Medicine

## 2017-01-26 ENCOUNTER — Other Ambulatory Visit (INDEPENDENT_AMBULATORY_CARE_PROVIDER_SITE_OTHER): Payer: 59

## 2017-01-26 VITALS — BP 138/88 | HR 78 | Temp 98.8°F | Ht 71.0 in | Wt 248.1 lb

## 2017-01-26 DIAGNOSIS — R059 Cough, unspecified: Secondary | ICD-10-CM

## 2017-01-26 DIAGNOSIS — E669 Obesity, unspecified: Secondary | ICD-10-CM | POA: Diagnosis not present

## 2017-01-26 DIAGNOSIS — Z0001 Encounter for general adult medical examination with abnormal findings: Secondary | ICD-10-CM | POA: Diagnosis not present

## 2017-01-26 DIAGNOSIS — Z0000 Encounter for general adult medical examination without abnormal findings: Secondary | ICD-10-CM

## 2017-01-26 DIAGNOSIS — E785 Hyperlipidemia, unspecified: Secondary | ICD-10-CM

## 2017-01-26 DIAGNOSIS — N400 Enlarged prostate without lower urinary tract symptoms: Secondary | ICD-10-CM | POA: Diagnosis not present

## 2017-01-26 DIAGNOSIS — R05 Cough: Secondary | ICD-10-CM

## 2017-01-26 DIAGNOSIS — J0100 Acute maxillary sinusitis, unspecified: Secondary | ICD-10-CM | POA: Diagnosis not present

## 2017-01-26 LAB — LIPID PANEL
CHOLESTEROL: 166 mg/dL (ref 0–200)
HDL: 51.6 mg/dL (ref >39.00)
LDL CALC: 99 mg/dL (ref 0–99)
NonHDL: 114.66
Total CHOL/HDL Ratio: 3
Triglycerides: 77 mg/dL (ref 0.0–149.0)
VLDL: 15.4 mg/dL (ref 0.0–40.0)

## 2017-01-26 LAB — PSA: PSA: 3.44 ng/mL (ref 0.10–4.00)

## 2017-01-26 MED ORDER — HYDROCODONE-HOMATROPINE 5-1.5 MG/5ML PO SYRP: 5.0000 mL | ORAL_SOLUTION | Freq: Three times a day (TID) | ORAL | 0 refills | Status: DC | PRN

## 2017-01-26 MED ORDER — CEFDINIR 300 MG PO CAPS: 300.0000 mg | ORAL_CAPSULE | Freq: Two times a day (BID) | ORAL | 0 refills | Status: AC

## 2017-01-26 NOTE — Progress Notes (Signed)
Subjective:  Patient ID: Devon Brady, male    DOB: 03-29-1950  Age: 66 y.o. MRN: [id number]  CC: Annual Exam (Patient is here today for an annual exam. He is currently fasting.) and Cough (He is also C/O a cough x3d.  Per the pharmacist he has been taking Zyrtec which he started last hs.  Yesterday evening he was so cold and achey that he went to bed.  He has periorbital sinus pressure.  Has been using Netti-Pot 3 times now. Cough is productive with greenish-yellow sputum.  Post-nasal drip causing throat soreness. Also states otalgia but AD is worse than AS.)   HPI Devon Brady presents for a CPX.  He complains of a 3-day history of sore throat with chills, cough productive of yellow phlegm, facial pain, right-sided earache, and thick yellow phlegm from his nose.  Past Medical History:  Diagnosis Date  . CAD (coronary artery disease)   . H/O colonoscopy 2013  . H/O hiatal hernia   . Hemorrhoid   . History of colon polyps   . Hodgkin's lymphoma ([place])   . Hydronephrosis   . Hyperlipidemia   . Hypertension   . Hypothyroidism   . Myocardial infarction [facility] [place]) 2011   "mild one, before the CABG"  . 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 Nocturia   . Papillary thyroid carcinoma ([place]) 03/2012   s/p total thyroidectomy  . Renal insufficiency   . Sleep apnea    No CPAP  . TIA (transient ischemic attack) 2011   Past Surgical History:  Procedure Laterality Date  . BONE MARROW ASPIRATE,BIOPSY, AND CLOT  06/11/11   LEFT ILIAC CREAST  . BUNIONECTOMY Right 1978   foot  . CARDIAC CATHETERIZATION  10/15/2009  . COLONOSCOPY    . CORONARY ARTERY BYPASS GRAFT  10/17/2009   LIMA to LAD,SVG to Ramus,SVG to OM sequential to OM2, SVG to marginal of RCA  . ESOPHAGOGASTRODUODENOSCOPY    . LEFT HEART CATHETERIZATION WITH CORONARY/GRAFT ANGIOGRAM N/A 10/03/2013   Procedure: LEFT HEART CATHETERIZATION WITH Beatrix Fetters;  Surgeon: Sinclair Grooms, MD;   Location: [facility] CATH LAB;  Service: Cardiovascular;  Laterality: N/A;  . LYMPH NODE BIOPSY  06/07/11   RIGHT INGUINAL NODE: CLASSICAL HODGKIN'S LYMPHOMA, NODULAR SCLEROSIS TYPE  . PORT-A-CATH REMOVAL Left 12/06/2013   Procedure: REMOVAL PORT-A-CATH;  Surgeon: [name], MD;  Location: [facility];  Service: General;  Laterality: Left;  . PORTACATH PLACEMENT  06/10/2011   Procedure: INSERTION PORT-A-CATH;  Surgeon: [name], MD;  Location: [facility];  Service: General;  Laterality: Left;  subclavian   . THYROIDECTOMY  03/31/2012   Procedure: THYROIDECTOMY;  Surgeon: Izora Gala, MD;  Location: [facility];  Service: ENT;  Laterality: N/A;  . TONSILLECTOMY     as a child  . VASECTOMY  1980    reports that  has never smoked. he has never used smokeless tobacco. He reports that he drinks alcohol. He reports that he does not use drugs. family history includes Alcohol abuse in his brother; Arthritis in his mother; Coronary artery disease (age of onset: 29) in his father; Diabetes in his father; Hypertension in his mother, sister, and sister; Kidney disease in his father; Prostate cancer in his paternal uncle. Allergies  Allergen Reactions  . Crestor [Rosuvastatin]     Muscle aches  . Cialis [Tadalafil]     headache    Outpatient Medications Prior to Visit  Medication Sig Dispense Refill  .  aspirin EC 81 MG tablet Take 81 mg by mouth every evening.     . ezetimibe (ZETIA) 10 MG tablet TAKE 1 TABLET (10 MG TOTAL) BY MOUTH DAILY. 90 tablet 3  . levothyroxine (SYNTHROID, LEVOTHROID) 125 MCG tablet Take 125 mcg daily by mouth.  3  . metoprolol succinate (TOPROL-XL) 50 MG 24 hr tablet Take 1 tablet (50 mg total) by mouth daily. 90 tablet 3  . nitroGLYCERIN (NITROSTAT) 0.4 MG SL tablet PLACE 1 TABLET UNDER THE TONGUE EVERY 5 MINUTES AS NEEDED FOR CHEST PAIN 25 tablet 6  . rosuvastatin (CRESTOR) 10 MG tablet TAKE 1 TABLET BY MOUTH 3 TIMES A WEEK. 45 tablet 3  . levothyroxine (SYNTHROID,  LEVOTHROID) 100 MCG tablet Take 1 tablet (100 mcg total) by mouth daily. 90 tablet 1   No facility-administered medications prior to visit.     ROS Review of Systems  Constitutional: Positive for chills. Negative for appetite change, diaphoresis, fatigue, fever and unexpected weight change.  HENT: Positive for postnasal drip, rhinorrhea, sinus pressure, sinus pain and sore throat. Negative for facial swelling, sneezing, tinnitus and trouble swallowing.   Respiratory: Positive for cough. Negative for chest tightness, shortness of breath, wheezing and stridor.   Cardiovascular: Negative.  Negative for chest pain, palpitations and leg swelling.  Gastrointestinal: Negative for abdominal pain, constipation, diarrhea, nausea and vomiting.  Endocrine: Negative.   Genitourinary: Negative.  Negative for difficulty urinating.  Musculoskeletal: Negative.  Negative for back pain, myalgias and neck pain.  Skin: Negative.  Negative for color change and rash.  Allergic/Immunologic: Negative.   Neurological: Negative.  Negative for dizziness, weakness and light-headedness.  Hematological: Negative for adenopathy. Does not bruise/bleed easily.  Psychiatric/Behavioral: Negative.     Objective:  BP 138/88 (BP Location: Right Arm, Patient Position: Sitting, Cuff Size: Normal)   Pulse 78   Temp 98.8 F (37.1 C) (Oral)   Ht 5\' 11"  (1.803 m)   Wt 248 lb 1.3 oz (112.5 kg)   SpO2 96%   BMI 34.60 kg/m   BP Readings from Last 3 Encounters:  01/26/17 138/88  10/29/16 135/84  09/07/16 124/80    Wt Readings from Last 3 Encounters:  01/26/17 248 lb 1.3 oz (112.5 kg)  10/29/16 241 lb 11.2 oz (109.6 kg)  09/07/16 242 lb 8 oz (110 kg)    Physical Exam  Constitutional: He is oriented to person, place, and time.  Non-toxic appearance. He does not have a sickly appearance. He does not appear ill. No distress.  HENT:  Right Ear: Hearing, tympanic membrane, external ear and ear canal normal.  Left Ear:  Hearing, tympanic membrane, external ear and ear canal normal.  Nose: Rhinorrhea present. No mucosal edema or sinus tenderness. No epistaxis. Right sinus exhibits maxillary sinus tenderness. Right sinus exhibits no frontal sinus tenderness. Left sinus exhibits maxillary sinus tenderness. Left sinus exhibits no frontal sinus tenderness.  Mouth/Throat: Oropharynx is clear and moist. Mucous membranes are not pale, not dry and not cyanotic. No oral lesions. No trismus in the jaw. No uvula swelling. No oropharyngeal exudate, posterior oropharyngeal edema, posterior oropharyngeal erythema or tonsillar abscesses.  Eyes: Conjunctivae are normal. Right eye exhibits no discharge. Left eye exhibits no discharge. No scleral icterus.  Neck: Normal range of motion. Neck supple. No JVD present. No thyromegaly present.  Cardiovascular: Normal rate, regular rhythm and intact distal pulses. Exam reveals no gallop.  No murmur heard. Pulmonary/Chest: Effort normal and breath sounds normal. No respiratory distress. He has no wheezes. He has  no rales. He exhibits no tenderness.  Abdominal: Soft. Bowel sounds are normal. He exhibits no distension and no mass. There is no tenderness. There is no rebound and no guarding.  Musculoskeletal: Normal range of motion. He exhibits no edema, tenderness or deformity.  Lymphadenopathy:    He has no cervical adenopathy.  Neurological: He is oriented to person, place, and time.  Skin: Skin is warm and dry. No rash noted. He is not diaphoretic. No erythema. No pallor.  Vitals reviewed.   Lab Results  Component Value Date   WBC 5.6 10/29/2016   HGB 16.0 10/29/2016   HCT 48.7 10/29/2016   PLT 175 10/29/2016   GLUCOSE 114 10/29/2016   CHOL 166 01/26/2017   TRIG 77.0 01/26/2017   HDL 51.60 01/26/2017   LDLCALC 99 01/26/2017   ALT 18 10/29/2016   AST 24 10/29/2016   NA 144 10/29/2016   K 4.2 10/29/2016   CL 109 09/07/2016   CREATININE 1.6 (H) 10/29/2016   BUN 21.7 10/29/2016    CO2 25 10/29/2016   TSH 19.84 (A) 12/31/2016   PSA 3.44 01/26/2017   INR 0.97 10/02/2013   HGBA1C 6.1 09/07/2016   MICROALBUR 13.7 (H) 12/31/2015    Dg Chest 2 View  Result Date: 03/31/2016 CLINICAL DATA:  Productive cough for 3 days with fever, diarrhea, and midline chest pain, hypertension; past history of non-Hodgkins lymphoma, coronary artery disease post MI EXAM: CHEST  2 VIEW COMPARISON:  10/02/2013 FINDINGS: Interval removal of LEFT subclavian Port-A-Cath. Normal heart size post CABG. Mediastinal contours and pulmonary vascularity normal. Mild RIGHT basilar atelectasis. No acute infiltrate, pleural effusion, or pneumothorax. Bones demineralized. IMPRESSION: Mild RIGHT basilar atelectasis. Post CABG. Electronically Signed   By: Lavonia Dana M.D.   On: 03/31/2016 09:57    Assessment & Plan:   Telvin was seen today for annual exam and cough.  Diagnoses and all orders for this visit:  Cough- His chest x-ray is normal.  We will treat the infection with Omnicef. -     DG Chest 2 View; Future -     HYDROcodone-homatropine (HYCODAN) 5-1.5 MG/5ML syrup; Take 5 mLs every 8 (eight) hours as needed by mouth for cough.  Benign prostatic hyperplasia without lower urinary tract symptoms- His PSA has risen some over the last year so I have asked him to return in 3-4 months for a recheck.  If the PSA continues to rise then we will consider referral to urology for consideration of possible prostate biopsy.  He has no symptoms that need to be treated. -     PSA; Future  Hyperlipidemia with target LDL less than 100- He has achieved his LDL goal and is doing well on the statin. -     Lipid panel; Future  Obesity (BMI 35.0-39.9 without comorbidity)- He agrees to work on his lifestyle modifications to lose weight.  Routine general medical examination at a health care facility  Acute non-recurrent maxillary sinusitis -     cefdinir (OMNICEF) 300 MG capsule; Take 1 capsule (300 mg total) 2 (two)  times daily for 10 days by mouth.   I am having Kiev B. Sample start on cefdinir and HYDROcodone-homatropine. I am also having him maintain his aspirin EC, nitroGLYCERIN, metoprolol succinate, rosuvastatin, ezetimibe, and levothyroxine.  Meds ordered this encounter  Medications  . levothyroxine (SYNTHROID, LEVOTHROID) 125 MCG tablet    Sig: Take 125 mcg daily by mouth.    Refill:  3  . cefdinir (OMNICEF) 300 MG capsule  Sig: Take 1 capsule (300 mg total) 2 (two) times daily for 10 days by mouth.    Dispense:  20 capsule    Refill:  0  . HYDROcodone-homatropine (HYCODAN) 5-1.5 MG/5ML syrup    Sig: Take 5 mLs every 8 (eight) hours as needed by mouth for cough.    Dispense:  120 mL    Refill:  0   See AVS for instructions about healthy living and anticipatory guidance.  Follow-up: Return if symptoms worsen or fail to improve.  Scarlette Calico, MD

## 2017-01-26 NOTE — Patient Instructions (Signed)

## 2017-01-27 ENCOUNTER — Encounter: Payer: Self-pay | Admitting: Internal Medicine

## 2017-01-27 NOTE — Assessment & Plan Note (Signed)

## 2017-02-08 DIAGNOSIS — J069 Acute upper respiratory infection, unspecified: Secondary | ICD-10-CM | POA: Insufficient documentation

## 2017-03-01 ENCOUNTER — Other Ambulatory Visit: Payer: Self-pay | Admitting: Cardiovascular Disease

## 2017-03-01 DIAGNOSIS — E785 Hyperlipidemia, unspecified: Secondary | ICD-10-CM

## 2017-03-01 MED ORDER — METOPROLOL SUCCINATE ER 50 MG PO TB24: 50.0000 mg | ORAL_TABLET | Freq: Every day | ORAL | 0 refills | Status: DC

## 2017-03-01 MED ORDER — EZETIMIBE 10 MG PO TABS: 10.0000 mg | ORAL_TABLET | Freq: Every day | ORAL | 0 refills | Status: DC

## 2017-03-03 MED ORDER — EZETIMIBE 10 MG PO TABS: 10.0000 mg | ORAL_TABLET | Freq: Every day | ORAL | 0 refills | Status: DC

## 2017-03-03 MED ORDER — ROSUVASTATIN CALCIUM 10 MG PO TABS: ORAL_TABLET | ORAL | 0 refills | Status: DC

## 2017-03-03 MED ORDER — METOPROLOL SUCCINATE ER 50 MG PO TB24: 50.0000 mg | ORAL_TABLET | Freq: Every day | ORAL | 0 refills | Status: DC

## 2017-03-03 NOTE — Telephone Encounter (Signed)
Resent medications to pt's pharmacy, because pharmacy stated that they did not received the medications the first time. Confirmation received.

## 2017-03-03 NOTE — Addendum Note (Signed)
Addended by: Derl Barrow on: 03/03/2017 10:34 AM   Modules accepted: Orders

## 2017-03-13 ENCOUNTER — Other Ambulatory Visit: Payer: Self-pay | Admitting: Internal Medicine

## 2017-03-13 DIAGNOSIS — E890 Postprocedural hypothyroidism: Secondary | ICD-10-CM

## 2017-04-25 ENCOUNTER — Encounter: Payer: Self-pay | Admitting: Internal Medicine

## 2017-04-25 ENCOUNTER — Other Ambulatory Visit (INDEPENDENT_AMBULATORY_CARE_PROVIDER_SITE_OTHER): Payer: 59

## 2017-04-25 ENCOUNTER — Ambulatory Visit (INDEPENDENT_AMBULATORY_CARE_PROVIDER_SITE_OTHER): Payer: 59 | Admitting: Internal Medicine

## 2017-04-25 VITALS — BP 124/70 | HR 61 | Temp 98.4°F | Ht 71.0 in | Wt 243.0 lb

## 2017-04-25 DIAGNOSIS — N289 Disorder of kidney and ureter, unspecified: Secondary | ICD-10-CM

## 2017-04-25 DIAGNOSIS — E538 Deficiency of other specified B group vitamins: Secondary | ICD-10-CM

## 2017-04-25 DIAGNOSIS — R972 Elevated prostate specific antigen [PSA]: Secondary | ICD-10-CM | POA: Diagnosis not present

## 2017-04-25 DIAGNOSIS — R7303 Prediabetes: Secondary | ICD-10-CM

## 2017-04-25 DIAGNOSIS — Z23 Encounter for immunization: Secondary | ICD-10-CM

## 2017-04-25 DIAGNOSIS — N400 Enlarged prostate without lower urinary tract symptoms: Secondary | ICD-10-CM

## 2017-04-25 DIAGNOSIS — E890 Postprocedural hypothyroidism: Secondary | ICD-10-CM | POA: Diagnosis not present

## 2017-04-25 LAB — BASIC METABOLIC PANEL WITH GFR
BUN: 21 mg/dL (ref 6–23)
CO2: 28 meq/L (ref 19–32)
Calcium: 9.6 mg/dL (ref 8.4–10.5)
Chloride: 106 meq/L (ref 96–112)
Creatinine, Ser: 1.48 mg/dL (ref 0.40–1.50)
GFR: 61.25 mL/min
Glucose, Bld: 92 mg/dL (ref 70–99)
Potassium: 4.4 meq/L (ref 3.5–5.1)
Sodium: 141 meq/L (ref 135–145)

## 2017-04-25 LAB — HEMOGLOBIN A1C: HEMOGLOBIN A1C: 6.5 % (ref 4.6–6.5)

## 2017-04-25 MED ORDER — CYANOCOBALAMIN 1000 MCG/ML IJ SOLN: 1000.0000 ug | Freq: Once | INTRAMUSCULAR | Status: DC

## 2017-04-25 NOTE — Patient Instructions (Signed)
Hypothyroidism Hypothyroidism is a disorder of the thyroid. The thyroid is a large gland that is located in the lower front of the neck. The thyroid releases hormones that control how the body works. With hypothyroidism, the thyroid does not make enough of these hormones. What are the causes? Causes of hypothyroidism may include:  Viral infections.  Pregnancy.  Your own defense system (immune system) attacking your thyroid.  Certain medicines.  Birth defects.  Past radiation treatments to your head or neck.  Past treatment with radioactive iodine.  Past surgical removal of part or all of your thyroid.  Problems with the gland that is located in the center of your brain (pituitary).  What are the signs or symptoms? Signs and symptoms of hypothyroidism may include:  Feeling as though you have no energy (lethargy).  Inability to tolerate cold.  Weight gain that is not explained by a change in diet or exercise habits.  Dry skin.  Coarse hair.  Menstrual irregularity.  Slowing of thought processes.  Constipation.  Sadness or depression.  How is this diagnosed? Your health care provider may diagnose hypothyroidism with blood tests and ultrasound tests. How is this treated? Hypothyroidism is treated with medicine that replaces the hormones that your body does not make. After you begin treatment, it may take several weeks for symptoms to go away. Follow these instructions at home:  Take medicines only as directed by your health care provider.  If you start taking any new medicines, tell your health care provider.  Keep all follow-up visits as directed by your health care provider. This is important. As your condition improves, your dosage needs may change. You will need to have blood tests regularly so that your health care provider can watch your condition. Contact a health care provider if:  Your symptoms do not get better with treatment.  You are taking thyroid  replacement medicine and: ? You sweat excessively. ? You have tremors. ? You feel anxious. ? You lose weight rapidly. ? You cannot tolerate heat. ? You have emotional swings. ? You have diarrhea. ? You feel weak. Get help right away if:  You develop chest pain.  You develop an irregular heartbeat.  You develop a rapid heartbeat. This information is not intended to replace advice given to you by your health care provider. Make sure you discuss any questions you have with your health care provider. Document Released: 03/01/2005 Document Revised: 08/07/2015 Document Reviewed: 07/17/2013 Elsevier Interactive Patient Education  2018 [employer]  

## 2017-04-25 NOTE — Progress Notes (Signed)
Subjective:  Patient ID: Devon Brady, male    DOB: [birth year]  Age: 67 y.o. MRN: [id number]  CC: Hypothyroidism   HPI RISHARD DELANGE presents for f/up - He feels well and offers no complaints.  He denies chest pain, shortness of breath, edema, fatigue, or constipation.  Outpatient Medications Prior to Visit  Medication Sig Dispense Refill  . aspirin EC 81 MG tablet Take 81 mg by mouth every evening.     . ezetimibe (ZETIA) 10 MG tablet Take 1 tablet (10 mg total) by mouth daily. Please make yearly appt with Dr. Angelena Form for April before anymore refills. 1st attempt 90 tablet 0  . metoprolol succinate (TOPROL-XL) 50 MG 24 hr tablet Take 1 tablet (50 mg total) by mouth daily. Please make yearly appt with Dr. Angelena Form for April before anymore refills. 1st attempt 90 tablet 0  . rosuvastatin (CRESTOR) 10 MG tablet TAKE 1 TABLET BY MOUTH 3 TIMES A WEEK. Please make yearly appt with Dr.Mcalhany for April before anymore refills. 1st attempt 45 tablet 0  . levothyroxine (SYNTHROID, LEVOTHROID) 125 MCG tablet Take 125 mcg daily by mouth.  3  . nitroGLYCERIN (NITROSTAT) 0.4 MG SL tablet PLACE 1 TABLET UNDER THE TONGUE EVERY 5 MINUTES AS NEEDED FOR CHEST PAIN (Patient not taking: Reported on 04/25/2017) 25 tablet 6   No facility-administered medications prior to visit.     ROS Review of Systems  Constitutional: Negative.  Negative for diaphoresis, fatigue and unexpected weight change.  HENT: Negative.  Negative for trouble swallowing.   Eyes: Negative.   Respiratory: Negative.  Negative for cough, chest tightness, shortness of breath and wheezing.   Cardiovascular: Negative for chest pain, palpitations and leg swelling.  Gastrointestinal: Negative for abdominal pain, constipation, diarrhea and vomiting.  Endocrine: Negative for cold intolerance and heat intolerance.  Genitourinary: Negative.  Negative for difficulty urinating and hematuria.  Musculoskeletal: Negative.  Negative for  back pain and neck pain.  Skin: Negative.  Negative for color change.  Neurological: Negative.  Negative for dizziness, weakness and light-headedness.  Hematological: Negative for adenopathy. Does not bruise/bleed easily.  Psychiatric/Behavioral: Negative.     Objective:  BP 124/70 (BP Location: Left Arm, Patient Position: Sitting, Cuff Size: Normal)   Pulse 61   Temp 98.4 F (36.9 C) (Oral)   Ht 5\' 11"  (1.803 m)   Wt 243 lb (110.2 kg)   SpO2 98%   BMI 33.89 kg/m   BP Readings from Last 3 Encounters:  04/25/17 124/70  01/26/17 138/88  10/29/16 135/84    Wt Readings from Last 3 Encounters:  04/25/17 243 lb (110.2 kg)  01/26/17 248 lb 1.3 oz (112.5 kg)  10/29/16 241 lb 11.2 oz (109.6 kg)    Physical Exam  Constitutional: He is oriented to person, place, and time. No distress.  HENT:  Mouth/Throat: Oropharynx is clear and moist. No oropharyngeal exudate.  Eyes: Conjunctivae are normal. Left eye exhibits no discharge. No scleral icterus.  Neck: Normal range of motion. Neck supple. No JVD present. No thyromegaly present.  Cardiovascular: Normal rate, regular rhythm and normal heart sounds. Exam reveals no gallop.  No murmur heard. Pulmonary/Chest: Effort normal and breath sounds normal. No respiratory distress. He has no wheezes. He has no rales.  Abdominal: Soft. Bowel sounds are normal. He exhibits no distension and no mass. There is no tenderness.  Musculoskeletal: Normal range of motion. He exhibits no edema or tenderness.  Lymphadenopathy:    He has no cervical adenopathy.  Neurological:  He is alert and oriented to person, place, and time.  Skin: Skin is warm and dry. No rash noted. He is not diaphoretic. No erythema. No pallor.  Vitals reviewed.   Lab Results  Component Value Date   WBC 5.6 10/29/2016   HGB 16.0 10/29/2016   HCT 48.7 10/29/2016   PLT 175 10/29/2016   GLUCOSE 92 04/25/2017   CHOL 166 01/26/2017   TRIG 77.0 01/26/2017   HDL 51.60 01/26/2017    LDLCALC 99 01/26/2017   ALT 18 10/29/2016   AST 24 10/29/2016   NA 141 04/25/2017   K 4.4 04/25/2017   CL 106 04/25/2017   CREATININE 1.48 04/25/2017   BUN 21 04/25/2017   CO2 28 04/25/2017   TSH 0.24 (L) 04/28/2017   PSA 1.7 04/26/2017   INR 0.97 10/02/2013   HGBA1C 6.5 04/25/2017   MICROALBUR 13.7 (H) 12/31/2015    Dg Chest 2 View  Result Date: 01/27/2017 CLINICAL DATA:  Cough and congestion. EXAM: CHEST  2 VIEW COMPARISON:  03/31/2016 . FINDINGS: Mediastinum hilar structures are normal. Prior CABG. Heart size normal. Low lung volumes with mild basilar atelectasis. No focal infiltrate. No pleural effusion or pneumothorax IMPRESSION: 1. Prior CABG.  Heart size normal. 2. Low lung volumes with mild basilar atelectasis. Electronically Signed   By: Marcello Moores  Register   On: 01/27/2017 06:45    Assessment & Plan:   Garvey was seen today for hypothyroidism.  Diagnoses and all orders for this visit:  B12 deficiency -     Discontinue: cyanocobalamin ((VITAMIN B-12)) injection 1,000 mcg  Hypothyroidism, postsurgical- His TSH is mildly suppressed so I have asked him to decrease his dose of levothyroxine. -     TSH; Future -     levothyroxine (SYNTHROID, LEVOTHROID) 112 MCG tablet; Take 1 tablet (112 mcg total) by mouth daily.  Renal insufficiency- His renal function has improved slightly.  He agrees to avoid nephrotoxic agents.  I will monitor his urine for proteinuria. -     Basic metabolic panel; Future -     Microalbumin / creatinine urine ratio; Future -     Urinalysis, Routine w reflex microscopic; Future  Prediabetes-his A1c is up to 6.5%.  He is technically diabetic at this time.  Medical therapy is not indicated.  He agrees to improve his lifestyle modifications. -     Basic metabolic panel; Future -     Hemoglobin A1c; Future  Benign prostatic hyperplasia without lower urinary tract symptoms- He has no symptoms that need to be treated. -     PSA, total and free;  Future  PSA elevation- His PSA is normal now. -     PSA, total and free; Future  Other orders -     Pneumococcal polysaccharide vaccine 23-valent greater than or equal to 2yo subcutaneous/IM   I have discontinued Cletus Gash B. Hogg's levothyroxine. I am also having him start on levothyroxine. Additionally, I am having him maintain his aspirin EC, nitroGLYCERIN, ezetimibe, metoprolol succinate, and rosuvastatin.  Meds ordered this encounter  Medications  . DISCONTD: cyanocobalamin ((VITAMIN B-12)) injection 1,000 mcg  . levothyroxine (SYNTHROID, LEVOTHROID) 112 MCG tablet    Sig: Take 1 tablet (112 mcg total) by mouth daily.    Dispense:  90 tablet    Refill:  1     Follow-up: Return in about 4 months (around 08/23/2017).  Scarlette Calico, MD

## 2017-04-26 ENCOUNTER — Encounter: Payer: Self-pay | Admitting: Internal Medicine

## 2017-04-26 LAB — PSA, TOTAL AND FREE
PSA, % Free: 24 % (calc) — ABNORMAL LOW (ref >25)
PSA, Free: 0.4 ng/mL
PSA, Total: 1.7 ng/mL (ref <=4.0)

## 2017-04-26 LAB — PSA: PSA: 1.7

## 2017-04-28 ENCOUNTER — Encounter: Payer: Self-pay | Admitting: Internal Medicine

## 2017-04-28 ENCOUNTER — Telehealth: Payer: Self-pay

## 2017-04-28 LAB — TSH: TSH: 0.24 u[IU]/mL — AB (ref 0.35–4.50)

## 2017-04-28 MED ORDER — LEVOTHYROXINE SODIUM 112 MCG PO TABS: 112.0000 ug | ORAL_TABLET | Freq: Every day | ORAL | 1 refills | Status: DC

## 2017-04-28 NOTE — Telephone Encounter (Signed)
Note from PCP for add on TSH. Reviewed labs and a UA with Micro was also added.   Contacted pt to come in for the add on lab and urine sample. Pt stated understanding and will be able to come in tomorrow.

## 2017-04-29 ENCOUNTER — Other Ambulatory Visit (INDEPENDENT_AMBULATORY_CARE_PROVIDER_SITE_OTHER): Payer: 59

## 2017-04-29 DIAGNOSIS — N289 Disorder of kidney and ureter, unspecified: Secondary | ICD-10-CM

## 2017-04-29 LAB — URINALYSIS, ROUTINE W REFLEX MICROSCOPIC
Bilirubin Urine: NEGATIVE
Ketones, ur: NEGATIVE
Leukocytes, UA: NEGATIVE
Nitrite: NEGATIVE
RBC / HPF: NONE SEEN
Specific Gravity, Urine: 1.02
Urine Glucose: NEGATIVE
Urobilinogen, UA: 0.2
WBC, UA: NONE SEEN
pH: 6 (ref 5.0–8.0)

## 2017-04-29 LAB — MICROALBUMIN / CREATININE URINE RATIO
Creatinine,U: 150.2 mg/dL
MICROALB/CREAT RATIO: 6.8 mg/g (ref 0.0–30.0)
Microalb, Ur: 10.3 mg/dL — ABNORMAL HIGH (ref 0.0–1.9)

## 2017-05-10 ENCOUNTER — Telehealth: Payer: Self-pay | Admitting: Cardiovascular Disease

## 2017-05-10 NOTE — Telephone Encounter (Signed)
New Message   Patient is wanting to know if its okay to consume coconut oil only about a teaspoon once a day. He is trying to find some alternatives to lower triglycerides. A detailed voicemail message can be left or it can be updated in mychart. Marland Kitchen

## 2017-05-10 NOTE — Telephone Encounter (Signed)
Spoke with patient about taking 1 teaspoon of coconut oil per day to reduce tryglicerides.. I informed him that it would be okay to go along as planned..  Pt verbalized understanding.Marland Kitchen

## 2017-05-17 ENCOUNTER — Encounter: Payer: Self-pay | Admitting: Internal Medicine

## 2017-05-17 ENCOUNTER — Other Ambulatory Visit: Payer: Self-pay | Admitting: Internal Medicine

## 2017-05-17 DIAGNOSIS — R972 Elevated prostate specific antigen [PSA]: Secondary | ICD-10-CM

## 2017-05-21 ENCOUNTER — Other Ambulatory Visit: Payer: Self-pay | Admitting: Cardiovascular Disease

## 2017-06-21 ENCOUNTER — Other Ambulatory Visit: Payer: Self-pay | Admitting: Cardiovascular Disease

## 2017-06-21 DIAGNOSIS — E785 Hyperlipidemia, unspecified: Secondary | ICD-10-CM

## 2017-06-23 NOTE — Progress Notes (Signed)
Chief Complaint  Patient presents with  . Follow-up    CAD   History of Present Illness: 67 yo male with history of CAD s/p 5V CABG August 2011, HTN, hyperlipidemia, stage IV Hodgkins lymphoma who is here today for cardiac follow up. Cardiac cath in August 2011 showed severe triple vessel disease. He underwent 5V CABG August 2011. (LIMA to LAD, SVG to Ramus, SVG sequential to OM1/OM2, SVG to RV marginal). He was discovered to have thyroid cancer in 2014 and he underwent a thyroidectomy. Admitted to [facility] 10/02/13 with chest pain and ruled in for MI with elevated troponin. Cardiac cath on 10/03/13 with patent IMA graft to LAD, patent SVG to Ramus, occluded SVG to OM1/OM2 and occluded SVG to PDA. There were no focal targets for PCI. Imdur was added but he did not continue taking this. ABI normal March 2016.   He is here today for follow up. The patient denies any chest pain, dyspnea, palpitations, lower extremity edema, orthopnea, PND, dizziness, near syncope or syncope.   Primary Care Physician: Janith Lima, MD  Past Medical History:  Diagnosis Date  . CAD (coronary artery disease)   . H/O colonoscopy 2013  . H/O hiatal hernia   . Hemorrhoid   . History of colon polyps   . Hodgkin's lymphoma ([place])   . Hydronephrosis   . Hyperlipidemia   . Hypertension   . Hypothyroidism   . Myocardial infarction [facility]) 2011   "mild one, before the CABG"  . 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 Nocturia   . 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 Renal insufficiency   . Sleep apnea    No CPAP  . TIA (transient ischemic attack) 2011    Past Surgical History:  Procedure Laterality Date  . BONE MARROW ASPIRATE,BIOPSY, AND CLOT  06/11/11   LEFT ILIAC CREAST  . BUNIONECTOMY Right 1978   foot  . CARDIAC CATHETERIZATION  10/15/2009  . COLONOSCOPY    . CORONARY ARTERY BYPASS GRAFT  10/17/2009   LIMA to LAD,SVG to Ramus,SVG to OM sequential to OM2,  SVG to marginal of RCA  . ESOPHAGOGASTRODUODENOSCOPY    . LEFT HEART CATHETERIZATION WITH CORONARY/GRAFT ANGIOGRAM N/A 10/03/2013   Procedure: LEFT HEART CATHETERIZATION WITH Beatrix Fetters;  Surgeon: Sinclair Grooms, MD;  Location: [facility] CATH LAB;  Service: Cardiovascular;  Laterality: N/A;  . LYMPH NODE BIOPSY  06/07/11   RIGHT INGUINAL NODE: CLASSICAL HODGKIN'S LYMPHOMA, NODULAR SCLEROSIS TYPE  . PORT-A-CATH REMOVAL Left 12/06/2013   Procedure: REMOVAL PORT-A-CATH;  Surgeon: [name], MD;  Location: [facility];  Service: General;  Laterality: Left;  . PORTACATH PLACEMENT  06/10/2011   Procedure: INSERTION PORT-A-CATH;  Surgeon: [name], MD;  Location: [facility];  Service: General;  Laterality: Left;  subclavian   . THYROIDECTOMY  03/31/2012   Procedure: THYROIDECTOMY;  Surgeon: Izora Gala, MD;  Location: [facility];  Service: ENT;  Laterality: N/A;  . TONSILLECTOMY     as a child  . VASECTOMY  1980    Current Outpatient Medications  Medication Sig Dispense Refill  . aspirin EC 81 MG tablet Take 81 mg by mouth every evening.     . ezetimibe (ZETIA) 10 MG tablet FOR DIRECTIONS ON HOW TO   TAKE THIS MEDICINE, READ   THE ENCLOSED MEDICATION    INFORMATION FORM 90 tablet 0  . levothyroxine (SYNTHROID, LEVOTHROID) 137 MCG tablet Take 137  mcg by mouth daily before breakfast.    . metoprolol succinate (TOPROL-XL) 50 MG 24 hr tablet Take 1 tablet (50 mg total) by mouth daily. Please keep upcoming appt for future refills. Thank you 90 tablet 0  . nitroGLYCERIN (NITROSTAT) 0.4 MG SL tablet PLACE 1 TABLET UNDER THE TONGUE EVERY 5 MINUTES AS NEEDED FOR CHEST PAIN 25 tablet 6  . rosuvastatin (CRESTOR) 10 MG tablet Take 1 tablet by mouth 3 times a week. Please keep upcoming appt for future refills. Thank you 45 tablet 0   No current facility-administered medications for this visit.     Allergies  Allergen Reactions  . Crestor [Rosuvastatin]     Muscle aches  . Cialis  [Tadalafil]     headache    Social History   Socioeconomic History  . Marital status: Married    Spouse name: Not on file  . Number of children: 2  . Years of education: Not on file  . Highest education level: Not on file  Occupational History  . 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   Social Needs  . Financial resource strain: Not on file  . Food insecurity:    Worry: Not on file    Inability: Not on file  . Transportation needs:    Medical: Not on file    Non-medical: Not on file  Tobacco Use  . Smoking status: Never Smoker  . Smokeless tobacco: Never Used  Substance and Sexual Activity  . Alcohol use: Yes    Comment: 10/02/2013 "couple beers; couple times/month"  . Drug use: No  . Sexual activity: Never  Lifestyle  . Physical activity:    Days per week: Not on file    Minutes per session: Not on file  . Stress: Not on file  Relationships  . Social connections:    Talks on phone: Not on file    Gets together: Not on file    Attends religious service: Not on file    Active member of club or organization: Not on file    Attends meetings of clubs or organizations: Not on file    Relationship status: Not on file  . Intimate partner violence:    Fear of current or ex partner: Not on file    Emotionally abused: Not on file    Physically abused: Not on file    Forced sexual activity: Not on file  Other Topics Concern  . Not on file  Social History Narrative  . Not on file    Family History  Problem Relation Age of Onset  . Arthritis Mother   . Hypertension Mother   . Diabetes Father   . Coronary artery disease Father 36       Died age 37  . Kidney disease Father   . Hypertension Sister   . Hypertension Sister        2nd Sister  . Alcohol abuse Brother   . Prostate cancer Paternal Uncle        Great  . Cancer Neg Hx   . Drug abuse Neg Hx   . Early death Neg Hx   . Stroke Neg Hx     Review of  Systems:  As stated in the HPI and otherwise negative.   BP 140/90   Pulse 63   Ht 5\' 11"  (1.803 m)   Wt 239 lb 6.4 oz (108.6 kg)   SpO2 95%   BMI  33.39 kg/m   Physical Examination:  General: Well developed, well nourished, NAD  HEENT: OP clear, mucus membranes moist  SKIN: warm, dry. No rashes. Neuro: No focal deficits  Musculoskeletal: Muscle strength 5/5 all ext  Psychiatric: Mood and affect normal  Neck: No JVD, no carotid bruits, no thyromegaly, no lymphadenopathy.  Lungs:Clear bilaterally, no wheezes, rhonci, crackles Cardiovascular: Regular rate and rhythm. No murmurs, gallops or rubs. Abdomen:Soft. Bowel sounds present. Non-tender.  Extremities: No lower extremity edema. Pulses are 2 + in the bilateral DP/PT.  Cardiac cath 10/03/13:  The left main coronary artery is widely patent with smooth distal 30% narrowing.  The left anterior descending artery is totally occluded proximal..  The left circumflex artery is widely patent with 50% mid vessel narrowing. The first and second marginal branches are patent. The second and third marginal branches are totally occluded. The left circumflex PDA is widely patent. Retrograde filling of the sequential graft to the fourth and third marginal branch is noted.  The right coronary artery is patent but there is severe disease in the acute marginal branch which is a relatively large vessel. It is severely and diffusely involved with a proximal 80% stenosis followed by a proximal 80% stenosis in the mid to distal eccentric 80% stenosis. The continuation of the right coronary beyond the acute marginal branch is 99% obstructed.  BYPASS GRAFT ANGIOGRAPHY: SVG to the ramus intermedius branch) first obtuse marginal) is widely patent.  SVG sequential to the obtuse marginal branches is totally occluded with thrombus present beyond the side to side anastomosis with a small first branch.  SVG to acute marginal of the right coronary is totally occluded at  the aorto ostial.  LIMA to the LAD is widely patent  LEFT VENTRICULOGRAM: Left ventricular angiogram was done in the 30 RAO projection and revealed inferior hypokinesis. Overall ejection fraction is normal and estimated to be 50%.  EKG:  EKG is ordered today. The ekg ordered today demonstrates NSR, rate 63 bpm  Recent Labs: 10/29/2016: ALT 18; HGB 16.0; Platelets 175 04/25/2017: BUN 21; Creatinine, Ser 1.48; Potassium 4.4; Sodium 141 04/28/2017: TSH 0.24   Lipid Panel    Component Value Date/Time   CHOL 166 01/26/2017 1454   TRIG 77.0 01/26/2017 1454   HDL 51.60 01/26/2017 1454   CHOLHDL 3 01/26/2017 1454   VLDL 15.4 01/26/2017 1454   LDLCALC 99 01/26/2017 1454     Wt Readings from Last 3 Encounters:  06/24/17 239 lb 6.4 oz (108.6 kg)  04/25/17 243 lb (110.2 kg)  01/26/17 248 lb 1.3 oz (112.5 kg)     Other studies Reviewed: Additional studies/ records that were reviewed today include: . Review of the above records demonstrates:    Assessment and Plan:   1. CAD s/p CABG without angina: No chest pain. NO recent stress testing. Will arrange an exercise test. Will continue ASA, statin, Zetia and beta blocker.     2. HTN: BP is well controlled at home. NO changes.   3. HLD: He is tolerating low dose Crestor and Zetia. Will repeat fasting lipids and LFTs. If LDL not at goal, will need to consider Praluent.   Current medicines are reviewed at length with the patient today.  The patient does not have concerns regarding medicines.  The following changes have been made:  no change  Labs/ tests ordered today include:   Orders Placed This Encounter  Procedures  . Lipid Profile  . Hepatic function panel  . EXERCISE TOLERANCE TEST (ETT)  .  EKG 12-Lead     Disposition:   FU with me in 12 months   Signed, Lauree Chandler, MD 06/24/2017 5:04 PM    [facility] [place], [place], [place]  [address] Phone: [phone]; Fax: [phone]

## 2017-06-24 ENCOUNTER — Ambulatory Visit: Payer: 59 | Admitting: Cardiology

## 2017-06-24 ENCOUNTER — Ambulatory Visit (INDEPENDENT_AMBULATORY_CARE_PROVIDER_SITE_OTHER): Payer: 59 | Admitting: Cardiovascular Disease

## 2017-06-24 ENCOUNTER — Encounter: Payer: Self-pay | Admitting: Cardiovascular Disease

## 2017-06-24 VITALS — BP 140/90 | HR 63 | Ht 71.0 in | Wt 239.4 lb

## 2017-06-24 DIAGNOSIS — I25810 Atherosclerosis of coronary artery bypass graft(s) without angina pectoris: Secondary | ICD-10-CM | POA: Diagnosis not present

## 2017-06-24 DIAGNOSIS — E7800 Pure hypercholesterolemia, unspecified: Secondary | ICD-10-CM | POA: Diagnosis not present

## 2017-06-24 DIAGNOSIS — I10 Essential (primary) hypertension: Secondary | ICD-10-CM | POA: Diagnosis not present

## 2017-06-24 NOTE — Patient Instructions (Signed)
Medication Instructions:  Your physician recommends that you continue on your current medications as directed. Please refer to the Current Medication list given to you today.   Labwork: Your physician recommends that you return for lab work --lipid and liver profiles.  This will be fasting   Testing/Procedures: Your physician has requested that you have an exercise tolerance test. For further information please visit [email or web address]. Please also follow instruction sheet, as given.    Follow-Up: Your physician recommends that you schedule a follow-up appointment in: 12 months. Please call our office in about 9 months to schedule this appointment    Any Other Special Instructions Will Be Listed Below (If Applicable).     If you need a refill on your cardiac medications before your next appointment, please call your pharmacy.

## 2017-07-02 ENCOUNTER — Encounter: Payer: Self-pay | Admitting: Internal Medicine

## 2017-07-07 ENCOUNTER — Other Ambulatory Visit: Payer: 59 | Admitting: *Deleted

## 2017-07-07 ENCOUNTER — Ambulatory Visit (INDEPENDENT_AMBULATORY_CARE_PROVIDER_SITE_OTHER): Payer: 59

## 2017-07-07 ENCOUNTER — Other Ambulatory Visit: Payer: 59

## 2017-07-07 DIAGNOSIS — I25810 Atherosclerosis of coronary artery bypass graft(s) without angina pectoris: Secondary | ICD-10-CM

## 2017-07-07 DIAGNOSIS — R972 Elevated prostate specific antigen [PSA]: Secondary | ICD-10-CM

## 2017-07-07 DIAGNOSIS — E7800 Pure hypercholesterolemia, unspecified: Secondary | ICD-10-CM

## 2017-07-07 LAB — EXERCISE TOLERANCE TEST
CHL RATE OF PERCEIVED EXERTION: 19
CSEPED: 7 min
CSEPEW: 8.5 METS
CSEPHR: 95 %
Exercise duration (sec): 0 s
MPHR: 155 {beats}/min
Peak HR: 148 {beats}/min
Rest HR: 62 {beats}/min

## 2017-07-07 LAB — HEPATIC FUNCTION PANEL
ALBUMIN: 4.4 g/dL (ref 3.6–4.8)
ALT: 16 IU/L (ref 0–44)
AST: 21 IU/L (ref 0–40)
Alkaline Phosphatase: 57 IU/L (ref 39–117)
BILIRUBIN TOTAL: 0.3 mg/dL (ref 0.0–1.2)
BILIRUBIN, DIRECT: 0.11 mg/dL (ref 0.00–0.40)
Total Protein: 7 g/dL (ref 6.0–8.5)

## 2017-07-07 LAB — LIPID PANEL
CHOL/HDL RATIO: 3.7 ratio (ref 0.0–5.0)
CHOLESTEROL TOTAL: 173 mg/dL (ref 100–199)
HDL: 47 mg/dL (ref >39)
LDL CALC: 110 mg/dL — AB (ref 0–99)
Triglycerides: 78 mg/dL (ref 0–149)
VLDL Cholesterol Cal: 16 mg/dL (ref 5–40)

## 2017-07-08 LAB — PSA, TOTAL AND FREE
PSA, % Free: 28 % (calc) (ref >25)
PSA, Free: 0.5 ng/mL
PSA, Total: 1.8 ng/mL (ref <=4.0)

## 2017-07-09 ENCOUNTER — Encounter: Payer: Self-pay | Admitting: Internal Medicine

## 2017-07-22 LAB — HM DIABETES EYE EXAM

## 2017-08-04 NOTE — Progress Notes (Signed)
Patient ID: Devon Brady                 DOB: 12-30-1950                    MRN: [id number]     HPI: CHIPPER KOUDELKA is a 67 y.o. male patient of Dr. Angelena Form that presents today for lipid evaluation.  PMH includes CAD s/p 5V CABG August 2011, HTN, hyperlipidemia, stage IV Hodgkins lymphoma. He is currently taking rosuvastatin 10mg  daily and ezetimibe 10mg  daily.   He presents today for discussion of cholesterol and PCSK9i. He reports that he previously has been unable to tolerate higher doses of statins due to muscle symptoms. He reports that he is tolerating the rosuvastatin and ezetimibe currently.   He has several questions about oils/butter and dietary questions. Answered questions to best of ability and advised he meet with dietician as they would have much more information and be able to better advise him.   Risk Factors: CAD s/p CABG, HTN  LDL Goal: <70, nonHDL <100  Current Medications: ezetimibe 10mg  daily, rosuvastatin 10mg  three times a week  Intolerances: atorvastatin 80mg , 40mg , 20mg  daily, rosuvastatin 20mg  daily (muscle aches).   Diet: He eats out for lunch, dinner at home. Yogurt for breakfast. Eats chicken, fish, and beef. He tries to watch beef intake. He does watch how much fried foods he eats. He saute in oil. He mostly drinks water and occasional beer. He does occasionally drink diet Dr. Malachi Bonds. He also drinks coconut milk and almond milk. He does eat a lot of cheese.   Exercise: He admits to needing to do more.   Family History: mother - HTN, father - DM, CAD, CKD, sister - HTN, sister -HTN, bro - alcohol abuse  Social History: denies tobacco products, occasional alcohol  Labs: 07/07/17:  TC 173, TG 78, HDL 47, LDL 110, non HDL 126 (rosuvastatin 10mg  three times a week, ezetimibe 10mg  daily)  Past Medical History:  Diagnosis Date  . CAD (coronary artery disease)   . H/O colonoscopy 2013  . H/O hiatal hernia   . Hemorrhoid   . History of colon polyps     . Hodgkin's lymphoma ([place])   . Hydronephrosis   . Hyperlipidemia   . Hypertension   . Hypothyroidism   . Myocardial infarction [facility]) 2011   "mild one, before the CABG"  . 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 Nocturia   . 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 Renal insufficiency   . Sleep apnea    No CPAP  . TIA (transient ischemic attack) 2011    Current Outpatient Medications on File Prior to Visit  Medication Sig Dispense Refill  . aspirin EC 81 MG tablet Take 81 mg by mouth every evening.     . ezetimibe (ZETIA) 10 MG tablet FOR DIRECTIONS ON HOW TO   TAKE THIS MEDICINE, READ   THE ENCLOSED MEDICATION    INFORMATION FORM 90 tablet 0  . levothyroxine (SYNTHROID, LEVOTHROID) 137 MCG tablet Take 137 mcg by mouth daily before breakfast.    . metoprolol succinate (TOPROL-XL) 50 MG 24 hr tablet Take 1 tablet (50 mg total) by mouth daily. Please keep upcoming appt for future refills. Thank you 90 tablet 0  . nitroGLYCERIN (NITROSTAT) 0.4 MG SL tablet PLACE 1 TABLET UNDER THE TONGUE EVERY 5 MINUTES AS NEEDED FOR CHEST PAIN 25 tablet 6  .  rosuvastatin (CRESTOR) 10 MG tablet Take 1 tablet by mouth 3 times a week. Please keep upcoming appt for future refills. Thank you 45 tablet 0  . [DISCONTINUED] citalopram (CELEXA) 20 MG tablet Take 1 tablet (20 mg total) by mouth daily. 30 tablet 1  . [DISCONTINUED] omeprazole (PRILOSEC OTC) 20 MG tablet Take 1 tablet (20 mg total) by mouth daily. 28 tablet 1   No current facility-administered medications on file prior to visit.     Allergies  Allergen Reactions  . Crestor [Rosuvastatin]     Muscle aches  . Cialis [Tadalafil]     headache    Assessment/Plan: Hyperlipidemia: LDL and nonHDL are not at goal. Will continue rosuvastatin 5mg  three times week and ezetimibe 10mg  daily. Will pursue PCSK9i therapy in addition to bring LDL to goal.    Thank you,  Lelan Pons. Patterson [place], [facility]  08/04/2017 4:59 PM

## 2017-08-05 ENCOUNTER — Ambulatory Visit (INDEPENDENT_AMBULATORY_CARE_PROVIDER_SITE_OTHER): Payer: 59 | Admitting: Pharmacist

## 2017-08-05 ENCOUNTER — Telehealth: Payer: Self-pay | Admitting: Pharmacist

## 2017-08-05 ENCOUNTER — Encounter: Payer: Self-pay | Admitting: Pharmacist

## 2017-08-05 DIAGNOSIS — E785 Hyperlipidemia, unspecified: Secondary | ICD-10-CM

## 2017-08-05 LAB — HM DIABETES EYE EXAM

## 2017-08-05 MED ORDER — EVOLOCUMAB 140 MG/ML ~~LOC~~ SOAJ: 140.0000 mg | SUBCUTANEOUS | 3 refills | Status: DC

## 2017-08-05 NOTE — Patient Instructions (Signed)
We will send for coverage of PSCK9i therapy (Praluent or Repatha depending on insurance). Once approved we will send to the pharmacy.   We will plan to check your cholesterol after your 4th dose of medication.    Cholesterol Cholesterol is a fat. Your body needs a small amount of cholesterol. Cholesterol (plaque) may build up in your blood vessels (arteries). That makes you more likely to have a heart attack or stroke. You cannot feel your cholesterol level. Having a blood test is the only way to find out if your level is high. Keep your test results. Work with your doctor to keep your cholesterol at a good level. What do the results mean?  Total cholesterol is how much cholesterol is in your blood.  LDL is bad cholesterol. This is the type that can build up. Try to have low LDL.  HDL is good cholesterol. It cleans your blood vessels and carries LDL away. Try to have high HDL.  Triglycerides are fat that the body can store or burn for energy. What are good levels of cholesterol?  Total cholesterol below 200.  LDL below 100 is good for people who have health risks. LDL below 70 is good for people who have very high risks.  HDL above 40 is good. It is best to have HDL of 60 or higher.  Triglycerides below 150. How can I lower my cholesterol? Diet Follow your diet program as told by your doctor.  Choose fish, white meat chicken, or [place] that is roasted or baked. Try not to eat red meat, fried foods, sausage, or lunch meats.  Eat lots of fresh fruits and vegetables.  Choose whole grains, beans, pasta, potatoes, and cereals.  Choose olive oil, corn oil, or canola oil. Only use small amounts.  Try not to eat butter, mayonnaise, shortening, or palm kernel oils.  Try not to eat foods with trans fats.  Choose low-fat or nonfat dairy foods. ? Drink skim or nonfat milk. ? Eat low-fat or nonfat yogurt and cheeses. ? Try not to drink whole milk or cream. ? Try not to eat ice cream,  egg yolks, or full-fat cheeses.  Healthy desserts include angel food cake, ginger snaps, animal crackers, hard candy, popsicles, and low-fat or nonfat frozen yogurt. Try not to eat pastries, cakes, pies, and cookies.  Exercise Follow your exercise program as told by your doctor.  Be more active. Try gardening, walking, and taking the stairs.  Ask your doctor about ways that you can be more active.  Medicine  Take over-the-counter and prescription medicines only as told by your doctor. This information is not intended to replace advice given to you by your health care provider. Make sure you discuss any questions you have with your health care provider. Document Released: 05/28/2008 Document Revised: 10/01/2015 Document Reviewed: 09/11/2015 Elsevier Interactive Patient Education  [employer].

## 2017-08-05 NOTE — Telephone Encounter (Signed)
Pt approved for Repatha through primary insurance.   Pt would like to get medication from [facility] as that is where he gets all of his other scripts. Advised he call once receives so that we can set up labs. He states understanding.

## 2017-08-12 ENCOUNTER — Encounter: Payer: Self-pay | Admitting: Internal Medicine

## 2017-09-20 ENCOUNTER — Other Ambulatory Visit: Payer: Self-pay | Admitting: Cardiovascular Disease

## 2017-09-20 DIAGNOSIS — E785 Hyperlipidemia, unspecified: Secondary | ICD-10-CM

## 2017-09-26 ENCOUNTER — Other Ambulatory Visit: Payer: Self-pay | Admitting: Pharmacist

## 2017-09-26 ENCOUNTER — Telehealth: Payer: Self-pay | Admitting: Pharmacist

## 2017-09-26 MED ORDER — EVOLOCUMAB 140 MG/ML ~~LOC~~ SOAJ: 140.0000 mg | SUBCUTANEOUS | 3 refills | Status: DC

## 2017-09-26 MED ORDER — ROSUVASTATIN CALCIUM 10 MG PO TABS: ORAL_TABLET | ORAL | 3 refills | Status: DC

## 2017-09-26 NOTE — Progress Notes (Signed)
Rosuvastatin refills - local pharmacy

## 2017-09-26 NOTE — Telephone Encounter (Signed)
Spoke with pt, he has still not started his Repatha injections. Provided him with activated copay card information over the phone and provided him with [facility] phone number so that he can set up shipment. Advised him to call clinic with any problems.

## 2017-10-03 NOTE — Telephone Encounter (Signed)
Pt called to report that [facility] is requiring a note for why our office is prescribing Repatha.   Called [facility] - they need authorization letter faxed to their office. This has been done.

## 2017-10-26 ENCOUNTER — Telehealth: Payer: Self-pay | Admitting: Pharmacist

## 2017-10-26 DIAGNOSIS — E782 Mixed hyperlipidemia: Secondary | ICD-10-CM

## 2017-10-26 NOTE — Telephone Encounter (Signed)
Spoke with pt regarding Repatha injections. He is due for his second injection next week. No issues tolerating PCSK9i so far. Scheduled f/u labs to assess efficacy after 3rd injection.

## 2017-11-18 ENCOUNTER — Other Ambulatory Visit: Payer: Managed Care, Other (non HMO) | Admitting: *Deleted

## 2017-11-18 DIAGNOSIS — E782 Mixed hyperlipidemia: Secondary | ICD-10-CM

## 2017-11-18 LAB — LIPID PANEL
CHOLESTEROL TOTAL: 94 mg/dL — AB (ref 100–199)
Chol/HDL Ratio: 1.8 ratio (ref 0.0–5.0)
HDL: 51 mg/dL (ref >39)
LDL CALC: 32 mg/dL (ref 0–99)
Triglycerides: 56 mg/dL (ref 0–149)
VLDL CHOLESTEROL CAL: 11 mg/dL (ref 5–40)

## 2017-11-18 LAB — HEPATIC FUNCTION PANEL
ALBUMIN: 4.3 g/dL (ref 3.6–4.8)
ALT: 12 IU/L (ref 0–44)
AST: 20 IU/L (ref 0–40)
Alkaline Phosphatase: 54 IU/L (ref 39–117)
Bilirubin Total: 0.3 mg/dL (ref 0.0–1.2)
Bilirubin, Direct: 0.13 mg/dL (ref 0.00–0.40)
Total Protein: 6.8 g/dL (ref 6.0–8.5)

## 2017-11-22 ENCOUNTER — Telehealth: Payer: Self-pay | Admitting: Cardiovascular Disease

## 2017-11-22 NOTE — Telephone Encounter (Signed)
° °  Please return call with lab reults

## 2017-11-22 NOTE — Telephone Encounter (Signed)
Called patient with lab results. See below.   Notes recorded by Burnell Blanks, MD on 11/22/2017 at 8:47 AM EDT Lipids and LFTs ok. cdm

## 2017-11-25 ENCOUNTER — Other Ambulatory Visit: Payer: 59

## 2017-12-06 ENCOUNTER — Telehealth: Payer: Self-pay | Admitting: Cardiovascular Disease

## 2017-12-06 NOTE — Telephone Encounter (Signed)
Pt c/o medication issue:  1. Name of Medication: Repatha  2. How are you currently taking this medication (dosage and times per day)?  Repatha- he have taken 4 shots of Repatha  3. Are you having a reaction (difficulty breathing--STAT)? no  4. What is your medication issue? *aching in left knee-feels like it starts after he take the shot of Repatha

## 2017-12-06 NOTE — Telephone Encounter (Signed)
Returned call to pt to discuss intolerance. He reports experiencing pain in 1 knee that has worsened after 4th injection of Repatha. He reports it has been progressively getting worse (occurred for 3 days after last shot, seems to be lingering after this shot). This feels different than joint pain he experienced with statins. Will plan to have pt skip his next Repatha injection and see if symptoms improve. Will call pt in a few weeks to see how he is feeling. May be a candidate for ORION 4 study if pain improves off of Repatha.

## 2017-12-14 NOTE — Telephone Encounter (Signed)
Closing this encounter

## 2017-12-20 NOTE — Telephone Encounter (Signed)
Called pt for update off of Repatha. His last shot was 3 weeks ago and he is just starting to feel better but does still have pain in 1 knee. A doctor at his work told him they thought it may be arthritis. He sees his PCP in November and would like to wait until that appt before deciding whether or not to resume Repatha. Will call pt after this appt to discuss resuming Repatha vs referring for clinical trial.

## 2017-12-22 NOTE — Progress Notes (Signed)
Devon Brady - 67 y.o. male MRN [id number]  Date of birth: [birth year]  SUBJECTIVE:  Including CC & ROS.  Chief Complaint  Patient presents with  . Knee Injury    Devon Brady is a 67 y.o. male that is presenting with left knee pain. Pain has been ongoing for one month. Pain is located at the medial aspect. Pain is worse when he is sitting. Mild to severe during flexion. Admits to swelling and tenderness. He walks a lot during the day for his job. He his a Chief Strategy Officer. He has not been taking for the pain.     Review of Systems  Constitutional: Negative for fever.  HENT: Negative for congestion.   Respiratory: Negative for cough.   Cardiovascular: Negative for chest pain.  Gastrointestinal: Negative for abdominal pain.  Musculoskeletal: Positive for arthralgias.  Skin: Negative for color change.  Neurological: Negative for weakness.  Hematological: Negative for adenopathy.  Psychiatric/Behavioral: Negative for agitation.    HISTORY: Past Medical, Surgical, Social, and Family History Reviewed & Updated per EMR.   Pertinent Historical Findings include:  Past Medical History:  Diagnosis Date  . CAD (coronary artery disease)   . H/O colonoscopy 2013  . H/O hiatal hernia   . Hemorrhoid   . History of colon polyps   . Hodgkin's lymphoma ([place])   . Hydronephrosis   . Hyperlipidemia   . Hypertension   . Hypothyroidism   . Myocardial infarction [facility]) 2011   "mild one, before the CABG"  . 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 Nocturia   . 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 Renal insufficiency   . Sleep apnea    No CPAP  . TIA (transient ischemic attack) 2011    Past Surgical History:  Procedure Laterality Date  . BONE MARROW ASPIRATE,BIOPSY, AND CLOT  06/11/11   LEFT ILIAC CREAST  . BUNIONECTOMY Right 1978   foot  . CARDIAC CATHETERIZATION  10/15/2009  . COLONOSCOPY    . CORONARY ARTERY BYPASS  GRAFT  10/17/2009   LIMA to LAD,SVG to Ramus,SVG to OM sequential to OM2, SVG to marginal of RCA  . ESOPHAGOGASTRODUODENOSCOPY    . LEFT HEART CATHETERIZATION WITH CORONARY/GRAFT ANGIOGRAM N/A 10/03/2013   Procedure: LEFT HEART CATHETERIZATION WITH Beatrix Fetters;  Surgeon: Sinclair Grooms, MD;  Location: [facility] CATH LAB;  Service: Cardiovascular;  Laterality: N/A;  . LYMPH NODE BIOPSY  06/07/11   RIGHT INGUINAL NODE: CLASSICAL HODGKIN'S LYMPHOMA, NODULAR SCLEROSIS TYPE  . PORT-A-CATH REMOVAL Left 12/06/2013   Procedure: REMOVAL PORT-A-CATH;  Surgeon: [name], MD;  Location: [place];  Service: General;  Laterality: Left;  . PORTACATH PLACEMENT  06/10/2011   Procedure: INSERTION PORT-A-CATH;  Surgeon: [name], MD;  Location: [facility];  Service: General;  Laterality: Left;  subclavian   . THYROIDECTOMY  03/31/2012   Procedure: THYROIDECTOMY;  Surgeon: Izora Gala, MD;  Location: [place];  Service: ENT;  Laterality: N/A;  . TONSILLECTOMY     as a child  . VASECTOMY  1980    Allergies  Allergen Reactions  . Crestor [Rosuvastatin]     Muscle aches  . Cialis [Tadalafil]     headache    Family History  Problem Relation Age of Onset  . Arthritis Mother   . Hypertension Mother   . Diabetes Father   . Coronary artery disease Father 31       Died  age 62  . Kidney disease Father   . Hypertension Sister   . Hypertension Sister        2nd Sister  . Alcohol abuse Brother   . Prostate cancer Paternal Uncle        Great  . Cancer Neg Hx   . Drug abuse Neg Hx   . Early death Neg Hx   . Stroke Neg Hx      Social History   Socioeconomic History  . Marital status: Married    Spouse name: Not on file  . Number of children: 2  . Years of education: Not on file  . Highest education level: Not on file  Occupational History  . 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   Social Needs  . Financial resource  strain: Not on file  . Food insecurity:    Worry: Not on file    Inability: Not on file  . Transportation needs:    Medical: Not on file    Non-medical: Not on file  Tobacco Use  . Smoking status: Never Smoker  . Smokeless tobacco: Never Used  Substance and Sexual Activity  . Alcohol use: Yes    Comment: 10/02/2013 "couple beers; couple times/month"  . Drug use: No  . Sexual activity: Never  Lifestyle  . Physical activity:    Days per week: Not on file    Minutes per session: Not on file  . Stress: Not on file  Relationships  . Social connections:    Talks on phone: Not on file    Gets together: Not on file    Attends religious service: Not on file    Active member of club or organization: Not on file    Attends meetings of clubs or organizations: Not on file    Relationship status: Not on file  . Intimate partner violence:    Fear of current or ex partner: Not on file    Emotionally abused: Not on file    Physically abused: Not on file    Forced sexual activity: Not on file  Other Topics Concern  . Not on file  Social History Narrative  . Not on file     PHYSICAL EXAM:  VS: BP 134/76   Pulse 66   Ht 5\' 11"  (1.803 m)   Wt 259 lb (117.5 kg)   SpO2 100%   BMI 36.12 kg/m  Physical Exam Gen: NAD, alert, cooperative with exam, well-appearing ENT: normal lips, normal nasal mucosa,  Eye: normal EOM, normal conjunctiva and lids CV:  no edema, +2 pedal pulses   Resp: no accessory muscle use, non-labored,   Skin: no rashes, no areas of induration  Neuro: normal tone, normal sensation to touch Psych:  normal insight, alert and oriented MSK:  Left Knee: Normal to inspection with no erythema  Mild effusion observed  Palpation normal with no warmth, TTp of the medial joint line  ROM full in flexion and extension and lower leg rotation. Mild instability with valgus testing  Negative Mcmurray's tests. Non painful patellar compression. Patellar glide without  crepitus. Patellar and quadriceps tendons unremarkable. Hamstring and quadriceps strength is normal.  Neurovascularly intact    Aspiration/Injection Procedure Note Devon Brady 14-Aug-1950  Procedure: Injection Indications: left knee pain   Procedure Details Consent: Risks of procedure as well as the alternatives and risks of each were explained to the (patient/caregiver).  Consent for procedure obtained. Time  Out: Verified patient identification, verified procedure, site/side was marked, verified correct patient position, special equipment/implants available, medications/allergies/relevent history reviewed, required imaging and test results available.  Performed.  The area was cleaned with iodine and alcohol swabs.    The left knee superior lateral suprapatellar pouch was injected using 1 cc's of 40 mg Depomedrol and 4 cc's of 1% lidocaine with a 25 1 1/2" needle.  Ultrasound was used   A sterile dressing was applied.  Patient did tolerate procedure well.        ASSESSMENT & PLAN:   Acute pain of left knee Pain appears to be associated with degenerative changes of the medial joint line.   -Injection today. -Counseled on supportive care. -Counseled on home exercise therapy. -X-ray -If no improvement can consider medial unloader versus physical therapy.

## 2017-12-23 ENCOUNTER — Encounter: Payer: Self-pay | Admitting: Family Medicine

## 2017-12-23 ENCOUNTER — Other Ambulatory Visit: Payer: Managed Care, Other (non HMO)

## 2017-12-23 ENCOUNTER — Ambulatory Visit (INDEPENDENT_AMBULATORY_CARE_PROVIDER_SITE_OTHER)
Admission: RE | Admit: 2017-12-23 | Discharge: 2017-12-23 | Disposition: A | Discharge status: Home / Self Care | Payer: Managed Care, Other (non HMO) | Source: Ambulatory Visit | Attending: Family Medicine | Admitting: Family Medicine

## 2017-12-23 ENCOUNTER — Ambulatory Visit (INDEPENDENT_AMBULATORY_CARE_PROVIDER_SITE_OTHER): Payer: Managed Care, Other (non HMO) | Admitting: Family Medicine

## 2017-12-23 VITALS — BP 134/76 | HR 66 | Ht 71.0 in | Wt 259.0 lb

## 2017-12-23 DIAGNOSIS — M25562 Pain in left knee: Secondary | ICD-10-CM

## 2017-12-23 NOTE — Patient Instructions (Addendum)
Nice to meet you  Take tylenol 650 mg three times a day is the best evidence based medicine we have for arthritis.  Glucosamine sulfate 750mg  twice a day is a supplement that has been shown to help moderate to severe arthritis. Vitamin D 2000 IU daily Fish oil 2 grams daily.  Tumeric 500mg  twice daily.  Capsaicin topically up to four times a day may also help with pain. Cortisone injections are an option if these interventions do not seem to make a difference or need more relief.  Please let me know if your pain doesn't improve.

## 2017-12-25 DIAGNOSIS — M25562 Pain in left knee: Secondary | ICD-10-CM | POA: Insufficient documentation

## 2017-12-25 NOTE — Assessment & Plan Note (Signed)
Pain appears to be associated with degenerative changes of the medial joint line.   -Injection today. -Counseled on supportive care. -Counseled on home exercise therapy. -X-ray -If no improvement can consider medial unloader versus physical therapy.

## 2017-12-26 ENCOUNTER — Telehealth: Payer: Self-pay | Admitting: Family Medicine

## 2017-12-26 NOTE — Telephone Encounter (Signed)
Left VM for patient. If he calls back please have him speak with a nurse/CMA and inform that his xray shows degenerative changes on the inside of his knee like we saw on the ultrasound. The [facility] can report results to patient.   If any questions then please take the best time and phone number to call and I will try to call him back.   Rosemarie Ax, MD [facility] 12/26/2017, 10:05 AM

## 2017-12-26 NOTE — Telephone Encounter (Signed)
Pt. Given xray results. Pt. Asking if he could fitted for the brace he and Dr. Raeford Razor discussed. States his knee is still hurting.Please advise pt.

## 2017-12-27 NOTE — Telephone Encounter (Signed)
Pt checking status on hearing back from Dr. Raeford Razor or his nurse. He states that he can barley walk and would like to speak to someone as soon as possible.

## 2017-12-27 NOTE — Telephone Encounter (Addendum)
Spoke with patient he would like to try a medial unloader brace. Wanted to wait to follow up, would like to try the brace first to see if it improved his pain.

## 2017-12-28 NOTE — Telephone Encounter (Signed)
Left VM for patient about the custom medial unloader. He should be getting a call from the rep.   Rosemarie Ax, MD [facility] 12/28/2017, 8:23 AM

## 2018-01-03 NOTE — Telephone Encounter (Signed)
Left VM for patient. If he calls back please have him speak with a nurse/CMA and inform that the rep got back from a business trip so he should be getting a call about the brace. The [facility] can report results to patient.   If any questions then please take the best time and phone number to call and I will try to call him back.   Rosemarie Ax, MD [facility] 01/03/2018, 11:08 AM

## 2018-01-03 NOTE — Telephone Encounter (Signed)
Pt calling to f/u on knee brace - CRM from 10/18 pt checking on it was completed but pt states he did not get a phone call - he has not been contacted by the company and requesting a status update.  Call back [phone]

## 2018-01-06 ENCOUNTER — Encounter (INDEPENDENT_AMBULATORY_CARE_PROVIDER_SITE_OTHER): Payer: Managed Care, Other (non HMO) | Admitting: Ophthalmology

## 2018-01-06 DIAGNOSIS — H353111 Nonexudative age-related macular degeneration, right eye, early dry stage: Secondary | ICD-10-CM

## 2018-01-06 DIAGNOSIS — H35033 Hypertensive retinopathy, bilateral: Secondary | ICD-10-CM | POA: Diagnosis not present

## 2018-01-06 DIAGNOSIS — H35372 Puckering of macula, left eye: Secondary | ICD-10-CM

## 2018-01-06 DIAGNOSIS — I10 Essential (primary) hypertension: Secondary | ICD-10-CM

## 2018-01-06 DIAGNOSIS — H2511 Age-related nuclear cataract, right eye: Secondary | ICD-10-CM

## 2018-01-06 DIAGNOSIS — H43813 Vitreous degeneration, bilateral: Secondary | ICD-10-CM

## 2018-01-24 ENCOUNTER — Other Ambulatory Visit: Payer: Self-pay

## 2018-01-25 MED ORDER — METOPROLOL SUCCINATE ER 50 MG PO TB24: 50.0000 mg | ORAL_TABLET | Freq: Every day | ORAL | 1 refills | Status: DC

## 2018-01-30 ENCOUNTER — Ambulatory Visit (INDEPENDENT_AMBULATORY_CARE_PROVIDER_SITE_OTHER): Payer: Managed Care, Other (non HMO) | Admitting: Internal Medicine

## 2018-01-30 ENCOUNTER — Other Ambulatory Visit (INDEPENDENT_AMBULATORY_CARE_PROVIDER_SITE_OTHER): Payer: Managed Care, Other (non HMO)

## 2018-01-30 ENCOUNTER — Encounter: Payer: Self-pay | Admitting: Internal Medicine

## 2018-01-30 VITALS — BP 124/90 | HR 82 | Temp 97.9°F | Ht 71.0 in | Wt 243.8 lb

## 2018-01-30 DIAGNOSIS — Z8571 Personal history of Hodgkin lymphoma: Secondary | ICD-10-CM | POA: Diagnosis not present

## 2018-01-30 DIAGNOSIS — Z0000 Encounter for general adult medical examination without abnormal findings: Secondary | ICD-10-CM | POA: Diagnosis not present

## 2018-01-30 DIAGNOSIS — H900 Conductive hearing loss, bilateral: Secondary | ICD-10-CM

## 2018-01-30 DIAGNOSIS — E890 Postprocedural hypothyroidism: Secondary | ICD-10-CM

## 2018-01-30 DIAGNOSIS — N400 Enlarged prostate without lower urinary tract symptoms: Secondary | ICD-10-CM

## 2018-01-30 DIAGNOSIS — E118 Type 2 diabetes mellitus with unspecified complications: Secondary | ICD-10-CM

## 2018-01-30 DIAGNOSIS — E1129 Type 2 diabetes mellitus with other diabetic kidney complication: Secondary | ICD-10-CM

## 2018-01-30 DIAGNOSIS — N289 Disorder of kidney and ureter, unspecified: Secondary | ICD-10-CM

## 2018-01-30 DIAGNOSIS — I10 Essential (primary) hypertension: Secondary | ICD-10-CM

## 2018-01-30 DIAGNOSIS — R809 Proteinuria, unspecified: Secondary | ICD-10-CM

## 2018-01-30 LAB — URINALYSIS, ROUTINE W REFLEX MICROSCOPIC
Bilirubin Urine: NEGATIVE
KETONES UR: NEGATIVE
Leukocytes, UA: NEGATIVE
Nitrite: NEGATIVE
RBC / HPF: NONE SEEN (ref 0–?)
SPECIFIC GRAVITY, URINE: 1.025 (ref 1.000–1.030)
Total Protein, Urine: 30 — AB
URINE GLUCOSE: NEGATIVE
Urobilinogen, UA: 0.2 (ref 0.0–1.0)
pH: 5.5 (ref 5.0–8.0)

## 2018-01-30 LAB — PSA: PSA: 2.32 ng/mL (ref 0.10–4.00)

## 2018-01-30 LAB — COMPREHENSIVE METABOLIC PANEL
ALK PHOS: 46 U/L (ref 39–117)
ALT: 13 U/L (ref 0–53)
AST: 18 U/L (ref 0–37)
Albumin: 4.7 g/dL (ref 3.5–5.2)
BUN: 29 mg/dL — ABNORMAL HIGH (ref 6–23)
CO2: 24 mEq/L (ref 19–32)
Calcium: 10.2 mg/dL (ref 8.4–10.5)
Chloride: 107 mEq/L (ref 96–112)
Creatinine, Ser: 1.62 mg/dL — ABNORMAL HIGH (ref 0.40–1.50)
GFR: 55.05 mL/min — AB (ref >60.00)
Glucose, Bld: 88 mg/dL (ref 70–99)
POTASSIUM: 4.3 meq/L (ref 3.5–5.1)
SODIUM: 142 meq/L (ref 135–145)
TOTAL PROTEIN: 7.7 g/dL (ref 6.0–8.3)
Total Bilirubin: 0.6 mg/dL (ref 0.2–1.2)

## 2018-01-30 LAB — CBC WITH DIFFERENTIAL/PLATELET
BASOS ABS: 0 10*3/uL (ref 0.0–0.1)
Basophils Relative: 0.6 % (ref 0.0–3.0)
EOS PCT: 1.2 % (ref 0.0–5.0)
Eosinophils Absolute: 0.1 10*3/uL (ref 0.0–0.7)
HCT: 50.6 % (ref 39.0–52.0)
Hemoglobin: 16.8 g/dL (ref 13.0–17.0)
Lymphocytes Relative: 29 % (ref 12.0–46.0)
Lymphs Abs: 2.5 10*3/uL (ref 0.7–4.0)
MCHC: 33.2 g/dL (ref 30.0–36.0)
MCV: 88.8 fl (ref 78.0–100.0)
MONO ABS: 0.6 10*3/uL (ref 0.1–1.0)
MONOS PCT: 6.5 % (ref 3.0–12.0)
NEUTROS ABS: 5.4 10*3/uL (ref 1.4–7.7)
NEUTROS PCT: 62.7 % (ref 43.0–77.0)
PLATELETS: 223 10*3/uL (ref 150.0–400.0)
RBC: 5.69 Mil/uL (ref 4.22–5.81)
RDW: 15.1 % (ref 11.5–15.5)
WBC: 8.6 10*3/uL (ref 4.0–10.5)

## 2018-01-30 LAB — TSH: TSH: 2.7 u[IU]/mL (ref 0.35–4.50)

## 2018-01-30 LAB — HEMOGLOBIN A1C: Hgb A1c MFr Bld: 6.5 % (ref 4.6–6.5)

## 2018-01-30 LAB — MICROALBUMIN / CREATININE URINE RATIO
CREATININE, U: 171.6 mg/dL
MICROALB UR: 12.5 mg/dL — AB (ref 0.0–1.9)
MICROALB/CREAT RATIO: 7.3 mg/g (ref 0.0–30.0)

## 2018-01-30 NOTE — Patient Instructions (Signed)

## 2018-01-30 NOTE — Progress Notes (Signed)
Subjective:  Patient ID: Devon Brady, male    DOB: 05-28-1950  Age: 67 y.o. MRN: [id number]  CC: Annual Exam; Hypothyroidism; and Diabetes   HPI [name] presents for a CPX.  He has intentionally lost weight since I last saw him.  He is very active and denies any recent episodes of CP, DOE, palpitations, edema, or fatigue.  He complains of arthralgias in his knees and recently saw an orthopedic surgeon.  Past Medical History:  Diagnosis Date  . CAD (coronary artery disease)   . H/O colonoscopy 2013  . H/O hiatal hernia   . Hemorrhoid   . History of colon polyps   . Hodgkin's lymphoma ([place])   . Hydronephrosis   . Hyperlipidemia   . Hypertension   . Hypothyroidism   . Myocardial infarction [facility]) 2011   "mild one, before the CABG"  . 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 Nocturia   . 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 Renal insufficiency   . Sleep apnea    No CPAP  . TIA (transient ischemic attack) 2011   Past Surgical History:  Procedure Laterality Date  . BONE MARROW ASPIRATE,BIOPSY, AND CLOT  06/11/11   LEFT ILIAC CREAST  . BUNIONECTOMY Right 1978   foot  . CARDIAC CATHETERIZATION  10/15/2009  . COLONOSCOPY    . CORONARY ARTERY BYPASS GRAFT  10/17/2009   LIMA to LAD,SVG to Ramus,SVG to OM sequential to OM2, SVG to marginal of RCA  . ESOPHAGOGASTRODUODENOSCOPY    . LEFT HEART CATHETERIZATION WITH CORONARY/GRAFT ANGIOGRAM N/A 10/03/2013   Procedure: LEFT HEART CATHETERIZATION WITH Beatrix Fetters;  Surgeon: Sinclair Grooms, MD;  Location[facility] C[facility] LAB;  Service: Cardiovascular;  Laterality: N/A;  . LYMPH NODE BIOPSY  06/07/11   RIGHT INGUINAL NODE: CLASSICAL HODGKIN'S LYMPHOMA, NODULAR SCLEROSIS TYPE  . PORT-A-CATH REMOVAL Left 12/06/2013   Procedure: REMOVAL PORT-A-CATH;  Surgeon: [name], MD;  Location: [place];  Service: General;  Laterality: Left;  . PORTACATH  PLACEMENT  06/10/2011   Procedure: INSERTION PORT-A-CATH;  Surgeon: [name], MD;  Location: [facility];  Service: General;  Laterality: Left;  subclavian   . THYROIDECTOMY  03/31/2012   Procedure: THYROIDECTOMY;  Surgeon: Izora Gala, MD;  Location: [place];  Service: ENT;  Laterality: N/A;  . TONSILLECTOMY     as a child  . VASECTOMY  1980    reports that he has never smoked. He has never used smokeless tobacco. He reports that he drinks alcohol. He reports that he does not use drugs. family history includes Alcohol abuse in his brother; Arthritis in his mother; Coronary artery disease (age of onset: 64) in his father; Diabetes in his father; Hypertension in his mother, sister, and sister; Kidney disease in his father; Prostate cancer in his paternal uncle. Allergies  Allergen Reactions  . Crestor [Rosuvastatin]     Muscle aches  . Cialis [Tadalafil]     headache    Outpatient Medications Prior to Visit  Medication Sig Dispense Refill  . aspirin EC 81 MG tablet Take 81 mg by mouth every evening.     . Evolocumab (REPATHA SURECLICK) 976 MG/ML SOAJ Inject 140 mg into the skin every 14 (fourteen) days. 6 pen 3  . ezetimibe (ZETIA) 10 MG tablet FOR DIRECTIONS ON HOW TO   TAKE THIS MEDICINE, READ   THE ENCLOSED MEDICATION    INFORMATION FORM 90  tablet 3  . metoprolol succinate (TOPROL-XL) 50 MG 24 hr tablet Take 1 tablet (50 mg total) by mouth daily. 90 tablet 1  . nitroGLYCERIN (NITROSTAT) 0.4 MG SL tablet PLACE 1 TABLET UNDER THE TONGUE EVERY 5 MINUTES AS NEEDED FOR CHEST PAIN 25 tablet 6  . rosuvastatin (CRESTOR) 10 MG tablet Take 1 tablet by mouth 3 times a week as tolerated. 45 tablet 3  . levothyroxine (SYNTHROID, LEVOTHROID) 137 MCG tablet Take 137 mcg by mouth daily before breakfast.     No facility-administered medications prior to visit.     ROS Review of Systems  Constitutional: Negative.  Negative for appetite change, diaphoresis and fatigue.  HENT: Positive for hearing loss.  Negative for trouble swallowing.   Eyes: Negative for visual disturbance.  Respiratory: Negative.  Negative for cough, chest tightness, shortness of breath and wheezing.   Cardiovascular: Negative for chest pain, palpitations and leg swelling.  Gastrointestinal: Negative for abdominal pain, constipation, diarrhea, nausea and vomiting.  Endocrine: Negative.  Negative for polydipsia, polyphagia and polyuria.  Genitourinary: Negative.  Negative for difficulty urinating, dysuria, frequency, hematuria, penile swelling, scrotal swelling, testicular pain and urgency.  Musculoskeletal: Positive for arthralgias. Negative for back pain and myalgias.  Skin: Negative.  Negative for color change and rash.  Allergic/Immunologic: Negative.   Neurological: Negative.  Negative for dizziness, weakness and light-headedness.  Hematological: Negative for adenopathy. Does not bruise/bleed easily.  Psychiatric/Behavioral: Negative.     Objective:  BP 124/90 (BP Location: Left Arm, Patient Position: Sitting, Cuff Size: Large)   Pulse 82   Temp 97.9 F (36.6 C) (Oral)   Ht 5\' 11"  (1.803 m)   Wt 243 lb 12 oz (110.6 kg)   SpO2 96%   BMI 34.00 kg/m   BP Readings from Last 3 Encounters:  01/30/18 124/90  12/23/17 134/76  06/24/17 140/90    Wt Readings from Last 3 Encounters:  01/30/18 243 lb 12 oz (110.6 kg)  12/23/17 259 lb (117.5 kg)  06/24/17 239 lb 6.4 oz (108.6 kg)    Physical Exam  Constitutional: He is oriented to person, place, and time. No distress.  HENT:  Right Ear: Hearing, tympanic membrane, external ear and ear canal normal.  Left Ear: Hearing, tympanic membrane, external ear and ear canal normal.  Mouth/Throat: Oropharynx is clear and moist. No oropharyngeal exudate.  Eyes: Conjunctivae are normal. No scleral icterus.  Neck: Normal range of motion. Neck supple. No JVD present. No thyromegaly present.  Cardiovascular: Normal rate, regular rhythm and normal heart sounds. Exam reveals no  gallop.  No murmur heard. Pulmonary/Chest: Effort normal and breath sounds normal. No respiratory distress. He has no wheezes. He has no rales.  Abdominal: Soft. Normal appearance and bowel sounds are normal. He exhibits no mass. There is no hepatosplenomegaly. There is no tenderness. Hernia confirmed negative in the right inguinal area and confirmed negative in the left inguinal area.  Genitourinary: Testes normal and penis normal. Rectal exam shows external hemorrhoid and internal hemorrhoid. Rectal exam shows no fissure, no mass, no tenderness, anal tone normal and guaiac negative stool. Prostate is enlarged (1+ smooth symm BPH). Prostate is not tender. Right testis shows no mass, no swelling and no tenderness. Left testis shows no mass, no swelling and no tenderness. Circumcised. No penile erythema or penile tenderness. No discharge found.  Musculoskeletal: Normal range of motion. He exhibits no edema, tenderness or deformity.  Lymphadenopathy:    He has no cervical adenopathy. No inguinal adenopathy noted on the right  or left side.  Neurological: He is alert and oriented to person, place, and time.  Skin: Skin is warm and dry. No rash noted. He is not diaphoretic.  Psychiatric: He has a normal mood and affect. His behavior is normal. Judgment and thought content normal.  Vitals reviewed.   Lab Results  Component Value Date   WBC 8.6 01/30/2018   HGB 16.8 01/30/2018   HCT 50.6 01/30/2018   PLT 223.0 01/30/2018   GLUCOSE 88 01/30/2018   CHOL 94 (L) 11/18/2017   TRIG 56 11/18/2017   HDL 51 11/18/2017   LDLCALC 32 11/18/2017   ALT 13 01/30/2018   AST 18 01/30/2018   NA 142 01/30/2018   K 4.3 01/30/2018   CL 107 01/30/2018   CREATININE 1.62 (H) 01/30/2018   BUN 29 (H) 01/30/2018   CO2 24 01/30/2018   TSH 2.70 01/30/2018   PSA 2.32 01/30/2018   INR 0.97 10/02/2013   HGBA1C 6.5 01/30/2018   MICROALBUR 12.5 (H) 01/30/2018    Dg Knee Complete 4 Views Left  Result Date:  12/23/2017 CLINICAL DATA:  Chronic pain. No known injury. Hx of arthritis. EXAM: LEFT KNEE - COMPLETE 4+ VIEW COMPARISON:  None. FINDINGS: There is minimal degenerative change involving the patellofemoral compartment. Small joint effusion is present. Vascular clips are identified along the posterior aspect of the knee. IMPRESSION: 1. Small joint effusion. 2. Mild degenerative changes. Electronically Signed   By: Nolon Nations M.D.   On: 12/23/2017 14:57    Assessment & Plan:   Mahad was seen today for annual exam, hypothyroidism and diabetes.  Diagnoses and all orders for this visit:  History of Hodgkin's lymphoma- There is no evidence of recurrence. -     CBC with Differential/Platelet; Future  Renal insufficiency -     Comprehensive metabolic panel; Future -     Urinalysis, Routine w reflex microscopic; Future  Hypothyroidism, postsurgical- His TSH is in the normal range.  Will continue the current dose of levothyroxine. -     TSH; Future  Type II diabetes mellitus with manifestations ([place])- His A1c is at 6.5%.  His blood sugars are adequately well controlled.  He has mild renal insufficiency and proteinuria.  I have asked him to add Invokana for CV and renal risk reductions. -     Hemoglobin A1c; Future -     Microalbumin / creatinine urine ratio; Future -     canagliflozin (INVOKANA) 100 MG TABS tablet; Take 1 tablet (100 mg total) by mouth daily before breakfast.  Routine general medical examination at a health care facility  Conductive hearing loss, bilateral -     Ambulatory referral to Audiology  Benign prostatic hyperplasia without lower urinary tract symptoms- His PSA is normal which is reassuring that he does not have prostate cancer.  He has no symptoms that need to be treated. -     PSA; Future  Essential hypertension- He has not achieved his blood pressure goal of less than 130/80.  I have asked him to add an ARB. -     Azilsartan Medoxomil (EDARBI) 40 MG TABS;  Take 1 tablet by mouth daily.  Microalbuminuria due to type 2 diabetes mellitus ([place])- Will start an ARB to mitigate the progression of the microalbuminuria. -     Azilsartan Medoxomil (EDARBI) 40 MG TABS; Take 1 tablet by mouth daily.   I have changed Cletus Gash B. Bennion's levothyroxine. I am also having him start on Azilsartan Medoxomil and canagliflozin. Additionally, I am  having him maintain his aspirin EC, nitroGLYCERIN, ezetimibe, Evolocumab, rosuvastatin, and metoprolol succinate.  Meds ordered this encounter  Medications  . Azilsartan Medoxomil (EDARBI) 40 MG TABS    Sig: Take 1 tablet by mouth daily.    Dispense:  90 tablet    Refill:  1  . canagliflozin (INVOKANA) 100 MG TABS tablet    Sig: Take 1 tablet (100 mg total) by mouth daily before breakfast.    Dispense:  90 tablet    Refill:  1  . levothyroxine (SYNTHROID, LEVOTHROID) 137 MCG tablet    Sig: Take 1 tablet (137 mcg total) by mouth daily before breakfast.    Dispense:  90 tablet    Refill:  1   See AVS for instructions about healthy living and anticipatory guidance.  Follow-up: Return in about 6 months (around 07/31/2018).  Scarlette Calico, MD

## 2018-01-31 ENCOUNTER — Encounter: Payer: Self-pay | Admitting: Internal Medicine

## 2018-01-31 ENCOUNTER — Other Ambulatory Visit: Payer: Self-pay | Admitting: Internal Medicine

## 2018-01-31 DIAGNOSIS — I10 Essential (primary) hypertension: Secondary | ICD-10-CM | POA: Insufficient documentation

## 2018-01-31 DIAGNOSIS — E1129 Type 2 diabetes mellitus with other diabetic kidney complication: Secondary | ICD-10-CM | POA: Insufficient documentation

## 2018-01-31 DIAGNOSIS — E118 Type 2 diabetes mellitus with unspecified complications: Secondary | ICD-10-CM

## 2018-01-31 DIAGNOSIS — R809 Proteinuria, unspecified: Secondary | ICD-10-CM

## 2018-01-31 MED ORDER — CANAGLIFLOZIN 100 MG PO TABS: 100.0000 mg | ORAL_TABLET | Freq: Every day | ORAL | 1 refills | Status: DC

## 2018-01-31 MED ORDER — DAPAGLIFLOZIN PROPANEDIOL 10 MG PO TABS: 10.0000 mg | ORAL_TABLET | Freq: Every day | ORAL | 1 refills | Status: DC

## 2018-01-31 MED ORDER — AZILSARTAN MEDOXOMIL 40 MG PO TABS: 1.0000 | ORAL_TABLET | Freq: Every day | ORAL | 1 refills | Status: DC

## 2018-01-31 MED ORDER — LEVOTHYROXINE SODIUM 137 MCG PO TABS: 137.0000 ug | ORAL_TABLET | Freq: Every day | ORAL | 1 refills | Status: DC

## 2018-01-31 NOTE — Assessment & Plan Note (Signed)

## 2018-02-06 NOTE — Telephone Encounter (Signed)
Called pt for update - he states he restarted his Repatha and has had 2 shots so far. Reports tolerating therapy well, issue was a torn meniscus in his knee. He is being fitted for a brace soon. He has already had labs on Repatha checked with excellent LDL reduction from 110 to 32. He will continue current therapy.

## 2018-03-17 DIAGNOSIS — S83249A Other tear of medial meniscus, current injury, unspecified knee, initial encounter: Secondary | ICD-10-CM | POA: Insufficient documentation

## 2018-06-23 ENCOUNTER — Telehealth: Payer: Self-pay | Admitting: *Deleted

## 2018-06-23 NOTE — Telephone Encounter (Signed)
SPOKE WITH PT AND PT CONSENTED TO MYCHART VIDEO

## 2018-06-26 ENCOUNTER — Telehealth: Payer: Self-pay | Admitting: Cardiovascular Disease

## 2018-06-26 NOTE — Telephone Encounter (Signed)
New Message   Patient states he needs an excuse for work to self quarantine due to [place].  He doesn't have the virus he just needs to stay home to protect himself from getting it.  Please give him a call.

## 2018-06-26 NOTE — Telephone Encounter (Signed)
The patient is requesting a work note because of the Corona Virus and his cardiac condition.  I reminded him that he has a Hospital doctor Visit with [place] later this week and it can be discussed.

## 2018-06-27 ENCOUNTER — Encounter: Payer: Self-pay | Admitting: Cardiovascular Disease

## 2018-06-27 NOTE — Telephone Encounter (Signed)
Pt aware letter has been written.  He will make sure he can access through [place].

## 2018-06-27 NOTE — Telephone Encounter (Signed)
Letter written in patient's chart. Can we check and see how he would like to receive this? Thanks, chris

## 2018-06-29 NOTE — Progress Notes (Signed)
Virtual Visit via Video Note   This visit type was conducted due to national recommendations for restrictions regarding the COVID-19 Pandemic (e.g. social distancing) in an effort to limit this patient's exposure and mitigate transmission in our community.  Due to his co-morbid illnesses, this patient is at least at moderate risk for complications without adequate follow up.  This format is felt to be most appropriate for this patient at this time.  All issues noted in this document were discussed and addressed.  A limited physical exam was performed with this format.  Please refer to the patient's chart for his consent to telehealth for [facility].   Evaluation Performed:  Follow-up visit  Date:  06/30/2018   ID:  Devon Brady, DOB 1950/10/23, MRN [id number]  Patient Location: Home Provider Location: Home  PCP:  Janith Lima, MD  Cardiologist:  Lauree Chandler, MD   Electrophysiologist:  None   Chief Complaint:  FU on CAD  History of Present Illness:    Devon Brady is a 68 y.o. male with coronary artery disease s/p CABG in 10/2009, hypertension, hyperlipidemia, CKD, stage 4 Hodgkins' Lymphoma, , thyroid CA s/p thyroidectomy.  He suffered a NSTEMI in 7/15.  Cardiac Catheterization demonstrated patent L-LAD, S-RI and an occluded S-OM/1OM2 and occluded S-PDA. There were no targets for PCI and he was treated medically.  He was last seen by Dr. Angelena Form in 06/2017.    Today, he notes he is doing well.  He has not had chest pain, shortness of breath, syncope, paroxysmal nocturnal dyspnea, leg swelling.  He did stop the [place] due to frequent urination.  This was started by his PCP due to uncontrolled blood pressure.    The patient does not have symptoms concerning for COVID-19 infection (fever, chills, cough, or new shortness of breath).    Past Medical History:  Diagnosis Date  . CAD (coronary artery disease)   . H/O colonoscopy 2013  . H/O hiatal hernia   .  Hemorrhoid   . History of colon polyps   . Hodgkin's lymphoma ([place])   . Hydronephrosis   . Hyperlipidemia   . Hypertension   . Hypothyroidism   . Myocardial infarction [facility]) 2011   "mild one, before the CABG"  . 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 Nocturia   . 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 Renal insufficiency   . Sleep apnea    No CPAP  . TIA (transient ischemic attack) 2011   Past Surgical History:  Procedure Laterality Date  . BONE MARROW ASPIRATE,BIOPSY, AND CLOT  06/11/11   LEFT ILIAC CREAST  . BUNIONECTOMY Right 1978   foot  . CARDIAC CATHETERIZATION  10/15/2009  . COLONOSCOPY    . CORONARY ARTERY BYPASS GRAFT  10/17/2009   LIMA to LAD,SVG to Ramus,SVG to OM sequential to OM2, SVG to marginal of RCA  . ESOPHAGOGASTRODUODENOSCOPY    . LEFT HEART CATHETERIZATION WITH CORONARY/GRAFT ANGIOGRAM N/A 10/03/2013   Procedure: LEFT HEART CATHETERIZATION WITH Beatrix Fetters;  Surgeon: Sinclair Grooms, MD;  Location: [facility] CATH LAB;  Service: Cardiovascular;  Laterality: N/A;  . LYMPH NODE BIOPSY  06/07/11   RIGHT INGUINAL NODE: CLASSICAL HODGKIN'S LYMPHOMA, NODULAR SCLEROSIS TYPE  . PORT-A-CATH REMOVAL Left 12/06/2013   Procedure: REMOVAL PORT-A-CATH;  Surgeon: [name], MD;  Location: [place];  Service: General;  Laterality: Left;  . PORTACATH PLACEMENT  06/10/2011  Procedure: INSERTION PORT-A-CATH;  Surgeon: [name], MD;  [facility];  Service: General;  Laterality: Left;  subclavian   . THYROIDECTOMY  03/31/2012   Procedure: THYROIDECTOMY;  Surgeon: Izora Gala, MD;  Location: [place];  Service: ENT;  Laterality: N/A;  . TONSILLECTOMY     as a child  . VASECTOMY  1980     Current Meds  Medication Sig  . aspirin EC 81 MG tablet Take 81 mg by mouth every evening.   . dapagliflozin propanediol (FARXIGA) 10 MG TABS tablet Take 10 mg by mouth daily.  . Evolocumab (REPATHA SURECLICK)  388 MG/ML SOAJ Inject 140 mg into the skin every 14 (fourteen) days.  Marland Kitchen ezetimibe (ZETIA) 10 MG tablet Take 10 mg by mouth daily.  Marland Kitchen levothyroxine (SYNTHROID, LEVOTHROID) 137 MCG tablet Take 1 tablet (137 mcg total) by mouth daily before breakfast.  . metoprolol succinate (TOPROL-XL) 50 MG 24 hr tablet Take 1 tablet (50 mg total) by mouth daily.  . nitroGLYCERIN (NITROSTAT) 0.4 MG SL tablet PLACE 1 TABLET UNDER THE TONGUE EVERY 5 MINUTES AS NEEDED FOR CHEST PAIN  . rosuvastatin (CRESTOR) 10 MG tablet Take 1 tablet by mouth 3 times a week as tolerated.  . [DISCONTINUED] Azilsartan Medoxomil (EDARBI) 40 MG TABS Take 1 tablet by mouth daily.  . [DISCONTINUED] ezetimibe (ZETIA) 10 MG tablet FOR DIRECTIONS ON HOW TO   TAKE THIS MEDICINE, READ   THE ENCLOSED MEDICATION    INFORMATION FORM (Patient taking differently: Take 10 mg by mouth daily. )     Allergies:   Crestor [rosuvastatin] and Cialis [tadalafil]   Social History   Tobacco Use  . Smoking status: Never Smoker  . Smokeless tobacco: Never Used  Substance Use Topics  . Alcohol use: Yes    Comment: 10/02/2013 "couple beers; couple times/month"  . Drug use: No     Family Hx: The patient's family history includes Alcohol abuse in his brother; Arthritis in his mother; Coronary artery disease (age of onset: 62) in his father; Diabetes in his father; Hypertension in his mother, sister, and sister; Kidney disease in his father; Prostate cancer in his paternal uncle. There is no history of Cancer, Drug abuse, Early death, or Stroke.  ROS:   Please see the history of present illness.    All other systems reviewed and are negative.   Prior CV studies:   The following studies were reviewed today:  ETT 06/2017 Negative stress test without evidence of ischemia at given workload.  Cardiac Catheterization 10/03/13 LM dist 30 LAD prox 100 LCx mid 34; OM2 and OM3 100 RCA patent; AM prox 80, mid-dist 80, dist 99 S-RI patent L-LAD patent  S-OM  100 S-AM 100 EF 50, inf HK Med Rx  Echo 06/11/11 Mild LVH, EF 60-65, Gr 2 DD  Labs/Other Tests and Data Reviewed:    EKG:  No ECG reviewed.  Recent Labs: 01/30/2018: ALT 13; BUN 29; Creatinine, Ser 1.62; Hemoglobin 16.8; Platelets 223.0; Potassium 4.3; Sodium 142; TSH 2.70   Recent Lipid Panel Lab Results  Component Value Date/Time   CHOL 94 (L) 11/18/2017 08:30 AM   TRIG 56 11/18/2017 08:30 AM   HDL 51 11/18/2017 08:30 AM   CHOLHDL 1.8 11/18/2017 08:30 AM   CHOLHDL 3 01/26/2017 02:54 PM   LDLCALC 32 11/18/2017 08:30 AM    Wt Readings from Last 3 Encounters:  06/30/18 241 lb (109.3 kg)  01/30/18 243 lb 12 oz (110.6 kg)  12/23/17 259 lb (117.5 kg)  Objective:    Vital Signs:  BP (!) 159/82   Pulse 61   Ht 5' 10.5" (1.791 m)   Wt 241 lb (109.3 kg)   BMI 34.09 kg/m    VITAL SIGNS:  reviewed GEN:  no acute distress RESPIRATORY:  normal respiratory effort, symmetric expansion NEURO:  alert and oriented x 3, no obvious focal deficit PSYCH:  normal affect  ASSESSMENT & PLAN:    Coronary artery disease involving native coronary artery of native heart without angina pectoris History of CABG in 2011.  Exercise treadmill test in 2019 was low risk.  He is doing well without anginal symptoms.  Continue aspirin, rosuvastatin, evolocumab.  Essential hypertension Blood pressure is uncontrolled.  He was unable to tolerate Azilsartan due to frequent urination.  He stopped this couple of weeks ago.  Continue current dose of metoprolol succinate.  Start amlodipine 5 mg daily.  I have asked him to obtain blood pressure readings over the next couple of weeks and send those to me through [place].  Hyperlipidemia with target LDL less than 100 LDL optimal on most recent lab work.  Continue current Rx.    Type II diabetes mellitus with manifestations [facility]) He had some questions today regarding his diagnosis of diabetes.  He also mentions a medication that was prescribed by primary  care that he has not filled.  I think he may be referring to dapagliflozin.  I have asked him to contact his primary care physician and discuss this further.  Educated About Covid-19 Virus Infection The signs and symptoms of COVID-19 were discussed with the patient and how to seek care for testing (follow up with PCP or arrange E-visit).  The importance of social distancing was discussed today.  Time:   Today, I have spent 25 minutes with the patient with telehealth technology discussing the above problems.     Medication Adjustments/Labs and Tests Ordered: Current medicines are reviewed at length with the patient today.  Concerns regarding medicines are outlined above.   Tests Ordered: No orders of the defined types were placed in this encounter.   Medication Changes: Meds ordered this encounter  Medications  . amLODipine (NORVASC) 5 MG tablet    Sig: Take 1 tablet (5 mg total) by mouth daily.    Dispense:  90 tablet    Refill:  3    Order Specific Question:   Supervising Provider    Answer:   Lelon Perla [[id number]]  . Evolocumab (REPATHA SURECLICK) 332 MG/ML SOAJ    Sig: Inject 140 mg into the skin every 14 (fourteen) days.    Dispense:  6 pen    Refill:  3    ICD10 code is E78.5 - [facility] [place] is primary insurance - [place] is secondary. Please call and verify copay with patient before shipping.    Order Specific Question:   Supervising Provider    Answer:   Lelon Perla [[id number]]    Disposition:  Follow up in 1 year(s)  Signed, Vanzile Dopp, PA-C  06/30/2018 8:52 AM    [place]

## 2018-06-30 ENCOUNTER — Telehealth (INDEPENDENT_AMBULATORY_CARE_PROVIDER_SITE_OTHER): Payer: Managed Care, Other (non HMO) | Admitting: Physician Assistant

## 2018-06-30 ENCOUNTER — Encounter: Payer: Self-pay | Admitting: Physician Assistant

## 2018-06-30 VITALS — BP 159/82 | HR 61 | Ht 70.5 in | Wt 241.0 lb

## 2018-06-30 DIAGNOSIS — E785 Hyperlipidemia, unspecified: Secondary | ICD-10-CM | POA: Diagnosis not present

## 2018-06-30 DIAGNOSIS — Z7189 Other specified counseling: Secondary | ICD-10-CM

## 2018-06-30 DIAGNOSIS — E118 Type 2 diabetes mellitus with unspecified complications: Secondary | ICD-10-CM

## 2018-06-30 DIAGNOSIS — I2510 Atherosclerotic heart disease of native coronary artery without angina pectoris: Secondary | ICD-10-CM

## 2018-06-30 DIAGNOSIS — I10 Essential (primary) hypertension: Secondary | ICD-10-CM | POA: Diagnosis not present

## 2018-06-30 MED ORDER — AMLODIPINE BESYLATE 5 MG PO TABS: 5.0000 mg | ORAL_TABLET | Freq: Every day | ORAL | 3 refills | Status: DC

## 2018-06-30 MED ORDER — EVOLOCUMAB 140 MG/ML ~~LOC~~ SOAJ: 140.0000 mg | SUBCUTANEOUS | 3 refills | Status: DC

## 2018-06-30 NOTE — Patient Instructions (Signed)
Medication Instructions:  Start Amlodipine (Norvasc) 5 mg once daily for blood pressure   Do NOT take the Azilsartan anymore.  If you need a refill on your cardiac medications before your next appointment, please call your pharmacy.   Lab work: None   If you have labs (blood work) drawn today and your tests are completely normal, you will receive your results only by: Marland Kitchen MyChart Message (if you have MyChart) OR . A paper copy in the mail If you have any lab test that is abnormal or we need to change your treatment, we will call you to review the results.  Testing/Procedures: None   Follow-Up: At [facility], you and your health needs are our priority.  As part of our continuing mission to provide you with exceptional heart care, we have created designated Provider Care Teams.  These Care Teams include your primary Cardiologist (physician) and Advanced Practice Providers (APPs -  Physician Assistants and Nurse Practitioners) who all work together to provide you with the care you need, when you need it. You will need a follow up appointment in 12 months.  Please call our office 2 months in advance to schedule this appointment.  You may see Lauree Chandler, MD or one of the following Advanced Practice Providers on your designated Care Team:   [place], PA-C Melina Copa, PA-C . Ermalinda Barrios, PA-C  Any Other Special Instructions Will Be Listed Below (If Applicable).  Check your blood pressure 1-2 times a day and send me those readings after 2 weeks. You can send it through [place].  Call Dr. Ronnald Ramp and talk to him about the other medication you did not start and discuss diabetes.

## 2018-07-23 ENCOUNTER — Other Ambulatory Visit: Payer: Self-pay | Admitting: Cardiovascular Disease

## 2018-07-29 ENCOUNTER — Other Ambulatory Visit: Payer: Self-pay | Admitting: Internal Medicine

## 2018-07-29 DIAGNOSIS — E118 Type 2 diabetes mellitus with unspecified complications: Secondary | ICD-10-CM

## 2018-08-21 ENCOUNTER — Other Ambulatory Visit: Payer: Self-pay | Admitting: Cardiovascular Disease

## 2018-09-27 ENCOUNTER — Telehealth: Payer: Self-pay | Admitting: Pharmacist

## 2018-09-27 MED ORDER — PRALUENT 75 MG/ML ~~LOC~~ SOAJ: 1.0000 "pen " | SUBCUTANEOUS | 11 refills | Status: DC

## 2018-09-27 NOTE — Telephone Encounter (Addendum)
Left message with wife for patient to return our call. Patient was on [place]. 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 PA for Praluent approved through 09/27/2019. [facility] Praluent 75mg  [facility]   Copay card has been sent to pharmacy

## 2018-09-27 NOTE — Telephone Encounter (Signed)
Spoke with patient and provided him the information below

## 2018-10-04 LAB — HM DIABETES EYE EXAM

## 2018-10-07 ENCOUNTER — Other Ambulatory Visit: Payer: Self-pay | Admitting: Cardiovascular Disease

## 2018-10-29 ENCOUNTER — Other Ambulatory Visit: Payer: Self-pay | Admitting: Internal Medicine

## 2018-10-29 DIAGNOSIS — E118 Type 2 diabetes mellitus with unspecified complications: Secondary | ICD-10-CM

## 2018-11-13 ENCOUNTER — Telehealth: Payer: Self-pay | Admitting: Cardiovascular Disease

## 2018-11-13 NOTE — Telephone Encounter (Signed)
I spoke with the patient.  He states that he stopped his crestor/ zetia/ & praluent ~ 1 month ago as he felt "I was just on too much medicine. All I could taste was medicine."  He denies any other issues with these meds. He wanted to try to see how he could do being off of these for a month with diet/ weight loss. He has lost 19 lbs since 10/16/18.  He had lipids checked at work in Jan 2020: Total cholesterol: 106 Triglycerides- 62 HDL- 60 LDL- 34  He just had his lipids checked again at work on 11/10/18 after being off a lipid therapy x 1 month: Total cholesterol: 241 Triglycerides- 83 HDL- 60 LDL- 162  The patient states he was quite discouraged by his #'s and knows he will need to go back on something.  I advised the patient to resume his crestor and zetia at the doses he was taking. He has praluent at home to take, but has not opened it. I advised I will review with Dr. Angelena Form to see how he feels about that since his LDL in January was 34.  I asked have his labs faxed to [place] from January & August so we can scan in his chart.  He states the nurse was to have faxed these this morning.  The patient voices understanding and is agreeable.

## 2018-11-13 NOTE — Telephone Encounter (Signed)
° ° °  Pt c/o medication issue:  1. Name of Medication: patient did not know the name  2. How are you currently taking this medication (dosage and times per day)? n/a  3. Are you having a reaction (difficulty breathing--STAT)? no 4. What is your medication issue?  Patient took himself  off cholesterol medications 1 month ago.  Patient would like to know if how to resume taking medication.

## 2018-11-13 NOTE — Telephone Encounter (Signed)
I would like him to resume his medications. Thanks, Gerald Stabs

## 2018-11-15 NOTE — Telephone Encounter (Signed)
Follow up ° ° °Patient is returning your call. Please call. ° ° ° °

## 2018-11-15 NOTE — Telephone Encounter (Signed)
Left message to call office

## 2018-11-16 NOTE — Telephone Encounter (Signed)
Left message to call back  

## 2018-11-22 NOTE — Telephone Encounter (Signed)
Reviewed Dr Camillia Herter recommendations with patient.  He verbalized understanding.

## 2018-11-22 NOTE — Telephone Encounter (Signed)
° ° °  Please return call to patient °

## 2018-12-26 ENCOUNTER — Other Ambulatory Visit: Payer: Self-pay | Admitting: Cardiovascular Disease

## 2019-01-09 LAB — TSH: TSH: 0.16 — AB (ref <=5.90)

## 2019-01-12 ENCOUNTER — Encounter (INDEPENDENT_AMBULATORY_CARE_PROVIDER_SITE_OTHER): Payer: Managed Care, Other (non HMO) | Admitting: Ophthalmology

## 2019-01-12 DIAGNOSIS — H35372 Puckering of macula, left eye: Secondary | ICD-10-CM

## 2019-01-12 DIAGNOSIS — H43813 Vitreous degeneration, bilateral: Secondary | ICD-10-CM

## 2019-01-12 DIAGNOSIS — H353111 Nonexudative age-related macular degeneration, right eye, early dry stage: Secondary | ICD-10-CM

## 2019-01-12 DIAGNOSIS — I10 Essential (primary) hypertension: Secondary | ICD-10-CM | POA: Diagnosis not present

## 2019-01-12 DIAGNOSIS — H35033 Hypertensive retinopathy, bilateral: Secondary | ICD-10-CM | POA: Diagnosis not present

## 2019-02-01 ENCOUNTER — Encounter: Payer: Self-pay | Admitting: Internal Medicine

## 2019-02-01 ENCOUNTER — Ambulatory Visit (INDEPENDENT_AMBULATORY_CARE_PROVIDER_SITE_OTHER): Payer: Managed Care, Other (non HMO) | Admitting: Internal Medicine

## 2019-02-01 ENCOUNTER — Other Ambulatory Visit: Payer: Self-pay

## 2019-02-01 ENCOUNTER — Other Ambulatory Visit (INDEPENDENT_AMBULATORY_CARE_PROVIDER_SITE_OTHER): Payer: Managed Care, Other (non HMO)

## 2019-02-01 VITALS — BP 130/80 | HR 59 | Temp 97.8°F | Ht 70.5 in | Wt 242.0 lb

## 2019-02-01 DIAGNOSIS — N400 Enlarged prostate without lower urinary tract symptoms: Secondary | ICD-10-CM

## 2019-02-01 DIAGNOSIS — R809 Proteinuria, unspecified: Secondary | ICD-10-CM

## 2019-02-01 DIAGNOSIS — E118 Type 2 diabetes mellitus with unspecified complications: Secondary | ICD-10-CM

## 2019-02-01 DIAGNOSIS — N289 Disorder of kidney and ureter, unspecified: Secondary | ICD-10-CM | POA: Diagnosis not present

## 2019-02-01 DIAGNOSIS — Z0000 Encounter for general adult medical examination without abnormal findings: Secondary | ICD-10-CM | POA: Diagnosis not present

## 2019-02-01 DIAGNOSIS — I10 Essential (primary) hypertension: Secondary | ICD-10-CM

## 2019-02-01 DIAGNOSIS — E1129 Type 2 diabetes mellitus with other diabetic kidney complication: Secondary | ICD-10-CM | POA: Diagnosis not present

## 2019-02-01 DIAGNOSIS — N1831 Chronic kidney disease, stage 3a: Secondary | ICD-10-CM

## 2019-02-01 DIAGNOSIS — E890 Postprocedural hypothyroidism: Secondary | ICD-10-CM

## 2019-02-01 DIAGNOSIS — E785 Hyperlipidemia, unspecified: Secondary | ICD-10-CM

## 2019-02-01 LAB — MICROALBUMIN / CREATININE URINE RATIO
Creatinine,U: 150.8 mg/dL
Microalb Creat Ratio: 4.3 mg/g (ref 0.0–30.0)
Microalb, Ur: 6.4 mg/dL — ABNORMAL HIGH (ref 0.0–1.9)

## 2019-02-01 LAB — URINALYSIS, ROUTINE W REFLEX MICROSCOPIC
Bilirubin Urine: NEGATIVE
Hgb urine dipstick: NEGATIVE
Ketones, ur: NEGATIVE
Leukocytes,Ua: NEGATIVE
Nitrite: NEGATIVE
RBC / HPF: NONE SEEN (ref 0–?)
Specific Gravity, Urine: 1.025 (ref 1.000–1.030)
Urine Glucose: NEGATIVE
Urobilinogen, UA: 0.2 (ref 0.0–1.0)
pH: 6 (ref 5.0–8.0)

## 2019-02-01 LAB — HEMOGLOBIN A1C: Hgb A1c MFr Bld: 6.2 % (ref 4.6–6.5)

## 2019-02-01 LAB — HEPATIC FUNCTION PANEL
ALT: 9 U/L (ref 0–53)
AST: 15 U/L (ref 0–37)
Albumin: 4.4 g/dL (ref 3.5–5.2)
Alkaline Phosphatase: 64 U/L (ref 39–117)
Bilirubin, Direct: 0.1 mg/dL (ref 0.0–0.3)
Total Bilirubin: 0.6 mg/dL (ref 0.2–1.2)
Total Protein: 6.9 g/dL (ref 6.0–8.3)

## 2019-02-01 LAB — CBC WITH DIFFERENTIAL/PLATELET
Basophils Absolute: 0 10*3/uL (ref 0.0–0.1)
Basophils Relative: 0.7 % (ref 0.0–3.0)
Eosinophils Absolute: 0.2 10*3/uL (ref 0.0–0.7)
Eosinophils Relative: 2.7 % (ref 0.0–5.0)
HCT: 47.5 % (ref 39.0–52.0)
Hemoglobin: 15.8 g/dL (ref 13.0–17.0)
Lymphocytes Relative: 29.1 % (ref 12.0–46.0)
Lymphs Abs: 1.6 10*3/uL (ref 0.7–4.0)
MCHC: 33.3 g/dL (ref 30.0–36.0)
MCV: 88.6 fl (ref 78.0–100.0)
Monocytes Absolute: 0.4 10*3/uL (ref 0.1–1.0)
Monocytes Relative: 7.8 % (ref 3.0–12.0)
Neutro Abs: 3.4 10*3/uL (ref 1.4–7.7)
Neutrophils Relative %: 59.7 % (ref 43.0–77.0)
Platelets: 194 10*3/uL (ref 150.0–400.0)
RBC: 5.36 Mil/uL (ref 4.22–5.81)
RDW: 14.7 % (ref 11.5–15.5)
WBC: 5.7 10*3/uL (ref 4.0–10.5)

## 2019-02-01 LAB — BASIC METABOLIC PANEL
BUN: 23 mg/dL (ref 6–23)
CO2: 30 mEq/L (ref 19–32)
Calcium: 9.7 mg/dL (ref 8.4–10.5)
Chloride: 105 mEq/L (ref 96–112)
Creatinine, Ser: 1.68 mg/dL — ABNORMAL HIGH (ref 0.40–1.50)
GFR: 49.52 mL/min — ABNORMAL LOW (ref >60.00)
Glucose, Bld: 98 mg/dL (ref 70–99)
Potassium: 4.5 mEq/L (ref 3.5–5.1)
Sodium: 143 mEq/L (ref 135–145)

## 2019-02-01 LAB — LIPID PANEL
Cholesterol: 106 mg/dL (ref 0–200)
HDL: 47.4 mg/dL (ref >39.00)
LDL Cholesterol: 46 mg/dL (ref 0–99)
NonHDL: 58.3
Total CHOL/HDL Ratio: 2
Triglycerides: 62 mg/dL (ref 0.0–149.0)
VLDL: 12.4 mg/dL (ref 0.0–40.0)

## 2019-02-01 LAB — PSA: PSA: 1.69 ng/mL (ref 0.10–4.00)

## 2019-02-01 NOTE — Patient Instructions (Signed)

## 2019-02-01 NOTE — Progress Notes (Signed)
Subjective:  Patient ID: Devon Brady, male    DOB: 02-03-1951  Age: 68 y.o. MRN: [id number]  CC: Annual Exam, Hypertension, Diabetes, and Hyperlipidemia  This visit occurred during the SARS-CoV-2 public health emergency.  Safety protocols were in place, including screening questions prior to the visit, additional usage of staff PPE, and extensive cleaning of exam room while observing appropriate contact time as indicated for disinfecting solutions.    HPI Devon Brady presents for a CPX.  He feels well today, offers no complaints.  He is active and denies any recent episodes of CP, DOE, palpitations, edema, or fatigue.  He recently saw his endocrinologist and his TSH was suppressed so he has lowered his levothyroxine dose to 125 mcg a day.  He has not recently been taking [place].  He says he ran out several months ago and decided not to continue taking it.  Outpatient Medications Prior to Visit  Medication Sig Dispense Refill  . amLODipine (NORVASC) 5 MG tablet Take 1 tablet (5 mg total) by mouth daily. 90 tablet 3  . aspirin EC 81 MG tablet Take 81 mg by mouth every evening.     . ezetimibe (ZETIA) 10 MG tablet TAKE 1 TABLET DAILY (PLEASE CALL OUR OFFICE TO SCHEDULE A YEARLY APPOINTMENT WITH DR. Angelena Form FOR APRIL BEFORE ANYMORE REFILLS. [phone]) 90 tablet 1  . levothyroxine (SYNTHROID) 125 MCG tablet Take 125 mcg by mouth every morning.    . metoprolol succinate (TOPROL-XL) 50 MG 24 hr tablet TAKE 1 TABLET BY MOUTH EVERY DAY 90 tablet 3  . nitroGLYCERIN (NITROSTAT) 0.4 MG SL tablet PLACE 1 TABLET UNDER THE TONGUE EVERY 5 MINUTES AS NEEDED FOR CHEST PAIN 25 tablet 6  . PRALUENT 75 MG/ML SOAJ INJECT 1 PEN INTO THE SKIN EVERY 14 DAYS 2 pen 11  . rosuvastatin (CRESTOR) 10 MG tablet Take 1 tablet by mouth 3 times a week as tolerated. Please make annual appt with Dr. Angelena Form for future refills. Thank you 45 tablet 0  . FARXIGA 10 MG TABS tablet TAKE 1 TABLET BY MOUTH EVERY  DAY 90 tablet 0  . levothyroxine (SYNTHROID, LEVOTHROID) 137 MCG tablet Take 1 tablet (137 mcg total) by mouth daily before breakfast. 90 tablet 1   No facility-administered medications prior to visit.     ROS Review of Systems  Constitutional: Negative.  Negative for chills, diaphoresis, fatigue and fever.  HENT: Negative.   Respiratory: Negative for cough, chest tightness, shortness of breath and wheezing.   Cardiovascular: Negative for chest pain, palpitations and leg swelling.  Gastrointestinal: Negative for abdominal pain, constipation, diarrhea, nausea and vomiting.  Endocrine: Negative.  Negative for cold intolerance and heat intolerance.  Genitourinary: Negative.  Negative for difficulty urinating, dysuria, scrotal swelling, testicular pain and urgency.  Musculoskeletal: Negative for arthralgias and myalgias.  Skin: Negative.  Negative for color change, pallor and rash.  Neurological: Negative.  Negative for dizziness, weakness, light-headedness and headaches.  Hematological: Negative for adenopathy. Does not bruise/bleed easily.  Psychiatric/Behavioral: Negative.     Objective:  BP 130/80 (BP Location: Left Arm, Patient Position: Sitting, Cuff Size: Large)   Pulse (!) 59   Temp 97.8 F (36.6 C) (Oral)   Ht 5' 10.5" (1.791 m)   Wt 242 lb (109.8 kg)   SpO2 99%   BMI 34.23 kg/m   BP Readings from Last 3 Encounters:  02/01/19 130/80  06/30/18 (!) 159/82  01/30/18 124/90    Wt Readings from Last 3 Encounters:  02/01/19  242 lb (109.8 kg)  06/30/18 241 lb (109.3 kg)  01/30/18 243 lb 12 oz (110.6 kg)    Physical Exam Vitals signs reviewed.  Constitutional:      Appearance: Normal appearance. He is obese.  HENT:     Nose: Nose normal.     Mouth/Throat:     Mouth: Mucous membranes are moist.     Pharynx: Oropharynx is clear.  Eyes:     General: No scleral icterus.    Conjunctiva/sclera: Conjunctivae normal.  Neck:     Musculoskeletal: Neck supple.   Cardiovascular:     Rate and Rhythm: Regular rhythm. Bradycardia present.     Heart sounds: No murmur.     Comments: EKG ----  Sinus  Bradycardia  WITHIN NORMAL LIMITS   Pulmonary:     Effort: Pulmonary effort is normal.     Breath sounds: No stridor. No wheezing, rhonchi or rales.  Abdominal:     General: Abdomen is protuberant. Bowel sounds are normal. There is no distension.     Palpations: Abdomen is soft. There is no hepatomegaly or splenomegaly.     Tenderness: There is no abdominal tenderness.     Hernia: No hernia is present.  Genitourinary:    Pubic Area: No rash.      Penis: Normal and circumcised. No discharge, swelling or lesions.      Scrotum/Testes: Normal.        Right: Mass or swelling not present.        Left: Mass or tenderness not present.     Epididymis:     Right: Normal. Not inflamed or enlarged.     Left: Not inflamed or enlarged.     Prostate: Enlarged (1+ smooth symm BPH). Not tender.     Rectum: Normal. Guaiac result negative. No tenderness, anal fissure, external hemorrhoid or internal hemorrhoid. Normal anal tone.  Musculoskeletal: Normal range of motion.     Right lower leg: No edema.     Left lower leg: No edema.  Lymphadenopathy:     Cervical: No cervical adenopathy.  Skin:    General: Skin is warm and dry.     Coloration: Skin is not pale.  Neurological:     General: No focal deficit present.     Mental Status: He is alert and oriented to person, place, and time. Mental status is at baseline.  Psychiatric:        Mood and Affect: Mood normal.        Behavior: Behavior normal.     Lab Results  Component Value Date   WBC 5.7 02/01/2019   HGB 15.8 02/01/2019   HCT 47.5 02/01/2019   PLT 194.0 02/01/2019   GLUCOSE 98 02/01/2019   CHOL 106 02/01/2019   TRIG 62.0 02/01/2019   HDL 47.40 02/01/2019   LDLCALC 46 02/01/2019   ALT 9 02/01/2019   AST 15 02/01/2019   NA 143 02/01/2019   K 4.5 02/01/2019   CL 105 02/01/2019   CREATININE  1.68 (H) 02/01/2019   BUN 23 02/01/2019   CO2 30 02/01/2019   TSH 0.16 (A) 01/09/2019   PSA 1.69 02/01/2019   INR 0.97 10/02/2013   HGBA1C 6.2 02/01/2019   MICROALBUR 6.4 (H) 02/01/2019    Dg Knee Complete 4 Views Left  Result Date: 12/23/2017 CLINICAL DATA:  Chronic pain. No known injury. Hx of arthritis. EXAM: LEFT KNEE - COMPLETE 4+ VIEW COMPARISON:  None. FINDINGS: There is minimal degenerative change involving the patellofemoral compartment. Small  joint effusion is present. Vascular clips are identified along the posterior aspect of the knee. IMPRESSION: 1. Small joint effusion. 2. Mild degenerative changes. Electronically Signed   By: Nolon Nations M.D.   On: 12/23/2017 14:57    Assessment & Plan:   Astor was seen today for annual exam, hypertension, diabetes and hyperlipidemia.  Diagnoses and all orders for this visit:  Essential hypertension- His blood pressure is adequately well controlled. -     CBC with Differential; Future -     Basic metabolic panel; Future -     Urinalysis, Routine w reflex microscopic; Future -     EKG 12-Lead  Type II diabetes mellitus with manifestations ([place])- His A1c is at 6.4%.  He has achieved adequate glycemic control.  Medical therapy is not indicated. -     Basic metabolic panel; Future -     Hemoglobin A1c; Future -     Microalbumin / creatinine urine ratio; Future -     HM Diabetes Foot Exam  Microalbuminuria due to type 2 diabetes mellitus ([place])- Will continue to maintain tight control of his blood pressure and blood sugar.  Renal insufficiency -     CBC with Differential; Future -     Basic metabolic panel; Future -     Urinalysis, Routine w reflex microscopic; Future -     Microalbumin / creatinine urine ratio; Future  Hyperlipidemia with target LDL less than 100- He has achieved his LDL goal and is doing well on the statin. -     Lipid panel; Future -     Hepatic function panel; Future  Routine general medical  examination at a health care facility-  Exam completed, labs reviewed, vaccines reviewed and updated, colon cancer screening is up-to-date, patient education was given.  Hypothyroidism, postsurgical -     Cancel: TSH; Future  Benign prostatic hyperplasia without lower urinary tract symptoms- His PSA is low which is reassuring that he does not have prostate cancer.  He has no symptoms that need to be treated. -     PSA; Future  Stage 3a chronic kidney disease- He will avoid nephrotoxic agents.  Will continue to maintain tight control of his blood pressure and blood sugars.   I have discontinued Tavari B. Lacko's [place]. I am also having him maintain his aspirin EC, nitroGLYCERIN, amLODipine, metoprolol succinate, rosuvastatin, ezetimibe, Praluent, and levothyroxine.  No orders of the defined types were placed in this encounter.    Follow-up: Return in about 6 months (around 08/01/2019).  Scarlette Calico, MD

## 2019-04-23 ENCOUNTER — Other Ambulatory Visit: Payer: Self-pay | Admitting: Cardiovascular Disease

## 2019-05-23 ENCOUNTER — Telehealth: Payer: Self-pay | Admitting: Cardiovascular Disease

## 2019-05-23 ENCOUNTER — Other Ambulatory Visit: Payer: Self-pay

## 2019-05-23 MED ORDER — EZETIMIBE 10 MG PO TABS: ORAL_TABLET | ORAL | 0 refills | Status: DC

## 2019-05-23 NOTE — Telephone Encounter (Signed)
  *  STAT* If patient is at the pharmacy, call can be transferred to refill team.   1. Which medications need to be refilled? (please list name of each medication and dose if known) ezetimibe (ZETIA) 10 MG tablet  2. Which pharmacy/location (including street and city if local pharmacy) is medication to be sent to? [id number] [place], [place] - [address]  3. Do they need a 30 day or 90 day supply? 90 days

## 2019-07-05 ENCOUNTER — Other Ambulatory Visit: Payer: Self-pay | Admitting: Physician Assistant

## 2019-07-16 ENCOUNTER — Encounter: Payer: Self-pay | Admitting: Cardiovascular Disease

## 2019-07-16 ENCOUNTER — Ambulatory Visit (INDEPENDENT_AMBULATORY_CARE_PROVIDER_SITE_OTHER): Payer: Managed Care, Other (non HMO) | Admitting: Cardiovascular Disease

## 2019-07-16 ENCOUNTER — Other Ambulatory Visit: Payer: Self-pay | Admitting: Cardiovascular Disease

## 2019-07-16 ENCOUNTER — Other Ambulatory Visit: Payer: Self-pay

## 2019-07-16 VITALS — BP 114/72 | HR 64 | Ht 70.5 in | Wt 235.8 lb

## 2019-07-16 DIAGNOSIS — I2510 Atherosclerotic heart disease of native coronary artery without angina pectoris: Secondary | ICD-10-CM | POA: Diagnosis not present

## 2019-07-16 DIAGNOSIS — E782 Mixed hyperlipidemia: Secondary | ICD-10-CM | POA: Diagnosis not present

## 2019-07-16 DIAGNOSIS — I10 Essential (primary) hypertension: Secondary | ICD-10-CM | POA: Diagnosis not present

## 2019-07-16 NOTE — Patient Instructions (Signed)
Medication Instructions:  No changes *If you need a refill on your cardiac medications before your next appointment, please call your pharmacy*  Lab Work: none If you have labs (blood work) drawn today and your tests are completely normal, you will receive your results only by: . MyChart Message (if you have MyChart) OR . A paper copy in the mail If you have any lab test that is abnormal or we need to change your treatment, we will call you to review the results.  Testing/Procedures: none  Follow-Up: At [facility], you and your health needs are our priority.  As part of our continuing mission to provide you with exceptional heart care, we have created designated Provider Care Teams.  These Care Teams include your primary Cardiologist (physician) and Advanced Practice Providers (APPs -  Physician Assistants and Nurse Practitioners) who all work together to provide you with the care you need, when you need it.  Your next appointment:   12 month(s)  The format for your next appointment:   Either In Person or Virtual  Provider:   Christopher McAlhany, MD  Other Instructions   

## 2019-07-16 NOTE — Progress Notes (Signed)
Chief Complaint  Patient presents with  . Follow-up    CAD   History of Present Illness: 69 yo male with history of CAD s/p 5V CABG August 2011, HTN, hyperlipidemia, stage IV Hodgkins lymphoma who is here today for cardiac follow up. Cardiac cath in August 2011 showed severe triple vessel disease. He underwent 5V CABG August 2011. (LIMA to LAD, SVG to Ramus, SVG sequential to OM1/OM2, SVG to RV marginal). He was discovered to have thyroid cancer in 2014 and he underwent a thyroidectomy. Admitted to [facility] 10/02/13 with chest pain and ruled in for MI with elevated troponin. Cardiac cath on 10/03/13 with patent IMA graft to LAD, patent SVG to Ramus, occluded SVG to OM1/OM2 and occluded SVG to PDA. There were no focal targets for PCI. Imdur was added but he did not continue taking this. ABI normal March 2016. Exercise stress test in April 2019 with no ischemia. He was started on Praluent in May 2019.   He is here today for follow up. The patient denies any chest pain, dyspnea, palpitations, lower extremity edema, orthopnea, PND, dizziness, near syncope or syncope.   Primary Care Physician: Janith Lima, MD  Past Medical History:  Diagnosis Date  . CAD (coronary artery disease)   . H/O colonoscopy 2013  . H/O hiatal hernia   . Hemorrhoid   . History of colon polyps   . Hodgkin's lymphoma ([place])   . Hydronephrosis   . Hyperlipidemia   . Hypertension   . Hypothyroidism   . Myocardial infarction [facility]) 2011   "mild one, before the CABG"  . 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 Nocturia   . 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 Renal insufficiency   . Sleep apnea    No CPAP  . TIA (transient ischemic attack) 2011    Past Surgical History:  Procedure Laterality Date  . BONE MARROW ASPIRATE,BIOPSY, AND CLOT  06/11/11   LEFT ILIAC CREAST  . BUNIONECTOMY Right 1978   foot  . CARDIAC CATHETERIZATION  10/15/2009  . COLONOSCOPY    .  CORONARY ARTERY BYPASS GRAFT  10/17/2009   LIMA to LAD,SVG to Ramus,SVG to OM sequential to OM2, SVG to marginal of RCA  . ESOPHAGOGASTRODUODENOSCOPY    . LEFT HEART CATHETERIZATION WITH CORONARY/GRAFT ANGIOGRAM N/A 10/03/2013   Procedure: LEFT HEART CATHETERIZATION WITH Beatrix Fetters;  Surgeon: Sinclair Grooms, MD;  Location: [facility] CATH LAB;  Service: Cardiovascular;  Laterality: N/A;  . LYMPH NODE BIOPSY  06/07/11   RIGHT INGUINAL NODE: CLASSICAL HODGKIN'S LYMPHOMA, NODULAR SCLEROSIS TYPE  . PORT-A-CATH REMOVAL Left 12/06/2013   Procedure: REMOVAL PORT-A-CATH;  Surgeon: [name], MD;  Location: [place];  Service: General;  Laterality: Left;  . PORTACATH PLACEMENT  06/10/2011   Procedure: INSERTION PORT-A-CATH;  Surgeon: [name], MD;  Location: [facility];  Service: General;  Laterality: Left;  subclavian   . THYROIDECTOMY  03/31/2012   Procedure: THYROIDECTOMY;  Surgeon: Izora Gala, MD;  Location: [place];  Service: ENT;  Laterality: N/A;  . TONSILLECTOMY     as a child  . VASECTOMY  1980    Current Outpatient Medications  Medication Sig Dispense Refill  . amLODipine (NORVASC) 5 MG tablet TAKE 1 TABLET BY MOUTH EVERY DAY 90 tablet 3  . aspirin EC 81 MG tablet Take 81 mg by mouth every evening.     . ezetimibe (ZETIA) 10 MG tablet  Take one tablet daily 90 tablet 0  . levothyroxine (SYNTHROID) 125 MCG tablet Take 125 mcg by mouth every morning.    . metoprolol succinate (TOPROL-XL) 50 MG 24 hr tablet TAKE 1 TABLET BY MOUTH EVERY DAY 90 tablet 3  . nitroGLYCERIN (NITROSTAT) 0.4 MG SL tablet PLACE 1 TABLET UNDER THE TONGUE EVERY 5 MINUTES AS NEEDED FOR CHEST PAIN 25 tablet 6  . PRALUENT 75 MG/ML SOAJ INJECT 1 PEN INTO THE SKIN EVERY 14 DAYS 2 pen 11  . rosuvastatin (CRESTOR) 10 MG tablet Take 1 tablet (10 mg total) by mouth 3 (three) times a week. TAKE 1 TABLET BY MOUTH 3 TIMES A WEEK AS TOLERATED. PLEASE MAKE ANNUAL APPT WITH DR FOR MORE REFILLS 45 tablet 0   No  current facility-administered medications for this visit.    Allergies  Allergen Reactions  . Crestor [Rosuvastatin]     Muscle aches  . Cialis [Tadalafil]     headache    Social History   Socioeconomic History  . Marital status: Married    Spouse name: Not on file  . Number of children: 2  . Years of education: Not on file  . Highest education level: Not on file  Occupational History  . 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   Tobacco Use  . Smoking status: Never Smoker  . Smokeless tobacco: Never Used  Substance and Sexual Activity  . Alcohol use: Yes    Comment: 10/02/2013 "couple beers; couple times/month"  . Drug use: No  . Sexual activity: Never  Other Topics Concern  . Not on file  Social History Narrative  . Not on file   Social Determinants of Health   Financial Resource Strain:   . Difficulty of Paying Living Expenses:   Food Insecurity:   . Worried About Charity fundraiser in the Last Year:   . Arboriculturist in the Last Year:   Transportation Needs:   . Film/video editor (Medical):   Marland Kitchen Lack of Transportation (Non-Medical):   Physical Activity:   . Days of Exercise per Week:   . Minutes of Exercise per Session:   Stress:   . Feeling of Stress :   Social Connections:   . Frequency of Communication with Friends and Family:   . Frequency of Social Gatherings with Friends and Family:   . Attends Religious Services:   . Active Member of Clubs or Organizations:   . Attends Archivist Meetings:   Marland Kitchen Marital Status:   Intimate Partner Violence:   . Fear of Current or Ex-Partner:   . Emotionally Abused:   Marland Kitchen Physically Abused:   . Sexually Abused:     Family History  Problem Relation Age of Onset  . Arthritis Mother   . Hypertension Mother   . Diabetes Father   . Coronary artery disease Father 58       Died age 29  . Kidney disease Father   . Hypertension Sister   .  Hypertension Sister        2nd Sister  . Alcohol abuse Brother   . Prostate cancer Paternal Uncle        Great  . Cancer Neg Hx   . Drug abuse Neg Hx   . Early death Neg Hx   . Stroke Neg Hx     Review of Systems:  As stated in the HPI and otherwise negative.  BP 114/72   Pulse 64   Ht 5' 10.5" (1.791 m)   Wt 235 lb 12.8 oz (107 kg)   SpO2 95%   BMI 33.36 kg/m   Physical Examination:  General: Well developed, well nourished, NAD  HEENT: OP clear, mucus membranes moist  SKIN: warm, dry. No rashes. Neuro: No focal deficits  Musculoskeletal: Muscle strength 5/5 all ext  Psychiatric: Mood and affect normal  Neck: No JVD, no carotid bruits, no thyromegaly, no lymphadenopathy.  Lungs:Clear bilaterally, no wheezes, rhonci, crackles Cardiovascular: Regular rate and rhythm. No murmurs, gallops or rubs. Abdomen:Soft. Bowel sounds present. Non-tender.  Extremities: No lower extremity edema. Pulses are 2 + in the bilateral DP/PT.  EKG:  EKG is not ordered today. The ekg ordered today demonstrates   Recent Labs: 01/09/2019: TSH 0.16 02/01/2019: ALT 9; BUN 23; Creatinine, Ser 1.68; Hemoglobin 15.8; Platelets 194.0; Potassium 4.5; Sodium 143   Lipid Panel    Component Value Date/Time   CHOL 106 02/01/2019 0924   CHOL 94 (L) 11/18/2017 0830   TRIG 62.0 02/01/2019 0924   HDL 47.40 02/01/2019 0924   HDL 51 11/18/2017 0830   CHOLHDL 2 02/01/2019 0924   VLDL 12.4 02/01/2019 0924   LDLCALC 46 02/01/2019 0924   LDLCALC 32 11/18/2017 0830     Wt Readings from Last 3 Encounters:  07/16/19 235 lb 12.8 oz (107 kg)  02/01/19 242 lb (109.8 kg)  06/30/18 241 lb (109.3 kg)     Other studies Reviewed: Additional studies/ records that were reviewed today include: . Review of the above records demonstrates:    Assessment and Plan:   1. CAD s/p CABG without angina: No chest pain. Continue ASA, statin, Zetia, Praluent and beta blocker.   2. HTN: BP is controlled. Continue current  therapy. He has not been taking Norvasc.   3. HLD: LDL at goal. Continue Crestor, Zetia and Praluent  Current medicines are reviewed at length with the patient today.  The patient does not have concerns regarding medicines.  The following changes have been made:  no change  Labs/ tests ordered today include:   No orders of the defined types were placed in this encounter.    Disposition:   FU with me in 12 months   Signed, Lauree Chandler, MD 07/16/2019 9:07 AM    [facility] [address], [place], [place]  [address] Phone: [phone]; Fax: [phone]

## 2019-07-26 ENCOUNTER — Other Ambulatory Visit: Payer: Self-pay | Admitting: Cardiovascular Disease

## 2019-08-14 ENCOUNTER — Other Ambulatory Visit: Payer: Self-pay | Admitting: Cardiovascular Disease

## 2019-10-04 LAB — HM DIABETES EYE EXAM

## 2019-10-19 ENCOUNTER — Other Ambulatory Visit: Payer: Self-pay | Admitting: Cardiovascular Disease

## 2019-10-19 ENCOUNTER — Telehealth: Payer: Self-pay

## 2019-10-19 NOTE — Telephone Encounter (Deleted)
error 

## 2019-10-22 ENCOUNTER — Telehealth: Payer: Managed Care, Other (non HMO) | Admitting: Internal Medicine

## 2019-10-23 ENCOUNTER — Encounter: Payer: Self-pay | Admitting: Family

## 2019-10-23 ENCOUNTER — Other Ambulatory Visit: Payer: Self-pay

## 2019-10-23 ENCOUNTER — Ambulatory Visit (INDEPENDENT_AMBULATORY_CARE_PROVIDER_SITE_OTHER)
Admission: RE | Admit: 2019-10-23 | Discharge: 2019-10-23 | Disposition: A | Discharge status: Home / Self Care | Payer: Managed Care, Other (non HMO) | Source: Ambulatory Visit | Attending: Family | Admitting: Family

## 2019-10-23 ENCOUNTER — Telehealth (INDEPENDENT_AMBULATORY_CARE_PROVIDER_SITE_OTHER): Payer: Managed Care, Other (non HMO) | Admitting: Family

## 2019-10-23 ENCOUNTER — Other Ambulatory Visit (INDEPENDENT_AMBULATORY_CARE_PROVIDER_SITE_OTHER): Payer: Managed Care, Other (non HMO)

## 2019-10-23 DIAGNOSIS — I10 Essential (primary) hypertension: Secondary | ICD-10-CM

## 2019-10-23 DIAGNOSIS — R05 Cough: Secondary | ICD-10-CM

## 2019-10-23 DIAGNOSIS — E118 Type 2 diabetes mellitus with unspecified complications: Secondary | ICD-10-CM

## 2019-10-23 DIAGNOSIS — R059 Cough, unspecified: Secondary | ICD-10-CM

## 2019-10-23 DIAGNOSIS — I2510 Atherosclerotic heart disease of native coronary artery without angina pectoris: Secondary | ICD-10-CM

## 2019-10-23 LAB — CBC WITH DIFFERENTIAL/PLATELET
Basophils Absolute: 0.1 10*3/uL (ref 0.0–0.1)
Basophils Relative: 1 % (ref 0.0–3.0)
Eosinophils Absolute: 0.4 10*3/uL (ref 0.0–0.7)
Eosinophils Relative: 5.3 % — ABNORMAL HIGH (ref 0.0–5.0)
HCT: 51 % (ref 39.0–52.0)
Hemoglobin: 17 g/dL (ref 13.0–17.0)
Lymphocytes Relative: 30.3 % (ref 12.0–46.0)
Lymphs Abs: 2 10*3/uL (ref 0.7–4.0)
MCHC: 33.3 g/dL (ref 30.0–36.0)
MCV: 87.8 fl (ref 78.0–100.0)
Monocytes Absolute: 0.5 10*3/uL (ref 0.1–1.0)
Monocytes Relative: 7.9 % (ref 3.0–12.0)
Neutro Abs: 3.7 10*3/uL (ref 1.4–7.7)
Neutrophils Relative %: 55.5 % (ref 43.0–77.0)
Platelets: 215 10*3/uL (ref 150.0–400.0)
RBC: 5.8 Mil/uL (ref 4.22–5.81)
RDW: 15.8 % — ABNORMAL HIGH (ref 11.5–15.5)
WBC: 6.7 10*3/uL (ref 4.0–10.5)

## 2019-10-23 LAB — COMPREHENSIVE METABOLIC PANEL
ALT: 15 U/L (ref 0–53)
AST: 23 U/L (ref 0–37)
Albumin: 4.6 g/dL (ref 3.5–5.2)
Alkaline Phosphatase: 55 U/L (ref 39–117)
BUN: 24 mg/dL — ABNORMAL HIGH (ref 6–23)
CO2: 22 mEq/L (ref 19–32)
Calcium: 9.9 mg/dL (ref 8.4–10.5)
Chloride: 107 mEq/L (ref 96–112)
Creatinine, Ser: 1.96 mg/dL — ABNORMAL HIGH (ref 0.40–1.50)
GFR: 41.36 mL/min — ABNORMAL LOW (ref >60.00)
Glucose, Bld: 98 mg/dL (ref 70–99)
Potassium: 4.1 mEq/L (ref 3.5–5.1)
Sodium: 140 mEq/L (ref 135–145)
Total Bilirubin: 0.6 mg/dL (ref 0.2–1.2)
Total Protein: 8.1 g/dL (ref 6.0–8.3)

## 2019-10-23 LAB — HEMOGLOBIN A1C: Hgb A1c MFr Bld: 6.6 % — ABNORMAL HIGH (ref 4.6–6.5)

## 2019-10-23 MED ORDER — ALBUTEROL SULFATE HFA 108 (90 BASE) MCG/ACT IN AERS
2.0000 | INHALATION_SPRAY | Freq: Four times a day (QID) | RESPIRATORY_TRACT | 2 refills | Status: DC | PRN
Start: 2019-10-23 — End: 2020-02-11

## 2019-10-23 NOTE — Progress Notes (Signed)
Devon Brady is a 69 y.o. male with the following history as recorded in EpicCare:  Patient Active Problem List   Diagnosis Date Noted  . Essential hypertension 01/31/2018  . Microalbuminuria due to type 2 diabetes mellitus ([place]) 01/31/2018  . Conductive hearing loss, bilateral 01/30/2018  . BPH (benign prostatic hyperplasia) 12/31/2014  . Obesity (BMI 35.0-39.9 without comorbidity) 10/04/2013  . Hyperlipidemia with target LDL less than 100 05/08/2013  . Hypothyroidism, 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  . Type II diabetes mellitus with manifestations ([place]) 11/22/2012  . Routine general medical examination at a health care facility 12/13/2011  . History of Hodgkin's lymphoma   . Hydronephrosis   . Primary thyroid papillary carcinoma ([place])   . Chronic renal disease, stage 3, moderately decreased glomerular filtration rate (GFR) between 30-59 mL/min/1.73 square meter   . CAD (coronary artery disease) 10/13/2010  . Erectile dysfunction 10/13/2010    Current Outpatient Medications  Medication Sig Dispense Refill  . albuterol (VENTOLIN HFA) 108 (90 Base) MCG/ACT inhaler Inhale 2 puffs into the lungs every 6 (six) hours as needed for wheezing or shortness of breath. 8 g 2  . amLODipine (NORVASC) 5 MG tablet TAKE 1 TABLET BY MOUTH EVERY DAY 90 tablet 3  . aspirin EC 81 MG tablet Take 81 mg by mouth every evening.     . ezetimibe (ZETIA) 10 MG tablet Take 1 tablet (10 mg total) by mouth daily. 90 tablet 2  . levothyroxine (SYNTHROID) 125 MCG tablet Take 125 mcg by mouth every morning.    . metoprolol succinate (TOPROL-XL) 50 MG 24 hr tablet TAKE 1 TABLET BY MOUTH EVERY DAY 90 tablet 3  . nitroGLYCERIN (NITROSTAT) 0.4 MG SL tablet PLACE 1 TABLET UNDER THE TONGUE EVERY 5 MINUTES AS NEEDED FOR CHEST PAIN 25 tablet 6  . PRALUENT 75 MG/ML SOAJ INJECT 1 PEN INTO THE SKIN EVERY 14 DAYS 2 pen 11  . rosuvastatin (CRESTOR) 10 MG tablet TAKE 1 TABLET BY MOUTH 3 TIMES A WEEK AS TOLERATED. PLEASE MAKE ANNUAL  APPT WITH DR FOR MORE REFILLS 45 tablet 3   No current facility-administered medications for this visit.    Allergies: Crestor [rosuvastatin] and Cialis [tadalafil]  Past Medical History:  Diagnosis Date  . CAD (coronary artery disease)   . H/O colonoscopy 2013  . H/O hiatal hernia   . Hemorrhoid   . History of colon polyps   . Hodgkin's lymphoma ([place])   . Hydronephrosis   . Hyperlipidemia   . Hypertension   . Hypothyroidism   . Myocardial infarction [facility]) 2011   "mild one, before the CABG"  . Neuropathy due to chemotherapeutic drug ([place])    GRADE 2  . Nocturia   . Papillary thyroid carcinoma ([place]) 03/2012   s/p total thyroidectomy  . Renal insufficiency   . Sleep apnea    No CPAP  . TIA (transient ischemic attack) 2011    Past Surgical History:  Procedure Laterality Date  . BONE MARROW ASPIRATE,BIOPSY, AND CLOT  06/11/11   LEFT ILIAC CREAST  . BUNIONECTOMY Right 1978   foot  . CARDIAC CATHETERIZATION  10/15/2009  . COLONOSCOPY    . CORONARY ARTERY BYPASS GRAFT  10/17/2009   LIMA to LAD,SVG to Ramus,SVG to OM sequential to OM2, SVG to marginal of RCA  . ESOPHAGOGASTRODUODENOSCOPY    . LEFT HEART CATHETERIZATION WITH CORONARY/GRAFT ANGIOGRAM N/A 10/03/2013   Procedure: LEFT HEART CATHETERIZATION WITH Beatrix Fetters;  Surgeon: Sinclair Grooms, MD;  Location[facility] CATH LAB;  Service: Cardiovascular;  Laterality: N/A;  . LYMPH NODE BIOPSY  06/07/11   RIGHT INGUINAL NODE: CLASSICAL HODGKIN'S LYMPHOMA, NODULAR SCLEROSIS TYPE  . PORT-A-CATH REMOVAL Left 12/06/2013   Procedure: REMOVAL PORT-A-CATH;  Surgeon: [name], MD;  Location: [facility];  Service: General;  Laterality: Left;  . PORTACATH PLACEMENT  06/10/2011   Procedure: INSERTION PORT-A-CATH;  Surgeon: [name], MD;  Location: [facility];  Service: General;  Laterality: Left;  subclavian   . THYROIDECTOMY  03/31/2012   Procedure: THYROIDECTOMY;  Surgeon: Izora Gala, MD;  Location: [facility];  Service:  ENT;  Laterality: N/A;  . TONSILLECTOMY     as a child  . VASECTOMY  1980    Family History  Problem Relation Age of Onset  . Arthritis Mother   . Hypertension Mother   . Diabetes Father   . Coronary artery disease Father 17       Died age 2  . Kidney disease Father   . Hypertension Sister   . Hypertension Sister        2nd Sister  . Alcohol abuse Brother   . Prostate cancer Paternal Uncle        Great  . Cancer Neg Hx   . Drug abuse Neg Hx   . Early death Neg Hx   . Stroke Neg Hx     Social History   Tobacco Use  . Smoking status: Never Smoker  . Smokeless tobacco: Never Used  Substance Use Topics  . Alcohol use: Yes    Comment: 10/02/2013 "couple beers; couple times/month"    Subjective:   I connected with Devon Brady Resides on 10/23/19 at 10:40 AM EDT by a video enabled telemedicine application and verified that I am speaking with the correct person using two identifiers.   I discussed the limitations of evaluation and management by telemedicine and the availability of in person appointments. The patient expressed understanding and agreed to proceed. Provider in office/ patient is at home; provider and patient are only 2 people on video call.   Patient complaining of cough/ congestion x 2 week; "felt like his chest was rattling" initially; saw a provider at his office and was prescribed a Z-pak after the first week; notes that last Thursday, he actually felt worse and went and got a COVID test- negative; does have an inhaler that was prescribed by the provider at his job but has not started; notes that today he is actually starting to feel much better as of today;  Is also concerned about his kidney functions- notes that they were checked last week when he went to see his employer and his creatinine was elevated at 1.5; has not seen his PCP since November 2020;     Objective:  There were no vitals filed for this visit.  General: Well developed, well nourished, in no  acute distress  Skin : Warm and dry.  Head: Normocephalic and atraumatic  Lungs: Respirations unlabored; clear to auscultation bilaterally without wheeze, rales, rhonchi  Neurologic: Alert and oriented; speech intact; face symmetrical; moves all extremities well; CNII-XII intact without focal deficit   Assessment:  1. Cough   2. Essential hypertension   3. Type II diabetes mellitus with manifestations ([place])     Plan:  1. Has been treated for bronchitis; has negative COVID test; will update CXR today; encouraged to use albuterol inhaler as prescribed- refill updated; 2. Check CMP today; he will see his PCP in 2 weeks to review labs; 3.  Check Hgba1c today;   No follow-ups on file.  Orders Placed This Encounter  Procedures  . DG Chest 2 View    Standing Status:   Future    Number of Occurrences:   1    Standing Expiration Date:   10/22/2020    Order Specific Question:   Reason for Exam (SYMPTOM  OR DIAGNOSIS REQUIRED)    Answer:   cough x 2 weeks/ negative COVID    Order Specific Question:   Preferred imaging location?    Answer:   [facility]    Order Specific Question:   Radiology Contrast Protocol - do NOT remove file path    Answer:   [email or web address]  . Comp Met (CMET)    Standing Status:   Future    Number of Occurrences:   1    Standing Expiration Date:   10/22/2020  . CBC with Differential/Platelet    Standing Status:   Future    Number of Occurrences:   1    Standing Expiration Date:   10/22/2020    Requested Prescriptions   Signed Prescriptions Disp Refills  . albuterol (VENTOLIN HFA) 108 (90 Base) MCG/ACT inhaler 8 g 2    Sig: Inhale 2 puffs into the lungs every 6 (six) hours as needed for wheezing or shortness of breath.

## 2019-11-05 ENCOUNTER — Ambulatory Visit (INDEPENDENT_AMBULATORY_CARE_PROVIDER_SITE_OTHER): Payer: Managed Care, Other (non HMO) | Admitting: Internal Medicine

## 2019-11-05 ENCOUNTER — Telehealth: Payer: Self-pay

## 2019-11-05 ENCOUNTER — Encounter: Payer: Self-pay | Admitting: Internal Medicine

## 2019-11-05 ENCOUNTER — Other Ambulatory Visit: Payer: Self-pay

## 2019-11-05 VITALS — BP 128/84 | HR 63 | Temp 98.3°F | Resp 16 | Ht 70.5 in | Wt 231.5 lb

## 2019-11-05 DIAGNOSIS — E890 Postprocedural hypothyroidism: Secondary | ICD-10-CM

## 2019-11-05 DIAGNOSIS — N1832 Chronic kidney disease, stage 3b: Secondary | ICD-10-CM

## 2019-11-05 DIAGNOSIS — E118 Type 2 diabetes mellitus with unspecified complications: Secondary | ICD-10-CM

## 2019-11-05 MED ORDER — DAPAGLIFLOZIN PROPANEDIOL 10 MG PO TABS: 10.0000 mg | ORAL_TABLET | Freq: Every day | ORAL | 1 refills | Status: DC

## 2019-11-05 NOTE — Patient Instructions (Signed)
Chronic Kidney Disease, Adult Chronic kidney disease (CKD) occurs when the kidneys become damaged slowly over a long period of time. The kidneys are a pair of organs that do many important jobs in the body, including:  Removing waste and extra fluid from the blood to make urine.  Making hormones that maintain the amount of fluid in tissues and blood vessels.  Maintaining the right amount of fluids and chemicals in the body. A small amount of kidney damage may not cause problems, but a large amount of damage may make it hard or impossible for the kidneys to work the way they should. If steps are not taken to slow down kidney damage or to stop it from getting worse, the kidneys may stop working permanently (end-stage renal disease or ESRD). Most of the time, CKD does not go away, but it can often be controlled. People who have CKD are usually able to live normal lives. What are the causes? The most common causes of this condition are diabetes and high blood pressure (hypertension). Other causes include:  Heart and blood vessel (cardiovascular) disease.  Kidney diseases, such as: ? Glomerulonephritis. ? Interstitial nephritis. ? Polycystic kidney disease. ? Renal vascular disease.  Diseases that affect the immune system.  Genetic diseases.  Medicines that damage the kidneys, such as anti-inflammatory medicines.  Being around or being in contact with poisonous (toxic) substances.  A kidney or urinary infection that occurs again and again (recurs).  Vasculitis. This is swelling or inflammation of the blood vessels.  A problem with urine flow that may be caused by: ? Cancer. ? Having kidney stones more than one time. ? An enlarged prostate, in males. What increases the risk? You are more likely to develop this condition if you:  Are older than age 60.  Are male.  Are African-American, Hispanic, Asian, Pacific Islander, or American Indian.  Are a current or former smoker.   Are obese.  Have a family history of kidney disease or failure.  Often take medicines that are damaging to the kidneys. What are the signs or symptoms? Symptoms of this condition include:  Swelling (edema) of the face, legs, ankles, or feet.  Tiredness (lethargy) and having less energy.  Nausea or vomiting.  Confusion or trouble concentrating.  Problems with urination, such as: ? Painful or burning feeling during urination. ? Decreased urine production. ? Frequent urination, especially at night. ? Bloody urine.  Muscle twitches and cramps, especially in the legs.  Shortness of breath.  Weakness.  Loss of appetite.  Metallic taste in the mouth.  Trouble sleeping.  Dry, itchy skin.  A low blood count (anemia).  Pale lining of the eyelids and surface of the eye (conjunctiva). Symptoms develop slowly and may not be obvious until the kidney damage becomes severe. It is possible to have kidney disease for years without having any symptoms. How is this diagnosed? This condition may be diagnosed based on:  Blood tests.  Urine tests.  Imaging tests, such as an ultrasound or CT scan.  A test in which a sample of tissue is removed from the kidneys to be examined under a microscope (kidney biopsy). These test results will help your health care provider determine how serious the CKD is. How is this treated? There is no cure for most cases of this condition, but treatment usually relieves symptoms and prevents or slows the progression of the disease. Treatment may include:  Making diet changes, which may require you to avoid alcohol, salty foods (sodium),   and foods that are high in potassium, calcium, and protein.  Medicines: ? To lower blood pressure. ? To control blood glucose. ? To relieve anemia. ? To relieve swelling. ? To protect your bones. ? To improve the balance of electrolytes in your blood.  Removing toxic waste from the body through types of dialysis, if  the kidneys can no longer do their job (kidney failure).  Managing any other conditions that are causing your CKD or making it worse. Follow these instructions at home: Medicines  Take over-the-counter and prescription medicines only as told by your health care provider. The dose of some medicines that you take may need to be adjusted.  Do not take any new medicines unless approved by your health care provider. Many medicines can worsen your kidney damage.  Do not take any vitamin and mineral supplements unless approved by your health care provider. Many nutritional supplements can worsen your kidney damage. General instructions  Follow your prescribed diet as told by your health care provider.  Do not use any products that contain nicotine or tobacco, such as cigarettes and e-cigarettes. If you need help quitting, ask your health care provider.  Monitor and track your blood pressure at home. Report changes in your blood pressure as told by your health care provider.  If you are being treated for diabetes, monitor and track your blood sugar (blood glucose) levels as told by your health care provider.  Maintain a healthy weight. If you need help with this, ask your health care provider.  Start or continue an exercise plan. Exercise at least 30 minutes a day, 5 days a week.  Keep your immunizations up to date as told by your health care provider.  Keep all follow-up visits as told by your health care provider. This is important. Where to find more information  American Association of Kidney Patients: www.aakp.org  National Kidney Foundation: www.kidney.org  American Kidney Fund: www.akfinc.org  Life Options [facility] Program: www.lifeoptions.org and www.kidneyschool.org Contact a health care provider if:  Your symptoms get worse.  You develop new symptoms. Get help right away if:  You develop symptoms of ESRD, which include: ? Headaches. ? Numbness in the hands or  feet. ? Easy bruising. ? Frequent hiccups. ? Chest pain. ? Shortness of breath. ? Lack of menstruation, in women.  You have a fever.  You have decreased urine production.  You have pain or bleeding when you urinate. Summary  Chronic kidney disease (CKD) occurs when the kidneys become damaged slowly over a long period of time.  The most common causes of this condition are diabetes and high blood pressure (hypertension).  There is no cure for most cases of this condition, but treatment usually relieves symptoms and prevents or slows the progression of the disease. Treatment may include a combination of medicines and lifestyle changes. This information is not intended to replace advice given to you by your health care provider. Make sure you discuss any questions you have with your health care provider. Document Revised: 02/11/2017 Document Reviewed: 04/08/2016 Elsevier Patient Education  2020 Elsevier Inc.  

## 2019-11-05 NOTE — Telephone Encounter (Signed)
He called and left a message. He missed last years appt due to covid. He would like to reschedule appt. His creatinine level has went up recently.

## 2019-11-05 NOTE — Progress Notes (Signed)
Subjective:  Patient ID: Devon Brady, male    DOB: 07/20/1950  Age: 69 y.o. MRN: [id number]  CC: Allergic Rhinitis , Hypertension, and Diabetes  This visit occurred during the SARS-CoV-2 public health emergency.  Safety protocols were in place, including screening questions prior to the visit, additional usage of staff PPE, and extensive cleaning of exam room while observing appropriate contact time as indicated for disinfecting solutions.    HPI [name] presents for f/up - His only complaint today is nasal congestion. He is concerned about his renal function and wants a referral back to see his nephrologist.  Outpatient Medications Prior to Visit  Medication Sig Dispense Refill  . albuterol (VENTOLIN HFA) 108 (90 Base) MCG/ACT inhaler Inhale 2 puffs into the lungs every 6 (six) hours as needed for wheezing or shortness of breath. 8 g 2  . amLODipine (NORVASC) 5 MG tablet TAKE 1 TABLET BY MOUTH EVERY DAY 90 tablet 3  . aspirin EC 81 MG tablet Take 81 mg by mouth every evening.     . ezetimibe (ZETIA) 10 MG tablet Take 1 tablet (10 mg total) by mouth daily. 90 tablet 2  . levothyroxine (SYNTHROID) 125 MCG tablet Take 125 mcg by mouth every morning.    . metoprolol succinate (TOPROL-XL) 50 MG 24 hr tablet TAKE 1 TABLET BY MOUTH EVERY DAY 90 tablet 3  . PRALUENT 75 MG/ML SOAJ INJECT 1 PEN INTO THE SKIN EVERY 14 DAYS 2 pen 11  . rosuvastatin (CRESTOR) 10 MG tablet TAKE 1 TABLET BY MOUTH 3 TIMES A WEEK AS TOLERATED. PLEASE MAKE ANNUAL APPT WITH DR FOR MORE REFILLS 45 tablet 3  . nitroGLYCERIN (NITROSTAT) 0.4 MG SL tablet PLACE 1 TABLET UNDER THE TONGUE EVERY 5 MINUTES AS NEEDED FOR CHEST PAIN (Patient not taking: Reported on 11/05/2019) 25 tablet 6   No facility-administered medications prior to visit.    ROS Review of Systems  Constitutional: Negative.  Negative for chills, diaphoresis, fatigue and fever.  HENT: Positive for congestion. Negative for facial swelling,  postnasal drip, rhinorrhea, sinus pressure, sinus pain, tinnitus and trouble swallowing.   Eyes: Negative.   Respiratory: Negative for cough, chest tightness, shortness of breath and wheezing.   Cardiovascular: Negative for chest pain, palpitations and leg swelling.  Gastrointestinal: Negative for abdominal pain, constipation, diarrhea, nausea and vomiting.  Endocrine: Negative.   Genitourinary: Negative.  Negative for difficulty urinating.  Musculoskeletal: Negative.  Negative for arthralgias and myalgias.  Skin: Negative.  Negative for color change and pallor.  Neurological: Negative for dizziness, weakness, light-headedness, numbness and headaches.  Hematological: Negative for adenopathy. Does not bruise/bleed easily.  Psychiatric/Behavioral: Negative.     Objective:  BP 128/84 (BP Location: Left Arm, Patient Position: Sitting, Cuff Size: Large)   Pulse 63   Temp 98.3 F (36.8 C) (Oral)   Resp 16   Ht 5' 10.5" (1.791 m)   Wt 231 lb 8 oz (105 kg)   SpO2 97%   BMI 32.75 kg/m   BP Readings from Last 3 Encounters:  11/05/19 128/84  07/16/19 114/72  02/01/19 130/80    Wt Readings from Last 3 Encounters:  11/05/19 231 lb 8 oz (105 kg)  07/16/19 235 lb 12.8 oz (107 kg)  02/01/19 242 lb (109.8 kg)    Physical Exam Vitals reviewed.  Constitutional:      Appearance: Normal appearance.  HENT:     Nose: Mucosal edema present. No congestion or rhinorrhea.     Right Nostril: No  epistaxis.     Left Nostril: No epistaxis.     Right Turbinates: Not enlarged, swollen or pale.     Left Turbinates: Not enlarged, swollen or pale.     Right Sinus: No maxillary sinus tenderness or frontal sinus tenderness.     Left Sinus: No maxillary sinus tenderness or frontal sinus tenderness.     Mouth/Throat:     Mouth: Mucous membranes are moist.  Eyes:     General: No scleral icterus.    Conjunctiva/sclera: Conjunctivae normal.  Cardiovascular:     Rate and Rhythm: Normal rate and regular  rhythm.     Heart sounds: No murmur heard.   Pulmonary:     Effort: Pulmonary effort is normal.     Breath sounds: No stridor. No wheezing, rhonchi or rales.  Abdominal:     General: Abdomen is flat. Bowel sounds are normal. There is no distension.     Palpations: Abdomen is soft. There is no hepatomegaly, splenomegaly or mass.     Tenderness: There is no abdominal tenderness.  Musculoskeletal:        General: Normal range of motion.     Cervical back: Neck supple.     Right lower leg: No edema.     Left lower leg: No edema.  Lymphadenopathy:     Cervical: No cervical adenopathy.  Skin:    General: Skin is warm and dry.     Coloration: Skin is not pale.  Neurological:     General: No focal deficit present.     Mental Status: He is alert.     Lab Results  Component Value Date   WBC 6.7 10/23/2019   HGB 17.0 10/23/2019   HCT 51.0 10/23/2019   PLT 215.0 10/23/2019   GLUCOSE 98 10/23/2019   CHOL 106 02/01/2019   TRIG 62.0 02/01/2019   HDL 47.40 02/01/2019   LDLCALC 46 02/01/2019   ALT 15 10/23/2019   AST 23 10/23/2019   NA 140 10/23/2019   K 4.1 10/23/2019   CL 107 10/23/2019   CREATININE 1.96 (H) 10/23/2019   BUN 24 (H) 10/23/2019   CO2 22 10/23/2019   TSH 0.16 (A) 01/09/2019   PSA 1.69 02/01/2019   INR 0.97 10/02/2013   HGBA1C 6.6 (H) 10/23/2019   MICROALBUR 6.4 (H) 02/01/2019    DG Chest 2 View  Result Date: 10/23/2019 CLINICAL DATA:  Cough and shortness of breath for 2 weeks. Coronary artery disease. EXAM: CHEST - 2 VIEW COMPARISON:  01/26/2017 FINDINGS: The heart size and mediastinal contours are within normal limits. 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 Both lungs are clear. The visualized skeletal structures are unremarkable. IMPRESSION: No active cardiopulmonary disease. Electronically Signed   By: Marlaine Hind M.D.   On: 10/23/2019 17:11    Assessment & Plan:   Arif was seen today for allergic rhinitis , hypertension and diabetes.  Diagnoses and all orders  for this visit:  Hypothyroidism, postsurgical- He sees his ENDO about this next month.  Type II diabetes mellitus with manifestations ([place])- I have asked him to start taking an SGLT-2 inh for CV and renal risk reduction. -     dapagliflozin propanediol (FARXIGA) 10 MG TABS tablet; Take 1 tablet (10 mg total) by mouth daily before breakfast.  Stage 3b chronic kidney disease-  Will start an SGLT-2 inh for risk reduction -     Ambulatory referral to Nephrology   I am having Cletus Gash B. Pawling start on dapagliflozin propanediol. I am also having  him maintain his aspirin EC, nitroGLYCERIN, Praluent, levothyroxine, amLODipine, rosuvastatin, metoprolol succinate, ezetimibe, and albuterol.  Meds ordered this encounter  Medications  . dapagliflozin propanediol (FARXIGA) 10 MG TABS tablet    Sig: Take 1 tablet (10 mg total) by mouth daily before breakfast.    Dispense:  90 tablet    Refill:  1     Follow-up: Return in about 3 months (around 02/05/2020).  Scarlette Calico, MD

## 2019-11-06 NOTE — Telephone Encounter (Signed)
Called and given below message. He verbalized understanding and has already contacted PCP.

## 2019-11-06 NOTE — Telephone Encounter (Signed)
1) I have not seen him since 2018 because from our last discussion he does not need to come back. He is a long term cancer survivor 2) his high creatinine has nothing to do with his previous diagnosis. I suggest he discuss with his primary doctor about that

## 2019-11-21 LAB — BASIC METABOLIC PANEL
BUN: 20 (ref 4–21)
CO2: 25 — AB (ref 13–22)
Chloride: 106 (ref 99–108)
Creatinine: 1.5 — AB (ref 0.6–1.3)
Glucose: 106
Potassium: 4.1 (ref 3.4–5.3)
Sodium: 144 (ref 137–147)

## 2019-11-21 LAB — COMPREHENSIVE METABOLIC PANEL
Albumin: 4.5 (ref 3.5–5.0)
Calcium: 9.6 (ref 8.7–10.7)

## 2019-12-29 ENCOUNTER — Other Ambulatory Visit: Payer: Self-pay | Admitting: Cardiovascular Disease

## 2019-12-31 LAB — COMPREHENSIVE METABOLIC PANEL: Calcium: 9.5 (ref 8.7–10.7)

## 2019-12-31 LAB — BASIC METABOLIC PANEL
BUN: 21 (ref 4–21)
CO2: 23 — AB (ref 13–22)
Chloride: 107 (ref 99–108)
Creatinine: 1.6 — AB (ref 0.6–1.3)
Glucose: 99
Potassium: 4.1 (ref 3.4–5.3)
Sodium: 145 (ref 137–147)

## 2019-12-31 LAB — TSH: TSH: 5.32 (ref 0.41–5.90)

## 2020-01-14 ENCOUNTER — Encounter (INDEPENDENT_AMBULATORY_CARE_PROVIDER_SITE_OTHER): Payer: Medicare Other | Admitting: Ophthalmology

## 2020-01-14 ENCOUNTER — Other Ambulatory Visit: Payer: Self-pay

## 2020-01-14 ENCOUNTER — Encounter (INDEPENDENT_AMBULATORY_CARE_PROVIDER_SITE_OTHER): Payer: Managed Care, Other (non HMO) | Admitting: Ophthalmology

## 2020-01-14 DIAGNOSIS — I10 Essential (primary) hypertension: Secondary | ICD-10-CM

## 2020-01-14 DIAGNOSIS — H353111 Nonexudative age-related macular degeneration, right eye, early dry stage: Secondary | ICD-10-CM | POA: Diagnosis not present

## 2020-01-14 DIAGNOSIS — H35372 Puckering of macula, left eye: Secondary | ICD-10-CM | POA: Diagnosis not present

## 2020-01-14 DIAGNOSIS — H43813 Vitreous degeneration, bilateral: Secondary | ICD-10-CM

## 2020-01-14 DIAGNOSIS — H35033 Hypertensive retinopathy, bilateral: Secondary | ICD-10-CM | POA: Diagnosis not present

## 2020-01-15 LAB — HM DIABETES EYE EXAM

## 2020-01-22 ENCOUNTER — Encounter (INDEPENDENT_AMBULATORY_CARE_PROVIDER_SITE_OTHER): Payer: Medicare Other | Admitting: Ophthalmology

## 2020-02-05 ENCOUNTER — Other Ambulatory Visit: Payer: Self-pay | Admitting: Internal Medicine

## 2020-02-11 ENCOUNTER — Encounter: Payer: Self-pay | Admitting: Internal Medicine

## 2020-02-11 ENCOUNTER — Other Ambulatory Visit: Payer: Self-pay

## 2020-02-11 ENCOUNTER — Ambulatory Visit (INDEPENDENT_AMBULATORY_CARE_PROVIDER_SITE_OTHER): Payer: Managed Care, Other (non HMO)

## 2020-02-11 ENCOUNTER — Ambulatory Visit (INDEPENDENT_AMBULATORY_CARE_PROVIDER_SITE_OTHER): Payer: Managed Care, Other (non HMO) | Admitting: Internal Medicine

## 2020-02-11 VITALS — BP 138/84 | HR 60 | Temp 98.1°F | Resp 16 | Ht 70.5 in | Wt 236.0 lb

## 2020-02-11 DIAGNOSIS — Z0001 Encounter for general adult medical examination with abnormal findings: Secondary | ICD-10-CM | POA: Diagnosis not present

## 2020-02-11 DIAGNOSIS — M79672 Pain in left foot: Secondary | ICD-10-CM | POA: Diagnosis not present

## 2020-02-11 DIAGNOSIS — I10 Essential (primary) hypertension: Secondary | ICD-10-CM

## 2020-02-11 DIAGNOSIS — N1832 Chronic kidney disease, stage 3b: Secondary | ICD-10-CM

## 2020-02-11 DIAGNOSIS — G8929 Other chronic pain: Secondary | ICD-10-CM | POA: Diagnosis not present

## 2020-02-11 DIAGNOSIS — E118 Type 2 diabetes mellitus with unspecified complications: Secondary | ICD-10-CM

## 2020-02-11 DIAGNOSIS — E785 Hyperlipidemia, unspecified: Secondary | ICD-10-CM | POA: Diagnosis not present

## 2020-02-11 DIAGNOSIS — E1129 Type 2 diabetes mellitus with other diabetic kidney complication: Secondary | ICD-10-CM | POA: Diagnosis not present

## 2020-02-11 DIAGNOSIS — N400 Enlarged prostate without lower urinary tract symptoms: Secondary | ICD-10-CM

## 2020-02-11 DIAGNOSIS — M7732 Calcaneal spur, left foot: Secondary | ICD-10-CM

## 2020-02-11 DIAGNOSIS — E890 Postprocedural hypothyroidism: Secondary | ICD-10-CM

## 2020-02-11 DIAGNOSIS — R809 Proteinuria, unspecified: Secondary | ICD-10-CM

## 2020-02-11 DIAGNOSIS — Z23 Encounter for immunization: Secondary | ICD-10-CM | POA: Insufficient documentation

## 2020-02-11 LAB — HEMOGLOBIN A1C: Hgb A1c MFr Bld: 6.1 % (ref 4.6–6.5)

## 2020-02-11 LAB — HEPATIC FUNCTION PANEL
ALT: 16 U/L (ref 0–53)
AST: 20 U/L (ref 0–37)
Albumin: 4.4 g/dL (ref 3.5–5.2)
Alkaline Phosphatase: 62 U/L (ref 39–117)
Bilirubin, Direct: 0.1 mg/dL (ref 0.0–0.3)
Total Bilirubin: 0.4 mg/dL (ref 0.2–1.2)
Total Protein: 7.2 g/dL (ref 6.0–8.3)

## 2020-02-11 LAB — LIPID PANEL
Cholesterol: 106 mg/dL (ref 0–200)
HDL: 55.7 mg/dL (ref >39.00)
LDL Cholesterol: 35 mg/dL (ref 0–99)
NonHDL: 50.78
Total CHOL/HDL Ratio: 2
Triglycerides: 77 mg/dL (ref 0.0–149.0)
VLDL: 15.4 mg/dL (ref 0.0–40.0)

## 2020-02-11 LAB — PSA: PSA: 1.81 ng/mL (ref 0.10–4.00)

## 2020-02-11 MED ORDER — TADALAFIL 5 MG PO TABS: 5.0000 mg | ORAL_TABLET | Freq: Every day | ORAL | 1 refills | Status: DC | PRN

## 2020-02-11 MED ORDER — SHINGRIX 50 MCG/0.5ML IM SUSR: 0.5000 mL | Freq: Once | INTRAMUSCULAR | 1 refills | Status: AC

## 2020-02-11 NOTE — Patient Instructions (Signed)

## 2020-02-11 NOTE — Progress Notes (Signed)
Subjective:  Patient ID: Devon Brady, male    DOB: September 28, 1950  Age: 69 y.o. MRN: [id number]  CC: Annual Exam, Hyperlipidemia, Diabetes, Hypothyroidism, and Hypertension  This visit occurred during the SARS-CoV-2 public health emergency.  Safety protocols were in place, including screening questions prior to the visit, additional usage of staff PPE, and extensive cleaning of exam room while observing appropriate contact time as indicated for disinfecting solutions.     HPI Devon Brady presents for a CPX.  He complains of chronic pain around his left heel for at least a year.  He denies any trauma or injury.  The pain is over the posterior aspect of the heel.  He does not experience pain on the bottom of his foot and he denies claudication.  He has not gotten much symptom relief with Tylenol.  He complains of nocturia and weak urine stream.  He says he gets up to 90 to urinate about every 2 hours.  He denies dysuria or hematuria.  He tells me his blood pressure is well controlled.  He is active and denies any recent episodes of chest pain, shortness of breath, palpitations, edema, or fatigue.  He also tells me his blood sugars are well controlled and he denies polys.   Outpatient Medications Prior to Visit  Medication Sig Dispense Refill  . amLODipine (NORVASC) 5 MG tablet TAKE 1 TABLET BY MOUTH EVERY DAY 90 tablet 3  . aspirin EC 81 MG tablet Take 81 mg by mouth every evening.     . dapagliflozin propanediol (FARXIGA) 10 MG TABS tablet Take 1 tablet (10 mg total) by mouth daily before breakfast. 90 tablet 1  . ezetimibe (ZETIA) 10 MG tablet Take 1 tablet (10 mg total) by mouth daily. 90 tablet 2  . metoprolol succinate (TOPROL-XL) 50 MG 24 hr tablet TAKE 1 TABLET BY MOUTH EVERY DAY 90 tablet 3  . nitroGLYCERIN (NITROSTAT) 0.4 MG SL tablet PLACE 1 TABLET UNDER THE TONGUE EVERY 5 MINUTES AS NEEDED FOR CHEST PAIN 25 tablet 6  . PRALUENT 75 MG/ML SOAJ INJECT 1 PEN INTO THE  SKIN EVERY 14 DAYS 2 mL 11  . rosuvastatin (CRESTOR) 10 MG tablet TAKE 1 TABLET BY MOUTH 3 TIMES A WEEK AS TOLERATED. PLEASE MAKE ANNUAL APPT WITH DR FOR MORE REFILLS 45 tablet 3  . albuterol (VENTOLIN HFA) 108 (90 Base) MCG/ACT inhaler Inhale 2 puffs into the lungs every 6 (six) hours as needed for wheezing or shortness of breath. 8 g 2  . levothyroxine (SYNTHROID) 125 MCG tablet Take 125 mcg by mouth every morning.     No facility-administered medications prior to visit.    ROS Review of Systems  Constitutional: Negative for appetite change, diaphoresis, fatigue and unexpected weight change.  HENT: Negative.   Eyes: Negative for visual disturbance.  Respiratory: Negative for cough, chest tightness and wheezing.   Cardiovascular: Negative for chest pain, palpitations and leg swelling.  Gastrointestinal: Negative for abdominal pain, constipation, diarrhea, nausea and vomiting.  Endocrine: Negative.  Negative for cold intolerance, heat intolerance, polydipsia, polyphagia and polyuria.  Genitourinary: Positive for difficulty urinating. Negative for dysuria, hematuria, scrotal swelling, testicular pain and urgency.       + nocturia and weak stream  Musculoskeletal: Positive for arthralgias. Negative for myalgias.  Skin: Negative.   Neurological: Negative.  Negative for dizziness, weakness, light-headedness and headaches.  Hematological: Negative for adenopathy. Does not bruise/bleed easily.  Psychiatric/Behavioral: Negative.     Objective:  BP 138/84  Pulse 60   Temp 98.1 F (36.7 C) (Oral)   Resp 16   Ht 5' 10.5" (1.791 m)   Wt 236 lb (107 kg)   SpO2 97%   BMI 33.38 kg/m   BP Readings from Last 3 Encounters:  02/11/20 138/84  11/05/19 128/84  07/16/19 114/72    Wt Readings from Last 3 Encounters:  02/11/20 236 lb (107 kg)  11/05/19 231 lb 8 oz (105 kg)  07/16/19 235 lb 12.8 oz (107 kg)    Physical Exam Vitals reviewed.  Constitutional:      Appearance: He is obese.   HENT:     Nose: Nose normal.     Mouth/Throat:     Mouth: Mucous membranes are moist.  Eyes:     General: No scleral icterus.    Conjunctiva/sclera: Conjunctivae normal.  Cardiovascular:     Rate and Rhythm: Normal rate and regular rhythm.     Heart sounds: No murmur heard.   Pulmonary:     Effort: Pulmonary effort is normal.     Breath sounds: No stridor. No wheezing, rhonchi or rales.  Abdominal:     General: Abdomen is protuberant. Bowel sounds are normal. There is no distension.     Palpations: Abdomen is soft. There is no fluid wave, hepatomegaly, splenomegaly or mass.     Tenderness: There is no abdominal tenderness.     Hernia: There is no hernia in the left inguinal area or right inguinal area.  Genitourinary:    Pubic Area: No rash.      Penis: Normal and circumcised. No discharge, swelling or lesions.      Testes: Normal.        Right: Mass, tenderness or swelling not present.        Left: Mass, tenderness or swelling not present.     Epididymis:     Right: Normal. Not inflamed or enlarged. No mass.     Left: Normal. Not inflamed or enlarged. No mass.     Prostate: Enlarged (1+ smooth symm BPH). Not tender and no nodules present.     Rectum: Normal. Guaiac result negative. No mass, tenderness, anal fissure, external hemorrhoid or internal hemorrhoid. Normal anal tone.  Musculoskeletal:        General: Normal range of motion.     Cervical back: Neck supple.     Right lower leg: No edema.     Left lower leg: No edema.     Right ankle: Normal.     Right Achilles Tendon: Normal.     Left ankle: No deformity. Tenderness present. Normal range of motion.     Left Achilles Tendon: Normal.     Comments: ttp at the insertion of the achilles on the calcaneous  Lymphadenopathy:     Cervical: No cervical adenopathy.     Lower Body: No right inguinal adenopathy. No left inguinal adenopathy.  Skin:    General: Skin is dry.     Coloration: Skin is not pale.  Neurological:      General: No focal deficit present.     Mental Status: He is alert.  Psychiatric:        Mood and Affect: Mood normal.     Lab Results  Component Value Date   WBC 6.7 10/23/2019   HGB 17.0 10/23/2019   HCT 51.0 10/23/2019   PLT 215.0 10/23/2019   GLUCOSE 98 10/23/2019   CHOL 106 02/11/2020   TRIG 77.0 02/11/2020   HDL 55.70 02/11/2020  LDLCALC 35 02/11/2020   ALT 16 02/11/2020   AST 20 02/11/2020   NA 145 12/31/2019   K 4.1 12/31/2019   CL 107 12/31/2019   CREATININE 1.6 (A) 12/31/2019   BUN 21 12/31/2019   CO2 23 (A) 12/31/2019   TSH 5.32 12/31/2019   PSA 1.81 02/11/2020   INR 0.97 10/02/2013   HGBA1C 6.1 02/11/2020   MICROALBUR 6.4 (H) 02/01/2019    DG Chest 2 View  Result Date: 10/23/2019 CLINICAL DATA:  Cough and shortness of breath for 2 weeks. Coronary artery disease. EXAM: CHEST - 2 VIEW COMPARISON:  01/26/2017 FINDINGS: The heart size and mediastinal contours are within normal limits. 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 Both lungs are clear. The visualized skeletal structures are unremarkable. IMPRESSION: No active cardiopulmonary disease. Electronically Signed   By: Marlaine Hind M.D.   On: 10/23/2019 17:11   No results found.   Assessment & Plan:   Smaran was seen today for annual exam, hyperlipidemia, diabetes, hypothyroidism and hypertension.  Diagnoses and all orders for this visit:  Essential hypertension- His blood pressure is adequately well controlled.  Recent electrolytes were normal and his renal function is stable.  Hypothyroidism, postsurgical- His recent TSH was in the acceptable range.  Microalbuminuria due to type 2 diabetes mellitus ([place])- Will continue the SGLT2 inhibitor.  Type II diabetes mellitus with manifestations ([place])- His A1c is at 6.1%.  His blood sugars are very well controlled. -     Hemoglobin A1c; Future -     HM Diabetes Foot Exam -     Hemoglobin A1c  Benign prostatic hyperplasia without lower urinary tract symptoms- He is  symptomatic with this so I recommended that he take a daily dose of tadalafil.  His PSA is normal which is a reassuring sign that he does not have prostate cancer. -     PSA; Future -     tadalafil (CIALIS) 5 MG tablet; Take 1 tablet (5 mg total) by mouth daily as needed for erectile dysfunction. -     PSA  Stage 3b chronic kidney disease ([place])- He is followed closely by nephrology regarding this.  Hyperlipidemia with target LDL less than 100- He has achieved his LDL goal and is doing well on the statin. -     Lipid panel; Future -     Hepatic function panel; Future -     Lipid panel -     Hepatic function panel  Chronic heel pain, left- Based on his symptoms, exam, and plain films I think he is symptomatic from a spur at the insertion of the Achilles on the calcaneus.  I recommended that he see podiatry to consider treatment options.  He will have to avoid NSAIDs due to his renal insufficiency. -     DG Os Calcis Left; Future  Need for shingles vaccine -     Zoster Vaccine Adjuvanted [facility]) injection; Inject 0.5 mLs into the muscle once for 1 dose.  Encounter for general adult medical examination with abnormal findings- Exam completed, labs reviewed, vaccines reviewed, cancer screenings are up-to-date, patient education was given.  Calcaneal spur of foot, left -     Ambulatory referral to Podiatry   I have discontinued Devon Brady's levothyroxine and albuterol. I am also having him start on tadalafil and Shingrix. Additionally, I am having him maintain his aspirin EC, nitroGLYCERIN, amLODipine, rosuvastatin, metoprolol succinate, ezetimibe, dapagliflozin propanediol, and Praluent.  Meds ordered this encounter  Medications  . tadalafil (CIALIS) 5 MG  tablet    Sig: Take 1 tablet (5 mg total) by mouth daily as needed for erectile dysfunction.    Dispense:  90 tablet    Refill:  1  . Zoster Vaccine Adjuvanted [facility]) injection    Sig: Inject 0.5 mLs into the muscle once  for 1 dose.    Dispense:  0.5 mL    Refill:  1   In addition to time spent on CPE, I spent 50 minutes in preparing to see the patient by review of recent labs, imaging and procedures, obtaining and reviewing separately obtained history, communicating with the patient and family or caregiver, ordering medications, tests or procedures, and documenting clinical information in the EHR including the differential Dx, treatment, and any further evaluation and other management of 1. Essential hypertension 2. Hypothyroidism, postsurgical 3. Microalbuminuria due to type 2 diabetes mellitus ([place]) 4. Type II diabetes mellitus with manifestations ([place]) 5. Benign prostatic hyperplasia without lower urinary tract symptoms 6. Stage 3b chronic kidney disease ([place]) 7. Hyperlipidemia with target LDL less than 100 8. Chronic heel pain, left 9. Calcaneal spur of foot, left   Follow-up: Return in about 6 months (around 08/10/2020).  Scarlette Calico, MD

## 2020-02-12 DIAGNOSIS — M7732 Calcaneal spur, left foot: Secondary | ICD-10-CM | POA: Insufficient documentation

## 2020-02-19 ENCOUNTER — Encounter: Payer: Self-pay | Admitting: Internal Medicine

## 2020-03-03 ENCOUNTER — Ambulatory Visit: Payer: Medicare Other | Admitting: Podiatry

## 2020-03-03 ENCOUNTER — Ambulatory Visit (INDEPENDENT_AMBULATORY_CARE_PROVIDER_SITE_OTHER): Payer: Managed Care, Other (non HMO) | Admitting: Podiatry

## 2020-03-03 ENCOUNTER — Other Ambulatory Visit: Payer: Self-pay

## 2020-03-03 DIAGNOSIS — M779 Enthesopathy, unspecified: Secondary | ICD-10-CM

## 2020-03-03 DIAGNOSIS — M79672 Pain in left foot: Secondary | ICD-10-CM | POA: Diagnosis not present

## 2020-03-03 DIAGNOSIS — M7732 Calcaneal spur, left foot: Secondary | ICD-10-CM | POA: Diagnosis not present

## 2020-03-03 DIAGNOSIS — I2510 Atherosclerotic heart disease of native coronary artery without angina pectoris: Secondary | ICD-10-CM

## 2020-03-03 MED ORDER — DICLOFENAC SODIUM 1 % EX GEL: 2.0000 g | Freq: Four times a day (QID) | CUTANEOUS | 2 refills | Status: DC

## 2020-03-03 NOTE — Patient Instructions (Addendum)
If was nice to meet you today. If you have any questions or any further concerns, please feel fee to give me a call. You can call our office at [phone] or please feel fee to send me a message through [place].   -----  For instructions on how to put on your Night Splint, please visit [email or web address]   ----  Achilles Tendinitis  with Rehab Achilles tendinitis is a disorder of the Achilles tendon. The Achilles tendon connects the large calf muscles (Gastrocnemius and Soleus) to the heel bone (calcaneus). This tendon is sometimes called the heel cord. It is important for pushing-off and standing on your toes and is important for walking, running, or jumping. Tendinitis is often caused by overuse and repetitive microtrauma. SYMPTOMS  Pain, tenderness, swelling, warmth, and redness may occur over the Achilles tendon even at rest.  Pain with pushing off, or flexing or extending the ankle.  Pain that is worsened after or during activity. CAUSES   Overuse sometimes seen with rapid increase in exercise programs or in sports requiring running and jumping.  Poor physical conditioning (strength and flexibility or endurance).  Running sports, especially training running down hills.  Inadequate warm-up before practice or play or failure to stretch before participation.  Injury to the tendon. PREVENTION   Warm up and stretch before practice or competition.  Allow time for adequate rest and recovery between practices and competition.  Keep up conditioning.  Keep up ankle and leg flexibility.  Improve or keep muscle strength and endurance.  Improve cardiovascular fitness.  Use proper technique.  Use proper equipment (shoes, skates).  To help prevent recurrence, taping, protective strapping, or an adhesive bandage may be recommended for several weeks after healing is complete. PROGNOSIS   Recovery may take weeks to several months to heal.  Longer recovery is expected  if symptoms have been prolonged.  Recovery is usually quicker if the inflammation is due to a direct blow as compared with overuse or sudden strain. RELATED COMPLICATIONS   Healing time will be prolonged if the condition is not correctly treated. The injury must be given plenty of time to heal.  Symptoms can reoccur if activity is resumed too soon.  Untreated, tendinitis may increase the risk of tendon rupture requiring additional time for recovery and possibly surgery. TREATMENT   The first treatment consists of rest anti-inflammatory medication, and ice to relieve the pain.  Stretching and strengthening exercises after resolution of pain will likely help reduce the risk of recurrence. Referral to a physical therapist or athletic trainer for further evaluation and treatment may be helpful.  A walking boot or cast may be recommended to rest the Achilles tendon. This can help break the cycle of inflammation and microtrauma.  Arch supports (orthotics) may be prescribed or recommended by your caregiver as an adjunct to therapy and rest.  Surgery to remove the inflamed tendon lining or degenerated tendon tissue is rarely necessary and has shown less than predictable results. MEDICATION   Nonsteroidal anti-inflammatory medications, such as aspirin and ibuprofen, may be used for pain and inflammation relief. Do not take within 7 days before surgery. Take these as directed by your caregiver. Contact your caregiver immediately if any bleeding, stomach upset, or signs of allergic reaction occur. Other minor pain relievers, such as acetaminophen, may also be used.  Pain relievers may be prescribed as necessary by your caregiver. Do not take prescription pain medication for longer than 4 to 7 days. Use only as directed and only  as much as you need.  Cortisone injections are rarely indicated. Cortisone injections may weaken tendons and predispose to rupture. It is better to give the condition more  time to heal than to use them. HEAT AND COLD  Cold is used to relieve pain and reduce inflammation for acute and chronic Achilles tendinitis. Cold should be applied for 10 to 15 minutes every 2 to 3 hours for inflammation and pain and immediately after any activity that aggravates your symptoms. Use ice packs or an ice massage.  Heat may be used before performing stretching and strengthening activities prescribed by your caregiver. Use a heat pack or a warm soak. SEEK MEDICAL CARE IF:  Symptoms get worse or do not improve in 2 weeks despite treatment.  New, unexplained symptoms develop. Drugs used in treatment may produce side effects.  EXERCISES:  RANGE OF MOTION (ROM) AND STRETCHING EXERCISES - Achilles Tendinitis  These exercises may help you when beginning to rehabilitate your injury. Your symptoms may resolve with or without further involvement from your physician, physical therapist or athletic trainer. While completing these exercises, remember:   Restoring tissue flexibility helps normal motion to return to the joints. This allows healthier, less painful movement and activity.  An effective stretch should be held for at least 30 seconds.  A stretch should never be painful. You should only feel a gentle lengthening or release in the stretched tissue.  STRETCH  Gastroc, Standing   Place hands on wall.  Extend right / left leg, keeping the front knee somewhat bent.  Slightly point your toes inward on your back foot.  Keeping your right / left heel on the floor and your knee straight, shift your weight toward the wall, not allowing your back to arch.  You should feel a gentle stretch in the right / left calf. Hold this position for 10 seconds. Repeat 3 times. Complete this stretch 2 times per day.  STRETCH  Soleus, Standing   Place hands on wall.  Extend right / left leg, keeping the other knee somewhat bent.  Slightly point your toes inward on your back foot.  Keep  your right / left heel on the floor, bend your back knee, and slightly shift your weight over the back leg so that you feel a gentle stretch deep in your back calf.  Hold this position for 10 seconds. Repeat 3 times. Complete this stretch 2 times per day.  STRETCH  Gastrocsoleus, Standing  Note: This exercise can place a lot of stress on your foot and ankle. Please complete this exercise only if specifically instructed by your caregiver.   Place the ball of your right / left foot on a step, keeping your other foot firmly on the same step.  Hold on to the wall or a rail for balance.  Slowly lift your other foot, allowing your body weight to press your heel down over the edge of the step.  You should feel a stretch in your right / left calf.  Hold this position for 10 seconds.  Repeat this exercise with a slight bend in your knee. Repeat 3 times. Complete this stretch 2 times per day.   STRENGTHENING EXERCISES - Achilles Tendinitis These exercises may help you when beginning to rehabilitate your injury. They may resolve your symptoms with or without further involvement from your physician, physical therapist or athletic trainer. While completing these exercises, remember:   Muscles can gain both the endurance and the strength needed for everyday activities through controlled  exercises.  Complete these exercises as instructed by your physician, physical therapist or athletic trainer. Progress the resistance and repetitions only as guided.  You may experience muscle soreness or fatigue, but the pain or discomfort you are trying to eliminate should never worsen during these exercises. If this pain does worsen, stop and make certain you are following the directions exactly. If the pain is still present after adjustments, discontinue the exercise until you can discuss the trouble with your clinician.  STRENGTH - Plantar-flexors   Sit with your right / left leg extended. Holding onto both  ends of a rubber exercise band/tubing, loop it around the ball of your foot. Keep a slight tension in the band.  Slowly push your toes away from you, pointing them downward.  Hold this position for 10 seconds. Return slowly, controlling the tension in the band/tubing. Repeat 3 times. Complete this exercise 2 times per day.   STRENGTH - Plantar-flexors   Stand with your feet shoulder width apart. Steady yourself with a wall or table using as little support as needed.  Keeping your weight evenly spread over the width of your feet, rise up on your toes.*  Hold this position for 10 seconds. Repeat 3 times. Complete this exercise 2 times per day.  *If this is too easy, shift your weight toward your right / left leg until you feel challenged. Ultimately, you may be asked to do this exercise with your right / left foot only.  STRENGTH  Plantar-flexors, Eccentric  Note: This exercise can place a lot of stress on your foot and ankle. Please complete this exercise only if specifically instructed by your caregiver.   Place the balls of your feet on a step. With your hands, use only enough support from a wall or rail to keep your balance.  Keep your knees straight and rise up on your toes.  Slowly shift your weight entirely to your right / left toes and pick up your opposite foot. Gently and with controlled movement, lower your weight through your right / left foot so that your heel drops below the level of the step. You will feel a slight stretch in the back of your calf at the end position.  Use the healthy leg to help rise up onto the balls of both feet, then lower weight only on the right / left leg again. Build up to 15 repetitions. Then progress to 3 consecutive sets of 15 repetitions.*  After completing the above exercise, complete the same exercise with a slight knee bend (about 30 degrees). Again, build up to 15 repetitions. Then progress to 3 consecutive sets of 15 repetitions.* Perform  this exercise 2 times per day.  *When you easily complete 3 sets of 15, your physician, physical therapist or athletic trainer may advise you to add resistance by wearing a backpack filled with additional weight.  STRENGTH - Plantar Flexors, Seated   Sit on a chair that allows your feet to rest flat on the ground. If necessary, sit at the edge of the chair.  Keeping your toes firmly on the ground, lift your right / left heel as far as you can without increasing any discomfort in your ankle. Repeat 3 times. Complete this exercise 2 times a day.

## 2020-03-09 NOTE — Progress Notes (Signed)
Subjective:   Patient ID: Devon Brady, male   DOB: 69 y.o.   MRN: [id number]   HPI 69 year old male presents the office with concerns of left heel pain which has been ongoing for last 1 year. He denies any numbness or tingling or any burning or any radiating pain. He said no recent injury. No recent treatment. Hurts worse than when he first gets up and activity makes it feel better. He does have history of bunionectomy in the same foot 1979. No other concerns today.  Last A1c 6.1 on 02/11/2020   Review of Systems  All other systems reviewed and are negative.  Past Medical History:  Diagnosis Date  . CAD (coronary artery disease)   . H/O colonoscopy 2013  . H/O hiatal hernia   . Hemorrhoid   . History of colon polyps   . Hodgkin's lymphoma ([place])   . Hydronephrosis   . Hyperlipidemia   . Hypertension   . Hypothyroidism   . Myocardial infarction [facility]) 2011   "mild one, before the CABG"  . 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 Nocturia   . 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 Renal insufficiency   . Sleep apnea    No CPAP  . TIA (transient ischemic attack) 2011    Past Surgical History:  Procedure Laterality Date  . BONE MARROW ASPIRATE,BIOPSY, AND CLOT  06/11/11   LEFT ILIAC CREAST  . BUNIONECTOMY Right 1978   foot  . CARDIAC CATHETERIZATION  10/15/2009  . COLONOSCOPY    . CORONARY ARTERY BYPASS GRAFT  10/17/2009   LIMA to LAD,SVG to Ramus,SVG to OM sequential to OM2, SVG to marginal of RCA  . ESOPHAGOGASTRODUODENOSCOPY    . LEFT HEART CATHETERIZATION WITH CORONARY/GRAFT ANGIOGRAM N/A 10/03/2013   Procedure: LEFT HEART CATHETERIZATION WITH Beatrix Fetters;  Surgeon: Sinclair Grooms, MD;  Location: [facility] CATH LAB;  Service: Cardiovascular;  Laterality: N/A;  . LYMPH NODE BIOPSY  06/07/11   RIGHT INGUINAL NODE: CLASSICAL HODGKIN'S LYMPHOMA, NODULAR SCLEROSIS TYPE  . PORT-A-CATH REMOVAL Left 12/06/2013    Procedure: REMOVAL PORT-A-CATH;  Surgeon: [name], MD;  Location: [place];  Service: General;  Laterality: Left;  . PORTACATH PLACEMENT  06/10/2011   Procedure: INSERTION PORT-A-CATH;  Surgeon: [name], MD;  Location: [facility];  Service: General;  Laterality: Left;  subclavian   . THYROIDECTOMY  03/31/2012   Procedure: THYROIDECTOMY;  Surgeon: Izora Gala, MD;  Location: [place];  Service: ENT;  Laterality: N/A;  . TONSILLECTOMY     as a child  . VASECTOMY  1980     Current Outpatient Medications:  .  amLODipine (NORVASC) 5 MG tablet, TAKE 1 TABLET BY MOUTH EVERY DAY, Disp: 90 tablet, Rfl: 3 .  aspirin EC 81 MG tablet, Take 81 mg by mouth every evening. , Disp: , Rfl:  .  dapagliflozin propanediol (FARXIGA) 10 MG TABS tablet, Take 1 tablet (10 mg total) by mouth daily before breakfast., Disp: 90 tablet, Rfl: 1 .  diclofenac Sodium (VOLTAREN) 1 % GEL, Apply 2 g topically 4 (four) times daily. Rub into affected area of foot 2 to 4 times daily, Disp: 100 g, Rfl: 2 .  ezetimibe (ZETIA) 10 MG tablet, Take 1 tablet (10 mg total) by mouth daily., Disp: 90 tablet, Rfl: 2 .  metoprolol succinate (TOPROL-XL) 50 MG 24 hr tablet, TAKE 1 TABLET BY MOUTH EVERY DAY, Disp: 90 tablet, Rfl:  3 .  nitroGLYCERIN (NITROSTAT) 0.4 MG SL tablet, PLACE 1 TABLET UNDER THE TONGUE EVERY 5 MINUTES AS NEEDED FOR CHEST PAIN, Disp: 25 tablet, Rfl: 6 .  PRALUENT 75 MG/ML SOAJ, INJECT 1 PEN INTO THE SKIN EVERY 14 DAYS, Disp: 2 mL, Rfl: 11 .  rosuvastatin (CRESTOR) 10 MG tablet, TAKE 1 TABLET BY MOUTH 3 TIMES A WEEK AS TOLERATED. PLEASE MAKE ANNUAL APPT WITH DR FOR MORE REFILLS, Disp: 45 tablet, Rfl: 3 .  tadalafil (CIALIS) 5 MG tablet, Take 1 tablet (5 mg total) by mouth daily as needed for erectile dysfunction., Disp: 90 tablet, Rfl: 1  Allergies  Allergen Reactions  . Crestor [Rosuvastatin]     Muscle aches  . Cialis [Tadalafil]     headache         Objective:  Physical Exam  General: AAO  x3, NAD  Dermatological: Skin is warm, dry and supple bilateral.  There are no open sores, no preulcerative lesions, no rash or signs of infection present.  Vascular: Dorsalis Pedis artery and Posterior Tibial artery pedal pulses are 2/4 bilateral with immedate capillary fill time.  There is no pain with calf compression, swelling, warmth, erythema.   Neruologic: Grossly intact via light touch bilateral. Negative tinel sign.   Musculoskeletal: Mild tenderness palpation of posterior aspect the left heel along the area of a prominent bone spur which is palpable. Equinus is evident. No pain with Achilles tendon otherwise and Thompson test is negative. There is no pain with lateral compression of calcaneus. No pain with plantar calcaneus. Muscular strength 5/5 in all groups tested bilateral.  Gait: Unassisted, Nonantalgic.       Assessment:   Heel spur, insertional Achilles tendonitis    Plan:  -Treatment options discussed including all alternatives, risks, and complications -Etiology of symptoms were discussed -Independently reviewed the x-rays that he had done previously. -Discussed both conservative as well as surgical treatment options. -Discussed stretching, icing daily. Dispensed night splint. Heel lift. Discussed shoe modifications and orthotics. Voltaren gel.     Trula Slade DPM

## 2020-04-13 ENCOUNTER — Other Ambulatory Visit: Payer: Self-pay | Admitting: Internal Medicine

## 2020-04-13 DIAGNOSIS — E118 Type 2 diabetes mellitus with unspecified complications: Secondary | ICD-10-CM

## 2020-04-14 ENCOUNTER — Ambulatory Visit (INDEPENDENT_AMBULATORY_CARE_PROVIDER_SITE_OTHER): Payer: Managed Care, Other (non HMO) | Admitting: Podiatry

## 2020-04-14 ENCOUNTER — Other Ambulatory Visit: Payer: Self-pay

## 2020-04-14 DIAGNOSIS — M79672 Pain in left foot: Secondary | ICD-10-CM

## 2020-04-14 DIAGNOSIS — M779 Enthesopathy, unspecified: Secondary | ICD-10-CM | POA: Diagnosis not present

## 2020-04-14 DIAGNOSIS — M7732 Calcaneal spur, left foot: Secondary | ICD-10-CM | POA: Diagnosis not present

## 2020-04-14 NOTE — Progress Notes (Signed)
Subjective: 70 year old male presents the office today for evaluation of posterior left heel pain, insertional Achilles tendinitis.  He states that he is doing better and the pain is improved.  He stopped stretching, icing as well as the Voltaren gel because he was doing better.  No recent injury or changes otherwise. Denies any systemic complaints such as fevers, chills, nausea, vomiting. No acute changes since last appointment, and no other complaints at this time.   Objective: AAO x3, NAD DP/PT pulses palpable bilaterally, CRT less than 3 seconds There is mild discomfort on the posterior aspect calcaneus on area of a palpable bone spur.  Achilles tendon appears intact.  No pain with lateral compression of calcaneus.  No edema, erythema.  MMT 5/5.  No pain with calf compression, swelling, warmth, erythema  Assessment: Posterior calcaneal spur insertional Achilles cellulitis with improvement  Plan: -All treatment options discussed with the patient including all alternatives, risks, complications.  -I recommended to get back to doing the stretching, icing daily.  He can use the Voltaren gel as needed.  Heel lift dispensed.  Discussed doing a Medrol Dosepak but since he is doing better metabolic on this and will consider this in the future if needed. -Patient encouraged to call the office with any questions, concerns, change in symptoms.   Trula Slade DPM

## 2020-04-14 NOTE — Patient Instructions (Signed)

## 2020-05-26 ENCOUNTER — Ambulatory Visit: Payer: Managed Care, Other (non HMO)

## 2020-06-09 ENCOUNTER — Other Ambulatory Visit: Payer: Self-pay

## 2020-06-09 ENCOUNTER — Ambulatory Visit (INDEPENDENT_AMBULATORY_CARE_PROVIDER_SITE_OTHER): Payer: Managed Care, Other (non HMO)

## 2020-06-09 VITALS — BP 138/70 | HR 68 | Temp 97.9°F | Ht 71.0 in | Wt 235.2 lb

## 2020-06-09 DIAGNOSIS — Z0000 Encounter for general adult medical examination without abnormal findings: Secondary | ICD-10-CM

## 2020-06-09 NOTE — Patient Instructions (Signed)
Devon Brady , Thank you for taking time to come for your Medicare Wellness Visit. I appreciate your ongoing commitment to your health goals. Please review the following plan we discussed and let me know if I can assist you in the future.   Screening recommendations/referrals: Colonoscopy: 02/20/2016; due every 10 years Recommended yearly ophthalmology/optometry visit for glaucoma screening and checkup Recommended yearly dental visit for hygiene and checkup  Vaccinations: Influenza vaccine: 01/07/2020 Pneumococcal vaccine: 02/13/2014, 04/25/2017 Tdap vaccine: 03/14/2012; due every 10 years Shingles vaccine: never done; can check with local pharmacy    Covid-19: 03/13/2019, 04/10/2019, 12/04/2019  Advanced directives: Please bring a copy of your health care power of attorney and living will to the office at your convenience.  Conditions/risks identified: Yes; Reviewed health maintenance screenings with patient today and relevant education, vaccines, and/or referrals were provided. Please continue to do your personal lifestyle choices by: daily care of teeth and gums, regular physical activity (goal should be 5 days a week for 30 minutes), eat a healthy diet, avoid tobacco and drug use, limiting any alcohol intake, taking a low-dose aspirin (if not allergic or have been advised by your provider otherwise) and taking vitamins and minerals as recommended by your provider. Continue doing brain stimulating activities (puzzles, reading, adult coloring books, staying active) to keep memory sharp. Continue to eat heart healthy diet (full of fruits, vegetables, whole grains, lean protein, water--limit salt, fat, and sugar intake) and increase physical activity as tolerated.  Next appointment: Please schedule your next Medicare Wellness Visit with your Nurse Health Advisor in 1 year by calling [phone].  Preventive Care 96 Years and Older, Male Preventive care refers to lifestyle choices and visits with  your health care provider that can promote health and wellness. What does preventive care include?  A yearly physical exam. This is also called an annual well check.  Dental exams once or twice a year.  Routine eye exams. Ask your health care provider how often you should have your eyes checked.  Personal lifestyle choices, including:  Daily care of your teeth and gums.  Regular physical activity.  Eating a healthy diet.  Avoiding tobacco and drug use.  Limiting alcohol use.  Practicing safe sex.  Taking low doses of aspirin every day.  Taking vitamin and mineral supplements as recommended by your health care provider. What happens during an annual well check? The services and screenings done by your health care provider during your annual well check will depend on your age, overall health, lifestyle risk factors, and family history of disease. Counseling  Your health care provider may ask you questions about your:  Alcohol use.  Tobacco use.  Drug use.  Emotional well-being.  Home and relationship well-being.  Sexual activity.  Eating habits.  History of falls.  Memory and ability to understand (cognition).  Work and work Statistician. Screening  You may have the following tests or measurements:  Height, weight, and BMI.  Blood pressure.  Lipid and cholesterol levels. These may be checked every 5 years, or more frequently if you are over 52 years old.  Skin check.  Lung cancer screening. You may have this screening every year starting at age 22 if you have a 30-pack-year history of smoking and currently smoke or have quit within the past 15 years.  Fecal occult blood test (FOBT) of the stool. You may have this test every year starting at age 76.  Flexible sigmoidoscopy or colonoscopy. You may have a sigmoidoscopy every 5 years or  a colonoscopy every 10 years starting at age 63.  Prostate cancer screening. Recommendations will vary depending on your  family history and other risks.  Hepatitis C blood test.  Hepatitis B blood test.  Sexually transmitted disease (STD) testing.  Diabetes screening. This is done by checking your blood sugar (glucose) after you have not eaten for a while (fasting). You may have this done every 1-3 years.  Abdominal aortic aneurysm (AAA) screening. You may need this if you are a current or former smoker.  Osteoporosis. You may be screened starting at age 103 if you are at high risk. Talk with your health care provider about your test results, treatment options, and if necessary, the need for more tests. Vaccines  Your health care provider may recommend certain vaccines, such as:  Influenza vaccine. This is recommended every year.  Tetanus, diphtheria, and acellular pertussis (Tdap, Td) vaccine. You may need a Td booster every 10 years.  Zoster vaccine. You may need this after age 65.  Pneumococcal 13-valent conjugate (PCV13) vaccine. One dose is recommended after age 20.  Pneumococcal polysaccharide (PPSV23) vaccine. One dose is recommended after age 75. Talk to your health care provider about which screenings and vaccines you need and how often you need them. This information is not intended to replace advice given to you by your health care provider. Make sure you discuss any questions you have with your health care provider. Document Released: 03/28/2015 Document Revised: 11/19/2015 Document Reviewed: 12/31/2014 Elsevier Interactive Patient Education  2017 [place] Prevention in the Home Falls can cause injuries. They can happen to people of all ages. There are many things you can do to make your home safe and to help prevent falls. What can I do on the outside of my home?  Regularly fix the edges of walkways and driveways and fix any cracks.  Remove anything that might make you trip as you walk through a door, such as a raised step or threshold.  Trim any bushes or trees on the  path to your home.  Use bright outdoor lighting.  Clear any walking paths of anything that might make someone trip, such as rocks or tools.  Regularly check to see if handrails are loose or broken. Make sure that both sides of any steps have handrails.  Any raised decks and porches should have guardrails on the edges.  Have any leaves, snow, or ice cleared regularly.  Use sand or salt on walking paths during winter.  Clean up any spills in your garage right away. This includes oil or grease spills. What can I do in the bathroom?  Use night lights.  Install grab bars by the toilet and in the tub and shower. Do not use towel bars as grab bars.  Use non-skid mats or decals in the tub or shower.  If you need to sit down in the shower, use a plastic, non-slip stool.  Keep the floor dry. Clean up any water that spills on the floor as soon as it happens.  Remove soap buildup in the tub or shower regularly.  Attach bath mats securely with double-sided non-slip rug tape.  Do not have throw rugs and other things on the floor that can make you trip. What can I do in the bedroom?  Use night lights.  Make sure that you have a light by your bed that is easy to reach.  Do not use any sheets or blankets that are too big for your bed.  They should not hang down onto the floor.  Have a firm chair that has side arms. You can use this for support while you get dressed.  Do not have throw rugs and other things on the floor that can make you trip. What can I do in the kitchen?  Clean up any spills right away.  Avoid walking on wet floors.  Keep items that you use a lot in easy-to-reach places.  If you need to reach something above you, use a strong step stool that has a grab bar.  Keep electrical cords out of the way.  Do not use floor polish or wax that makes floors slippery. If you must use wax, use non-skid floor wax.  Do not have throw rugs and other things on the floor that can  make you trip. What can I do with my stairs?  Do not leave any items on the stairs.  Make sure that there are handrails on both sides of the stairs and use them. Fix handrails that are broken or loose. Make sure that handrails are as long as the stairways.  Check any carpeting to make sure that it is firmly attached to the stairs. Fix any carpet that is loose or worn.  Avoid having throw rugs at the top or bottom of the stairs. If you do have throw rugs, attach them to the floor with carpet tape.  Make sure that you have a light switch at the top of the stairs and the bottom of the stairs. If you do not have them, ask someone to add them for you. What else can I do to help prevent falls?  Wear shoes that:  Do not have high heels.  Have rubber bottoms.  Are comfortable and fit you well.  Are closed at the toe. Do not wear sandals.  If you use a stepladder:  Make sure that it is fully opened. Do not climb a closed stepladder.  Make sure that both sides of the stepladder are locked into place.  Ask someone to hold it for you, if possible.  Clearly mark and make sure that you can see:  Any grab bars or handrails.  First and last steps.  Where the edge of each step is.  Use tools that help you move around (mobility aids) if they are needed. These include:  Canes.  Walkers.  Scooters.  Crutches.  Turn on the lights when you go into a dark area. Replace any light bulbs as soon as they burn out.  Set up your furniture so you have a clear path. Avoid moving your furniture around.  If any of your floors are uneven, fix them.  If there are any pets around you, be aware of where they are.  Review your medicines with your doctor. Some medicines can make you feel dizzy. This can increase your chance of falling. Ask your doctor what other things that you can do to help prevent falls. This information is not intended to replace advice given to you by your health care  provider. Make sure you discuss any questions you have with your health care provider. Document Released: 12/26/2008 Document Revised: 08/07/2015 Document Reviewed: 04/05/2014 Elsevier Interactive Patient Education  2017 [employer].

## 2020-06-09 NOTE — Progress Notes (Signed)
Subjective:   Devon Brady is a 70 y.o. male who presents for Medicare Annual/Subsequent preventive examination.  Review of Systems    No ROS. Medicare Wellness Visit. Additional risk factors are reflected in social history. Cardiac Risk Factors include: advanced age (>4men, >19 women);dyslipidemia;family history of premature cardiovascular disease;hypertension;male gender;obesity (BMI >30kg/m2)     Objective:    Today's Vitals   06/09/20 1244  BP: 138/70  Pulse: 68  Temp: 97.9 F (36.6 C)  SpO2: 97%  Weight: 235 lb 3.2 oz (106.7 kg)  Height: 5\' 11"  (1.803 m)  PainSc: 0-No pain   Body mass index is 32.8 kg/m.  Advanced Directives 06/09/2020 10/31/2015 11/06/2014 12/03/2013 10/03/2013 10/02/2013 03/31/2012  Does Patient Have a Medical Advance Directive? Yes Yes Yes Yes Patient has advance directive, copy in chart Patient does not have advance directive;Patient would like information Patient has advance directive, copy not in chart  Type of Advance Directive Living will;Healthcare Power of [place];Living will [place];Living will Living will [place];Living will - Living will  Does patient want to make changes to medical advance directive? No - Patient declined Yes - information given - - - - -  Copy of [place] in Chart? No - copy requested No - copy requested No - copy requested No - copy requested - - Copy requested from family  Would patient like information on creating a medical advance directive? - - - - - Advance directive packet given -  Pre-existing out of facility DNR order (yellow form or pink MOST form) - - - - - No No    Current Medications (verified) Outpatient Encounter Medications as of 06/09/2020  Medication Sig  . amLODipine (NORVASC) 5 MG tablet TAKE 1 TABLET BY MOUTH EVERY DAY  . aspirin EC 81 MG tablet Take 81 mg by mouth every evening.   . diclofenac Sodium  (VOLTAREN) 1 % GEL Apply 2 g topically 4 (four) times daily. Rub into affected area of foot 2 to 4 times daily  . ezetimibe (ZETIA) 10 MG tablet Take 1 tablet (10 mg total) by mouth daily.  Marland Kitchen FARXIGA 10 MG TABS tablet TAKE 1 TABLET (10 MG TOTAL) BY MOUTH DAILY BEFORE BREAKFAST.  . metoprolol succinate (TOPROL-XL) 50 MG 24 hr tablet TAKE 1 TABLET BY MOUTH EVERY DAY  . nitroGLYCERIN (NITROSTAT) 0.4 MG SL tablet PLACE 1 TABLET UNDER THE TONGUE EVERY 5 MINUTES AS NEEDED FOR CHEST PAIN  . PRALUENT 75 MG/ML SOAJ INJECT 1 PEN INTO THE SKIN EVERY 14 DAYS  . rosuvastatin (CRESTOR) 10 MG tablet TAKE 1 TABLET BY MOUTH 3 TIMES A WEEK AS TOLERATED. PLEASE MAKE ANNUAL APPT WITH DR FOR MORE REFILLS  . tadalafil (CIALIS) 5 MG tablet Take 1 tablet (5 mg total) by mouth daily as needed for erectile dysfunction.  . [DISCONTINUED] citalopram (CELEXA) 20 MG tablet Take 1 tablet (20 mg total) by mouth daily.  . [DISCONTINUED] omeprazole (PRILOSEC OTC) 20 MG tablet Take 1 tablet (20 mg total) by mouth daily.   No facility-administered encounter medications on file as of 06/09/2020.    Allergies (verified) Crestor [rosuvastatin] and Cialis [tadalafil]   History: Past Medical History:  Diagnosis Date  . CAD (coronary artery disease)   . H/O colonoscopy 2013  . H/O hiatal hernia   . Hemorrhoid   . History of colon polyps   . Hodgkin's lymphoma ([place])   . Hydronephrosis   . Hyperlipidemia   .  Hypertension   . Hypothyroidism   . Myocardial infarction [facility]) 2011   "mild one, before the CABG"  . 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 Nocturia   . 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 Renal insufficiency   . Sleep apnea    No CPAP  . TIA (transient ischemic attack) 2011   Past Surgical History:  Procedure Laterality Date  . BONE MARROW ASPIRATE,BIOPSY, AND CLOT  06/11/11   LEFT ILIAC CREAST  . BUNIONECTOMY Right 1978   foot  . CARDIAC CATHETERIZATION   10/15/2009  . COLONOSCOPY    . CORONARY ARTERY BYPASS GRAFT  10/17/2009   LIMA to LAD,SVG to Ramus,SVG to OM sequential to OM2, SVG to marginal of RCA  . ESOPHAGOGASTRODUODENOSCOPY    . LEFT HEART CATHETERIZATION WITH CORONARY/GRAFT ANGIOGRAM N/A 10/03/2013   Procedure: LEFT HEART CATHETERIZATION WITH Beatrix Fetters;  Surgeon: Sinclair Grooms, MD;  Location: [facility] CATH LAB;  Service: Cardiovascular;  Laterality: N/A;  . LYMPH NODE BIOPSY  06/07/11   RIGHT INGUINAL NODE: CLASSICAL HODGKIN'S LYMPHOMA, NODULAR SCLEROSIS TYPE  . PORT-A-CATH REMOVAL Left 12/06/2013   Procedure: REMOVAL PORT-A-CATH;  Surgeon: [name], MD;  Location: [facility];  Service: General;  Laterality: Left;  . PORTACATH PLACEMENT  06/10/2011   Procedure: INSERTION PORT-A-CATH;  Surgeon: [name], MD;  Location: [facility];  Service: General;  Laterality: Left;  subclavian   . THYROIDECTOMY  03/31/2012   Procedure: THYROIDECTOMY;  Surgeon: Izora Gala, MD;  Location: [facility];  Service: ENT;  Laterality: N/A;  . TONSILLECTOMY     as a child  . VASECTOMY  1980   Family History  Problem Relation Age of Onset  . Arthritis Mother   . Hypertension Mother   . Diabetes Father   . Coronary artery disease Father 33       Died age 43  . Kidney disease Father   . Hypertension Sister   . Hypertension Sister        2nd Sister  . Alcohol abuse Brother   . Prostate cancer Paternal Uncle        Great  . Cancer Neg Hx   . Drug abuse Neg Hx   . Early death Neg Hx   . Stroke Neg Hx    Social History   Socioeconomic History  . Marital status: Married    Spouse name: Not on file  . Number of children: 2  . Years of education: Not on file  . Highest education level: Not on file  Occupational History  . 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   Tobacco Use  . Smoking status: Never Smoker  . Smokeless tobacco: Never Used  Substance and Sexual  Activity  . Alcohol use: Yes    Comment: 10/02/2013 "couple beers; couple times/month"  . Drug use: No  . Sexual activity: Never  Other Topics Concern  . Not on file  Social History Narrative  . Not on file   Social Determinants of Health   Financial Resource Strain: Low Risk   . Difficulty of Paying Living Expenses: Not hard at all  Food Insecurity: No Food Insecurity  . Worried About Charity fundraiser in the Last Year: Never true  . Ran Out of Food in the Last Year: Never true  Transportation Needs: No Transportation Needs  . Lack of  Transportation (Medical): No  . Lack of Transportation (Non-Medical): No  Physical Activity: Sufficiently Active  . Days of Exercise per Week: 5 days  . Minutes of Exercise per Session: 60 min  Stress: No Stress Concern Present  . Feeling of Stress : Not at all  Social Connections: Socially Integrated  . Frequency of Communication with Friends and Family: More than three times a week  . Frequency of Social Gatherings with Friends and Family: Once a week  . Attends Religious Services: More than 4 times per year  . Active Member of Clubs or Organizations: No  . Attends Archivist Meetings: More than 4 times per year  . Marital Status: Married    Tobacco Counseling Counseling given: Not Answered   Clinical Intake:  Pre-visit preparation completed: Yes  Pain : No/denies pain Pain Score: 0-No pain     BMI - recorded: 32.8 Nutritional Status: BMI > 30  Obese Nutritional Risks: None Diabetes: Yes CBG done?: No Did pt. bring in CBG monitor from home?: No  How often do you need to have someone help you when you read instructions, pamphlets, or other written materials from your doctor or pharmacy?: 1 - Never What is the last grade level you completed in school?: Bachelor's Degree in Engineering ([employer])  Diabetic? yes  Interpreter Needed?: No  Information entered by :: Lisette Abu, LPN   Activities of  Daily Living In your present state of health, do you have any difficulty performing the following activities: 06/09/2020 02/11/2020  Hearing? N N  Vision? N N  Difficulty concentrating or making decisions? N -  Walking or climbing stairs? N N  Dressing or bathing? N N  Doing errands, shopping? N N  Preparing Food and eating ? N -  Using the Toilet? N -  In the past six months, have you accidently leaked urine? N -  Do you have problems with loss of bowel control? N -  Managing your Medications? N -  Managing your Finances? N -  Housekeeping or managing your Housekeeping? N -  Some recent data might be hidden    Patient Care Team: Janith Lima, MD as PCP - General (Internal Medicine) Burnell Blanks, MD as PCP - Cardiology (Cardiology) Corliss Parish, MD as Consulting Physician (Nephrology) Jacelyn Pi, MD as Consulting Physician (Endocrinology) Izora Gala, MD as Consulting Physician (Otolaryngology) Clarene Essex, MD as Consulting Physician (Gastroenterology) Heath Lark, MD as Consulting Physician (Hematology and Oncology)  Indicate any recent Medical Services you may have received from other than [facility] providers in the past year (date may be approximate).     Assessment:   This is a routine wellness examination for Zackeriah.  Hearing/Vision screen No exam data present  Dietary issues and exercise activities discussed: Current Exercise Habits: The patient has a physically strenuous job, but has no regular exercise apart from work., Exercise limited by: cardiac condition(s);orthopedic condition(s)  Goals    .   Diabetes Patient stated goal (pt-stated)      I would like to work on my diet by drinking more water, exercising more and eating more lean meats and vegetables.      Depression Screen PHQ 2/9 Scores 06/09/2020 02/11/2020 02/01/2019 01/31/2018 01/27/2017 06/14/2016 12/31/2014  PHQ - 2 Score 0 0 0 0 0 0 0    Fall Risk Fall Risk  06/09/2020  02/11/2020 02/01/2019 01/31/2018 01/30/2018  Falls in the past year? 0 0 0 0 0  Number falls in past yr:  0 - 0 - 0  Injury with Fall? 0 - 0 - 0  Risk for fall due to : No Fall Risks - - - -  Follow up Falls evaluation completed - Falls evaluation completed - Falls evaluation completed    [place]:  Any stairs in or around the home? Yes  If so, are there any without handrails? No  Home free of loose throw rugs in walkways, pet beds, electrical cords, etc? Yes  Adequate lighting in your home to reduce risk of falls? Yes   ASSISTIVE DEVICES UTILIZED TO PREVENT FALLS:  Life alert? No  Use of a cane, walker or w/c? No  Grab bars in the bathroom? No  Shower chair or bench in shower? No  Elevated toilet seat or a handicapped toilet? Yes   TIMED UP AND GO:  Was the test performed? No .  Length of time to ambulate 10 feet: 0 sec.   Gait steady and fast without use of assistive device  Cognitive Function: Normal cognitive status assessed by direct observation by this Nurse Health Advisor. No abnormalities found.          Immunizations Immunization History  Administered Date(s) Administered  . DTaP 10/11/2008  . Influenza Split 12/13/2011, 12/31/2019  . Influenza, High Dose Seasonal PF 12/07/2018  . Influenza,inj,Quad PF,6+ Mos 11/22/2012  . Influenza-Unspecified 12/12/2015, 12/13/2016, 01/06/2018  . Moderna Sars-Covid-2 Vaccination 03/13/2019, 04/10/2019, 12/04/2019  . Pneumococcal Conjugate-13 02/13/2014  . Pneumococcal Polysaccharide-23 12/13/2011, 04/25/2017  . Tdap 03/14/2012  . Zoster 01/09/2014    TDAP status: Up to date  Flu Vaccine status: Up to date  Pneumococcal vaccine status: Up to date  Covid-19 vaccine status: Completed vaccines  Qualifies for Shingles Vaccine? Yes   Zostavax completed Yes   Shingrix Completed?: No.    Education has been provided regarding the importance of this vaccine. Patient has been advised to  call insurance company to determine out of pocket expense if they have not yet received this vaccine. Advised may also receive vaccine at local pharmacy or Health Dept. Verbalized acceptance and understanding.  Screening Tests Health Maintenance  Topic Date Due  . URINE MICROALBUMIN  02/01/2020  . COVID-19 Vaccine (4 - Booster for Moderna series) 06/02/2020  . HEMOGLOBIN A1C  08/10/2020  . OPHTHALMOLOGY EXAM  01/14/2021  . FOOT EXAM  02/10/2021  . TETANUS/TDAP  03/14/2022  . COLONOSCOPY (Pts 45-1yrs Insurance coverage will need to be confirmed)  02/19/2026  . INFLUENZA VACCINE  Completed  . Hepatitis C Screening  Completed  . PNA vac Low Risk Adult  Completed  . HPV VACCINES  Aged Out    Health Maintenance  Health Maintenance Due  Topic Date Due  . URINE MICROALBUMIN  02/01/2020  . COVID-19 Vaccine (4 - Booster for Moderna series) 06/02/2020    Colorectal cancer screening: Type of screening: Colonoscopy. Completed 02/20/2016. Repeat every 10 years  Lung Cancer Screening: (Low Dose CT Chest recommended if Age 68-80 years, 30 pack-year currently smoking OR have quit w/in 15years.) does not qualify.   Lung Cancer Screening Referral: no  Additional Screening:  Hepatitis C Screening: does qualify; Completed yes  Vision Screening: Recommended annual ophthalmology exams for early detection of glaucoma and other disorders of the eye. Is the patient up to date with their annual eye exam?  Yes  Who is the provider or what is the name of the office in which the patient attends annual eye exams? Tempie Hoist, MD. If pt  is not established with a provider, would they like to be referred to a provider to establish care? No .   Dental Screening: Recommended annual dental exams for proper oral hygiene  Community Resource Referral / Chronic Care Management: CRR required this visit?  No   CCM required this visit?  No      Plan:     I have personally reviewed and noted the following  in the patient's chart:   . Medical and social history . Use of alcohol, tobacco or illicit drugs  . Current medications and supplements . Functional ability and status . Nutritional status . Physical activity . Advanced directives . List of other physicians . Hospitalizations, surgeries, and ER visits in previous 12 months . Vitals . Screenings to include cognitive, depression, and falls . Referrals and appointments  In addition, I have reviewed and discussed with patient certain preventive protocols, quality metrics, and best practice recommendations. A written personalized care plan for preventive services as well as general preventive health recommendations were provided to patient.     Sheral Flow, LPN   [id number]   Nurse Notes:  Medications reviewed with patient; no opioid use noted.

## 2020-07-01 ENCOUNTER — Other Ambulatory Visit: Payer: Self-pay | Admitting: Cardiovascular Disease

## 2020-07-01 ENCOUNTER — Other Ambulatory Visit: Payer: Self-pay | Admitting: Physician Assistant

## 2020-08-04 ENCOUNTER — Ambulatory Visit: Payer: Managed Care, Other (non HMO) | Admitting: Internal Medicine

## 2020-08-04 ENCOUNTER — Encounter: Payer: Self-pay | Admitting: Internal Medicine

## 2020-08-04 ENCOUNTER — Other Ambulatory Visit: Payer: Self-pay

## 2020-08-04 VITALS — BP 124/74 | HR 68 | Temp 98.0°F | Ht 71.0 in | Wt 231.0 lb

## 2020-08-04 DIAGNOSIS — R809 Proteinuria, unspecified: Secondary | ICD-10-CM

## 2020-08-04 DIAGNOSIS — I10 Essential (primary) hypertension: Secondary | ICD-10-CM

## 2020-08-04 DIAGNOSIS — E1129 Type 2 diabetes mellitus with other diabetic kidney complication: Secondary | ICD-10-CM

## 2020-08-04 DIAGNOSIS — E890 Postprocedural hypothyroidism: Secondary | ICD-10-CM | POA: Diagnosis not present

## 2020-08-04 DIAGNOSIS — E118 Type 2 diabetes mellitus with unspecified complications: Secondary | ICD-10-CM | POA: Diagnosis not present

## 2020-08-04 DIAGNOSIS — N1832 Chronic kidney disease, stage 3b: Secondary | ICD-10-CM | POA: Diagnosis not present

## 2020-08-04 LAB — BASIC METABOLIC PANEL
BUN: 17 mg/dL (ref 6–23)
CO2: 27 mEq/L (ref 19–32)
Calcium: 9.8 mg/dL (ref 8.4–10.5)
Chloride: 108 mEq/L (ref 96–112)
Creatinine, Ser: 1.69 mg/dL — ABNORMAL HIGH (ref 0.40–1.50)
GFR: 41.12 mL/min — ABNORMAL LOW (ref >60.00)
Glucose, Bld: 102 mg/dL — ABNORMAL HIGH (ref 70–99)
Potassium: 4.2 mEq/L (ref 3.5–5.1)
Sodium: 143 mEq/L (ref 135–145)

## 2020-08-04 LAB — URINALYSIS, ROUTINE W REFLEX MICROSCOPIC
Bilirubin Urine: NEGATIVE
Hgb urine dipstick: NEGATIVE
Ketones, ur: NEGATIVE
Leukocytes,Ua: NEGATIVE
Nitrite: NEGATIVE
Specific Gravity, Urine: 1.02 (ref 1.000–1.030)
Total Protein, Urine: NEGATIVE
Urine Glucose: >=1000 — AB
Urobilinogen, UA: 0.2 (ref 0.0–1.0)
pH: 6 (ref 5.0–8.0)

## 2020-08-04 LAB — CBC WITH DIFFERENTIAL/PLATELET
Basophils Absolute: 0 10*3/uL (ref 0.0–0.1)
Basophils Relative: 0.4 % (ref 0.0–3.0)
Eosinophils Absolute: 0.1 10*3/uL (ref 0.0–0.7)
Eosinophils Relative: 2.1 % (ref 0.0–5.0)
HCT: 49.7 % (ref 39.0–52.0)
Hemoglobin: 16.5 g/dL (ref 13.0–17.0)
Lymphocytes Relative: 23.3 % (ref 12.0–46.0)
Lymphs Abs: 1.5 10*3/uL (ref 0.7–4.0)
MCHC: 33.2 g/dL (ref 30.0–36.0)
MCV: 88.4 fl (ref 78.0–100.0)
Monocytes Absolute: 0.7 10*3/uL (ref 0.1–1.0)
Monocytes Relative: 11.6 % (ref 3.0–12.0)
Neutro Abs: 4.1 10*3/uL (ref 1.4–7.7)
Neutrophils Relative %: 62.6 % (ref 43.0–77.0)
Platelets: 184 10*3/uL (ref 150.0–400.0)
RBC: 5.62 Mil/uL (ref 4.22–5.81)
RDW: 15.1 % (ref 11.5–15.5)
WBC: 6.5 10*3/uL (ref 4.0–10.5)

## 2020-08-04 LAB — HEMOGLOBIN A1C: Hgb A1c MFr Bld: 6.4 % (ref 4.6–6.5)

## 2020-08-04 LAB — TSH: TSH: 0.21 u[IU]/mL — ABNORMAL LOW (ref 0.35–4.50)

## 2020-08-04 LAB — MICROALBUMIN / CREATININE URINE RATIO
Creatinine,U: 109.8 mg/dL
Microalb Creat Ratio: 8.8 mg/g (ref 0.0–30.0)
Microalb, Ur: 9.7 mg/dL — ABNORMAL HIGH (ref 0.0–1.9)

## 2020-08-04 NOTE — Patient Instructions (Signed)
Type 2 Diabetes Mellitus, Diagnosis, Adult Type 2 diabetes (type 2 diabetes mellitus) is a long-term, or chronic, disease. In type 2 diabetes, one or both of these problems may be present:  The pancreas does not make enough of a hormone called insulin.  Cells in the body do not respond properly to insulin that the body makes (insulin resistance). Normally, insulin allows blood sugar (glucose) to enter cells in the body. The cells use glucose for energy. Insulin resistance or lack of insulin causes excess glucose to build up in the blood instead of going into cells. This causes high blood glucose (hyperglycemia).  What are the causes? The exact cause of type 2 diabetes is not known. What increases the risk? The following factors may make you more likely to develop this condition:  Having a family member with type 2 diabetes.  Being overweight or obese.  Being inactive (sedentary).  Having been diagnosed with insulin resistance.  Having a history of prediabetes, diabetes when you were pregnant (gestational diabetes), or polycystic ovary syndrome (PCOS). What are the signs or symptoms? In the early stage of this condition, you may not have symptoms. Symptoms develop slowly and may include:  Increased thirst or hunger.  Increased urination.  Unexplained weight loss.  Tiredness (fatigue) or weakness.  Vision changes, such as blurry vision.  Dark patches on the skin. How is this diagnosed? This condition is diagnosed based on your symptoms, your medical history, a physical exam, and your blood glucose level. Your blood glucose may be checked with one or more of the following blood tests:  A fasting blood glucose (FBG) test. You will not be allowed to eat (you will fast) for 8 hours or longer before a blood sample is taken.  A random blood glucose test. This test checks blood glucose at any time of day regardless of when you ate.  An A1C (hemoglobin A1C) blood test. This test  provides information about blood glucose levels over the previous 2-3 months.  An oral glucose tolerance test (OGTT). This test measures your blood glucose at two times: ? After fasting. This is your baseline blood glucose level. ? Two hours after drinking a beverage that contains glucose. You may be diagnosed with type 2 diabetes if:  Your fasting blood glucose level is 126 mg/dL (7.0 mmol/L) or higher.  Your random blood glucose level is 200 mg/dL (11.1 mmol/L) or higher.  Your A1C level is 6.5% or higher.  Your oral glucose tolerance test result is higher than 200 mg/dL (11.1 mmol/L). These blood tests may be repeated to confirm your diagnosis.   How is this treated? Your treatment may be managed by a specialist called an endocrinologist. Type 2 diabetes may be treated by following instructions from your health care provider about:  Making dietary and lifestyle changes. These may include: ? Following a personalized nutrition plan that is developed by a registered dietitian. ? Exercising regularly. ? Finding ways to manage stress.  Checking your blood glucose level as often as told.  Taking diabetes medicines or insulin daily. This helps to keep your blood glucose levels in the healthy range.  Taking medicines to help prevent complications from diabetes. Medicines may include: ? Aspirin. ? Medicine to lower cholesterol. ? Medicine to control blood pressure. Your health care provider will set treatment goals for you. Your goals will be based on your age, other medical conditions you have, and how you respond to diabetes treatment. Generally, the goal of treatment is to maintain the   following blood glucose levels:  Before meals: 80-130 mg/dL (4.4-7.2 mmol/L).  After meals: below 180 mg/dL (10 mmol/L).  A1C level: less than 7%. Follow these instructions at home: Questions to ask your health care provider Consider asking the following questions:  Should I meet with a certified  diabetes care and education specialist?  What diabetes medicines do I need, and when should I take them?  What equipment will I need to manage my diabetes at home?  How often do I need to check my blood glucose?  Where can I find a support group for people with diabetes?  What number can I call if I have questions?  When is my next appointment? General instructions  Take over-the-counter and prescription medicines only as told by your health care provider.  Keep all follow-up visits as told by your health care provider. This is important. Where to find more information  American Diabetes Association (ADA): www.diabetes.org  American Association of Diabetes Care and Education Specialists (ADCES): www.diabeteseducator.org  International Diabetes Federation (IDF): www.idf.org Contact a health care provider if:  Your blood glucose is at or above 240 mg/dL (13.3 mmol/L) for 2 days in a row.  You have been sick or have had a fever for 2 days or longer, and you are not getting better.  You have any of the following problems for more than 6 hours: ? You cannot eat or drink. ? You have nausea and vomiting. ? You have diarrhea. Get help right away if:  You have severe hypoglycemia. This means your blood glucose is lower than 54 mg/dL (3.0 mmol/L).  You become confused or you have trouble thinking clearly.  You have difficulty breathing.  You have moderate or large ketone levels in your urine. These symptoms may represent a serious problem that is an emergency. Do not wait to see if the symptoms will go away. Get medical help right away. Call your local emergency services (911 in the [place] Do not drive yourself to the hospital. Summary  Type 2 diabetes (type 2 diabetes mellitus) is a long-term, or chronic, disease. In type 2 diabetes, the pancreas does not make enough of a hormone called insulin, or cells in the body do not respond properly to insulin that the body makes (insulin  resistance).  This condition is treated by making dietary and lifestyle changes and taking diabetes medicines or insulin.  Your health care provider will set treatment goals for you. Your goals will be based on your age, other medical conditions you have, and how you respond to diabetes treatment.  Keep all follow-up visits as told by your health care provider. This is important. This information is not intended to replace advice given to you by your health care provider. Make sure you discuss any questions you have with your health care provider. Document Revised: 09/26/2019 Document Reviewed: 09/26/2019 Elsevier Patient Education  2021 [employer]  

## 2020-08-04 NOTE — Progress Notes (Signed)
Subjective:  Patient ID: Devon Brady, male    DOB: May 11, 1950  Age: 70 y.o. MRN: [id number]  CC: Hypertension, Hypothyroidism, and Diabetes  This visit occurred during the SARS-CoV-2 public health emergency.  Safety protocols were in place, including screening questions prior to the visit, additional usage of staff PPE, and extensive cleaning of exam room while observing appropriate contact time as indicated for disinfecting solutions.    HPI Devon Brady presents for f/up -   He has lost weight with lifestyle modifications. He is active and denies CP, DOE, palpitations, edema, fatigue.  Outpatient Medications Prior to Visit  Medication Sig Dispense Refill  . amLODipine (NORVASC) 5 MG tablet TAKE 1 TABLET BY MOUTH EVERY DAY 90 tablet 0  . aspirin EC 81 MG tablet Take 81 mg by mouth every evening.     Marland Kitchen FARXIGA 10 MG TABS tablet TAKE 1 TABLET (10 MG TOTAL) BY MOUTH DAILY BEFORE BREAKFAST. 90 tablet 1  . metoprolol succinate (TOPROL-XL) 50 MG 24 hr tablet TAKE 1 TABLET BY MOUTH EVERY DAY 90 tablet 0  . nitroGLYCERIN (NITROSTAT) 0.4 MG SL tablet PLACE 1 TABLET UNDER THE TONGUE EVERY 5 MINUTES AS NEEDED FOR CHEST PAIN 25 tablet 6  . PRALUENT 75 MG/ML SOAJ INJECT 1 PEN INTO THE SKIN EVERY 14 DAYS 2 mL 11  . rosuvastatin (CRESTOR) 10 MG tablet TAKE 1 TABLET BY MOUTH 3 TIMES A WEEK AS TOLERATED. PLEASE MAKE ANNUAL APPT WITH DR FOR MORE REFILLS 45 tablet 3  . diclofenac Sodium (VOLTAREN) 1 % GEL Apply 2 g topically 4 (four) times daily. Rub into affected area of foot 2 to 4 times daily 100 g 2  . ezetimibe (ZETIA) 10 MG tablet Take 1 tablet (10 mg total) by mouth daily. 90 tablet 2  . tadalafil (CIALIS) 5 MG tablet Take 1 tablet (5 mg total) by mouth daily as needed for erectile dysfunction. 90 tablet 1   No facility-administered medications prior to visit.    ROS Review of Systems  Constitutional: Negative for chills, diaphoresis, fatigue and fever.  HENT: Negative.  Negative  for sore throat and trouble swallowing.   Eyes: Negative.   Respiratory: Negative.  Negative for cough, chest tightness, shortness of breath and wheezing.   Cardiovascular: Negative for chest pain, palpitations and leg swelling.  Gastrointestinal: Negative for abdominal pain, constipation, diarrhea, nausea and vomiting.  Endocrine: Negative.   Genitourinary: Negative.   Musculoskeletal: Negative.  Negative for arthralgias and myalgias.  Skin: Negative.   Neurological: Negative.  Negative for dizziness, weakness, light-headedness and headaches.  Hematological: Negative for adenopathy. Does not bruise/bleed easily.  Psychiatric/Behavioral: Negative.     Objective:  BP 124/74 (BP Location: Right Arm, Patient Position: Sitting, Cuff Size: Large)   Pulse 68   Temp 98 F (36.7 C) (Oral)   Ht 5\' 11"  (1.803 m)   Wt 231 lb (104.8 kg)   SpO2 96%   BMI 32.22 kg/m   BP Readings from Last 3 Encounters:  08/04/20 124/74  06/09/20 138/70  02/11/20 138/84    Wt Readings from Last 3 Encounters:  08/04/20 231 lb (104.8 kg)  06/09/20 235 lb 3.2 oz (106.7 kg)  02/11/20 236 lb (107 kg)    Physical Exam Vitals reviewed.  Constitutional:      Appearance: Normal appearance.  HENT:     Nose: Nose normal.     Mouth/Throat:     Mouth: Mucous membranes are moist.  Eyes:     General: No  scleral icterus.    Conjunctiva/sclera: Conjunctivae normal.  Cardiovascular:     Rate and Rhythm: Normal rate and regular rhythm.     Heart sounds: No murmur heard.   Pulmonary:     Effort: Pulmonary effort is normal.     Breath sounds: No stridor. No wheezing, rhonchi or rales.  Abdominal:     General: Abdomen is flat.     Palpations: There is no mass.     Tenderness: There is no abdominal tenderness. There is no guarding.  Musculoskeletal:        General: Normal range of motion.     Cervical back: Neck supple.     Right lower leg: No edema.     Left lower leg: No edema.  Lymphadenopathy:      Cervical: No cervical adenopathy.  Skin:    General: Skin is warm and dry.  Neurological:     General: No focal deficit present.     Mental Status: He is alert.  Psychiatric:        Mood and Affect: Mood normal.        Behavior: Behavior normal.     Lab Results  Component Value Date   WBC 6.5 08/04/2020   HGB 16.5 08/04/2020   HCT 49.7 08/04/2020   PLT 184.0 08/04/2020   GLUCOSE 102 (H) 08/04/2020   CHOL 106 02/11/2020   TRIG 77.0 02/11/2020   HDL 55.70 02/11/2020   LDLCALC 35 02/11/2020   ALT 16 02/11/2020   AST 20 02/11/2020   NA 143 08/04/2020   K 4.2 08/04/2020   CL 108 08/04/2020   CREATININE 1.69 (H) 08/04/2020   BUN 17 08/04/2020   CO2 27 08/04/2020   TSH 0.21 (L) 08/04/2020   PSA 1.81 02/11/2020   INR 0.97 10/02/2013   HGBA1C 6.4 08/04/2020   MICROALBUR 9.7 (H) 08/04/2020    DG Chest 2 View  Result Date: 10/23/2019 CLINICAL DATA:  Cough and shortness of breath for 2 weeks. Coronary artery disease. EXAM: CHEST - 2 VIEW COMPARISON:  01/26/2017 FINDINGS: The heart size and mediastinal contours are within normal limits. 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 Both lungs are clear. The visualized skeletal structures are unremarkable. IMPRESSION: No active cardiopulmonary disease. Electronically Signed   By: Marlaine Hind M.D.   On: 10/23/2019 17:11    Assessment & Plan:   Devon Brady was seen today for hypertension, hypothyroidism and diabetes.  Diagnoses and all orders for this visit:  Essential hypertension- His blood pressure is adequately well controlled. -     CBC with Differential/Platelet; Future -     Basic metabolic panel; Future -     TSH; Future -     Cancel: Urinalysis, Routine w reflex microscopic; Future -     Urinalysis, Routine w reflex microscopic; Future -     Urinalysis, Routine w reflex microscopic -     TSH -     Basic metabolic panel -     CBC with Differential/Platelet  Hypothyroidism, postsurgical- His TSH is slightly suppressed.  Thyroid  replacement therapy is not indicated.  This is consistent with subclinical hyperthyroidism. -     TSH; Future -     TSH  Microalbuminuria due to type 2 diabetes mellitus (HCC) -     Cancel: Microalbumin / creatinine urine ratio; Future -     Microalbumin / creatinine urine ratio; Future -     Urinalysis, Routine w reflex microscopic; Future -     Urinalysis, Routine w reflex  microscopic -     Microalbumin / creatinine urine ratio  Type II diabetes mellitus with manifestations ([place])- His blood sugar is adequately well controlled. -     Basic metabolic panel; Future -     Cancel: Microalbumin / creatinine urine ratio; Future -     Hemoglobin A1c; Future -     Microalbumin / creatinine urine ratio; Future -     Microalbumin / creatinine urine ratio -     Hemoglobin A1c -     Basic metabolic panel  Stage 3b chronic kidney disease ([place])- His renal function is stable. -     CBC with Differential/Platelet; Future -     Basic metabolic panel; Future -     Cancel: Microalbumin / creatinine urine ratio; Future -     Cancel: Urinalysis, Routine w reflex microscopic; Future -     Microalbumin / creatinine urine ratio; Future -     Urinalysis, Routine w reflex microscopic; Future -     Urinalysis, Routine w reflex microscopic -     Microalbumin / creatinine urine ratio -     Basic metabolic panel -     CBC with Differential/Platelet   I have discontinued Cletus Gash B. Coletti's tadalafil and diclofenac Sodium. I am also having him maintain his aspirin EC, nitroGLYCERIN, rosuvastatin, Praluent, Farxiga, metoprolol succinate, and amLODipine.  No orders of the defined types were placed in this encounter.    Follow-up: Return in about 6 months (around 02/04/2021).  Scarlette Calico, MD

## 2020-08-05 ENCOUNTER — Telehealth: Payer: Self-pay | Admitting: Internal Medicine

## 2020-08-05 ENCOUNTER — Other Ambulatory Visit: Payer: Self-pay | Admitting: Cardiovascular Disease

## 2020-08-05 ENCOUNTER — Other Ambulatory Visit (HOSPITAL_COMMUNITY): Payer: Self-pay

## 2020-08-05 ENCOUNTER — Telehealth (INDEPENDENT_AMBULATORY_CARE_PROVIDER_SITE_OTHER): Payer: Managed Care, Other (non HMO) | Admitting: Family Medicine

## 2020-08-05 DIAGNOSIS — U071 COVID-19: Secondary | ICD-10-CM | POA: Diagnosis not present

## 2020-08-05 MED ORDER — BENZONATATE 100 MG PO CAPS
100.0000 mg | ORAL_CAPSULE | Freq: Three times a day (TID) | ORAL | 0 refills | Status: DC | PRN
  Filled 2020-08-05: qty 20, 7d supply, fill #0

## 2020-08-05 MED ORDER — MOLNUPIRAVIR EUA 200MG CAPSULE
4.0000 | ORAL_CAPSULE | Freq: Two times a day (BID) | ORAL | 0 refills | Status: AC
  Filled 2020-08-05: qty 40, 5d supply, fill #0

## 2020-08-05 NOTE — Telephone Encounter (Signed)
Patient called and said that he tested positive for Covid 19 yesterday. He has a virtual appointment with Dr. Colin Benton this afternoon. Please advise

## 2020-08-05 NOTE — Patient Instructions (Addendum)
HOME CARE TIPS:  -[place] testing information: [email or web address]-[email or web address] OR [phone] Most pharmacies also offer testing and home test kits. If the Covid19 test is positive, please make a prompt follow up visit with your primary care office or with [facility] to discuss treatment options. Treatments for Covid19 are best given early in the course of the illness.   -I sent the medication(s) we discussed to your pharmacy: Meds ordered this encounter  Medications  . molnupiravir EUA 200 mg CAPS    Sig: Take 4 capsules (800 mg total) by mouth 2 (two) times daily for 5 days.    Dispense:  40 capsule    Refill:  0  . benzonatate (TESSALON PERLES) 100 MG capsule    Sig: Take 1 capsule (100 mg total) by mouth 3 (three) times daily as needed.    Dispense:  20 capsule    Refill:  0     -I sent in the [facility] treatment or referral you requested per our discussion. Please see the information provided below and discuss further with the pharmacist/treatment team.   -can use tylenol  if needed for fevers, aches and pains per instructions  -can use nasal saline a few times per day if you have nasal congestion; sometimes  a short course of Afrin nasal spray for 3 days can help with symptoms as well  -stay hydrated, drink plenty of fluids and eat small healthy meals - avoid dairy  -can take 1000 IU (57mg) Vit D3 and 100-500 mg of Vit C daily per instructions  -If the Covid test is positive, check out the [facility] for more information on home care, transmission and treatment for COVID19  -follow up with your doctor in 2-3 days unless improving and feeling better  -stay home while sick, except to seek medical care. If you have COVID19, ideally it would be best to stay home for a full 10 days since the onset of symptoms PLUS one day of no fever and feeling better. Wear a good mask that fits snugly (such as N95 or KN95) if around others to reduce the  risk of transmission.  It was nice to meet you today, and I really hope you are feeling better soon. I help [facility] out with telemedicine visits on Tuesdays and Thursdays and am available for visits on those days. If you have any concerns or questions following this visit please schedule a follow up visit with your Primary Care doctor or seek care at a local urgent care clinic to avoid delays in care.    Seek in person care or schedule a follow up video visit promptly if your symptoms worsen, new concerns arise or you are not improving with treatment. Seek inperson care right away if difficulty breathing chest pain, inablity to get out of bed/eat/drink, severe headache or any other concerning symptoms. Call 911 and/or seek emergency care if your symptoms are severe or life threatening.     Fact Sheet for Patients And Caregivers Emergency Use Authorization (EUA) Of LAGEVRIOT (molnupiravir) capsules For Coronavirus Disease 2019 (COVID-19)  What is the most important information I should know about LAGEVRIO? LAGEVRIO may cause serious side effects, including: . LAGEVRIO may cause harm to your unborn baby. It is not known if LAGEVRIO will harm your baby if you take LAGEVRIO during pregnancy. o LAGEVRIO is not recommended for use in pregnancy. o LAGEVRIO has not been studied in pregnancy. LAGEVRIO was studied in pregnant animals only. When LAGEVRIO was given to pregnant animals, LAGEVRIO  caused harm to their unborn babies. o You and your healthcare provider may decide that you should take LAGEVRIO during pregnancy if there are no other COVID-19 treatment options approved or authorized by the FDA that are accessible or clinically appropriate for you. o If you and your healthcare provider decide that you should take LAGEVRIO during pregnancy, you and your healthcare provider should discuss the known and potential benefits and the potential risks of taking LAGEVRIO during pregnancy. For  individuals who are able to become pregnant: . You should use a reliable method of birth control (contraception) consistently and correctly during treatment with LAGEVRIO and for 4 days after the last dose of LAGEVRIO. Talk to your healthcare provider about reliable birth control methods. . Before starting treatment with [facility] your healthcare provider may do a pregnancy test to see if you are pregnant before starting treatment with LAGEVRIO. . Tell your healthcare provider right away if you become pregnant or think you may be pregnant during treatment with LAGEVRIO. Pregnancy Surveillance Program: . There is a pregnancy surveillance program for individuals who take LAGEVRIO during pregnancy. The purpose of this program is to collect information about the health of you and your baby. Talk to your healthcare provider about how to take part in this program. . If you take LAGEVRIO during pregnancy and you agree to participate in the pregnancy surveillance program and allow your healthcare provider to share your information with [facility], then your healthcare provider will report your use of [place] during pregnancy to [facility]. by calling [phone] or PeacefulBlog.es. For individuals who are sexually active with partners who are able to become pregnant: . It is not known if LAGEVRIO can affect sperm. While the risk is regarded as low, animal studies to fully assess the potential for LAGEVRIO to affect the babies of males treated with LAGEVRIO have not been completed. A reliable method of birth control (contraception) should be used consistently and correctly during treatment with LAGEVRIO and for at least 3 months after the last dose. The risk to sperm beyond 3 months is not known. Studies to understand the risk to sperm beyond 3 months are ongoing. Talk to your healthcare provider about reliable birth control methods. Talk to your healthcare  provider if you have questions or concerns about how LAGEVRIO may affect sperm. You are being given this fact sheet because your healthcare provider believes it is necessary to provide you with LAGEVRIO for the treatment of adults with mild-to-moderate coronavirus disease 2019 (COVID-19) with positive results of direct SARS-CoV-2 viral testing, and who are at high risk for progression to severe COVID-19 including hospitalization or death, and for whom other COVID-19 treatment options approved or authorized by the FDA are not accessible or clinically appropriate. The U.S. Food and Drug Administration (FDA) has issued an Emergency Use Authorization (EUA) to make LAGEVRIO available during the COVID-19 pandemic (for more details about an EUA please see "What is an Emergency Use Authorization?" at the end of this document). LAGEVRIO is not an FDA-approved medicine in the [place]. Read this Fact Sheet for information about LAGEVRIO. Talk to your healthcare provider about your options if you have any questions. It is your choice to take LAGEVRIO.  What is COVID-19? COVID-19 is caused by a virus called a coronavirus. You can get COVID-19 through close contact with another person who has the virus. COVID-19 illnesses have ranged from very mild-to-severe, including illness resulting in death. While information so far suggests  that most COVID-19 illness is mild, serious illness can happen and may cause some of your other medical conditions to become worse. Older people and people of all ages with severe, long lasting (chronic) medical conditions like heart disease, lung disease and diabetes, for example seem to be at higher risk of being hospitalized for COVID-19.  What is LAGEVRIO? LAGEVRIO is an investigational medicine used to treat mild-to-moderate COVID-19 in adults: . with positive results of direct SARS-CoV-2 viral testing, and . who are at high risk for progression to severe COVID-19  including hospitalization or death, and for whom other COVID-19 treatment options approved or authorized by the FDA are not accessible or clinically appropriate. The FDA has authorized the emergency use of LAGEVRIO for the treatment of mild-tomoderate COVID-19 in adults under an EUA. For more information on EUA, see the "What is an Emergency Use Authorization (EUA)?" section at the end of this Fact Sheet. LAGEVRIO is not authorized: . for use in people less than 37 years of age. . for prevention of COVID-19. . for people needing hospitalization for COVID-19. . for use for longer than 5 consecutive days.  What should I tell my healthcare provider before I take LAGEVRIO? Tell your healthcare provider if you: . Have any allergies . Are breastfeeding or plan to breastfeed . Have any serious illnesses . Are taking any medicines (prescription, over-the-counter, vitamins, or herbal products).  How do I take LAGEVRIO? Marland Kitchen Take LAGEVRIO exactly as your healthcare provider tells you to take it. . Take 4 capsules of LAGEVRIO every 12 hours (for example, at 8 am and at 8 pm) . Take LAGEVRIO for 5 days. It is important that you complete the full 5 days of treatment with LAGEVRIO. Do not stop taking LAGEVRIO before you complete the full 5 days of treatment, even if you feel better. . Take LAGEVRIO with or without food. . You should stay in isolation for as long as your healthcare provider tells you to. Talk to your healthcare provider if you are not sure about how to properly isolate while you have COVID-19. Marland Kitchen Swallow LAGEVRIO capsules whole. Do not open, break, or crush the capsules. If you cannot swallow capsules whole, tell your healthcare provider. . What to do if you miss a dose: o If it has been less than 10 hours since the missed dose, take it as soon as you remember o If it has been more than 10 hours since the missed dose, skip the missed dose and take your dose at the next scheduled  time. . Do not double the dose of LAGEVRIO to make up for a missed dose.  What are the important possible side effects of LAGEVRIO? . See, "What is the most important information I should know about LAGEVRIO?" . Allergic Reactions. Allergic reactions can happen in people taking LAGEVRIO, even after only 1 dose. Stop taking LAGEVRIO and call your healthcare provider right away if you get any of the following symptoms of an allergic reaction: o hives o rapid heartbeat o trouble swallowing or breathing o swelling of the mouth, lips, or face o throat tightness o hoarseness o skin rash The most common side effects of LAGEVRIO are: . diarrhea . nausea . dizziness These are not all the possible side effects of LAGEVRIO. Not many people have taken LAGEVRIO. Serious and unexpected side effects may happen. This medicine is still being studied, so it is possible that all of the risks are not known at this time.  What other  treatment choices are there?  Veklury (remdesivir) is FDA-approved as an intravenous (IV) infusion for the treatment of mildto-moderate [id number] in certain adults and children. Talk with your doctor to see if Marijean Heath is appropriate for you. Like LAGEVRIO, FDA may also allow for the emergency use of other medicines to treat people with COVID-19. Go to LacrosseProperties.[email or web address] for more information. It is your choice to be treated or not to be treated with LAGEVRIO. Should you decide not to take it, it will not change your standard medical care.  What if I am breastfeeding? Breastfeeding is not recommended during treatment with LAGEVRIO and for 4 days after the last dose of LAGEVRIO. If you are breastfeeding or plan to breastfeed, talk to your healthcare provider about your options and specific situation before taking LAGEVRIO.  How do I report side effects with  LAGEVRIO? Contact your healthcare provider if you have any side effects that bother you or do not go away. Report side effects to FDA MedWatch at SmoothHits.hu or call 1-800-FDA-1088 (1- 270-808-9793).  How should I store [place]? Marland Kitchen Store LAGEVRIO capsules at room temperature between 45F to 23F (20C to 25C). Marland Kitchen Keep LAGEVRIO and all medicines out of the reach of children and pets. How can I learn more about COVID-19? Marland Kitchen Ask your healthcare provider. . Visit SeekRooms.co.[email or web address] . Contact your local or state public health department. . Call [place] at [phone] (toll free in the [place] . Visit [email or web address]  What Is an Emergency Use Authorization (EUA)? The [place] FDA has made [place] available under an emergency access mechanism called an Emergency Use Authorization (EUA) The EUA is supported by a Presenter, broadcasting Health and Human Service (HHS) declaration that circumstances exist to justify emergency use of drugs and biological products during the COVID-19 pandemic. LAGEVRIO for the treatment of mild-to-moderate COVID-19 in adults with positive results of direct SARS-CoV-2 viral testing, who are at high risk for progression to severe COVID-19, including hospitalization or death, and for whom alternative COVID-19 treatment options approved or authorized by FDA are not accessible or clinically appropriate, has not undergone the same type of review as an FDA-approved product. In issuing an EUA under the SJGGE-36 public health emergency, the FDA has determined, among other things, that based on the total amount of scientific evidence available including data from adequate and well-controlled clinical trials, if available, it is reasonable to believe that the product may be effective for diagnosing, treating, or preventing COVID-19, or a serious or life-threatening disease or condition caused by COVID-19; that the known and potential benefits of the  product, when used to diagnose, treat, or prevent such disease or condition, outweigh the known and potential risks of such product; and that there are no adequate, approved, and available alternatives.  All of these criteria must be met to allow for the product to be used in the treatment of patients during the COVID-19 pandemic. The EUA for LAGEVRIO is in effect for the duration of the COVID-19 declaration justifying emergency use of LAGEVRIO, unless terminated or revoked (after which LAGEVRIO may no longer be used under the EUA). For patent information: http://rogers.info/ Copyright  2021-2022 [place], [place] and its affiliates. All rights reserved. [id number] Revised: March 2022

## 2020-08-05 NOTE — Progress Notes (Signed)
Virtual Visit via Video Note  I connected with [place]  on 08/05/20 at  1:00 PM EDT by a video enabled telemedicine application and verified that I am speaking with the correct person using two identifiers.  Location patient: home, [place] Location provider:work or home office Persons participating in the virtual visit: patient, provider  I discussed the limitations of evaluation and management by telemedicine and the availability of in person appointments. The patient expressed understanding and agreed to proceed.   HPI:  Acute telemedicine visit for Covid19: -Onset: 2 days ago and tested positive last night -Symptoms include: nasal congestion, fatigue, some body aches, mild cough -Denies:fever, CP, SOB, NVD, inability to eat/drink/get out of bed -Pertinent past medical history: see below - several high risk medical conditions; denies any immunosuppressive drugs currently or current or recent cancer treatments -Pertinent medication allergies:  Allergies  Allergen Reactions  . Crestor [Rosuvastatin]     Muscle aches  . Cialis [Tadalafil]     headache   -COVID-19 vaccine status: has had both doses and 2 boosters -had labs yesterday and GFR is 41, did not have liver labs  ROS: See pertinent positives and negatives per HPI.  Past Medical History:  Diagnosis Date  . CAD (coronary artery disease)   . H/O colonoscopy 2013  . H/O hiatal hernia   . Hemorrhoid   . History of colon polyps   . Hodgkin's lymphoma ([place])   . Hydronephrosis   . Hyperlipidemia   . Hypertension   . Hypothyroidism   . Myocardial infarction [facility]) 2011   "mild one, before the CABG"  . 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 Nocturia   . 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 Renal insufficiency   . Sleep apnea    No CPAP  . TIA (transient ischemic attack) 2011    Past Surgical History:  Procedure Laterality Date  . BONE MARROW ASPIRATE,BIOPSY, AND  CLOT  06/11/11   LEFT ILIAC CREAST  . BUNIONECTOMY Right 1978   foot  . CARDIAC CATHETERIZATION  10/15/2009  . COLONOSCOPY    . CORONARY ARTERY BYPASS GRAFT  10/17/2009   LIMA to LAD,SVG to Ramus,SVG to OM sequential to OM2, SVG to marginal of RCA  . ESOPHAGOGASTRODUODENOSCOPY    . LEFT HEART CATHETERIZATION WITH CORONARY/GRAFT ANGIOGRAM N/A 10/03/2013   Procedure: LEFT HEART CATHETERIZATION WITH Beatrix Fetters;  Surgeon: Sinclair Grooms, MD;  Location: [facility] CATH LAB;  Service: Cardiovascular;  Laterality: N/A;  . LYMPH NODE BIOPSY  06/07/11   RIGHT INGUINAL NODE: CLASSICAL HODGKIN'S LYMPHOMA, NODULAR SCLEROSIS TYPE  . PORT-A-CATH REMOVAL Left 12/06/2013   Procedure: REMOVAL PORT-A-CATH;  Surgeon: [name], MD;  Location: [place];  Service: General;  Laterality: Left;  . PORTACATH PLACEMENT  06/10/2011   Procedure: INSERTION PORT-A-CATH;  Surgeon: [name], MD;  [facility];  Service: General;  Laterality: Left;  subclavian   . THYROIDECTOMY  03/31/2012   Procedure: THYROIDECTOMY;  Surgeon: Izora Gala, MD;  Location: [place];  Service: ENT;  Laterality: N/A;  . TONSILLECTOMY     as a child  . VASECTOMY  1980     Current Outpatient Medications:  .  benzonatate (TESSALON PERLES) 100 MG capsule, Take 1 capsule (100 mg total) by mouth 3 (three) times daily as needed., Disp: 20 capsule, Rfl: 0 .  molnupiravir EUA 200 mg CAPS, Take 4 capsules (800 mg total) by mouth 2 (two)  times daily for 5 days., Disp: 40 capsule, Rfl: 0 .  amLODipine (NORVASC) 5 MG tablet, TAKE 1 TABLET BY MOUTH EVERY DAY, Disp: 90 tablet, Rfl: 0 .  aspirin EC 81 MG tablet, Take 81 mg by mouth every evening. , Disp: , Rfl:  .  ezetimibe (ZETIA) 10 MG tablet, Take 1 tablet (10 mg total) by mouth daily., Disp: 90 tablet, Rfl: 2 .  FARXIGA 10 MG TABS tablet, TAKE 1 TABLET (10 MG TOTAL) BY MOUTH DAILY BEFORE BREAKFAST., Disp: 90 tablet, Rfl: 1 .  metoprolol succinate (TOPROL-XL) 50 MG 24 hr  tablet, TAKE 1 TABLET BY MOUTH EVERY DAY, Disp: 90 tablet, Rfl: 0 .  nitroGLYCERIN (NITROSTAT) 0.4 MG SL tablet, PLACE 1 TABLET UNDER THE TONGUE EVERY 5 MINUTES AS NEEDED FOR CHEST PAIN, Disp: 25 tablet, Rfl: 6 .  PRALUENT 75 MG/ML SOAJ, INJECT 1 PEN INTO THE SKIN EVERY 14 DAYS, Disp: 2 mL, Rfl: 11 .  rosuvastatin (CRESTOR) 10 MG tablet, TAKE 1 TABLET BY MOUTH 3 TIMES A WEEK AS TOLERATED. PLEASE MAKE ANNUAL APPT WITH DR FOR MORE REFILLS, Disp: 45 tablet, Rfl: 3  EXAM:  VITALS per patient if applicable:  GENERAL: alert, oriented, appears well and in no acute distress  HEENT: atraumatic, conjunttiva clear, no obvious abnormalities on inspection of external nose and ears  NECK: normal movements of the head and neck  LUNGS: on inspection no signs of respiratory distress, breathing rate appears normal, no obvious gross SOB, gasping or wheezing  CV: no obvious cyanosis  MS: moves all visible extremities without noticeable abnormality  PSYCH/NEURO: pleasant and cooperative, no obvious depression or anxiety, speech and thought processing grossly intact  ASSESSMENT AND PLAN:  Discussed the following assessment and plan:  COVID-19  -we discussed possible serious and likely etiologies, options for evaluation and workup, limitations of telemedicine visit vs in person visit, treatment, treatment risks and precautions. Pt prefers to treat via telemedicine empirically rather than in person at this moment.  Discussed treatment options, ideal treatment window, potential complications, isolation and precautions for COVID-19.   The patient opted for treatment with Molnupiravir due to being higher risk for complications of covid or severe disease, preference for oral medication and personal preference after discussion of the renal adjustment and medication interactions with Paxlovid. Discussed EUA status limited knowledge of risks/interactions/side effects per EUA document vs possible benefits and  precautions shared with patient and provided in patient instructions. Also, advised that patient discuss risks/interactions and use with pharmacist/treatment team as well. He also opted for tessalon for cough and further measures summarized in patient instructions.  Work/School slipped offered:  declined Scheduled follow up with PCP offered: he opted for follow up as needed. Advised to seek prompt in person care if worsening, new symptoms arise, or if is not improving with treatment. Discussed options for inperson care if PCP office not available. Did let this patient know that I only do telemedicine on Tuesdays and Thursdays for [place]. Advised to schedule follow up visit with PCP or [facility] if any further questions or concerns to avoid delays in care.   I discussed the assessment and treatment plan with the patient. The patient was provided an opportunity to ask questions and all were answered. The patient agreed with the plan and demonstrated an understanding of the instructions.     Lucretia Kern, DO

## 2020-08-08 ENCOUNTER — Encounter: Payer: Self-pay | Admitting: Internal Medicine

## 2020-08-08 NOTE — Telephone Encounter (Signed)
Patient has called the office still needing advice. Has been wearing masks and gloves around the house and other people. Patient wanted to know how long to wait for a retest, was advised of the [place] and CDC covid protocol.   Pt has also been taking the medication Dr.Kim prescribed

## 2020-08-12 ENCOUNTER — Encounter: Payer: Self-pay | Admitting: Internal Medicine

## 2020-08-12 NOTE — Telephone Encounter (Signed)
  Patient reports he has finished taking  lagevrio Patient calling to report he still has runny nose. He is seeking advice on returning to work. Work note needed  Phone [phone]

## 2020-08-12 NOTE — Telephone Encounter (Signed)
Pt has been informed that work note has been mailed.

## 2020-08-28 ENCOUNTER — Other Ambulatory Visit: Payer: Self-pay | Admitting: Cardiovascular Disease

## 2020-09-10 ENCOUNTER — Other Ambulatory Visit: Payer: Self-pay

## 2020-09-27 ENCOUNTER — Other Ambulatory Visit: Payer: Self-pay | Admitting: Physician Assistant

## 2020-10-02 ENCOUNTER — Other Ambulatory Visit: Payer: Self-pay | Admitting: Cardiovascular Disease

## 2020-10-08 ENCOUNTER — Other Ambulatory Visit: Payer: Self-pay | Admitting: Cardiovascular Disease

## 2020-11-20 ENCOUNTER — Other Ambulatory Visit: Payer: Self-pay

## 2020-11-20 MED ORDER — EZETIMIBE 10 MG PO TABS: 10.0000 mg | ORAL_TABLET | Freq: Every day | ORAL | 0 refills | Status: DC

## 2020-12-05 ENCOUNTER — Other Ambulatory Visit: Payer: Self-pay | Admitting: Internal Medicine

## 2020-12-05 DIAGNOSIS — E118 Type 2 diabetes mellitus with unspecified complications: Secondary | ICD-10-CM

## 2020-12-17 NOTE — Progress Notes (Signed)
Cardiology Office Note    Date:  12/31/2020   ID:  TYRA MICHELLE, DOB [birth year], MRN [id number]   PCP:  Janith Lima, MD   [place]  Cardiologist:  Lauree Chandler, MD   Advanced Practice Provider:  No care team member to display Electrophysiologist:  None   [phone]   Chief Complaint  Patient presents with   Follow-up     History of Present Illness:  Devon Brady is a 70 y.o. male with history of CAD status post CABG times 07/2009, cardiac cath 10/03/2013 patent LIMA to the LAD, patent SVG to the ramus, occluded SVG to OM1 and OM 2, occluded SVG to PDA.  No focal targets for PCI.  Imdur was added but he stopped it.  Stress test 06/2017 no ischemia Praluent started 07/2017.  Hypertension, HLD, stage IV Hodgkin's lymphoma.  Patient last saw Dr. Angelena Form 07/16/2019 was doing well.  No changes made.  Patient comes in for f/u. Doing well. Walking 2 miles several days a week. No chest pain, dyspnea, palpitations, dizziness or presyncope.Brother just had CABG and sister has CHF/Lymphoma of heart/pacemaker-living with him now.  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   s/p total thyroidectomy   Renal insufficiency    Sleep apnea    No CPAP   TIA (transient ischemic attack) 2011    Past Surgical History:  Procedure Laterality Date   BONE MARROW ASPIRATE,BIOPSY, AND CLOT  06/11/11   LEFT ILIAC CREAST   BUNIONECTOMY Right 1978   foot   CARDIAC CATHETERIZATION  10/15/2009   COLONOSCOPY     CORONARY ARTERY BYPASS GRAFT  10/17/2009   LIMA to  LAD,SVG to Ramus,SVG to OM sequential to OM2, SVG to marginal of RCA   ESOPHAGOGASTRODUODENOSCOPY     LEFT HEART CATHETERIZATION WITH CORONARY/GRAFT ANGIOGRAM N/A 10/03/2013   Procedure: LEFT HEART CATHETERIZATION WITH Beatrix Fetters;  Surgeon: Sinclair Grooms, MD;  Location: [facility] CATH LAB;  Service: Cardiovascular;  Laterality: N/A;   LYMPH NODE BIOPSY  06/07/11   RIGHT INGUINAL NODE: CLASSICAL HODGKIN'S LYMPHOMA, NODULAR SCLEROSIS TYPE   PORT-A-CATH REMOVAL Left 12/06/2013   Procedure: REMOVAL PORT-A-CATH;  Surgeon: [name], MD;  Location: [place];  Service: General;  Laterality: Left;   PORTACATH PLACEMENT  06/10/2011   Procedure: INSERTION PORT-A-CATH;  Surgeon: [name], MD;  [facility];  Service: General;  Laterality: Left;  subclavian    THYROIDECTOMY  03/31/2012   Procedure: THYROIDECTOMY;  Surgeon: Izora Gala, MD;  Location: [place];  Service: ENT;  Laterality: N/A;   TONSILLECTOMY     as a child   VASECTOMY  1980    Current Medications: Current Meds  Medication Sig   amLODipine (NORVASC) 5 MG tablet TAKE 1 TABLET BY MOUTH EVERY DAY   aspirin EC 81 MG tablet Take 81 mg by mouth every evening.    ezetimibe (ZETIA) 10 MG tablet Take 1 tablet (10 mg total) by mouth  daily. Please keep upcoming appt in October 2022 with Cardiologist before anymore refills. Thank you Final Attempt   FARXIGA 10 MG TABS tablet TAKE 1 TABLET BY MOUTH DAILY BEFORE BREAKFAST.   metoprolol succinate (TOPROL-XL) 50 MG 24 hr tablet TAKE 1 TABLET BY MOUTH EVERY DAY   PRALUENT 75 MG/ML SOAJ INJECT 1 PEN INTO THE SKIN EVERY 14 DAYS   rosuvastatin (CRESTOR) 10 MG tablet Take 1 tablet (10 mg total) by mouth 3 (three) times a week.   [DISCONTINUED] nitroGLYCERIN (NITROSTAT) 0.4 MG SL tablet PLACE 1 TABLET UNDER THE TONGUE EVERY 5 MINUTES AS NEEDED FOR CHEST PAIN     Allergies:   Crestor [rosuvastatin]   Social History   Socioeconomic History   Marital status: Married     Spouse name: Not on file   Number of children: 2   Years of education: Not on file   Highest education level: Not on file  Occupational History   Occupation: REGIONAL SPECIALIST    Employer: [employer] ([employer])    Comment: Environmental Health.   Tobacco Use   Smoking status: Never   Smokeless tobacco: Never  Substance and Sexual Activity   Alcohol use: Yes    Comment: 10/02/2013 "couple beers; couple times/month"   Drug use: No   Sexual activity: Never  Other Topics Concern   Not on file  Social History Narrative   Not on file   Social Determinants of Health   Financial Resource Strain: Low Risk    Difficulty of Paying Living Expenses: Not hard at all  Food Insecurity: No Food Insecurity   Worried About Charity fundraiser in the Last Year: Never true   [place] in the Last Year: Never true  Transportation Needs: No Transportation Needs   Lack of Transportation (Medical): No   Lack of Transportation (Non-Medical): No  Physical Activity: Sufficiently Active   Days of Exercise per Week: 5 days   Minutes of Exercise per Session: 60 min  Stress: No Stress Concern Present   Feeling of Stress : Not at all  Social Connections: Socially Integrated   Frequency of Communication with Friends and Family: More than three times a week   Frequency of Social Gatherings with Friends and Family: Once a week   Attends Religious Services: More than 4 times per year   Active Member of [employer] or Organizations: No   Attends Music therapist: More than 4 times per year   Marital Status: Married     Family History:  The patient's  family history includes Alcohol abuse in his brother; Arthritis in his mother; Coronary artery disease (age of onset: 56) in his father; Diabetes in his father; Hypertension in his mother, sister, and sister; Kidney disease in his father; Prostate cancer in his paternal uncle.   ROS:   Please see the history of present illness.    ROS All other  systems reviewed and are negative.   PHYSICAL EXAM:   VS:  BP 110/72   Pulse (!) 54   Ht 5\' 11"  (1.803 m)   Wt 233 lb 12.8 oz (106.1 kg)   SpO2 96%   BMI 32.61 kg/m   Physical Exam  GEN: Obese, in no acute distress  Neck: no JVD, carotid bruits, or masses Cardiac:RRR; S4 Respiratory:  clear to auscultation bilaterally, normal work of breathing GI: soft, nontender, nondistended, + BS Ext: without cyanosis, clubbing, or edema, Good distal pulses bilaterally Neuro:  Alert and Oriented x  3, Psych: euthymic mood, full affect  Wt Readings from Last 3 Encounters:  12/31/20 233 lb 12.8 oz (106.1 kg)  08/04/20 231 lb (104.8 kg)  06/09/20 235 lb 3.2 oz (106.7 kg)      Studies/Labs Reviewed:   EKG:  EKG is  ordered today.  The ekg ordered today demonstrates sinus bradycardia with inf Q waves  Recent Labs: 02/11/2020: ALT 16 08/04/2020: BUN 17; Creatinine, Ser 1.69; Hemoglobin 16.5; Platelets 184.0; Potassium 4.2; Sodium 143; TSH 0.21   Lipid Panel    Component Value Date/Time   CHOL 106 02/11/2020 1003   CHOL 94 (L) 11/18/2017 0830   TRIG 77.0 02/11/2020 1003   HDL 55.70 02/11/2020 1003   HDL 51 11/18/2017 0830   CHOLHDL 2 02/11/2020 1003   VLDL 15.4 02/11/2020 1003   LDLCALC 35 02/11/2020 1003   LDLCALC 32 11/18/2017 0830    Additional studies/ records that were reviewed today include:  GXT 07/07/2017 Blood pressure demonstrated a hypertensive response to exercise. There was no ST segment deviation noted during stress. No T wave inversion was noted during stress. The patient experienced no angina during the stress test. Overall, the patient's exercise capacity was normal. [facility] Treadmill Score: low risk   Negative stress test without evidence of ischemia at given workload.       Risk Assessment/Calculations:         ASSESSMENT:    1. Coronary artery disease involving native coronary artery of native heart without angina pectoris   2. Essential hypertension    3. Mixed hyperlipidemia   4. Obesity (BMI 30.0-34.9)      PLAN:  In order of problems listed above:  CAD status post CABG times 07/2009, cardiac cath 10/03/2013 patent LIMA to the LAD, patent SVG to the ramus, occluded SVG to OM1 and OM 2, occluded SVG to PDA.  No focal targets for PCI.  Imdur was added but he stopped it.  Stress test 06/2017 no ischemia  Hypertension  BP controlled  HLD on Praluent and Zetia, Crestor-check labs today  Obesity-weight loss discussed-he's lost 12 lbs   Shared Decision Making/Informed Consent        Medication Adjustments/Labs and Tests Ordered: Current medicines are reviewed at length with the patient today.  Concerns regarding medicines are outlined above.  Medication changes, Labs and Tests ordered today are listed in the Patient Instructions below. Patient Instructions  Medication Instructions:  Your physician recommends that you continue on your current medications as directed. Please refer to the Current Medication list given to you today.  *If you need a refill on your cardiac medications before your next appointment, please call your pharmacy*   Lab Work: TODAY: CMET, CBC, TSH, FLP  If you have labs (blood work) drawn today and your tests are completely normal, you will receive your results only by: [place] (if you have MyChart) OR A paper copy in the mail If you have any lab test that is abnormal or we need to change your treatment, we will call you to review the results.   Follow-Up: At [facility], you and your health needs are our priority.  As part of our continuing mission to provide you with exceptional heart care, we have created designated Provider Care Teams.  These Care Teams include your primary Cardiologist (physician) and Advanced Practice Providers (APPs -  Physician Assistants and Nurse Practitioners) who all work together to provide you with the care you need, when you need it.   Your next appointment:  1  year(s)  The format for your next appointment:   In Person  Provider:   You may see Lauree Chandler, MD or one of the following Advanced Practice Providers on your designated Care Team:   Melina Copa, PA-C Ermalinda Barrios, PA-C    Signed, Ermalinda Barrios, PA-C  12/31/2020 9:31 AM    [address], [place], [place]  [address] Phone: [phone]; Fax: [phone]

## 2020-12-31 ENCOUNTER — Other Ambulatory Visit: Payer: Self-pay

## 2020-12-31 ENCOUNTER — Encounter: Payer: Self-pay | Admitting: Physician Assistant

## 2020-12-31 ENCOUNTER — Ambulatory Visit (INDEPENDENT_AMBULATORY_CARE_PROVIDER_SITE_OTHER): Payer: Medicare Other | Admitting: Physician Assistant

## 2020-12-31 VITALS — BP 110/72 | HR 54 | Ht 71.0 in | Wt 233.8 lb

## 2020-12-31 DIAGNOSIS — E669 Obesity, unspecified: Secondary | ICD-10-CM

## 2020-12-31 DIAGNOSIS — E782 Mixed hyperlipidemia: Secondary | ICD-10-CM

## 2020-12-31 DIAGNOSIS — I2510 Atherosclerotic heart disease of native coronary artery without angina pectoris: Secondary | ICD-10-CM

## 2020-12-31 DIAGNOSIS — I10 Essential (primary) hypertension: Secondary | ICD-10-CM

## 2020-12-31 LAB — CBC
Hematocrit: 48.7 % (ref 37.5–51.0)
Hemoglobin: 16.2 g/dL (ref 13.0–17.7)
MCH: 29.1 pg (ref 26.6–33.0)
MCHC: 33.3 g/dL (ref 31.5–35.7)
MCV: 88 fL (ref 79–97)
Platelets: 196 10*3/uL (ref 150–450)
RBC: 5.56 x10E6/uL (ref 4.14–5.80)
RDW: 14.2 % (ref 11.6–15.4)
WBC: 5.7 10*3/uL (ref 3.4–10.8)

## 2020-12-31 LAB — COMPREHENSIVE METABOLIC PANEL
ALT: 11 IU/L (ref 0–44)
AST: 21 IU/L (ref 0–40)
Albumin/Globulin Ratio: 1.8 (ref 1.2–2.2)
Albumin: 4.5 g/dL (ref 3.8–4.8)
Alkaline Phosphatase: 79 IU/L (ref 44–121)
BUN/Creatinine Ratio: 11 (ref 10–24)
BUN: 17 mg/dL (ref 8–27)
Bilirubin Total: 0.3 mg/dL (ref 0.0–1.2)
CO2: 22 mmol/L (ref 20–29)
Calcium: 9.5 mg/dL (ref 8.6–10.2)
Chloride: 107 mmol/L — ABNORMAL HIGH (ref 96–106)
Creatinine, Ser: 1.51 mg/dL — ABNORMAL HIGH (ref 0.76–1.27)
Globulin, Total: 2.5 g/dL (ref 1.5–4.5)
Glucose: 109 mg/dL — ABNORMAL HIGH (ref 70–99)
Potassium: 4.3 mmol/L (ref 3.5–5.2)
Sodium: 143 mmol/L (ref 134–144)
Total Protein: 7 g/dL (ref 6.0–8.5)
eGFR: 50 mL/min/{1.73_m2} — ABNORMAL LOW (ref >59)

## 2020-12-31 LAB — TSH: TSH: 0.638 u[IU]/mL (ref 0.450–4.500)

## 2020-12-31 LAB — LIPID PANEL
Chol/HDL Ratio: 1.9 ratio (ref 0.0–5.0)
Cholesterol, Total: 101 mg/dL (ref 100–199)
HDL: 53 mg/dL (ref >39)
LDL Chol Calc (NIH): 34 mg/dL (ref 0–99)
Triglycerides: 66 mg/dL (ref 0–149)
VLDL Cholesterol Cal: 14 mg/dL (ref 5–40)

## 2020-12-31 MED ORDER — NITROGLYCERIN 0.4 MG SL SUBL
SUBLINGUAL_TABLET | SUBLINGUAL | 6 refills | Status: DC
Start: 2020-12-31 — End: 2021-01-06

## 2020-12-31 NOTE — Patient Instructions (Signed)
Medication Instructions:  Your physician recommends that you continue on your current medications as directed. Please refer to the Current Medication list given to you today.  *If you need a refill on your cardiac medications before your next appointment, please call your pharmacy*   Lab Work: TODAY: CMET, CBC, TSH, FLP  If you have labs (blood work) drawn today and your tests are completely normal, you will receive your results only by: [place] (if you have MyChart) OR A paper copy in the mail If you have any lab test that is abnormal or we need to change your treatment, we will call you to review the results.   Follow-Up: At [facility], you and your health needs are our priority.  As part of our continuing mission to provide you with exceptional heart care, we have created designated Provider Care Teams.  These Care Teams include your primary Cardiologist (physician) and Advanced Practice Providers (APPs -  Physician Assistants and Nurse Practitioners) who all work together to provide you with the care you need, when you need it.   Your next appointment:   1 year(s)  The format for your next appointment:   In Person  Provider:   You may see Lauree Chandler, MD or one of the following Advanced Practice Providers on your designated Care Team:   Melina Copa, PA-C Ermalinda Barrios, PA-C

## 2021-01-01 ENCOUNTER — Other Ambulatory Visit: Payer: Self-pay | Admitting: Cardiovascular Disease

## 2021-01-02 ENCOUNTER — Ambulatory Visit: Payer: Medicare Other

## 2021-01-06 ENCOUNTER — Other Ambulatory Visit: Payer: Self-pay

## 2021-01-06 DIAGNOSIS — E782 Mixed hyperlipidemia: Secondary | ICD-10-CM

## 2021-01-10 ENCOUNTER — Other Ambulatory Visit: Payer: Self-pay | Admitting: Cardiovascular Disease

## 2021-01-12 ENCOUNTER — Ambulatory Visit (INDEPENDENT_AMBULATORY_CARE_PROVIDER_SITE_OTHER): Payer: Medicare Other

## 2021-01-12 ENCOUNTER — Other Ambulatory Visit: Payer: Self-pay

## 2021-01-12 DIAGNOSIS — Z23 Encounter for immunization: Secondary | ICD-10-CM | POA: Diagnosis not present

## 2021-01-12 NOTE — Progress Notes (Signed)
Pt was given HD flu vacc w/o any complications. 

## 2021-01-14 ENCOUNTER — Encounter: Payer: Self-pay | Admitting: Internal Medicine

## 2021-01-19 ENCOUNTER — Encounter (INDEPENDENT_AMBULATORY_CARE_PROVIDER_SITE_OTHER): Payer: Medicare Other | Admitting: Ophthalmology

## 2021-01-19 ENCOUNTER — Other Ambulatory Visit: Payer: Self-pay

## 2021-01-19 DIAGNOSIS — I10 Essential (primary) hypertension: Secondary | ICD-10-CM

## 2021-01-19 DIAGNOSIS — H43813 Vitreous degeneration, bilateral: Secondary | ICD-10-CM | POA: Diagnosis not present

## 2021-01-19 DIAGNOSIS — H35033 Hypertensive retinopathy, bilateral: Secondary | ICD-10-CM

## 2021-01-19 DIAGNOSIS — H35372 Puckering of macula, left eye: Secondary | ICD-10-CM | POA: Diagnosis not present

## 2021-01-19 LAB — HM DIABETES EYE EXAM

## 2021-02-11 ENCOUNTER — Other Ambulatory Visit: Payer: Self-pay | Admitting: Cardiovascular Disease

## 2021-02-12 ENCOUNTER — Telehealth: Payer: Self-pay | Admitting: Cardiovascular Disease

## 2021-02-12 ENCOUNTER — Other Ambulatory Visit: Payer: Self-pay

## 2021-02-12 MED ORDER — EZETIMIBE 10 MG PO TABS: 10.0000 mg | ORAL_TABLET | Freq: Every day | ORAL | 3 refills | Status: DC

## 2021-02-12 NOTE — Telephone Encounter (Signed)
Pt c/o medication issue:  1. Name of Medication: all medications   2. How are you currently taking this medication (dosage and times per day)? N/a  3. Are you having a reaction (difficulty breathing--STAT)? N/a  4. What is your medication issue? Pt reaching out to inform us that as of March 15, 2021 all of his medications will be mailed to him via [place].. Pharmacy will also send over notification of this

## 2021-02-12 NOTE — Telephone Encounter (Signed)
Pt's pharmacy was changed to [facility] order pharmacy

## 2021-02-13 ENCOUNTER — Other Ambulatory Visit: Payer: Self-pay | Admitting: *Deleted

## 2021-02-13 ENCOUNTER — Other Ambulatory Visit: Payer: Self-pay | Admitting: Pharmacist

## 2021-02-13 MED ORDER — PRALUENT 75 MG/ML ~~LOC~~ SOAJ: SUBCUTANEOUS | 3 refills | Status: DC

## 2021-02-13 MED ORDER — AMLODIPINE BESYLATE 5 MG PO TABS: 5.0000 mg | ORAL_TABLET | Freq: Every day | ORAL | 3 refills | Status: DC

## 2021-02-13 MED ORDER — METOPROLOL SUCCINATE ER 50 MG PO TB24
50.0000 mg | ORAL_TABLET | Freq: Every day | ORAL | 3 refills | Status: DC
Start: 2021-02-13 — End: 2022-03-16

## 2021-02-16 ENCOUNTER — Other Ambulatory Visit: Payer: Self-pay | Admitting: Pharmacist

## 2021-02-16 ENCOUNTER — Other Ambulatory Visit: Payer: Self-pay | Admitting: *Deleted

## 2021-02-16 MED ORDER — PRALUENT 75 MG/ML ~~LOC~~ SOAJ: SUBCUTANEOUS | 3 refills | Status: DC

## 2021-02-16 MED ORDER — ROSUVASTATIN CALCIUM 10 MG PO TABS: 10.0000 mg | ORAL_TABLET | ORAL | 2 refills | Status: DC

## 2021-03-15 ENCOUNTER — Other Ambulatory Visit: Payer: Self-pay | Admitting: Internal Medicine

## 2021-03-15 DIAGNOSIS — E118 Type 2 diabetes mellitus with unspecified complications: Secondary | ICD-10-CM

## 2021-03-19 ENCOUNTER — Telehealth: Payer: Self-pay

## 2021-03-19 MED ORDER — REPATHA SURECLICK 140 MG/ML ~~LOC~~ SOAJ: 140.0000 mg | SUBCUTANEOUS | 11 refills | Status: DC

## 2021-03-19 NOTE — Telephone Encounter (Signed)
Called and lmom pt that the praluent isn't covered by insurance and that they have to switch to repatha. Rx sent to [facility], instructed the pt to call if unaffordable.

## 2021-03-25 ENCOUNTER — Encounter: Payer: Self-pay | Admitting: Internal Medicine

## 2021-03-25 LAB — HM DIABETES EYE EXAM

## 2021-04-08 ENCOUNTER — Other Ambulatory Visit: Payer: Medicare PPO | Admitting: *Deleted

## 2021-04-08 ENCOUNTER — Other Ambulatory Visit: Payer: Self-pay

## 2021-04-08 DIAGNOSIS — E782 Mixed hyperlipidemia: Secondary | ICD-10-CM

## 2021-04-08 LAB — LIPID PANEL
Chol/HDL Ratio: 2.2 ratio (ref 0.0–5.0)
Cholesterol, Total: 112 mg/dL (ref 100–199)
HDL: 51 mg/dL (ref >39)
LDL Chol Calc (NIH): 46 mg/dL (ref 0–99)
Triglycerides: 71 mg/dL (ref 0–149)
VLDL Cholesterol Cal: 15 mg/dL (ref 5–40)

## 2021-06-08 ENCOUNTER — Ambulatory Visit: Payer: Medicare PPO

## 2021-06-10 ENCOUNTER — Ambulatory Visit: Payer: Self-pay

## 2021-06-12 ENCOUNTER — Ambulatory Visit (INDEPENDENT_AMBULATORY_CARE_PROVIDER_SITE_OTHER): Payer: Medicare PPO

## 2021-06-12 VITALS — BP 128/80 | HR 71 | Temp 98.0°F | Resp 16 | Ht 71.0 in | Wt 242.0 lb

## 2021-06-12 DIAGNOSIS — Z0000 Encounter for general adult medical examination without abnormal findings: Secondary | ICD-10-CM

## 2021-06-12 NOTE — Patient Instructions (Signed)
Mr. Devon Brady , ?Thank you for taking time to come for your Medicare Wellness Visit. I appreciate your ongoing commitment to your health goals. Please review the following plan we discussed and let me know if I can assist you in the future.  ? ?Screening recommendations/referrals: ?Colonoscopy: 02/20/2016; due every 10 years ?Recommended yearly ophthalmology/optometry visit for glaucoma screening and checkup ?Recommended yearly dental visit for hygiene and checkup ? ?Vaccinations: ?Influenza vaccine: 01/12/2021 ?Pneumococcal vaccine: 02/13/2014, 04/25/2017 ?Tdap vaccine: 03/14/2012; due every 10 years ?Shingles vaccine: 01/15/2021, 04/01/2021   ?Covid-19: 03/13/2019, 04/10/2019, 12/04/2019, 07/22/2020, 01/15/2021 ? ?Advanced directives: Please bring a copy of your health care power of attorney and living will to the office at your convenience. ? ?Conditions/risks identified: Yes ? ?Next appointment: Please schedule your next Medicare Wellness Visit with your Nurse Health Advisor in 1 year or 366 days by calling [phone]. ? ?Preventive Care 61 Years and Older, Male ?Preventive care refers to lifestyle choices and visits with your health care provider that can promote health and wellness. ?What does preventive care include? ?A yearly physical exam. This is also called an annual well check. ?Dental exams once or twice a year. ?Routine eye exams. Ask your health care provider how often you should have your eyes checked. ?Personal lifestyle choices, including: ?Daily care of your teeth and gums. ?Regular physical activity. ?Eating a healthy diet. ?Avoiding tobacco and drug use. ?Limiting alcohol use. ?Practicing safe sex. ?Taking low doses of aspirin every day. ?Taking vitamin and mineral supplements as recommended by your health care provider. ?What happens during an annual well check? ?The services and screenings done by your health care provider during your annual well check will depend on your age, overall health,  lifestyle risk factors, and family history of disease. ?Counseling  ?Your health care provider may ask you questions about your: ?Alcohol use. ?Tobacco use. ?Drug use. ?Emotional well-being. ?Home and relationship well-being. ?Sexual activity. ?Eating habits. ?History of falls. ?Memory and ability to understand (cognition). ?Work and work Statistician. ?Screening  ?You may have the following tests or measurements: ?Height, weight, and BMI. ?Blood pressure. ?Lipid and cholesterol levels. These may be checked every 5 years, or more frequently if you are over 42 years old. ?Skin check. ?Lung cancer screening. You may have this screening every year starting at age 85 if you have a 30-pack-year history of smoking and currently smoke or have quit within the past 15 years. ?Fecal occult blood test (FOBT) of the stool. You may have this test every year starting at age 28. ?Flexible sigmoidoscopy or colonoscopy. You may have a sigmoidoscopy every 5 years or a colonoscopy every 10 years starting at age 100. ?Prostate cancer screening. Recommendations will vary depending on your family history and other risks. ?Hepatitis C blood test. ?Hepatitis B blood test. ?Sexually transmitted disease (STD) testing. ?Diabetes screening. This is done by checking your blood sugar (glucose) after you have not eaten for a while (fasting). You may have this done every 1-3 years. ?Abdominal aortic aneurysm (AAA) screening. You may need this if you are a current or former smoker. ?Osteoporosis. You may be screened starting at age 62 if you are at high risk. ?Talk with your health care provider about your test results, treatment options, and if necessary, the need for more tests. ?Vaccines  ?Your health care provider may recommend certain vaccines, such as: ?Influenza vaccine. This is recommended every year. ?Tetanus, diphtheria, and acellular pertussis (Tdap, Td) vaccine. You may need a Td booster every 10 years. ?Zoster  vaccine. You may need this  after age 40. ?Pneumococcal 13-valent conjugate (PCV13) vaccine. One dose is recommended after age 8. ?Pneumococcal polysaccharide (PPSV23) vaccine. One dose is recommended after age 56. ?Talk to your health care provider about which screenings and vaccines you need and how often you need them. ?This information is not intended to replace advice given to you by your health care provider. Make sure you discuss any questions you have with your health care provider. ?Document Released: 03/28/2015 Document Revised: 11/19/2015 Document Reviewed: 12/31/2014 ?Elsevier Interactive Patient Education ? 2017 [place]. ? ?Fall Prevention in the Home ?Falls can cause injuries. They can happen to people of all ages. There are many things you can do to make your home safe and to help prevent falls. ?What can I do on the outside of my home? ?Regularly fix the edges of walkways and driveways and fix any cracks. ?Remove anything that might make you trip as you walk through a door, such as a raised step or threshold. ?Trim any bushes or trees on the path to your home. ?Use bright outdoor lighting. ?Clear any walking paths of anything that might make someone trip, such as rocks or tools. ?Regularly check to see if handrails are loose or broken. Make sure that both sides of any steps have handrails. ?Any raised decks and porches should have guardrails on the edges. ?Have any leaves, snow, or ice cleared regularly. ?Use sand or salt on walking paths during winter. ?Clean up any spills in your garage right away. This includes oil or grease spills. ?What can I do in the bathroom? ?Use night lights. ?Install grab bars by the toilet and in the tub and shower. Do not use towel bars as grab bars. ?Use non-skid mats or decals in the tub or shower. ?If you need to sit down in the shower, use a plastic, non-slip stool. ?Keep the floor dry. Clean up any water that spills on the floor as soon as it happens. ?Remove soap buildup in the tub or  shower regularly. ?Attach bath mats securely with double-sided non-slip rug tape. ?Do not have throw rugs and other things on the floor that can make you trip. ?What can I do in the bedroom? ?Use night lights. ?Make sure that you have a light by your bed that is easy to reach. ?Do not use any sheets or blankets that are too big for your bed. They should not hang down onto the floor. ?Have a firm chair that has side arms. You can use this for support while you get dressed. ?Do not have throw rugs and other things on the floor that can make you trip. ?What can I do in the kitchen? ?Clean up any spills right away. ?Avoid walking on wet floors. ?Keep items that you use a lot in easy-to-reach places. ?If you need to reach something above you, use a strong step stool that has a grab bar. ?Keep electrical cords out of the way. ?Do not use floor polish or wax that makes floors slippery. If you must use wax, use non-skid floor wax. ?Do not have throw rugs and other things on the floor that can make you trip. ?What can I do with my stairs? ?Do not leave any items on the stairs. ?Make sure that there are handrails on both sides of the stairs and use them. Fix handrails that are broken or loose. Make sure that handrails are as long as the stairways. ?Check any carpeting to make sure that it  is firmly attached to the stairs. Fix any carpet that is loose or worn. ?Avoid having throw rugs at the top or bottom of the stairs. If you do have throw rugs, attach them to the floor with carpet tape. ?Make sure that you have a light switch at the top of the stairs and the bottom of the stairs. If you do not have them, ask someone to add them for you. ?What else can I do to help prevent falls? ?Wear shoes that: ?Do not have high heels. ?Have rubber bottoms. ?Are comfortable and fit you well. ?Are closed at the toe. Do not wear sandals. ?If you use a stepladder: ?Make sure that it is fully opened. Do not climb a closed stepladder. ?Make  sure that both sides of the stepladder are locked into place. ?Ask someone to hold it for you, if possible. ?Clearly mark and make sure that you can see: ?Any grab bars or handrails. ?First and last steps. ?Whe

## 2021-06-12 NOTE — Progress Notes (Signed)
? ?Subjective:  ? Devon Brady is a 71 y.o. male who presents for Medicare Annual/Subsequent preventive examination. ? ?Review of Systems    ? ?Cardiac Risk Factors include: advanced age (>57mn, >>25women);diabetes mellitus;dyslipidemia;family history of premature cardiovascular disease;hypertension;male gender;obesity (BMI >30kg/m2) ? ?   ?Objective:  ?  ?Today's Vitals  ? 06/12/21 0831  ?BP: 128/80  ?Pulse: 71  ?Resp: 16  ?Temp: 98 ?F (36.7 ?C)  ?SpO2: 98%  ?Weight: 242 lb (109.8 kg)  ?Height: '5\' 11"'$  (1.803 m)  ?PainSc: 0-No pain  ? ?Body mass index is 33.75 kg/m?. ? ? ?  06/12/2021  ?  8:50 AM 06/09/2020  ?  2:32 PM 10/31/2015  ?  8:49 AM 11/06/2014  ?  9:02 AM 12/03/2013  ?  3:32 PM 10/03/2013  ?  3:00 PM 10/02/2013  ?  8:16 PM  ?Advanced Directives  ?Does Patient Have a Medical Advance Directive? Yes Yes Yes Yes Yes Patient has advance directive, copy in chart Patient does not have advance directive;Patient would like information  ?Type of Advance Directive Living will;Healthcare Power of Attorney Living will;Healthcare Power of [place] Living will [place] will   ?Does patient want to make changes to medical advance directive? No - Patient declined No - Patient declined Yes - information given      ?Copy of [place] Chart? No - copy requested No - copy requested No - copy requested No - copy requested No - copy requested    ?Would patient like information on creating a medical advance directive?       Advance directive packet given  ?Pre-existing out of facility DNR order (yellow form or pink MOST form)       No  ? ? ?Current Medications (verified) ?Outpatient Encounter Medications as of 06/12/2021  ?Medication Sig  ? amLODipine (NORVASC) 5 MG tablet Take 1 tablet (5 mg total) by mouth daily.  ? aspirin EC 81 MG tablet Take 81 mg by mouth every evening.   ? Evolocumab (REPATHA  SURECLICK) 1937MG/ML SOAJ Inject 140 mg into the skin every 14 (fourteen) days.  ? ezetimibe (ZETIA) 10 MG tablet Take 1 tablet (10 mg total) by mouth daily.  ? FARXIGA 10 MG TABS tablet TAKE 1 TABLET BY MOUTH DAILY BEFORE BREAKFAST.  ? metoprolol succinate (TOPROL-XL) 50 MG 24 hr tablet Take 1 tablet (50 mg total) by mouth daily. Take with or immediately following a meal.  ? rosuvastatin (CRESTOR) 10 MG tablet Take 1 tablet (10 mg total) by mouth 3 (three) times a week.  ? [DISCONTINUED] citalopram (CELEXA) 20 MG tablet Take 1 tablet (20 mg total) by mouth daily.  ? [DISCONTINUED] omeprazole (PRILOSEC OTC) 20 MG tablet Take 1 tablet (20 mg total) by mouth daily.  ? ?No facility-administered encounter medications on file as of 06/12/2021.  ? ? ?Allergies (verified) ?Crestor [rosuvastatin]  ? ?History: ?Past Medical History:  ?Diagnosis Date  ? CAD (coronary artery disease)   ? H/O colonoscopy 2013  ? H/O hiatal hernia   ? Hemorrhoid   ? History of colon polyps   ? Hodgkin's [place] ([place]   ? Hydronephrosis   ? Hyperlipidemia   ? Hypertension   ? Hypothyroidism   ? Myocardial infarction ([facility] 2011  ? "mild one, before the CABG"  ? Neuropathy due to chemotherapeutic drug ([place]   ? GRADE 2  ? Nocturia   ? 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 s/p total thyroidectomy  ? Renal insufficiency   ? Sleep apnea   ? No CPAP  ? TIA (transient ischemic attack) 2011  ? ?Past Surgical History:  ?Procedure Laterality Date  ? BONE MARROW ASPIRATE,BIOPSY, AND CLOT  06/11/11  ? LEFT ILIAC CREAST  ? BUNIONECTOMY Right 1978  ? foot  ? CARDIAC CATHETERIZATION  10/15/2009  ? COLONOSCOPY    ? CORONARY ARTERY BYPASS GRAFT  10/17/2009  ? LIMA to LAD,SVG to Ramus,SVG to OM sequential to OM2, SVG to marginal of RCA  ? ESOPHAGOGASTRODUODENOSCOPY    ? LEFT HEART CATHETERIZATION WITH CORONARY/GRAFT ANGIOGRAM N/A 10/03/2013  ? Procedure: LEFT HEART CATHETERIZATION WITH Beatrix Fetters;  Surgeon: Sinclair Grooms, MD;  Location: [facility] CATH  LAB;  Service: Cardiovascular;  Laterality: N/A;  ? LYMPH NODE BIOPSY  06/07/11  ? RIGHT INGUINAL NODE: CLASSICAL HODGKIN'S LYMPHOMA, NODULAR SCLEROSIS TYPE  ? PORT-A-CATH REMOVAL Left 12/06/2013  ? Procedure: REMOVAL PORT-A-CATH;  Surgeon: [name], MD;  Location: [place];  Service: General;  Laterality: Left;  ? PORTACATH PLACEMENT  06/10/2011  ? Procedure: INSERTION PORT-A-CATH;  Surgeon: [name], MD;  Location: [facility];  Service: General;  Laterality: Left;  subclavian ?  ? THYROIDECTOMY  03/31/2012  ? Procedure: THYROIDECTOMY;  Surgeon: Izora Gala, MD;  Location: [place];  Service: ENT;  Laterality: N/A;  ? TONSILLECTOMY    ? as a child  ? VASECTOMY  1980  ? ?Family History  ?Problem Relation Age of Onset  ? Arthritis Mother   ? Hypertension Mother   ? Diabetes Father   ? Coronary artery disease Father 57  ?     Died age 63  ? Kidney disease Father   ? Hypertension Sister   ? Hypertension Sister   ?     2nd Sister  ? Alcohol abuse Brother   ? Prostate cancer Paternal Uncle   ?     Great  ? Cancer Neg Hx   ? Drug abuse Neg Hx   ? Early death Neg Hx   ? Stroke Neg Hx   ? ?Social History  ? ?Socioeconomic History  ? Marital status: Married  ?  Spouse name: Not on file  ? Number of children: 2  ? Years of education: Not on file  ? Highest education level: Not on file  ?Occupational History  ? Occupation: REGIONAL SPECIALIST  ?  Employer: [employer]  ?  Comment: Environmental Health.   ?Tobacco Use  ? Smoking status: Never  ? Smokeless tobacco: Never  ?Substance and Sexual Activity  ? Alcohol use: Yes  ?  Comment: 10/02/2013 "couple beers; couple times/month"  ? Drug use: No  ? Sexual activity: Never  ?Other Topics Concern  ? Not on file  ?Social History Narrative  ? Not on file  ? ?Social Determinants of Health  ? ?Financial Resource Strain: Low Risk   ? Difficulty of Paying Living Expenses: Not hard at all  ?Food Insecurity: No Food Insecurity  ? Worried About Charity fundraiser in the  Last Year: Never true  ? Ran Out of Food in the Last Year: Never true  ?Transportation Needs: No Transportation Needs  ? Lack of Transportation (Medical): No  ? Lack of Transportation (Non-Medical): No  ?Physical Activity: Sufficiently Active  ? Days of Exercise per Week: 5 days  ? Minutes of Exercise per Session: 60 min  ?Stress: No Stress Concern Present  ? Feeling  of Stress : Not at all  ?Social Connections: Socially Integrated  ? Frequency of Communication with Friends and Family: More than three times a week  ? Frequency of Social Gatherings with Friends and Family: Once a week  ? Attends Religious Services: More than 4 times per year  ? Active Member of Clubs or Organizations: No  ? Attends Archivist Meetings: More than 4 times per year  ? Marital Status: Married  ? ? ?Tobacco Counseling ?Counseling given: Not Answered ? ? ?Clinical Intake: ? ?Pre-visit preparation completed: Yes ? ?Pain : No/denies pain ?Pain Score: 0-No pain ? ?  ? ?BMI - recorded: 33.75 ?Nutritional Status: BMI > 30  Obese ?Nutritional Risks: None ?Diabetes: Yes ?CBG done?: No ?Did pt. bring in CBG monitor from home?: No ? ?How often do you need to have someone help you when you read instructions, pamphlets, or other written materials from your doctor or pharmacy?: 1 - Never ?What is the last grade level you completed in school?: College Graduate ? ?Diabetic? yes ? ?Interpreter Needed?: No ? ?Information entered by :: Lisette Abu, LPN ? ? ?Activities of Daily Living ? ?  06/12/2021  ?  8:38 AM  ?In your present state of health, do you have any difficulty performing the following activities:  ?Hearing? 0  ?Vision? 0  ?Difficulty concentrating or making decisions? 0  ?Walking or climbing stairs? 0  ?Dressing or bathing? 0  ?Doing errands, shopping? 0  ?Preparing Food and eating ? N  ?Using the Toilet? N  ?In the past six months, have you accidently leaked urine? N  ?Do you have problems with loss of bowel control? N  ?Managing  your Medications? N  ?Managing your Finances? N  ?Housekeeping or managing your Housekeeping? N  ? ? ?Patient Care Team: ?Janith Lima, MD as PCP - General (Internal Medicine) ?Burnell Blanks

## 2021-06-17 ENCOUNTER — Other Ambulatory Visit: Payer: Self-pay | Admitting: *Deleted

## 2021-06-17 MED ORDER — REPATHA SURECLICK 140 MG/ML ~~LOC~~ SOAJ: 140.0000 mg | SUBCUTANEOUS | 11 refills | Status: DC

## 2021-07-06 ENCOUNTER — Ambulatory Visit (INDEPENDENT_AMBULATORY_CARE_PROVIDER_SITE_OTHER): Payer: Medicare PPO | Admitting: Internal Medicine

## 2021-07-06 ENCOUNTER — Encounter: Payer: Self-pay | Admitting: Internal Medicine

## 2021-07-06 VITALS — BP 126/84 | HR 67 | Temp 98.3°F | Ht 71.0 in | Wt 245.0 lb

## 2021-07-06 DIAGNOSIS — E890 Postprocedural hypothyroidism: Secondary | ICD-10-CM | POA: Diagnosis not present

## 2021-07-06 DIAGNOSIS — E1129 Type 2 diabetes mellitus with other diabetic kidney complication: Secondary | ICD-10-CM

## 2021-07-06 DIAGNOSIS — N5201 Erectile dysfunction due to arterial insufficiency: Secondary | ICD-10-CM

## 2021-07-06 DIAGNOSIS — E118 Type 2 diabetes mellitus with unspecified complications: Secondary | ICD-10-CM

## 2021-07-06 DIAGNOSIS — N1832 Chronic kidney disease, stage 3b: Secondary | ICD-10-CM

## 2021-07-06 DIAGNOSIS — Z0001 Encounter for general adult medical examination with abnormal findings: Secondary | ICD-10-CM

## 2021-07-06 DIAGNOSIS — R809 Proteinuria, unspecified: Secondary | ICD-10-CM

## 2021-07-06 DIAGNOSIS — N400 Enlarged prostate without lower urinary tract symptoms: Secondary | ICD-10-CM | POA: Diagnosis not present

## 2021-07-06 DIAGNOSIS — I10 Essential (primary) hypertension: Secondary | ICD-10-CM | POA: Diagnosis not present

## 2021-07-06 DIAGNOSIS — M25532 Pain in left wrist: Secondary | ICD-10-CM

## 2021-07-06 DIAGNOSIS — M25531 Pain in right wrist: Secondary | ICD-10-CM

## 2021-07-06 DIAGNOSIS — G8929 Other chronic pain: Secondary | ICD-10-CM

## 2021-07-06 LAB — URINALYSIS, ROUTINE W REFLEX MICROSCOPIC
Bilirubin Urine: NEGATIVE
Hgb urine dipstick: NEGATIVE
Ketones, ur: NEGATIVE
Leukocytes,Ua: NEGATIVE
Nitrite: NEGATIVE
RBC / HPF: NONE SEEN (ref 0–?)
Specific Gravity, Urine: 1.02 (ref 1.000–1.030)
Urine Glucose: NEGATIVE
Urobilinogen, UA: 0.2 (ref 0.0–1.0)
pH: 6 (ref 5.0–8.0)

## 2021-07-06 LAB — BASIC METABOLIC PANEL
BUN: 21 mg/dL (ref 6–23)
CO2: 27 mEq/L (ref 19–32)
Calcium: 9.6 mg/dL (ref 8.4–10.5)
Chloride: 108 mEq/L (ref 96–112)
Creatinine, Ser: 1.46 mg/dL (ref 0.40–1.50)
GFR: 48.69 mL/min — ABNORMAL LOW (ref >60.00)
Glucose, Bld: 104 mg/dL — ABNORMAL HIGH (ref 70–99)
Potassium: 4.4 mEq/L (ref 3.5–5.1)
Sodium: 143 mEq/L (ref 135–145)

## 2021-07-06 LAB — CBC WITH DIFFERENTIAL/PLATELET
Basophils Absolute: 0 10*3/uL (ref 0.0–0.1)
Basophils Relative: 0.6 % (ref 0.0–3.0)
Eosinophils Absolute: 0.1 10*3/uL (ref 0.0–0.7)
Eosinophils Relative: 2.4 % (ref 0.0–5.0)
HCT: 47 % (ref 39.0–52.0)
Hemoglobin: 15.7 g/dL (ref 13.0–17.0)
Lymphocytes Relative: 35 % (ref 12.0–46.0)
Lymphs Abs: 2 10*3/uL (ref 0.7–4.0)
MCHC: 33.4 g/dL (ref 30.0–36.0)
MCV: 88.2 fl (ref 78.0–100.0)
Monocytes Absolute: 0.5 10*3/uL (ref 0.1–1.0)
Monocytes Relative: 8 % (ref 3.0–12.0)
Neutro Abs: 3.1 10*3/uL (ref 1.4–7.7)
Neutrophils Relative %: 54 % (ref 43.0–77.0)
Platelets: 204 10*3/uL (ref 150.0–400.0)
RBC: 5.32 Mil/uL (ref 4.22–5.81)
RDW: 15.5 % (ref 11.5–15.5)
WBC: 5.8 10*3/uL (ref 4.0–10.5)

## 2021-07-06 LAB — TSH: TSH: 0.68 u[IU]/mL (ref 0.35–5.50)

## 2021-07-06 LAB — HEMOGLOBIN A1C: Hgb A1c MFr Bld: 6.8 % — ABNORMAL HIGH (ref 4.6–6.5)

## 2021-07-06 LAB — MICROALBUMIN / CREATININE URINE RATIO
Creatinine,U: 119.4 mg/dL
Microalb Creat Ratio: 14.2 mg/g (ref 0.0–30.0)
Microalb, Ur: 16.9 mg/dL — ABNORMAL HIGH (ref 0.0–1.9)

## 2021-07-06 LAB — PSA: PSA: 2.34 ng/mL (ref 0.10–4.00)

## 2021-07-06 MED ORDER — DAPAGLIFLOZIN PROPANEDIOL 10 MG PO TABS: 10.0000 mg | ORAL_TABLET | Freq: Every day | ORAL | 1 refills | Status: DC

## 2021-07-06 NOTE — Progress Notes (Signed)
? ?Subjective:  ?Patient ID: Devon Brady, male    DOB: [birth year]  Age: 71 y.o. MRN: [id number] ? ?CC: Annual Exam, Diabetes, Hyperlipidemia, and Hypothyroidism ? ? ?HPI ?Devon Brady presents for a CPX and f/up -  ? ?He complains of chronic, bilateral wrist pain but no swelling.  He says none of his other joints bother him.  He complains of ED and would like to see a urologist to consider treatment options.  He is active and denies chest pain, shortness of breath, diaphoresis, dizziness, lightheadedness, or edema. ? ?Outpatient Medications Prior to Visit  ?Medication Sig Dispense Refill  ? amLODipine (NORVASC) 5 MG tablet Take 1 tablet (5 mg total) by mouth daily. 90 tablet 3  ? aspirin EC 81 MG tablet Take 81 mg by mouth every evening.     ? Evolocumab (REPATHA SURECLICK) 732 MG/ML SOAJ Inject 140 mg into the skin every 14 (fourteen) days. 2 mL 11  ? ezetimibe (ZETIA) 10 MG tablet Take 1 tablet (10 mg total) by mouth daily. 90 tablet 3  ? metoprolol succinate (TOPROL-XL) 50 MG 24 hr tablet Take 1 tablet (50 mg total) by mouth daily. Take with or immediately following a meal. 90 tablet 3  ? rosuvastatin (CRESTOR) 10 MG tablet Take 1 tablet (10 mg total) by mouth 3 (three) times a week. 36 tablet 2  ? FARXIGA 10 MG TABS tablet TAKE 1 TABLET BY MOUTH DAILY BEFORE BREAKFAST. 90 tablet 0  ? ?No facility-administered medications prior to visit.  ? ? ?ROS ?Review of Systems  ?Constitutional:  Positive for unexpected weight change (wt gain). Negative for chills, diaphoresis, fatigue and fever.  ?HENT: Negative.    ?Eyes: Negative.   ?Respiratory:  Negative for cough, chest tightness, shortness of breath and wheezing.   ?Cardiovascular:  Negative for chest pain, palpitations and leg swelling.  ?Gastrointestinal:  Negative for abdominal pain, constipation, diarrhea, nausea and vomiting.  ?Endocrine: Negative.   ?Genitourinary: Negative.  Negative for difficulty urinating.  ?Musculoskeletal:  Positive for  arthralgias. Negative for back pain and myalgias.  ?Skin: Negative.   ?Neurological: Negative.  Negative for dizziness and weakness.  ?Hematological:  Negative for adenopathy. Does not bruise/bleed easily.  ?Psychiatric/Behavioral: Negative.    ? ?Objective:  ?BP 126/84 (BP Location: Right Arm, Patient Position: Sitting, Cuff Size: Large)   Pulse 67   Temp 98.3 ?F (36.8 ?C) (Oral)   Ht '5\' 11"'$  (1.803 m)   Wt 245 lb (111.1 kg)   SpO2 98%   BMI 34.17 kg/m?  ? ?BP Readings from Last 3 Encounters:  ?07/06/21 126/84  ?06/12/21 128/80  ?12/31/20 110/72  ? ? ?Wt Readings from Last 3 Encounters:  ?07/06/21 245 lb (111.1 kg)  ?06/12/21 242 lb (109.8 kg)  ?12/31/20 233 lb 12.8 oz (106.1 kg)  ? ? ?Physical Exam ?Vitals reviewed.  ?Constitutional:   ?   Appearance: He is obese.  ?HENT:  ?   Nose: Nose normal.  ?   Mouth/Throat:  ?   Mouth: Mucous membranes are moist.  ?Eyes:  ?   General: No scleral icterus. ?   Conjunctiva/sclera: Conjunctivae normal.  ?Cardiovascular:  ?   Rate and Rhythm: Normal rate and regular rhythm.  ?   Heart sounds: No murmur heard. ?Pulmonary:  ?   Effort: Pulmonary effort is normal.  ?   Breath sounds: No stridor. No wheezing, rhonchi or rales.  ?Abdominal:  ?   General: Abdomen is flat.  ?   Palpations: There is  no mass.  ?   Tenderness: There is no abdominal tenderness. There is no guarding.  ?   Hernia: No hernia is present.  ?Musculoskeletal:     ?   General: No swelling, tenderness or deformity. Normal range of motion.  ?   Cervical back: Neck supple.  ?   Right lower leg: No edema.  ?   Left lower leg: No edema.  ?Lymphadenopathy:  ?   Cervical: No cervical adenopathy.  ?Skin: ?   General: Skin is warm and dry.  ?Neurological:  ?   General: No focal deficit present.  ?   Mental Status: He is alert. Mental status is at baseline.  ?Psychiatric:     ?   Mood and Affect: Mood normal.     ?   Behavior: Behavior normal.  ? ? ?Lab Results  ?Component Value Date  ? WBC 5.8 07/06/2021  ? HGB 15.7  07/06/2021  ? HCT 47.0 07/06/2021  ? PLT 204.0 07/06/2021  ? GLUCOSE 104 (H) 07/06/2021  ? CHOL 112 04/08/2021  ? TRIG 71 04/08/2021  ? HDL 51 04/08/2021  ? [place] 46 04/08/2021  ? ALT 11 12/31/2020  ? AST 21 12/31/2020  ? NA 143 07/06/2021  ? K 4.4 07/06/2021  ? CL 108 07/06/2021  ? CREATININE 1.46 07/06/2021  ? BUN 21 07/06/2021  ? CO2 27 07/06/2021  ? TSH 0.68 07/06/2021  ? PSA 2.34 07/06/2021  ? INR 0.97 10/02/2013  ? HGBA1C 6.8 (H) 07/06/2021  ? MICROALBUR 16.9 (H) 07/06/2021  ? ? ?DG Chest 2 View ? ?Result Date: 10/23/2019 ?CLINICAL DATA:  Cough and shortness of breath for 2 weeks. Coronary artery disease. EXAM: CHEST - 2 VIEW COMPARISON:  01/26/2017 FINDINGS: The heart size and mediastinal contours are within normal limits. 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 Both lungs are clear. The visualized skeletal structures are unremarkable. IMPRESSION: No active cardiopulmonary disease. Electronically Signed   By: Marlaine Hind M.D.   On: 10/23/2019 17:11  ? ? ?Assessment & Plan:  ? ?Devon Brady was seen today for annual exam, diabetes, hyperlipidemia and hypothyroidism. ? ?Diagnoses and all orders for this visit: ? ?Essential hypertension- His blood pressure is adequately well controlled. ?-     Basic metabolic panel; Future ?-     CBC with Differential/Platelet; Future ?-     Urinalysis, Routine w reflex microscopic; Future ?-     Urinalysis, Routine w reflex microscopic ?-     CBC with Differential/Platelet ?-     Basic metabolic panel ? ?Microalbuminuria due to type 2 diabetes mellitus ([place])- Will continue to address risk factor modifications. ?-     Microalbumin / creatinine urine ratio; Future ?-     Microalbumin / creatinine urine ratio ? ?Type II diabetes mellitus with manifestations ([place])- His blood sugar is adequately well controlled. ?-     Basic metabolic panel; Future ?-     Microalbumin / creatinine urine ratio; Future ?-     Hemoglobin A1c; Future ?-     Hemoglobin A1c ?-     Microalbumin / creatinine urine ratio ?-      Basic metabolic panel ?-     dapagliflozin propanediol (FARXIGA) 10 MG TABS tablet; Take 1 tablet (10 mg total) by mouth daily before breakfast. ? ?Hypothyroidism, postsurgical- His TSH is in the normal range.  He will stay on the current T4 dosage. ?-     TSH; Future ?-     TSH ? ?Stage 3b chronic kidney disease (  [place]) ?-     dapagliflozin propanediol (FARXIGA) 10 MG TABS tablet; Take 1 tablet (10 mg total) by mouth daily before breakfast. ? ?Benign prostatic hyperplasia without lower urinary tract symptoms- His PSA is reassuring. ?-     PSA; Future ?-     Urinalysis, Routine w reflex microscopic; Future ?-     Urinalysis, Routine w reflex microscopic ?-     PSA ? ?Encounter for general adult medical examination with abnormal findings- Exam completed, labs reviewed, vaccines are up-to-date, cancer screenings are up-to-date, patient education was given. ? ?Chronic pain of both wrists ?-     Ambulatory referral to Orthopedic Surgery ? ?Erectile dysfunction due to arterial insufficiency ?-     Ambulatory referral to Urology ? ? ?I have changed Devon Brady's Farxiga to dapagliflozin propanediol. I am also having him maintain his aspirin EC, ezetimibe, amLODipine, metoprolol succinate, rosuvastatin, and Repatha SureClick. ? ?Meds ordered this encounter  ?Medications  ? dapagliflozin propanediol (FARXIGA) 10 MG TABS tablet  ?  Sig: Take 1 tablet (10 mg total) by mouth daily before breakfast.  ?  Dispense:  90 tablet  ?  Refill:  1  ?  DX Code Needed  .  ? ? ? ?Follow-up: Return in about 6 months (around 01/05/2022). ? ?Scarlette Calico, MD ?

## 2021-07-06 NOTE — Patient Instructions (Signed)
Health Maintenance, Male Adopting a healthy lifestyle and getting preventive care are important in promoting health and wellness. Ask your health care provider about: The right schedule for you to have regular tests and exams. Things you can do on your own to prevent diseases and keep yourself healthy. What should I know about diet, weight, and exercise? Eat a healthy diet  Eat a diet that includes plenty of vegetables, fruits, low-fat dairy products, and lean protein. Do not eat a lot of foods that are high in solid fats, added sugars, or sodium. Maintain a healthy weight Body mass index (BMI) is a measurement that can be used to identify possible weight problems. It estimates body fat based on height and weight. Your health care provider can help determine your BMI and help you achieve or maintain a healthy weight. Get regular exercise Get regular exercise. This is one of the most important things you can do for your health. Most adults should: Exercise for at least 150 minutes each week. The exercise should increase your heart rate and make you sweat (moderate-intensity exercise). Do strengthening exercises at least twice a week. This is in addition to the moderate-intensity exercise. Spend less time sitting. Even light physical activity can be beneficial. Watch cholesterol and blood lipids Have your blood tested for lipids and cholesterol at 70 years of age, then have this test every 5 years. You may need to have your cholesterol levels checked more often if: Your lipid or cholesterol levels are high. You are older than 70 years of age. You are at high risk for heart disease. What should I know about cancer screening? Many types of cancers can be detected early and may often be prevented. Depending on your health history and family history, you may need to have cancer screening at various ages. This may include screening for: Colorectal cancer. Prostate cancer. Skin cancer. Lung  cancer. What should I know about heart disease, diabetes, and high blood pressure? Blood pressure and heart disease High blood pressure causes heart disease and increases the risk of stroke. This is more likely to develop in people who have high blood pressure readings or are overweight. Talk with your health care provider about your target blood pressure readings. Have your blood pressure checked: Every 3-5 years if you are 18-39 years of age. Every year if you are 40 years old or older. If you are between the ages of 65 and 75 and are a current or former smoker, ask your health care provider if you should have a one-time screening for abdominal aortic aneurysm (AAA). Diabetes Have regular diabetes screenings. This checks your fasting blood sugar level. Have the screening done: Once every three years after age 45 if you are at a normal weight and have a low risk for diabetes. More often and at a younger age if you are overweight or have a high risk for diabetes. What should I know about preventing infection? Hepatitis B If you have a higher risk for hepatitis B, you should be screened for this virus. Talk with your health care provider to find out if you are at risk for hepatitis B infection. Hepatitis C Blood testing is recommended for: Everyone born from 1945 through 1965. Anyone with known risk factors for hepatitis C. Sexually transmitted infections (STIs) You should be screened each year for STIs, including gonorrhea and chlamydia, if: You are sexually active and are younger than 70 years of age. You are older than 70 years of age and your   health care provider tells you that you are at risk for this type of infection. Your sexual activity has changed since you were last screened, and you are at increased risk for chlamydia or gonorrhea. Ask your health care provider if you are at risk. Ask your health care provider about whether you are at high risk for HIV. Your health care provider  may recommend a prescription medicine to help prevent HIV infection. If you choose to take medicine to prevent HIV, you should first get tested for HIV. You should then be tested every 3 months for as long as you are taking the medicine. Follow these instructions at home: Alcohol use Do not drink alcohol if your health care provider tells you not to drink. If you drink alcohol: Limit how much you have to 0-2 drinks a day. Know how much alcohol is in your drink. In the [place] one drink equals one 12 oz bottle of beer (355 mL), one 5 oz glass of wine (148 mL), or one 1 oz glass of hard liquor (44 mL). Lifestyle Do not use any products that contain nicotine or tobacco. These products include cigarettes, chewing tobacco, and vaping devices, such as e-cigarettes. If you need help quitting, ask your health care provider. Do not use street drugs. Do not share needles. Ask your health care provider for help if you need support or information about quitting drugs. General instructions Schedule regular health, dental, and eye exams. Stay current with your vaccines. Tell your health care provider if: You often feel depressed. You have ever been abused or do not feel safe at home. Summary Adopting a healthy lifestyle and getting preventive care are important in promoting health and wellness. Follow your health care provider's instructions about healthy diet, exercising, and getting tested or screened for diseases. Follow your health care provider's instructions on monitoring your cholesterol and blood pressure. This information is not intended to replace advice given to you by your health care provider. Make sure you discuss any questions you have with your health care provider. Document Revised: 07/21/2020 Document Reviewed: 07/21/2020 Elsevier Patient Education  2023 Elsevier Inc.  

## 2021-08-31 DIAGNOSIS — E785 Hyperlipidemia, unspecified: Secondary | ICD-10-CM | POA: Diagnosis not present

## 2021-08-31 DIAGNOSIS — I10 Essential (primary) hypertension: Secondary | ICD-10-CM | POA: Diagnosis not present

## 2021-08-31 DIAGNOSIS — E039 Hypothyroidism, unspecified: Secondary | ICD-10-CM | POA: Diagnosis not present

## 2021-08-31 DIAGNOSIS — G8929 Other chronic pain: Secondary | ICD-10-CM | POA: Diagnosis not present

## 2021-08-31 DIAGNOSIS — I252 Old myocardial infarction: Secondary | ICD-10-CM | POA: Diagnosis not present

## 2021-08-31 DIAGNOSIS — I2510 Atherosclerotic heart disease of native coronary artery without angina pectoris: Secondary | ICD-10-CM | POA: Diagnosis not present

## 2021-08-31 DIAGNOSIS — E1122 Type 2 diabetes mellitus with diabetic chronic kidney disease: Secondary | ICD-10-CM | POA: Diagnosis not present

## 2021-08-31 DIAGNOSIS — N1831 Chronic kidney disease, stage 3a: Secondary | ICD-10-CM | POA: Diagnosis not present

## 2021-08-31 DIAGNOSIS — E669 Obesity, unspecified: Secondary | ICD-10-CM | POA: Diagnosis not present

## 2021-09-29 ENCOUNTER — Telehealth: Payer: Self-pay | Admitting: Internal Medicine

## 2021-09-29 NOTE — Telephone Encounter (Signed)
Pt is requesting levothyroxine 137 MCG be added to his medication list. He states another provider prescribed the rx for him and they advised him they do not participate in [place], and he would have to call us to get it added.    Please advise

## 2021-09-29 NOTE — Telephone Encounter (Signed)
Rx has been added.

## 2021-10-30 ENCOUNTER — Other Ambulatory Visit: Payer: Self-pay | Admitting: Cardiovascular Disease

## 2021-12-18 ENCOUNTER — Other Ambulatory Visit: Payer: Self-pay | Admitting: Internal Medicine

## 2021-12-18 DIAGNOSIS — E118 Type 2 diabetes mellitus with unspecified complications: Secondary | ICD-10-CM

## 2021-12-18 DIAGNOSIS — N1832 Chronic kidney disease, stage 3b: Secondary | ICD-10-CM

## 2022-01-04 ENCOUNTER — Encounter: Payer: Self-pay | Admitting: Cardiovascular Disease

## 2022-01-04 ENCOUNTER — Ambulatory Visit: Payer: Medicare HMO | Attending: Cardiovascular Disease | Admitting: Cardiovascular Disease

## 2022-01-04 VITALS — BP 122/80 | HR 69 | Ht 71.0 in | Wt 236.0 lb

## 2022-01-04 DIAGNOSIS — I2510 Atherosclerotic heart disease of native coronary artery without angina pectoris: Secondary | ICD-10-CM

## 2022-01-04 DIAGNOSIS — E782 Mixed hyperlipidemia: Secondary | ICD-10-CM

## 2022-01-04 DIAGNOSIS — I10 Essential (primary) hypertension: Secondary | ICD-10-CM | POA: Diagnosis not present

## 2022-01-04 MED ORDER — REPATHA SURECLICK 140 MG/ML ~~LOC~~ SOAJ: 140.0000 mg | SUBCUTANEOUS | 11 refills | Status: DC

## 2022-01-04 NOTE — Patient Instructions (Signed)
Medication Instructions:  Your physician has recommended you make the following change in your medication:  1.) stop Zetia  *If you need a refill on your cardiac medications before your next appointment, please call your pharmacy*   Lab Work: none If you have labs (blood work) drawn today and your tests are completely normal, you will receive your results only by: [place] (if you have MyChart) OR A paper copy in the mail If you have any lab test that is abnormal or we need to change your treatment, we will call you to review the results.   Testing/Procedures: none   Follow-Up: At [facility], you and your health needs are our priority.  As part of our continuing mission to provide you with exceptional heart care, we have created designated Provider Care Teams.  These Care Teams include your primary Cardiologist (physician) and Advanced Practice Providers (APPs -  Physician Assistants and Nurse Practitioners) who all work together to provide you with the care you need, when you need it.   Your next appointment:   12 month(s)  The format for your next appointment:   In Person  Provider:   Lauree Chandler, MD      Important Information About Sugar

## 2022-01-04 NOTE — Progress Notes (Signed)
Chief Complaint  Patient presents with   Follow-up    CAD   History of Present Illness: 71 yo male with history of CAD s/p 5V CABG August 2011, HTN, hyperlipidemia, stage IV Hodgkins lymphoma who is here today for cardiac follow up. Cardiac cath in August 2011 showed severe triple vessel disease. He underwent 5V CABG August 2011. (LIMA to LAD, SVG to Ramus, SVG sequential to OM1/OM2, SVG to RV marginal). He was discovered to have thyroid cancer in 2014 and he underwent a thyroidectomy. Admitted to [facility] 10/02/13 with chest pain and ruled in for MI with elevated troponin. Cardiac cath on 10/03/13 with patent IMA graft to LAD, patent SVG to Ramus, occluded SVG to OM1/OM2 and occluded SVG to PDA. There were no focal targets for PCI. Imdur was added but he did not continue taking this. Exercise stress test normal in 2019. ABI normal March 2016.Marland Kitchen   He is here today for follow up. The patient denies any chest pain, dyspnea, palpitations, lower extremity edema, orthopnea, PND, dizziness, near syncope or syncope.   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   s/p total thyroidectomy   Renal insufficiency    Sleep apnea    No CPAP   TIA (transient ischemic attack) 2011    Past Surgical History:  Procedure Laterality Date   BONE MARROW ASPIRATE,BIOPSY, AND CLOT  06/11/11   LEFT ILIAC CREAST   BUNIONECTOMY Right 1978   foot   CARDIAC CATHETERIZATION  10/15/2009   COLONOSCOPY     CORONARY ARTERY BYPASS GRAFT  10/17/2009   LIMA to LAD,SVG to Ramus,SVG to OM  sequential to OM2, SVG to marginal of RCA   ESOPHAGOGASTRODUODENOSCOPY     LEFT HEART CATHETERIZATION WITH CORONARY/GRAFT ANGIOGRAM N/A 10/03/2013   Procedure: LEFT HEART CATHETERIZATION WITH Beatrix Fetters;  Surgeon: Sinclair Grooms, MD;  Location: [facility] C[facility] LAB;  Service: Cardiovascular;  Laterality: N/A;   LYMPH NODE BIOPSY  06/07/11   RIGHT INGUINAL NODE: CLASSICAL HODGKIN'S LYMPHOMA, NODULAR SCLEROSIS TYPE   PORT-A-CATH REMOVAL Left 12/06/2013   Procedure: REMOVAL PORT-A-CATH;  Surgeon: [name], MD;  Location: [place];  Service: General;  Laterality: Left;   PORTACATH PLACEMENT  06/10/2011   Procedure: INSERTION PORT-A-CATH;  Surgeon: [name], MD;  Location: [facility];  Service: General;  Laterality: Left;  subclavian    THYROIDECTOMY  03/31/2012   Procedure: THYROIDECTOMY;  Surgeon: Izora Gala, MD;  Location: [facility];  Service: ENT;  Laterality: N/A;   TONSILLECTOMY     as a child   VASECTOMY  1980    Current Outpatient Medications  Medication Sig Dispense Refill   amLODipine (NORVASC) 5 MG tablet Take 1 tablet (5 mg total) by mouth daily. 90 tablet 3   aspirin EC 81 MG tablet Take 81 mg by mouth every evening.      dapagliflozin propanediol (FARXIGA) 10 MG TABS tablet Take 1 tablet (10 mg total) by  mouth daily before breakfast. 90 tablet 1   levothyroxine (SYNTHROID) 137 MCG tablet Take 137 mcg by mouth daily.     metoprolol succinate (TOPROL-XL) 50 MG 24 hr tablet Take 1 tablet (50 mg total) by mouth daily. Take with or immediately following a meal. 90 tablet 3   rosuvastatin (CRESTOR) 10 MG tablet TAKE 1 TABLET  THREE  TIMES A WEEK. 36 tablet 0   Evolocumab (REPATHA SURECLICK) 937 MG/ML SOAJ Inject 140 mg into the skin every 14 (fourteen) days. 2 mL 11   No current facility-administered medications for this visit.    Allergies  Allergen Reactions   Crestor [Rosuvastatin]     Muscle aches    Social History   Socioeconomic History   Marital  status: Married    Spouse name: Not on file   Number of children: 2   Years of education: Not on file   Highest education level: Not on file  Occupational History   Occupation: REGIONAL SPECIALIST    Employer: [employer] ([employer])    Comment: Environmental Health.   Tobacco Use   Smoking status: Never   Smokeless tobacco: Never  Substance and Sexual Activity   Alcohol use: Yes    Comment: 10/02/2013 "couple beers; couple times/month"   Drug use: No   Sexual activity: Never  Other Topics Concern   Not on file  Social History Narrative   Not on file   Social Determinants of Health   Financial Resource Strain: Low Risk  (06/12/2021)   Overall Financial Resource Strain (CARDIA)    Difficulty of Paying Living Expenses: Not hard at all  Food Insecurity: No Food Insecurity (06/12/2021)   Hunger Vital Sign    Worried About Running Out of Food in the Last Year: Never true    Ran Out of Food in the Last Year: Never true  Transportation Needs: No Transportation Needs (06/12/2021)   PRAPARE - Hydrologist (Medical): No    Lack of Transportation (Non-Medical): No  Physical Activity: Sufficiently Active (06/12/2021)   Exercise Vital Sign    Days of Exercise per Week: 5 days    Minutes of Exercise per Session: 60 min  Stress: No Stress Concern Present (06/12/2021)   [place]    Feeling of Stress : Not at all  Social Connections: [place] (06/12/2021)   Social Connection and Isolation Panel [NHANES]    Frequency of Communication with Friends and Family: More than three times a week    Frequency of Social Gatherings with Friends and Family: Once a week    Attends Religious Services: More than 4 times per year    Active Member of [employer] or Organizations: No    Attends Music therapist: More than 4 times per year    Marital Status: Married  Human resources officer Violence: Not At Risk  (06/12/2021)   Humiliation, Afraid, Rape, and Kick questionnaire    Fear of Current or Ex-Partner: No    Emotionally Abused: No    Physically Abused: No    Sexually Abused: No    Family History  Problem Relation Age of Onset   Arthritis Mother    Hypertension Mother    Diabetes Father    Coronary artery disease Father 21       Died age 54   Kidney disease Father    Hypertension Sister    Hypertension Sister  2nd Sister   Alcohol abuse Brother    Prostate cancer Paternal Uncle        Great   Cancer Neg Hx    Drug abuse Neg Hx    Early death Neg Hx    Stroke Neg Hx     Review of Systems:  As stated in the HPI and otherwise negative.   BP 122/80   Pulse 69   Ht '5\' 11"'$  (1.803 m)   Wt 236 lb (107 kg)   SpO2 99%   BMI 32.92 kg/m   Physical Examination: General: Well developed, well nourished, NAD  HEENT: OP clear, mucus membranes moist  SKIN: warm, dry. No rashes. Neuro: No focal deficits  Musculoskeletal: Muscle strength 5/5 all ext  Psychiatric: Mood and affect normal  Neck: No JVD, no carotid bruits, no thyromegaly, no lymphadenopathy.  Lungs:Clear bilaterally, no wheezes, rhonci, crackles Cardiovascular: Regular rate and rhythm. No murmurs, gallops or rubs. Abdomen:Soft. Bowel sounds present. Non-tender.  Extremities: No lower extremity edema. Pulses are 2 + in the bilateral DP/PT.  EKG:  EKG is ordered today. The ekg ordered today demonstrates NSR  Recent Labs: 07/06/2021: BUN 21; Creatinine, Ser 1.46; Hemoglobin 15.7; Platelets 204.0; Potassium 4.4; Sodium 143; TSH 0.68   Lipid Panel    Component Value Date/Time   CHOL 112 04/08/2021 0756   TRIG 71 04/08/2021 0756   HDL 51 04/08/2021 0756   CHOLHDL 2.2 04/08/2021 0756   CHOLHDL 2 02/11/2020 1003   VLDL 15.4 02/11/2020 1003   LDLCALC 46 04/08/2021 0756     Wt Readings from Last 3 Encounters:  01/04/22 236 lb (107 kg)  07/06/21 245 lb (111.1 kg)  06/12/21 242 lb (109.8 kg)    Assessment and  Plan:   1. CAD s/p CABG without angina: He has no chest pain. Will continue ASA, statin, Zetia and beta blocker.    2. HTN: BP is well controlled. No changes today  3. HLD: LDL at goal in January 2023. Continue Crestor, Zetia and Repatha.    Labs/ tests ordered today include:   Orders Placed This Encounter  Procedures   EKG 12-Lead   Disposition:   F/U with me in 12 months  Signed, Lauree Chandler, MD 01/04/2022 2:49 PM    [address], [place], [place]  [address] Phone: [phone]; Fax: [phone]

## 2022-01-14 ENCOUNTER — Telehealth: Payer: Self-pay | Admitting: Internal Medicine

## 2022-01-14 ENCOUNTER — Other Ambulatory Visit: Payer: Self-pay | Admitting: Internal Medicine

## 2022-01-14 DIAGNOSIS — E118 Type 2 diabetes mellitus with unspecified complications: Secondary | ICD-10-CM

## 2022-01-14 DIAGNOSIS — N1832 Chronic kidney disease, stage 3b: Secondary | ICD-10-CM

## 2022-01-14 MED ORDER — DAPAGLIFLOZIN PROPANEDIOL 10 MG PO TABS: 10.0000 mg | ORAL_TABLET | Freq: Every day | ORAL | 0 refills | Status: DC

## 2022-01-14 NOTE — Telephone Encounter (Signed)
Patient calling for refill and appt:  FARXIGA 10 MG   [id number] [place] [phone] Fax: [phone]   Next Appointment 03/01/22.  Last Appt:  07/06/21  Does he need to see another provider so he can get short supply until appt or will provider call in a short supply until patient's appt 03/01/22?  Call pt [phone]

## 2022-01-19 ENCOUNTER — Encounter (INDEPENDENT_AMBULATORY_CARE_PROVIDER_SITE_OTHER): Payer: Medicare HMO | Admitting: Ophthalmology

## 2022-01-19 DIAGNOSIS — H35033 Hypertensive retinopathy, bilateral: Secondary | ICD-10-CM

## 2022-01-19 DIAGNOSIS — I10 Essential (primary) hypertension: Secondary | ICD-10-CM

## 2022-01-19 DIAGNOSIS — H43813 Vitreous degeneration, bilateral: Secondary | ICD-10-CM | POA: Diagnosis not present

## 2022-01-19 DIAGNOSIS — H35372 Puckering of macula, left eye: Secondary | ICD-10-CM | POA: Diagnosis not present

## 2022-01-19 NOTE — Telephone Encounter (Signed)
RX completed 01/14/22

## 2022-01-23 DIAGNOSIS — I10 Essential (primary) hypertension: Secondary | ICD-10-CM | POA: Diagnosis not present

## 2022-01-23 DIAGNOSIS — J019 Acute sinusitis, unspecified: Secondary | ICD-10-CM | POA: Diagnosis not present

## 2022-02-08 DIAGNOSIS — E890 Postprocedural hypothyroidism: Secondary | ICD-10-CM | POA: Diagnosis not present

## 2022-02-08 DIAGNOSIS — E119 Type 2 diabetes mellitus without complications: Secondary | ICD-10-CM | POA: Diagnosis not present

## 2022-02-08 LAB — BASIC METABOLIC PANEL
BUN: 19 (ref 4–21)
CO2: 21 (ref 13–22)
Chloride: 108 (ref 99–108)
Creatinine: 1.5 — AB (ref 0.6–1.3)
Glucose: 104
Potassium: 4.3 mEq/L (ref 3.5–5.1)
Sodium: 145 (ref 137–147)

## 2022-02-08 LAB — COMPREHENSIVE METABOLIC PANEL: Calcium: 9.9 (ref 8.7–10.7)

## 2022-02-08 LAB — TSH: TSH: 0.31 — AB (ref 0.41–5.90)

## 2022-02-08 LAB — HEMOGLOBIN A1C: Hemoglobin A1C: 6.6

## 2022-02-15 DIAGNOSIS — I10 Essential (primary) hypertension: Secondary | ICD-10-CM | POA: Diagnosis not present

## 2022-02-15 DIAGNOSIS — C73 Malignant neoplasm of thyroid gland: Secondary | ICD-10-CM | POA: Diagnosis not present

## 2022-02-15 DIAGNOSIS — E890 Postprocedural hypothyroidism: Secondary | ICD-10-CM | POA: Diagnosis not present

## 2022-02-15 DIAGNOSIS — N529 Male erectile dysfunction, unspecified: Secondary | ICD-10-CM | POA: Diagnosis not present

## 2022-02-15 DIAGNOSIS — E119 Type 2 diabetes mellitus without complications: Secondary | ICD-10-CM | POA: Diagnosis not present

## 2022-03-01 ENCOUNTER — Encounter: Payer: Self-pay | Admitting: Internal Medicine

## 2022-03-01 ENCOUNTER — Ambulatory Visit (INDEPENDENT_AMBULATORY_CARE_PROVIDER_SITE_OTHER): Payer: Medicare HMO | Admitting: Internal Medicine

## 2022-03-01 VITALS — BP 138/82 | HR 66 | Temp 98.3°F | Ht 71.0 in | Wt 240.0 lb

## 2022-03-01 DIAGNOSIS — Z23 Encounter for immunization: Secondary | ICD-10-CM

## 2022-03-01 DIAGNOSIS — E785 Hyperlipidemia, unspecified: Secondary | ICD-10-CM

## 2022-03-01 DIAGNOSIS — N1832 Chronic kidney disease, stage 3b: Secondary | ICD-10-CM

## 2022-03-01 DIAGNOSIS — I10 Essential (primary) hypertension: Secondary | ICD-10-CM

## 2022-03-01 DIAGNOSIS — E118 Type 2 diabetes mellitus with unspecified complications: Secondary | ICD-10-CM | POA: Diagnosis not present

## 2022-03-01 LAB — CBC WITH DIFFERENTIAL/PLATELET
Basophils Absolute: 0 10*3/uL (ref 0.0–0.1)
Basophils Relative: 0.6 % (ref 0.0–3.0)
Eosinophils Absolute: 0.2 10*3/uL (ref 0.0–0.7)
Eosinophils Relative: 2.3 % (ref 0.0–5.0)
HCT: 49.5 % (ref 39.0–52.0)
Hemoglobin: 16.5 g/dL (ref 13.0–17.0)
Lymphocytes Relative: 35.6 % (ref 12.0–46.0)
Lymphs Abs: 2.4 10*3/uL (ref 0.7–4.0)
MCHC: 33.4 g/dL (ref 30.0–36.0)
MCV: 87 fl (ref 78.0–100.0)
Monocytes Absolute: 0.5 10*3/uL (ref 0.1–1.0)
Monocytes Relative: 8 % (ref 3.0–12.0)
Neutro Abs: 3.6 10*3/uL (ref 1.4–7.7)
Neutrophils Relative %: 53.5 % (ref 43.0–77.0)
Platelets: 218 10*3/uL (ref 150.0–400.0)
RBC: 5.69 Mil/uL (ref 4.22–5.81)
RDW: 15.5 % (ref 11.5–15.5)
WBC: 6.7 10*3/uL (ref 4.0–10.5)

## 2022-03-01 LAB — LIPID PANEL
Cholesterol: 105 mg/dL (ref 0–200)
HDL: 54.9 mg/dL (ref >39.00)
LDL Cholesterol: 33 mg/dL (ref 0–99)
NonHDL: 50.16
Total CHOL/HDL Ratio: 2
Triglycerides: 87 mg/dL (ref 0.0–149.0)
VLDL: 17.4 mg/dL (ref 0.0–40.0)

## 2022-03-01 LAB — HEPATIC FUNCTION PANEL
ALT: 13 U/L (ref 0–53)
AST: 17 U/L (ref 0–37)
Albumin: 4.5 g/dL (ref 3.5–5.2)
Alkaline Phosphatase: 57 U/L (ref 39–117)
Bilirubin, Direct: 0.1 mg/dL (ref 0.0–0.3)
Total Bilirubin: 0.5 mg/dL (ref 0.2–1.2)
Total Protein: 7.5 g/dL (ref 6.0–8.3)

## 2022-03-01 NOTE — Patient Instructions (Signed)

## 2022-03-01 NOTE — Progress Notes (Signed)
Subjective:  Patient ID: Devon Brady, male    DOB: September 18, 1950  Age: 71 y.o. MRN: [id number]  CC: Hypertension and Diabetes   HPI Devon Brady presents for f/up -  He recently saw endocrinology.  He is active and denies chest pain, shortness of breath, diaphoresis, or edema.  Outpatient Medications Prior to Visit  Medication Sig Dispense Refill   aspirin EC 81 MG tablet Take 81 mg by mouth every evening.      dapagliflozin propanediol (FARXIGA) 10 MG TABS tablet Take 1 tablet (10 mg total) by mouth daily before breakfast. 90 tablet 0   Evolocumab (REPATHA SURECLICK) 841 MG/ML SOAJ Inject 140 mg into the skin every 14 (fourteen) days. 2 mL 11   levothyroxine (SYNTHROID) 125 MCG tablet Take 125 mcg by mouth daily.     metoprolol succinate (TOPROL-XL) 50 MG 24 hr tablet Take 1 tablet (50 mg total) by mouth daily. Take with or immediately following a meal. 90 tablet 3   amLODipine (NORVASC) 5 MG tablet Take 1 tablet (5 mg total) by mouth daily. 90 tablet 3   levothyroxine (SYNTHROID) 137 MCG tablet Take 137 mcg by mouth daily.     rosuvastatin (CRESTOR) 10 MG tablet TAKE 1 TABLET  THREE  TIMES A WEEK. 36 tablet 0   No facility-administered medications prior to visit.    ROS Review of Systems  Constitutional:  Positive for unexpected weight change (wt gain). Negative for appetite change, chills, diaphoresis and fatigue.  HENT: Negative.    Eyes: Negative.   Respiratory: Negative.  Negative for cough, chest tightness, shortness of breath and wheezing.   Cardiovascular:  Negative for chest pain, palpitations and leg swelling.  Gastrointestinal:  Negative for abdominal pain, diarrhea, nausea and vomiting.  Endocrine: Negative.   Genitourinary: Negative.  Negative for difficulty urinating.  Musculoskeletal: Negative.  Negative for arthralgias and myalgias.  Skin: Negative.   Neurological: Negative.   Hematological:  Negative for adenopathy. Does not bruise/bleed easily.   Psychiatric/Behavioral: Negative.      Objective:  BP 138/82 (BP Location: Right Arm, Patient Position: Sitting, Cuff Size: Large)   Pulse 66   Temp 98.3 F (36.8 C) (Oral)   Ht '5\' 11"'$  (1.803 m)   Wt 240 lb (108.9 kg)   SpO2 98%   BMI 33.47 kg/m   BP Readings from Last 3 Encounters:  03/01/22 138/82  01/04/22 122/80  07/06/21 126/84    Wt Readings from Last 3 Encounters:  03/01/22 240 lb (108.9 kg)  01/04/22 236 lb (107 kg)  07/06/21 245 lb (111.1 kg)    Physical Exam Vitals reviewed.  HENT:     Mouth/Throat:     Mouth: Mucous membranes are moist.  Eyes:     General: No scleral icterus.    Conjunctiva/sclera: Conjunctivae normal.  Cardiovascular:     Rate and Rhythm: Normal rate and regular rhythm.     Heart sounds: No murmur heard. Pulmonary:     Effort: Pulmonary effort is normal.     Breath sounds: No stridor. No wheezing, rhonchi or rales.  Abdominal:     General: Abdomen is flat.     Palpations: There is no mass.     Tenderness: There is no abdominal tenderness. There is no guarding.     Hernia: No hernia is present.  Musculoskeletal:        General: Normal range of motion.     Cervical back: Neck supple.     Right lower leg: No  edema.     Left lower leg: No edema.  Lymphadenopathy:     Cervical: No cervical adenopathy.  Skin:    General: Skin is warm and dry.  Neurological:     General: No focal deficit present.     Mental Status: He is alert.  Psychiatric:        Mood and Affect: Mood normal.        Behavior: Behavior normal.     Lab Results  Component Value Date   WBC 6.7 03/01/2022   HGB 16.5 03/01/2022   HCT 49.5 03/01/2022   PLT 218.0 03/01/2022   GLUCOSE 104 (H) 07/06/2021   CHOL 105 03/01/2022   TRIG 87.0 03/01/2022   HDL 54.90 03/01/2022   LDLCALC 33 03/01/2022   ALT 13 03/01/2022   AST 17 03/01/2022   NA 145 02/08/2022   K 4.3 02/08/2022   CL 108 02/08/2022   CREATININE 1.5 (A) 02/08/2022   BUN 19 02/08/2022   CO2 21  02/08/2022   TSH 0.31 (A) 02/08/2022   PSA 2.34 07/06/2021   INR 0.97 10/02/2013   HGBA1C 6.6 02/08/2022   MICROALBUR 16.9 (H) 07/06/2021    DG Chest 2 View  Result Date: 10/23/2019 CLINICAL DATA:  Cough and shortness of breath for 2 weeks. Coronary artery disease. EXAM: CHEST - 2 VIEW COMPARISON:  01/26/2017 FINDINGS: The heart size and mediastinal contours are within normal limits. 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 Both lungs are clear. The visualized skeletal structures are unremarkable. IMPRESSION: No active cardiopulmonary disease. Electronically Signed   By: Marlaine Hind M.D.   On: 10/23/2019 17:11    Assessment & Plan:   Devon Brady was seen today for hypertension and diabetes.  Diagnoses and all orders for this visit:  Essential hypertension- His blood pressure is not adequately well-controlled.  Will restart the CCB. -     Cancel: Basic metabolic panel; Future -     CBC with Differential/Platelet; Future -     Hepatic function panel; Future -     Hepatic function panel -     CBC with Differential/Platelet -     amLODipine (NORVASC) 5 MG tablet; Take 1 tablet (5 mg total) by mouth daily.  Type II diabetes mellitus with manifestations ([place])- His blood sugar is adequately well-controlled. -     Cancel: Basic metabolic panel; Future -     Cancel: Hemoglobin A1c; Future  Stage 3b chronic kidney disease ([place])- Will continue the SGLT2 inhibitor. -     Cancel: Basic metabolic panel; Future  Hyperlipidemia with target LDL less than 100- LDL goal achieved. Doing well on the statin  -     Lipid panel; Future -     Hepatic function panel; Future -     Hepatic function panel -     Lipid panel -     rosuvastatin (CRESTOR) 10 MG tablet; Take 1 tablet (10 mg total) by mouth 3 (three) times a week.  Other orders -     Cancel: Tdap vaccine greater than or equal to 7yo IM -     Td : Tetanus/diphtheria >7yo Preservative  free   I have changed Devon Brady's rosuvastatin. I am also  having him maintain his aspirin EC, metoprolol succinate, Repatha SureClick, dapagliflozin propanediol, levothyroxine, and amLODipine.  Meds ordered this encounter  Medications   rosuvastatin (CRESTOR) 10 MG tablet    Sig: Take 1 tablet (10 mg total) by mouth 3 (three) times a week.    Dispense:  36 tablet    Refill:  1   amLODipine (NORVASC) 5 MG tablet    Sig: Take 1 tablet (5 mg total) by mouth daily.    Dispense:  90 tablet    Refill:  1     Follow-up: Return in about 6 months (around 08/31/2022).  Scarlette Calico, MD

## 2022-03-06 MED ORDER — AMLODIPINE BESYLATE 5 MG PO TABS: 5.0000 mg | ORAL_TABLET | Freq: Every day | ORAL | 1 refills | Status: DC

## 2022-03-06 MED ORDER — ROSUVASTATIN CALCIUM 10 MG PO TABS: 10.0000 mg | ORAL_TABLET | ORAL | 1 refills | Status: DC

## 2022-03-15 ENCOUNTER — Other Ambulatory Visit: Payer: Self-pay | Admitting: Cardiovascular Disease

## 2022-03-15 DIAGNOSIS — I10 Essential (primary) hypertension: Secondary | ICD-10-CM

## 2022-03-25 ENCOUNTER — Telehealth: Payer: Self-pay

## 2022-03-25 ENCOUNTER — Other Ambulatory Visit (HOSPITAL_COMMUNITY): Payer: Self-pay

## 2022-03-25 NOTE — Telephone Encounter (Signed)
Pharmacy Patient Advocate Encounter  Prior Authorization for REPATHA 140 MG/ML INJ has been approved.    Effective dates: 03/25/22 through 03/26/23   Received notification from [facility] that prior authorization for REPATHA 140 MG/ML INJ is needed.    PA submitted on 03/25/22 Key [id number] Status is pending  Karie Soda, [place] Patient Advocate Specialist Direct Number: [phone] Fax: [phone]

## 2022-03-31 DIAGNOSIS — H35372 Puckering of macula, left eye: Secondary | ICD-10-CM | POA: Diagnosis not present

## 2022-03-31 DIAGNOSIS — H52203 Unspecified astigmatism, bilateral: Secondary | ICD-10-CM | POA: Diagnosis not present

## 2022-03-31 DIAGNOSIS — Z0100 Encounter for examination of eyes and vision without abnormal findings: Secondary | ICD-10-CM | POA: Diagnosis not present

## 2022-03-31 DIAGNOSIS — E119 Type 2 diabetes mellitus without complications: Secondary | ICD-10-CM | POA: Diagnosis not present

## 2022-03-31 DIAGNOSIS — Z961 Presence of intraocular lens: Secondary | ICD-10-CM | POA: Diagnosis not present

## 2022-03-31 DIAGNOSIS — H524 Presbyopia: Secondary | ICD-10-CM | POA: Diagnosis not present

## 2022-03-31 LAB — HM DIABETES EYE EXAM

## 2022-04-15 DIAGNOSIS — Z0100 Encounter for examination of eyes and vision without abnormal findings: Secondary | ICD-10-CM | POA: Diagnosis not present

## 2022-05-10 DIAGNOSIS — I129 Hypertensive chronic kidney disease with stage 1 through stage 4 chronic kidney disease, or unspecified chronic kidney disease: Secondary | ICD-10-CM | POA: Diagnosis not present

## 2022-05-10 DIAGNOSIS — R799 Abnormal finding of blood chemistry, unspecified: Secondary | ICD-10-CM | POA: Diagnosis not present

## 2022-05-10 DIAGNOSIS — N1832 Chronic kidney disease, stage 3b: Secondary | ICD-10-CM | POA: Diagnosis not present

## 2022-05-10 DIAGNOSIS — N135 Crossing vessel and stricture of ureter without hydronephrosis: Secondary | ICD-10-CM | POA: Diagnosis not present

## 2022-05-10 DIAGNOSIS — N189 Chronic kidney disease, unspecified: Secondary | ICD-10-CM | POA: Diagnosis not present

## 2022-05-11 LAB — HEMOGLOBIN A1C: Hemoglobin A1C: 6.7

## 2022-05-11 LAB — LAB REPORT - SCANNED
Hemoglobin A1c: 6.7
eGFR: 46

## 2022-05-12 LAB — COMPREHENSIVE METABOLIC PANEL
Albumin: 4.5 (ref 3.5–5.0)
Calcium: 9.5 (ref 8.7–10.7)
eGFR: 46

## 2022-05-12 LAB — BASIC METABOLIC PANEL
BUN: 18 (ref 4–21)
CO2: 26 — AB (ref 13–22)
Chloride: 109 — AB (ref 99–108)
Creatinine: 1.6 — AB (ref 0.6–1.3)
Glucose: 115
Potassium: 4.7 mEq/L (ref 3.5–5.1)
Sodium: 144 (ref 137–147)

## 2022-05-12 LAB — PROTEIN / CREATININE RATIO, URINE: Creatinine, Urine: 181.6

## 2022-05-12 LAB — MICROALBUMIN / CREATININE URINE RATIO: Microalb Creat Ratio: 166

## 2022-05-12 LAB — CBC AND DIFFERENTIAL: Hemoglobin: 16.2 (ref 13.5–17.5)

## 2022-05-12 LAB — MICROALBUMIN, URINE: Microalb, Ur: 17

## 2022-06-08 ENCOUNTER — Telehealth: Payer: Self-pay

## 2022-06-08 NOTE — Telephone Encounter (Signed)
Called patient to schedule Medicare Annual Wellness Visit (AWV). Left message for patient to call back and schedule Medicare Annual Wellness Visit (AWV).  Last date of AWV: 06/12/21  Please schedule an appointment at any time with [facility].   Norton Blizzard, [place] (AAMA)  [place] [facility] [phone]

## 2022-07-08 ENCOUNTER — Ambulatory Visit (INDEPENDENT_AMBULATORY_CARE_PROVIDER_SITE_OTHER): Payer: Medicare HMO

## 2022-07-08 VITALS — Ht 70.5 in | Wt 245.0 lb

## 2022-07-08 DIAGNOSIS — Z0000 Encounter for general adult medical examination without abnormal findings: Secondary | ICD-10-CM | POA: Diagnosis not present

## 2022-07-08 NOTE — Patient Instructions (Signed)
Mr. Devon Brady , Thank you for taking time to come for your Medicare Wellness Visit. I appreciate your ongoing commitment to your health goals. Please review the following plan we discussed and let me know if I can assist you in the future.   These are the goals we discussed:    This is a list of the screening recommended for you and due dates:  Health Maintenance  Topic Date Due   COVID-19 Vaccine (7 - 2023-24 season) 03/16/2022   Yearly kidney health urinalysis for diabetes  07/07/2022   Complete foot exam   07/11/2022   Flu Shot  10/14/2022   Hemoglobin A1C  11/09/2022   Eye exam for diabetics  04/01/2023   Yearly kidney function blood test for diabetes  05/12/2023   Medicare Annual Wellness Visit  07/08/2023   Colon Cancer Screening  02/19/2026   DTaP/Tdap/Td vaccine (4 - Td or Tdap) 03/01/2032   Pneumonia Vaccine  Completed   Hepatitis C Screening: USPSTF Recommendation to screen - Ages 48-79 yo.  Completed   Zoster (Shingles) Vaccine  Completed   HPV Vaccine  Aged Out    Advanced directives: Discussed with patient  Conditions/risks identified:  Keep up the good work  Next appointment: Follow up in one year for your annual wellness visit. 07/11/23  Preventive Care 65 Years and Older, Male  Preventive care refers to lifestyle choices and visits with your health care provider that can promote health and wellness. What does preventive care include? A yearly physical exam. This is also called an annual well check. Dental exams once or twice a year. Routine eye exams. Ask your health care provider how often you should have your eyes checked. Personal lifestyle choices, including: Daily care of your teeth and gums. Regular physical activity. Eating a healthy diet. Avoiding tobacco and drug use. Limiting alcohol use. Practicing safe sex. Taking low doses of aspirin every day. Taking vitamin and mineral supplements as recommended by your health care provider. What happens  during an annual well check? The services and screenings done by your health care provider during your annual well check will depend on your age, overall health, lifestyle risk factors, and family history of disease. Counseling  Your health care provider may ask you questions about your: Alcohol use. Tobacco use. Drug use. Emotional well-being. Home and relationship well-being. Sexual activity. Eating habits. History of falls. Memory and ability to understand (cognition). Work and work Astronomer. Screening  You may have the following tests or measurements: Height, weight, and BMI. Blood pressure. Lipid and cholesterol levels. These may be checked every 5 years, or more frequently if you are over 33 years old. Skin check. Lung cancer screening. You may have this screening every year starting at age 66 if you have a 30-pack-year history of smoking and currently smoke or have quit within the past 15 years. Fecal occult blood test (FOBT) of the stool. You may have this test every year starting at age 52. Flexible sigmoidoscopy or colonoscopy. You may have a sigmoidoscopy every 5 years or a colonoscopy every 10 years starting at age 41. Prostate cancer screening. Recommendations will vary depending on your family history and other risks. Hepatitis C blood test. Hepatitis B blood test. Sexually transmitted disease (STD) testing. Diabetes screening. This is done by checking your blood sugar (glucose) after you have not eaten for a while (fasting). You may have this done every 1-3 years. Abdominal aortic aneurysm (AAA) screening. You may need this if you are a current  or former smoker. Osteoporosis. You may be screened starting at age 68 if you are at high risk. Talk with your health care provider about your test results, treatment options, and if necessary, the need for more tests. Vaccines  Your health care provider may recommend certain vaccines, such as: Influenza vaccine. This is  recommended every year. Tetanus, diphtheria, and acellular pertussis (Tdap, Td) vaccine. You may need a Td booster every 10 years. Zoster vaccine. You may need this after age 84. Pneumococcal 13-valent conjugate (PCV13) vaccine. One dose is recommended after age 43. Pneumococcal polysaccharide (PPSV23) vaccine. One dose is recommended after age 22. Talk to your health care provider about which screenings and vaccines you need and how often you need them. This information is not intended to replace advice given to you by your health care provider. Make sure you discuss any questions you have with your health care provider. Document Released: 03/28/2015 Document Revised: 11/19/2015 Document Reviewed: 12/31/2014 Elsevier Interactive Patient Education  2017 [employer].  Fall Prevention in the Home Falls can cause injuries. They can happen to people of all ages. There are many things you can do to make your home safe and to help prevent falls. What can I do on the outside of my home? Regularly fix the edges of walkways and driveways and fix any cracks. Remove anything that might make you trip as you walk through a door, such as a raised step or threshold. Trim any bushes or trees on the path to your home. Use bright outdoor lighting. Clear any walking paths of anything that might make someone trip, such as rocks or tools. Regularly check to see if handrails are loose or broken. Make sure that both sides of any steps have handrails. Any raised decks and porches should have guardrails on the edges. Have any leaves, snow, or ice cleared regularly. Use sand or salt on walking paths during winter. Clean up any spills in your garage right away. This includes oil or grease spills. What can I do in the bathroom? Use night lights. Install grab bars by the toilet and in the tub and shower. Do not use towel bars as grab bars. Use non-skid mats or decals in the tub or shower. If you need to sit down in  the shower, use a plastic, non-slip stool. Keep the floor dry. Clean up any water that spills on the floor as soon as it happens. Remove soap buildup in the tub or shower regularly. Attach bath mats securely with double-sided non-slip rug tape. Do not have throw rugs and other things on the floor that can make you trip. What can I do in the bedroom? Use night lights. Make sure that you have a light by your bed that is easy to reach. Do not use any sheets or blankets that are too big for your bed. They should not hang down onto the floor. Have a firm chair that has side arms. You can use this for support while you get dressed. Do not have throw rugs and other things on the floor that can make you trip. What can I do in the kitchen? Clean up any spills right away. Avoid walking on wet floors. Keep items that you use a lot in easy-to-reach places. If you need to reach something above you, use a strong step stool that has a grab bar. Keep electrical cords out of the way. Do not use floor polish or wax that makes floors slippery. If you must use wax,  use non-skid floor wax. Do not have throw rugs and other things on the floor that can make you trip. What can I do with my stairs? Do not leave any items on the stairs. Make sure that there are handrails on both sides of the stairs and use them. Fix handrails that are broken or loose. Make sure that handrails are as long as the stairways. Check any carpeting to make sure that it is firmly attached to the stairs. Fix any carpet that is loose or worn. Avoid having throw rugs at the top or bottom of the stairs. If you do have throw rugs, attach them to the floor with carpet tape. Make sure that you have a light switch at the top of the stairs and the bottom of the stairs. If you do not have them, ask someone to add them for you. What else can I do to help prevent falls? Wear shoes that: Do not have high heels. Have rubber bottoms. Are comfortable  and fit you well. Are closed at the toe. Do not wear sandals. If you use a stepladder: Make sure that it is fully opened. Do not climb a closed stepladder. Make sure that both sides of the stepladder are locked into place. Ask someone to hold it for you, if possible. Clearly mark and make sure that you can see: Any grab bars or handrails. First and last steps. Where the edge of each step is. Use tools that help you move around (mobility aids) if they are needed. These include: Canes. Walkers. Scooters. Crutches. Turn on the lights when you go into a dark area. Replace any light bulbs as soon as they burn out. Set up your furniture so you have a clear path. Avoid moving your furniture around. If any of your floors are uneven, fix them. If there are any pets around you, be aware of where they are. Review your medicines with your doctor. Some medicines can make you feel dizzy. This can increase your chance of falling. Ask your doctor what other things that you can do to help prevent falls. This information is not intended to replace advice given to you by your health care provider. Make sure you discuss any questions you have with your health care provider. Document Released: 12/26/2008 Document Revised: 08/07/2015 Document Reviewed: 04/05/2014 Elsevier Interactive Patient Education  2017 [employer].

## 2022-07-08 NOTE — Progress Notes (Signed)
Subjective:   Devon Brady is a 72 y.o. male who presents for Medicare Annual/Subsequent preventive examination.   I connected with  Berton Mount on 07/08/22 by a audio enabled telemedicine application and verified that I am speaking with the correct person using two identifiers.  Patient Location: Home  Provider Location: Home Office  I discussed the limitations of evaluation and management by telemedicine. The patient expressed understanding and agreed to proceed.     Review of Systems    Cardiac Risk Factors include: advanced age (>32men, >44 women);hypertension;diabetes mellitus;dyslipidemia;male gender     Objective:    Today's Vitals   07/08/22 1603  Weight: 245 lb (111.1 kg)  Height: 5' 10.5" (1.791 m)   Body mass index is 34.66 kg/m.     07/08/2022    4:18 PM 06/12/2021    8:50 AM 06/09/2020    2:32 PM 10/31/2015    8:49 AM 11/06/2014    9:02 AM 12/03/2013    3:32 PM 10/03/2013    3:00 PM  Advanced Directives  Does Patient Have a Medical Advance Directive? No Yes Yes Yes Yes Yes Patient has advance directive, copy in chart  Type of Advance Directive  Living will;Healthcare Power of Attorney Living will;Healthcare Power of [employer] Power of [place];Living will Healthcare Power of [place];Living will Living will Healthcare Power of [place];Living will  Does patient want to make changes to medical advance directive?  No - Patient declined No - Patient declined Yes - information given     Copy of Healthcare Power of Attorney in Chart?  No - copy requested No - copy requested No - copy requested No - copy requested No - copy requested   Would patient like information on creating a medical advance directive? No - Patient declined          Current Medications (verified) Outpatient Encounter Medications as of 07/08/2022  Medication Sig   amLODipine (NORVASC) 5 MG tablet TAKE 1 TABLET (5 MG TOTAL) BY MOUTH DAILY.   aspirin EC 81 MG tablet Take  81 mg by mouth every evening.    Evolocumab (REPATHA SURECLICK) 140 MG/ML SOAJ Inject 140 mg into the skin every 14 (fourteen) days.   levothyroxine (SYNTHROID) 125 MCG tablet Take 125 mcg by mouth daily.   metoprolol succinate (TOPROL-XL) 50 MG 24 hr tablet TAKE 1 TABLET (50 MG TOTAL) BY MOUTH DAILY. TAKE WITH OR IMMEDIATELY FOLLOWING A MEAL.   rosuvastatin (CRESTOR) 10 MG tablet Take 1 tablet (10 mg total) by mouth 3 (three) times a week.   dapagliflozin propanediol (FARXIGA) 10 MG TABS tablet Take 1 tablet (10 mg total) by mouth daily before breakfast. (Patient not taking: Reported on 07/08/2022)   [DISCONTINUED] citalopram (CELEXA) 20 MG tablet Take 1 tablet (20 mg total) by mouth daily.   [DISCONTINUED] omeprazole (PRILOSEC OTC) 20 MG tablet Take 1 tablet (20 mg total) by mouth daily.   No facility-administered encounter medications on file as of 07/08/2022.    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   Renal insufficiency    Sleep apnea    No CPAP   TIA (transient ischemic attack) 2011   Past Surgical History:  Procedure Laterality Date   BONE MARROW ASPIRATE,BIOPSY, AND CLOT  06/11/11   LEFT ILIAC CREAST   BUNIONECTOMY Right 1978   foot   CARDIAC CATHETERIZATION  10/15/2009   COLONOSCOPY     CORONARY ARTERY BYPASS GRAFT  10/17/2009   LIMA to LAD,SVG to Ramus,SVG to OM sequential to OM2, SVG to marginal of RCA   ESOPHAGOGASTRODUODENOSCOPY     LEFT HEART CATHETERIZATION WITH CORONARY/GRAFT ANGIOGRAM N/A 10/03/2013   Procedure:  LEFT HEART CATHETERIZATION WITH Isabel Caprice;  Surgeon: Lesleigh Noe, MD;  Location: [facility] CATH LAB;  Service: Cardiovascular;  Laterality: N/A;   LYMPH NODE BIOPSY  06/07/11   RIGHT INGUINAL NODE: CLASSICAL HODGKIN'S LYMPHOMA, NODULAR SCLEROSIS TYPE   PORT-A-CATH REMOVAL Left 12/06/2013   Procedure: REMOVAL PORT-A-CATH;  Surgeon: Almond Lint, MD;  Location: [facility];  Service: General;  Laterality: Left;   PORTACATH PLACEMENT  06/10/2011   Procedure: INSERTION PORT-A-CATH;  Surgeon: Almond Lint, MD;  Location: [facility] ORS;  Service: General;  Laterality: Left;  subclavian    THYROIDECTOMY  03/31/2012   Procedure: THYROIDECTOMY;  Surgeon: Serena Colonel, MD;  Location: [facility] OR;  Service: ENT;  Laterality: N/A;   TONSILLECTOMY     as a child   VASECTOMY  1980   Family History  Problem Relation Age of Onset   Arthritis Mother    Hypertension Mother    Diabetes Father    Coronary artery disease Father 76       Died age 1   Kidney disease Father    Hypertension Sister    Hypertension Sister        2nd Sister   Alcohol abuse Brother    Prostate cancer Paternal Uncle        Great   Cancer Neg Hx    Drug abuse Neg Hx    Early death Neg Hx    Stroke Neg Hx    Social History   Socioeconomic History   Marital status: Married    Spouse name: Not on file   Number of children: 2   Years of education: Not on file   Highest education level: Not on file  Occupational History   Occupation: REGIONAL SPECIALIST    Employer: [employer] [place]    Comment: Environmental Health.   Tobacco Use   Smoking status: Never   Smokeless tobacco: Never  Substance and Sexual Activity   Alcohol use: Yes    Comment: 10/02/2013 "couple beers; couple times/month"   Drug use: No   Sexual activity: Never  Other Topics Concern   Not on file  Social History Narrative   Lives in home with wife Rosey Bath and Sister   Social Determinants of Health   Financial Resource Strain: Low Risk   (07/08/2022)   Overall Financial Resource Strain (CARDIA)    Difficulty of Paying Living Expenses: Not hard at all  Food Insecurity: No Food Insecurity (07/08/2022)   Hunger Vital Sign    Worried About Running Out of Food in the Last Year: Never true    Ran Out of Food in the Last Year: Never true  Transportation Needs: No Transportation Needs (07/08/2022)   PRAPARE - Transportation    Lack of Transportation (Medical): No    Lack of  Transportation (Non-Medical): No  Physical Activity: Sufficiently Active (07/08/2022)   Exercise Vital Sign    Days of Exercise per Week: 5 days    Minutes of Exercise per Session: 40 min  Stress: No Stress Concern Present (07/08/2022)   [employer] of Occupational Health - Occupational Stress Questionnaire    Feeling of Stress : Not at all  Social Connections: Unknown (07/08/2022)   Social Connection and Isolation Panel [NHANES]    Frequency of Communication with Friends and Family: More than three times a week    Frequency of Social Gatherings with Friends and Family: Once a week    Attends Religious Services: Not on Marketing executive or Organizations: Yes    Attends Engineer, structural: More than 4 times per year    Marital Status: Married    Tobacco Counseling Counseling given: Not Answered   Clinical Intake:  Pre-visit preparation completed: Yes  Pain : No/denies pain     BMI - recorded: 34.66 Nutritional Status: BMI > 30  Obese Nutritional Risks: None Diabetes: Yes CBG done?: No Did pt. bring in CBG monitor from home?: No  How often do you need to have someone help you when you read instructions, pamphlets, or other written materials from your doctor or pharmacy?: 1 - Never  Diabetic?   Yes  Interpreter Needed?: No  Information entered by :: Kandis Cocking, CMA   Activities of Daily Living    07/08/2022    4:38 PM 07/06/2022    9:29 AM  In your present state of health, do you have any difficulty  performing the following activities:  Hearing? 0 0  Vision? 0 0  Difficulty concentrating or making decisions? 0 0  Walking or climbing stairs? 0 0  Dressing or bathing? 0 0  Doing errands, shopping? 0 0  Preparing Food and eating ? N N  Using the Toilet? N N  In the past six months, have you accidently leaked urine? N N  Do you have problems with loss of bowel control? N N  Managing your Medications? N N  Managing your Finances? N N  Housekeeping or managing your Housekeeping? N N    Patient Care Team: Etta Grandchild, MD as PCP - General (Internal Medicine) Kathleene Hazel, MD as PCP - Cardiology (Cardiology) Annie Sable, MD as Consulting Physician (Nephrology) Dorisann Frames, MD as Consulting Physician (Endocrinology) Serena Colonel, MD as Consulting Physician (Otolaryngology) Vida Rigger, MD as Consulting Physician (Gastroenterology) Artis Delay, MD as Consulting Physician (Hematology and Oncology) Vivi Barrack, DPM as Consulting Physician (Podiatry) Annie Sable, MD as Consulting Physician (Nephrology)  Indicate any recent Medical Services you may have received from other than [facility] providers in the past year (date may be approximate).     Assessment:   This is a routine wellness examination for Rea.  Hearing/Vision screen Hearing Screening - Comments:: Patient wears bilateral hearing aids Vision Screening - Comments:: Wears rx glasses - up to date with routine eye exams with  Dr Burgess [facility]elle at [facility]  Dietary issues and exercise activities discussed: Current Exercise Habits: The patient does not participate in regular exercise at present   Goals Addressed             This Visit's Progress    Patient Stated       Lose weight,also take less medication       Depression Screen    07/08/2022    4:39 PM 06/12/2021  8:36 AM 06/09/2020    2:31 PM 02/11/2020    9:07 AM 02/01/2019    8:48 AM 01/31/2018    8:13 AM  01/27/2017    5:06 PM  PHQ 2/9 Scores  PHQ - 2 Score 0 0 0 0 0 0 0    Fall Risk    07/08/2022    4:41 PM 07/06/2022    9:29 AM 06/12/2021    8:38 AM 06/09/2020    2:32 PM 02/11/2020    9:07 AM  Fall Risk   Falls in the past year? 0 0 0 0 0  Number falls in past yr: 0 0 0 0   Injury with Fall? 0 0 0 0   Risk for fall due to :   No Fall Risks No Fall Risks   Follow up   Falls evaluation completed Falls evaluation completed     FALL RISK PREVENTION PERTAINING TO THE HOME:  Any stairs in or around the home? Yes  If so, are there any without handrails? No  Home free of loose throw rugs in walkways, pet beds, electrical cords, etc? No  Adequate lighting in your home to reduce risk of falls? Yes   ASSISTIVE DEVICES UTILIZED TO PREVENT FALLS:  Life alert? No  Use of a cane, walker or w/c? No  Grab bars in the bathroom? No  Shower chair or bench in shower? No  Elevated toilet seat or a handicapped toilet? Yes   TIMED UP AND GO:  Was the test performed? No .    televisit  Cognitive Function:    06/12/2021    8:39 AM  MMSE - Mini Mental State Exam  Orientation to time 5  Orientation to Place 5  Registration 3  Attention/ Calculation 5  Recall 3  Language- name 2 objects 2  Language- repeat 1  Language- follow 3 step command 3  Language- read & follow direction 1  Write a sentence 1  Copy design 1  Total score 30        07/08/2022    4:38 PM  6CIT Screen  What Year? 0 points  What month? 0 points  What time? 0 points  Count back from 20 0 points  Months in reverse 0 points  Repeat phrase 0 points  Total Score 0 points    Immunizations Immunization History  Administered Date(s) Administered   COVID-19, mRNA, vaccine(Comirnaty)12 years and older 01/19/2022   DTaP 10/11/2008   Fluad Quad(high Dose 65+) 01/12/2021, 01/19/2022   Influenza Split 12/13/2011, 12/31/2019   Influenza, High Dose Seasonal PF 12/07/2018   Influenza, Quadrivalent, Recombinant, Inj, Pf  01/06/2018, 01/07/2020   Influenza,inj,Quad PF,6+ Mos 11/22/2012   Influenza-Unspecified 12/12/2015, 12/13/2016, 01/06/2018   Moderna Sars-Covid-2 Vaccination 03/13/2019, 04/10/2019, 12/04/2019, 07/22/2020   Pfizer Covid-19 Vaccine Bivalent Booster 33yrs & up 01/15/2021   Pneumococcal Conjugate-13 02/13/2014   Pneumococcal Polysaccharide-23 12/13/2011, 04/25/2017   Td 03/01/2022   Tdap 03/14/2012   Zoster Recombinat (Shingrix) 10/29/2020, 01/15/2021, 03/26/2021   Zoster, Live 01/09/2014    TDAP status: Up to date  Flu Vaccine status: Up to date  Pneumococcal vaccine status: Up to date  Covid-19 vaccine status: Completed vaccines  Qualifies for Shingles Vaccine? Yes   Zostavax completed Yes   Shingrix Completed?: Yes  Screening Tests Health Maintenance  Topic Date Due   COVID-19 Vaccine (7 - 2023-24 season) 03/16/2022   Diabetic kidney evaluation - Urine ACR  07/07/2022   FOOT EXAM  07/11/2022   INFLUENZA VACCINE  10/14/2022  HEMOGLOBIN A1C  11/09/2022   OPHTHALMOLOGY EXAM  04/01/2023   Diabetic kidney evaluation - eGFR measurement  05/12/2023   Medicare Annual Wellness (AWV)  07/08/2023   COLONOSCOPY (Pts 45-30yrs Insurance coverage will need to be confirmed)  02/19/2026   DTaP/Tdap/Td (4 - Td or Tdap) 03/01/2032   Pneumonia Vaccine 15+ Years old  Completed   Hepatitis C Screening  Completed   Zoster Vaccines- Shingrix  Completed   HPV VACCINES  Aged Out    Health Maintenance  Health Maintenance Due  Topic Date Due   COVID-19 Vaccine (7 - 2023-24 season) 03/16/2022   Diabetic kidney evaluation - Urine ACR  07/07/2022    Colorectal cancer screening: Type of screening: Colonoscopy. Completed 02/20/2016   . Repeat every 10 years  Lung Cancer Screening: (Low Dose CT Chest recommended if Age 82-80 years, 30 pack-year currently smoking OR have quit w/in 15years.) does not qualify.   Lung Cancer Screening Referral: N/A  Additional Screening:  Hepatitis C  Screening: does qualify; Completed 12/31/2015  Vision Screening: Recommended annual ophthalmology exams for early detection of glaucoma and other disorders of the eye. Is the patient up to date with their annual eye exam?  Yes  Who is the provider or what is the name of the office in which the patient attends annual eye exams?    Dr. Burgess Estelle at [facility]  If pt is not established with a provider, would they like to be referred to a provider to establish care? No .   Dental Screening: Recommended annual dental exams for proper oral hygiene  Community Resource Referral / Chronic Care Management: CRR required this visit?  No   CCM required this visit?  No      Plan:     I have personally reviewed and noted the following in the patient's chart:   Medical and social history Use of alcohol, tobacco or illicit drugs  Current medications and supplements including opioid prescriptions. Patient is not currently taking opioid prescriptions. Functional ability and status Nutritional status Physical activity Advanced directives List of other physicians Hospitalizations, surgeries, and ER visits in previous 12 months Vitals Screenings to include cognitive, depression, and falls Referrals and appointments  In addition, I have reviewed and discussed with patient certain preventive protocols, quality metrics, and best practice recommendations. A written personalized care plan for preventive services as well as general preventive health recommendations were provided to patient.     Milus Mallick, CMA   07/08/2022   Nurse Notes:   No concerns

## 2022-07-12 ENCOUNTER — Ambulatory Visit (INDEPENDENT_AMBULATORY_CARE_PROVIDER_SITE_OTHER): Payer: Medicare HMO | Admitting: Internal Medicine

## 2022-07-12 ENCOUNTER — Encounter: Payer: Self-pay | Admitting: Internal Medicine

## 2022-07-12 VITALS — BP 124/72 | HR 63 | Temp 97.9°F | Ht 70.5 in | Wt 242.0 lb

## 2022-07-12 DIAGNOSIS — E118 Type 2 diabetes mellitus with unspecified complications: Secondary | ICD-10-CM

## 2022-07-12 DIAGNOSIS — Z0000 Encounter for general adult medical examination without abnormal findings: Secondary | ICD-10-CM

## 2022-07-12 DIAGNOSIS — N1832 Chronic kidney disease, stage 3b: Secondary | ICD-10-CM

## 2022-07-12 DIAGNOSIS — N5201 Erectile dysfunction due to arterial insufficiency: Secondary | ICD-10-CM | POA: Diagnosis not present

## 2022-07-12 DIAGNOSIS — I10 Essential (primary) hypertension: Secondary | ICD-10-CM

## 2022-07-12 DIAGNOSIS — Z0001 Encounter for general adult medical examination with abnormal findings: Secondary | ICD-10-CM

## 2022-07-12 DIAGNOSIS — N400 Enlarged prostate without lower urinary tract symptoms: Secondary | ICD-10-CM | POA: Diagnosis not present

## 2022-07-12 DIAGNOSIS — R7989 Other specified abnormal findings of blood chemistry: Secondary | ICD-10-CM | POA: Insufficient documentation

## 2022-07-12 LAB — PSA: PSA: 1.88 ng/mL (ref 0.10–4.00)

## 2022-07-12 MED ORDER — DAPAGLIFLOZIN PROPANEDIOL 10 MG PO TABS
10.0000 mg | ORAL_TABLET | Freq: Every day | ORAL | 1 refills | Status: DC
Start: 2022-07-12 — End: 2023-03-28

## 2022-07-12 NOTE — Patient Instructions (Signed)
Health Maintenance, Male Adopting a healthy lifestyle and getting preventive care are important in promoting health and wellness. Ask your health care provider about: The right schedule for you to have regular tests and exams. Things you can do on your own to prevent diseases and keep yourself healthy. What should I know about diet, weight, and exercise? Eat a healthy diet  Eat a diet that includes plenty of vegetables, fruits, low-fat dairy products, and lean protein. Do not eat a lot of foods that are high in solid fats, added sugars, or sodium. Maintain a healthy weight Body mass index (BMI) is a measurement that can be used to identify possible weight problems. It estimates body fat based on height and weight. Your health care provider can help determine your BMI and help you achieve or maintain a healthy weight. Get regular exercise Get regular exercise. This is one of the most important things you can do for your health. Most adults should: Exercise for at least 150 minutes each week. The exercise should increase your heart rate and make you sweat (moderate-intensity exercise). Do strengthening exercises at least twice a week. This is in addition to the moderate-intensity exercise. Spend less time sitting. Even light physical activity can be beneficial. Watch cholesterol and blood lipids Have your blood tested for lipids and cholesterol at 71 years of age, then have this test every 5 years. You may need to have your cholesterol levels checked more often if: Your lipid or cholesterol levels are high. You are older than 71 years of age. You are at high risk for heart disease. What should I know about cancer screening? Many types of cancers can be detected early and may often be prevented. Depending on your health history and family history, you may need to have cancer screening at various ages. This may include screening for: Colorectal cancer. Prostate cancer. Skin cancer. Lung  cancer. What should I know about heart disease, diabetes, and high blood pressure? Blood pressure and heart disease High blood pressure causes heart disease and increases the risk of stroke. This is more likely to develop in people who have high blood pressure readings or are overweight. Talk with your health care provider about your target blood pressure readings. Have your blood pressure checked: Every 3-5 years if you are 18-39 years of age. Every year if you are 40 years old or older. If you are between the ages of 65 and 75 and are a current or former smoker, ask your health care provider if you should have a one-time screening for abdominal aortic aneurysm (AAA). Diabetes Have regular diabetes screenings. This checks your fasting blood sugar level. Have the screening done: Once every three years after age 45 if you are at a normal weight and have a low risk for diabetes. More often and at a younger age if you are overweight or have a high risk for diabetes. What should I know about preventing infection? Hepatitis B If you have a higher risk for hepatitis B, you should be screened for this virus. Talk with your health care provider to find out if you are at risk for hepatitis B infection. Hepatitis C Blood testing is recommended for: Everyone born from 1945 through 1965. Anyone with known risk factors for hepatitis C. Sexually transmitted infections (STIs) You should be screened each year for STIs, including gonorrhea and chlamydia, if: You are sexually active and are younger than 71 years of age. You are older than 71 years of age and your   health care provider tells you that you are at risk for this type of infection. Your sexual activity has changed since you were last screened, and you are at increased risk for chlamydia or gonorrhea. Ask your health care provider if you are at risk. Ask your health care provider about whether you are at high risk for HIV. Your health care provider  may recommend a prescription medicine to help prevent HIV infection. If you choose to take medicine to prevent HIV, you should first get tested for HIV. You should then be tested every 3 months for as long as you are taking the medicine. Follow these instructions at home: Alcohol use Do not drink alcohol if your health care provider tells you not to drink. If you drink alcohol: Limit how much you have to 0-2 drinks a day. Know how much alcohol is in your drink. In the [place] one drink equals one 12 oz bottle of beer (355 mL), one 5 oz glass of wine (148 mL), or one 1 oz glass of hard liquor (44 mL). Lifestyle Do not use any products that contain nicotine or tobacco. These products include cigarettes, chewing tobacco, and vaping devices, such as e-cigarettes. If you need help quitting, ask your health care provider. Do not use street drugs. Do not share needles. Ask your health care provider for help if you need support or information about quitting drugs. General instructions Schedule regular health, dental, and eye exams. Stay current with your vaccines. Tell your health care provider if: You often feel depressed. You have ever been abused or do not feel safe at home. Summary Adopting a healthy lifestyle and getting preventive care are important in promoting health and wellness. Follow your health care provider's instructions about healthy diet, exercising, and getting tested or screened for diseases. Follow your health care provider's instructions on monitoring your cholesterol and blood pressure. This information is not intended to replace advice given to you by your health care provider. Make sure you discuss any questions you have with your health care provider. Document Revised: 07/21/2020 Document Reviewed: 07/21/2020 Elsevier Patient Education  2023 Elsevier Inc.  

## 2022-07-12 NOTE — Progress Notes (Unsigned)
Subjective:  Patient ID: Devon Brady, male    DOB: [birth year]  Age: 72 y.o. MRN: [id number]  CC: Annual Exam, Hypothyroidism, Hyperlipidemia, and Diabetes   HPI RONITH BERTI presents for a CPX and f/up ---   Outpatient Medications Prior to Visit  Medication Sig Dispense Refill   amLODipine (NORVASC) 5 MG tablet TAKE 1 TABLET (5 MG TOTAL) BY MOUTH DAILY. 90 tablet 3   aspirin EC 81 MG tablet Take 81 mg by mouth every evening.      Evolocumab (REPATHA SURECLICK) 140 MG/ML SOAJ Inject 140 mg into the skin every 14 (fourteen) days. 2 mL 11   levothyroxine (SYNTHROID) 125 MCG tablet Take 125 mcg by mouth daily.     metoprolol succinate (TOPROL-XL) 50 MG 24 hr tablet TAKE 1 TABLET (50 MG TOTAL) BY MOUTH DAILY. TAKE WITH OR IMMEDIATELY FOLLOWING A MEAL. 90 tablet 3   rosuvastatin (CRESTOR) 10 MG tablet Take 1 tablet (10 mg total) by mouth 3 (three) times a week. 36 tablet 1   dapagliflozin propanediol (FARXIGA) 10 MG TABS tablet Take 1 tablet (10 mg total) by mouth daily before breakfast. 90 tablet 0   No facility-administered medications prior to visit.    ROS Review of Systems  Objective:  BP 124/72 (BP Location: Left Arm, Patient Position: Sitting, Cuff Size: Large)   Pulse 63   Temp 97.9 F (36.6 C) (Oral)   Ht 5' 10.5" (1.791 m)   Wt 242 lb (109.8 kg)   SpO2 96%   BMI 34.23 kg/m   BP Readings from Last 3 Encounters:  07/12/22 124/72  03/01/22 138/82  01/04/22 122/80    Wt Readings from Last 3 Encounters:  07/12/22 242 lb (109.8 kg)  07/08/22 245 lb (111.1 kg)  03/01/22 240 lb (108.9 kg)    Physical Exam  Lab Results  Component Value Date   WBC 6.7 03/01/2022   HGB 16.5 03/01/2022   HCT 49.5 03/01/2022   PLT 218.0 03/01/2022   GLUCOSE 104 (H) 07/06/2021   CHOL 105 03/01/2022   TRIG 87.0 03/01/2022   HDL 54.90 03/01/2022   LDLCALC 33 03/01/2022   ALT 13 03/01/2022   AST 17 03/01/2022   NA 145 02/08/2022   K 4.3 02/08/2022   CL 108  02/08/2022   CREATININE 1.5 (A) 02/08/2022   BUN 19 02/08/2022   CO2 21 02/08/2022   TSH 0.31 (A) 02/08/2022   PSA 2.34 07/06/2021   INR 0.97 10/02/2013   HGBA1C 6.7 05/11/2022   MICROALBUR 16.9 (H) 07/06/2021    DG Chest 2 View  Result Date: 10/23/2019 CLINICAL DATA:  Cough and shortness of breath for 2 weeks. Coronary artery disease. EXAM: CHEST - 2 VIEW COMPARISON:  01/26/2017 FINDINGS: The heart size and mediastinal contours are within normal limits. 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 Both lungs are clear. The visualized skeletal structures are unremarkable. IMPRESSION: No active cardiopulmonary disease. Electronically Signed   By: Danae Orleans M.D.   On: 10/23/2019 17:11    Assessment & Plan:   Benign prostatic hyperplasia without lower urinary tract symptoms -     PSA; Future -     Urinalysis, Routine w reflex microscopic; Future -     Ambulatory referral to Urology  Stage 3b chronic kidney disease (HCC) -     Urinalysis, Routine w reflex microscopic; Future -     Dapagliflozin Propanediol; Take 1 tablet (10 mg total) by mouth daily before breakfast.  Dispense: 90 tablet; Refill: 1  Encounter for general adult medical examination with abnormal findings  Essential hypertension  Type II diabetes mellitus with manifestations (HCC) -     Urinalysis, Routine w reflex microscopic; Future -     Dapagliflozin Propanediol; Take 1 tablet (10 mg total) by mouth daily before breakfast.  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  Sanda Linger, MD

## 2022-08-03 ENCOUNTER — Ambulatory Visit: Payer: Medicare HMO | Admitting: Internal Medicine

## 2022-08-05 ENCOUNTER — Other Ambulatory Visit: Payer: Self-pay | Admitting: Internal Medicine

## 2022-08-05 DIAGNOSIS — E785 Hyperlipidemia, unspecified: Secondary | ICD-10-CM

## 2022-08-19 ENCOUNTER — Telehealth: Payer: Self-pay | Admitting: Internal Medicine

## 2022-08-19 DIAGNOSIS — N5201 Erectile dysfunction due to arterial insufficiency: Secondary | ICD-10-CM | POA: Diagnosis not present

## 2022-08-19 DIAGNOSIS — N402 Nodular prostate without lower urinary tract symptoms: Secondary | ICD-10-CM | POA: Diagnosis not present

## 2022-08-19 DIAGNOSIS — N183 Chronic kidney disease, stage 3 unspecified: Secondary | ICD-10-CM | POA: Diagnosis not present

## 2022-08-19 NOTE — Telephone Encounter (Signed)
Pt called stating [employer] say he qualifies for a Gluclose True air Metric monitor and strips. Please advised. Pt also has the paper work that was mailed to him from [place].

## 2022-08-23 MED ORDER — BLOOD GLUCOSE MONITORING SUPPL DEVI: 1.0000 | Freq: Three times a day (TID) | 0 refills | Status: DC

## 2022-08-23 MED ORDER — BLOOD GLUCOSE TEST VI STRP: 1.0000 | ORAL_STRIP | Freq: Three times a day (TID) | 0 refills | Status: AC

## 2022-08-23 MED ORDER — LANCET DEVICE MISC: 1.0000 | Freq: Three times a day (TID) | 0 refills | Status: AC

## 2022-08-23 MED ORDER — LANCETS MISC. MISC: 1.0000 | Freq: Three times a day (TID) | 0 refills | Status: AC

## 2022-08-23 NOTE — Telephone Encounter (Signed)
Meter kit has been sent to [employer] order.

## 2022-09-16 DIAGNOSIS — M79675 Pain in left toe(s): Secondary | ICD-10-CM | POA: Diagnosis not present

## 2022-09-24 ENCOUNTER — Ambulatory Visit (INDEPENDENT_AMBULATORY_CARE_PROVIDER_SITE_OTHER): Payer: Medicare HMO | Admitting: Podiatry

## 2022-09-24 DIAGNOSIS — M7752 Other enthesopathy of left foot: Secondary | ICD-10-CM | POA: Diagnosis not present

## 2022-09-24 DIAGNOSIS — M10072 Idiopathic gout, left ankle and foot: Secondary | ICD-10-CM | POA: Diagnosis not present

## 2022-09-24 NOTE — Progress Notes (Signed)
Subjective:  Patient ID: Devon Brady, male    DOB: Jun 21, 1950,  MRN: [id number]  Chief Complaint  Patient presents with   Toe Pain    72 y.o. male presents with the above complaint.  Patient presents with left first metatarsophalangeal joint capsulitis pain on palpation to the joint has gotten much better.  Patient states it was swollen and painful to walk on he has doing much better he would like to still discuss treatment options.  There are some residual pain pain scale is 5 out of 10 dull achy in nature.   Review of Systems: Negative except as noted in the HPI. Denies N/V/F/Ch.  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 May substitute to any manufacturer covered by patient's insurance., Disp: 1 each, Rfl: 0   amLODipine (NORVASC) 5 MG tablet, TAKE 1 TABLET (5 MG TOTAL) BY MOUTH DAILY., Disp: 90 tablet, Rfl: 3   aspirin EC 81 MG tablet, Take 81 mg by mouth every evening. , Disp: , Rfl:    dapagliflozin propanediol (FARXIGA) 10 MG TABS tablet, Take 1 tablet (10 mg total) by mouth daily before breakfast., Disp: 90 tablet, Rfl: 1   Evolocumab (REPATHA SURECLICK) 140 MG/ML SOAJ, Inject 140 mg into the skin every 14 (fourteen) days., Disp: 2 mL, Rfl: 11   levothyroxine (SYNTHROID) 125 MCG tablet, Take 125 mcg by mouth daily.,  Disp: , Rfl:    metoprolol succinate (TOPROL-XL) 50 MG 24 hr tablet, TAKE 1 TABLET (50 MG TOTAL) BY MOUTH DAILY. TAKE WITH OR IMMEDIATELY FOLLOWING A MEAL., Disp: 90 tablet, Rfl: 3   rosuvastatin (CRESTOR) 10 MG tablet, TAKE 1 TABLET THREE TIMES WEEKLY, Disp: 36 tablet, Rfl: 1  Social History   Tobacco Use  Smoking Status Never  Smokeless Tobacco Never    Allergies  Allergen Reactions   Crestor [Rosuvastatin]     Muscle aches   Cialis [Tadalafil] Other (See Comments)    headache   Objective:  There were no vitals filed for this visit. There is no height or weight on file to calculate BMI. Constitutional Well developed. Well nourished.  Vascular Dorsalis pedis pulses palpable bilaterally. Posterior tibial pulses palpable bilaterally. Capillary refill normal to all digits.  No cyanosis or clubbing noted. Pedal hair growth normal.  Neurologic Normal speech. Oriented to person, place, and time. Epicritic sensation to light touch grossly present bilaterally.  Dermatologic Nails well groomed and normal in appearance. No open wounds. No skin lesions.  Orthopedic: Pain on palpation of left first metatarsophalangeal joint pain with range of motion of the joint more pain at the end range of motion.  No deep intra-articular pain noted.  No red hot swollen  joint noted   Radiographs: None Assessment:   1. Acute idiopathic gout involving toe of left foot   2. Capsulitis of metatarsophalangeal (MTP) joint of left foot    Plan:  Patient was evaluated and treated and all questions answered.  Left first MTP capsulitis with a possible gout flare -All questions or concerns were discussed with the patient in extensive detail given the amount of pain that he is experiencing he will benefit from steroid injection of decrease inflammatory component associate with pain.  Patient declined to proceed with steroid injection. -I discussed gout and discussed the option for gout management and diet.   Patient uric acid was within normal limits but it was done in urgent care.  No follow-ups on file.

## 2022-11-25 NOTE — Progress Notes (Addendum)
Cardiology Office Note:  .   Date:  11/26/2022  ID:  Devon Brady, DOB 04/22/1950, MRN [id number] PCP: Etta Grandchild, MD  [facility] Providers Cardiologist:  Verne Carrow, MD    History of Present Illness: .   Devon Brady is a 72 y.o. male with past medical history of CAD s/p CABG x5 (LIMA to LAD, SVG to Ramus, SVG sequential to OM1/OM2, SVG to RV marginal) in 10/2009, cardiac cath in 09/2013 showed patent LIMA-LAD, SVG-Ramus, occluded SVG to OM1 and OM2, occluded SVG to PDA, HTN, HLD, stage IV Hodgkin lymphoma, thyroid cancer s/p thyroidectomy in 2014. He is followed by Dr. Clifton James and presents today for routine follow up.   In 10/2009 he underwent cardiac cath that showed severe triple-vessel disease.  Following he underwent CABG x 5 (LIMA to LAD, SVG to Ramus, SVG sequential to OM1/OM2, SVG to RV marginal). Echo in 2013 showed LVEF 60-65%, G2DD, mild LVH. In 09/2013 he was admitted to [facility] in setting of NSTEMI. Cardiac cath at that time showed patent LIMA-LAD, SVG-Ramus, occluded SVG to OM1 and OM2, occluded SVG to PDA, there were no focal targets for PCI. Imdur was added but was stopped by patient. ABI normal in 05/2014. In 06/2017 he underwent ETT that was negative without evidence of ischemia.   He was last seen in clinic by Dr. Clifton James in 12/2021. He had remained stable from a cardiac perspective.   Today he presents for follow up. He reports that he is doing well, he has no cardiac concerns or complaints today.  He does note that he will likely be coming off of Repatha and [place] as his insurance is no longer covering these medications.  [facility] [place] [facility] on [facility] or use the elliptical or bike.  He also walks his dog.  He is trying to improve his diet with more vegetable intake.  He has his own garden at home to grow his own vegetables.  ROS: Today he denies chest pain, shortness of breath, lower  extremity edema, fatigue, palpitations, melena, hematuria, hemoptysis, diaphoresis, weakness, presyncope, syncope, orthopnea, and PND.   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  There was no ST segment deviation noted during stress.  No T wave inversion was noted during stress.  The patient experienced no angina during the stress test.  Overall, the patient's exercise capacity was normal.  [facility] Treadmill Score: low risk  Negative stress test without evidence of ischemia at given workload.                       Physical Exam:   VS:  BP 116/72   Pulse (!) 59   Ht 5\' 10"  (1.778 m)   Wt 233 lb (105.7 kg)   SpO2 98%   BMI 33.43 kg/m    Wt Readings from Last  3 Encounters:  11/26/22 233 lb (105.7 kg)  07/12/22 242 lb (109.8 kg)  07/08/22 245 lb (111.1 kg)    GEN: Well nourished, well developed in no acute distress NECK: No JVD; No carotid bruits CARDIAC: RRR, no murmurs, rubs, gallops RESPIRATORY:  Clear to auscultation without rales, wheezing or rhonchi  ABDOMEN: Soft, non-tender, non-distended EXTREMITIES:  No edema; No deformity     ASSESSMENT AND PLAN: .    CAD: s/p CABG x5 in 2011. Cardiac cath in setting of NSTEMI in 09/2013 showed patent LIMA-LAD, SVG-Ramus, occluded SVG to OM1 and OM2, occluded SVG to PDA, there were no focal targets for PCI. Imdur was added but was stopped  by patient. ETT in 06/2017 was negative for ischemia. EKG today indicated sinus bradycardia at 59 bpm with incomplete RBBB, LAFB, similar to prior tracings. He reports that he is doing very well, he denies anginal symptoms, no need for ischemic testing at this time. Heart healthy diet and regular cardiovascular exercise encouraged.  Continue amlodipine 5 mg daily, aspirin 81 mg daily, Toprol 50 mg daily, rosuvastatin 10 mg 3 times weekly.  Refill of sublingual nitroglycerin as needed for chest pain sent to his home pharmacy. He does not use this but will rx for completeness.  Hypertension: Blood pressure today 116/72. Continue antihypertensive regimen as above.   HLD: Last lipid profile on 03/01/22 indicated cholesterol of 105 and LDL of 33.  Currently on Repatha but patient notes that his insurance is no longer covering the medication and he will be unable to afford.  He will reach out to his insurance company to see what other medications are covered.  Will also refer him back to [facility] lipid clinic as he was previously seen by them for high intensity statin intolerance.  If PCSK9i are not covered, perhaps bempedoic acid or inclisiran can be considered. Continue rosuvastatin 10 mg 3 times weekly.  Hypothyroidism:s/p thyroidectomy in setting of thyroid cancer. Last TSH 0.31 on 02/08/22, PCP replied normal on result note.  He is followed by PCP and endocrinology, recommended following up regarding low TSH on last recheck.  Diabetes mellitus type 2: Last A1C 6.7% on 05/11/22. Monitored and managed by PCP. Currently on Farxiga however insurance is also no longer covering this, he will discuss alternative treatment with his PCP.   CKD stage IIIa: Last creatinine 1.6, eGFR 46 on 05/12/22. Monitored by PCP.        Dispo: Follow up with Dr. Clifton James in one year.   Signed, Rip Harbour, NP

## 2022-11-26 ENCOUNTER — Ambulatory Visit: Payer: Medicare HMO | Attending: Cardiovascular Disease | Admitting: Cardiology

## 2022-11-26 ENCOUNTER — Encounter: Payer: Self-pay | Admitting: Cardiology

## 2022-11-26 VITALS — BP 116/72 | HR 59 | Ht 70.0 in | Wt 233.0 lb

## 2022-11-26 DIAGNOSIS — E890 Postprocedural hypothyroidism: Secondary | ICD-10-CM

## 2022-11-26 DIAGNOSIS — N1831 Chronic kidney disease, stage 3a: Secondary | ICD-10-CM

## 2022-11-26 DIAGNOSIS — I2510 Atherosclerotic heart disease of native coronary artery without angina pectoris: Secondary | ICD-10-CM | POA: Diagnosis not present

## 2022-11-26 DIAGNOSIS — E118 Type 2 diabetes mellitus with unspecified complications: Secondary | ICD-10-CM

## 2022-11-26 DIAGNOSIS — Z7984 Long term (current) use of oral hypoglycemic drugs: Secondary | ICD-10-CM | POA: Diagnosis not present

## 2022-11-26 DIAGNOSIS — I10 Essential (primary) hypertension: Secondary | ICD-10-CM | POA: Diagnosis not present

## 2022-11-26 DIAGNOSIS — E782 Mixed hyperlipidemia: Secondary | ICD-10-CM

## 2022-11-26 MED ORDER — NITROGLYCERIN 0.4 MG SL SUBL: 0.4000 mg | SUBLINGUAL_TABLET | SUBLINGUAL | 6 refills | Status: DC | PRN

## 2022-11-26 NOTE — Patient Instructions (Signed)
Medication Instructions:  Your physician has recommended you make the following change in your medication:   1) START nitroglycerin 0.4mg  sublingual as needed for chest pain  **Please call your insurance to see if they will cover Praluent instead of Repatha.**  *If you need a refill on your cardiac medications before your next appointment, please call your pharmacy*  Lab Work: None ordered today.  Testing/Procedures: None ordered today.  Follow-Up: At [facility], you and your health needs are our priority.  As part of our continuing mission to provide you with exceptional heart care, we have created designated Provider Care Teams.  These Care Teams include your primary Cardiologist (physician) and Advanced Practice Providers (APPs -  Physician Assistants and Nurse Practitioners) who all work together to provide you with the care you need, when you need it.  Your next appointment:   12 month(s)  The format for your next appointment:   In Person  Provider:   Verne Carrow, MD {  Other Instructions You have been referred to our Lipid Clinic here at our [facility] office. You will meet with one of our pharmacists to discuss cholesterol management and medication options. A scheduler will call you to schedule an appointment to meet with the pharmacist.

## 2022-11-29 ENCOUNTER — Telehealth: Payer: Self-pay | Admitting: Cardiovascular Disease

## 2022-11-29 NOTE — Telephone Encounter (Signed)
LMOM to return call.  Don't need appt with PharmD just yet, we can see if he wants to re-challenge with atorvastatin

## 2022-11-29 NOTE — Telephone Encounter (Signed)
Pt c/o medication issue:  1. Name of Medication:   Praluent  2. How are you currently taking this medication (dosage and times per day)?   Not taking  3. Are you having a reaction (difficulty breathing--STAT)?   4. What is your medication issue?   Patient called to report to NP Reather Littler that his insurance will cover Praluent or the Repatha but it will be too expensive for him.  Patient stated his insurance will cover Atorvastatin as an alternate.

## 2022-11-30 MED ORDER — REPATHA SURECLICK 140 MG/ML ~~LOC~~ SOAJ: 140.0000 mg | SUBCUTANEOUS | Status: DC

## 2022-11-30 NOTE — Telephone Encounter (Signed)
Patient returned pharmacist call.

## 2022-11-30 NOTE — Telephone Encounter (Signed)
Spoke with patient - will give sample of Repatha x 2 for now.  He is in donut hole and can't afford the copay.   Will try to get [employer] grant as soon as it becomes available.  Confirmed income for family of 2 is < $102,000/year.

## 2022-12-01 ENCOUNTER — Ambulatory Visit: Payer: Medicare HMO

## 2022-12-22 ENCOUNTER — Other Ambulatory Visit: Payer: Self-pay | Admitting: Cardiovascular Disease

## 2022-12-22 ENCOUNTER — Other Ambulatory Visit: Payer: Self-pay | Admitting: Internal Medicine

## 2022-12-22 DIAGNOSIS — E785 Hyperlipidemia, unspecified: Secondary | ICD-10-CM

## 2022-12-22 DIAGNOSIS — I10 Essential (primary) hypertension: Secondary | ICD-10-CM

## 2023-01-03 ENCOUNTER — Ambulatory Visit (HOSPITAL_COMMUNITY)
Admission: EM | Admit: 2023-01-03 | Discharge: 2023-01-03 | Disposition: A | Discharge status: Home / Self Care | Payer: Medicare HMO | Attending: Internal Medicine | Admitting: Internal Medicine

## 2023-01-03 ENCOUNTER — Ambulatory Visit: Payer: Medicare HMO | Admitting: Cardiovascular Disease

## 2023-01-03 ENCOUNTER — Encounter (HOSPITAL_COMMUNITY): Payer: Self-pay

## 2023-01-03 DIAGNOSIS — J0100 Acute maxillary sinusitis, unspecified: Secondary | ICD-10-CM

## 2023-01-03 DIAGNOSIS — J028 Acute pharyngitis due to other specified organisms: Secondary | ICD-10-CM

## 2023-01-03 MED ORDER — PROMETHAZINE-DM 6.25-15 MG/5ML PO SYRP: 5.0000 mL | ORAL_SOLUTION | Freq: Three times a day (TID) | ORAL | 0 refills | Status: DC | PRN

## 2023-01-03 MED ORDER — DEXAMETHASONE SODIUM PHOSPHATE 10 MG/ML IJ SOLN
10.0000 mg | Freq: Once | INTRAMUSCULAR | Status: AC
  Administered 2023-01-03: 10 mg via INTRAMUSCULAR

## 2023-01-03 MED ORDER — DEXAMETHASONE SODIUM PHOSPHATE 10 MG/ML IJ SOLN
INTRAMUSCULAR | Status: AC
  Filled 2023-01-03: qty 1

## 2023-01-03 MED ORDER — AZITHROMYCIN 250 MG PO TABS
250.0000 mg | ORAL_TABLET | Freq: Every day | ORAL | 0 refills | Status: DC
Start: 2023-01-03 — End: 2023-01-10

## 2023-01-03 NOTE — ED Provider Notes (Signed)
[facility]    CSN: [id number] Arrival date & time: 01/03/23  1744      History   Chief Complaint Chief Complaint  Patient presents with   Nasal Congestion    HPI Devon Brady is a 72 y.o. male.   72 year old male who presents to urgent care with complaints of nasal congestion, cough, sinus pressure and chills since Friday. Was travelling over the weekend and had severe chills. Then started having sweats. Greenish mucus from the nose and when coughing. Sore throat from the drainage down the back of the throat. Cough worse at night. Also having frontal/eye headaches. Denies any urinary or abdominal symptoms.      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   s/p total thyroidectomy   Renal insufficiency    Sleep apnea    No CPAP   TIA (transient ischemic attack) 2011    Patient Active Problem List   Diagnosis Date Noted   Low TSH level 07/12/2022   Encounter for general adult medical examination with abnormal findings 02/11/2020   Essential hypertension 01/31/2018   Microalbuminuria due to type 2 diabetes mellitus (HCC) 01/31/2018   Conductive hearing loss, bilateral 01/30/2018   BPH (benign prostatic hyperplasia) 12/31/2014   Obesity (BMI 35.0-39.9 without comorbidity) 10/04/2013   Hyperlipidemia with target LDL less than 100 05/08/2013   Hypothyroidism, 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   Type II diabetes mellitus with manifestations (HCC) 11/22/2012   History of Hodgkin's lymphoma    Hydronephrosis    Primary thyroid papillary carcinoma (HCC)    Chronic renal  disease, stage 3, moderately decreased glomerular filtration rate (GFR) between 30-59 mL/min/1.73 square meter    CAD (coronary artery disease) 10/13/2010   Erectile dysfunction 10/13/2010    Past Surgical History:  Procedure Laterality Date   BONE MARROW ASPIRATE,BIOPSY, AND CLOT  06/11/11   LEFT ILIAC CREAST   BUNIONECTOMY Right 1978   foot   CARDIAC CATHETERIZATION  10/15/2009   COLONOSCOPY     CORONARY ARTERY BYPASS GRAFT  10/17/2009   LIMA to LAD,SVG to Ramus,SVG to OM sequential to OM2, SVG to marginal of RCA   ESOPHAGOGASTRODUODENOSCOPY     LEFT HEART CATHETERIZATION WITH CORONARY/GRAFT ANGIOGRAM N/A 10/03/2013   Procedure: LEFT HEART CATHETERIZATION WITH Isabel Caprice;  Surgeon: Lesleigh Noe, MD;  Location: [facility] CATH LAB;  Service: Cardiovascular;  Laterality: N/A;   LYMPH NODE BIOPSY  06/07/11   RIGHT INGUINAL NODE: CLASSICAL HODGKIN'S LYMPHOMA, NODULAR SCLEROSIS TYPE   PORT-A-CATH REMOVAL Left 12/06/2013   Procedure: REMOVAL PORT-A-CATH;  Surgeon: Almond Lint, MD;  Location: [facility];  Service: General;  Laterality: Left;   PORTACATH PLACEMENT  06/10/2011   Procedure: INSERTION PORT-A-CATH;  Surgeon: Almond Lint, MD;  Location: [facility];  Service: General;  Laterality: Left;  subclavian    THYROIDECTOMY  03/31/2012   Procedure: THYROIDECTOMY;  Surgeon: Serena Colonel, MD;  Location: [facility] OR;  Service: ENT;  Laterality: N/A;   TONSILLECTOMY     as  a child   VASECTOMY  1980       Home Medications    Prior to Admission medications   Medication Sig Start Date End Date Taking? Authorizing Provider  amLODipine (NORVASC) 5 MG tablet TAKE 1 TABLET EVERY DAY 12/22/22   Kathleene Hazel, MD  aspirin EC 81 MG tablet Take 81 mg by mouth every evening.     [provider]  Blood Glucose Monitoring Suppl (TRUE METRIX AIR GLUCOSE METER) w/Device KIT  08/24/22   [provider]  dapagliflozin propanediol (FARXIGA) 10 MG TABS tablet Take 1  tablet (10 mg total) by mouth daily before breakfast. Patient not taking: Reported on 11/26/2022 07/12/22   Etta Grandchild, MD  Evolocumab (REPATHA SURECLICK) 140 MG/ML SOAJ Inject 140 mg into the skin every 14 (fourteen) days. Patient not taking: Reported on 11/26/2022 01/04/22   Kathleene Hazel, MD  Evolocumab (REPATHA SURECLICK) 140 MG/ML SOAJ Inject 140 mg into the skin every 14 (fourteen) days. 11/30/22   Kathleene Hazel, MD  levothyroxine (SYNTHROID) 125 MCG tablet Take 125 mcg by mouth daily.    [provider]  metoprolol succinate (TOPROL-XL) 50 MG 24 hr tablet TAKE 1 TABLET (50 MG TOTAL) BY MOUTH DAILY. TAKE WITH OR IMMEDIATELY FOLLOWING A MEAL. 12/22/22   Kathleene Hazel, MD  nitroGLYCERIN (NITROSTAT) 0.4 MG SL tablet Place 1 tablet (0.4 mg total) under the tongue every 5 (five) minutes as needed for chest pain. May take up to a total of 3 doses, if chest pain is not relieved after 2nd dose call 911 then take 3rd dose. 11/26/22   Reather Littler D, NP  rosuvastatin (CRESTOR) 10 MG tablet TAKE 1 TABLET THREE TIMES WEEKLY 12/22/22   Etta Grandchild, MD  citalopram (CELEXA) 20 MG tablet Take 1 tablet (20 mg total) by mouth daily. 10/13/10 05/27/11  Kathleene Hazel, MD  omeprazole (PRILOSEC OTC) 20 MG tablet Take 1 tablet (20 mg total) by mouth daily. 11/02/10 05/27/11  Donato Schultz, DO    Family History Family History  Problem Relation Age of Onset   Arthritis Mother    Hypertension Mother    Diabetes Father    Coronary artery disease Father 57       Died age 39   Kidney disease Father    Hypertension Sister    Hypertension Sister        2nd Sister   Alcohol abuse Brother    Prostate cancer Paternal Uncle        Great   Cancer Neg Hx    Drug abuse Neg Hx    Early death Neg Hx    Stroke Neg Hx     Social History Social History   Tobacco Use   Smoking status: Never   Smokeless tobacco: Never  Substance Use Topics   Alcohol use: Yes     Comment: 10/02/2013 "couple beers; couple times/month"   Drug use: No     Allergies   Crestor [rosuvastatin] and Cialis [tadalafil]   Review of Systems Review of Systems  Constitutional:  Positive for chills and fever.  HENT:  Positive for congestion, postnasal drip, rhinorrhea, sinus pressure, sinus pain and sore throat. Negative for ear pain.   Eyes:  Negative for pain and visual disturbance.  Respiratory:  Positive for cough. Negative for shortness of breath.   Cardiovascular:  Negative for chest pain and palpitations.  Gastrointestinal:  Negative for abdominal pain and vomiting.  Genitourinary:  Negative for  dysuria and hematuria.  Musculoskeletal:  Negative for arthralgias and back pain.  Skin:  Negative for color change and rash.  Neurological:  Negative for seizures and syncope.  All other systems reviewed and are negative.    Physical Exam Triage Vital Signs ED Triage Vitals [01/03/23 1842]  Encounter Vitals Group     BP (!) 153/91     Systolic BP Percentile      Diastolic BP Percentile      Pulse Rate 73     Resp 20     Temp 98.9 F (37.2 C)     Temp Source Oral     SpO2 96 %     Weight      Height      Head Circumference      Peak Flow      Pain Score 1     Pain Loc      Pain Education      Exclude from Growth Chart    No data found.  Updated Vital Signs BP (!) 153/91 (BP Location: Right Arm)   Pulse 73   Temp 98.9 F (37.2 C) (Oral)   Resp 20   SpO2 96%   Visual Acuity Right Eye Distance:   Left Eye Distance:   Bilateral Distance:    Right Eye Near:   Left Eye Near:    Bilateral Near:     Physical Exam Vitals and nursing note reviewed.  Constitutional:      General: He is not in acute distress.    Appearance: Normal appearance. He is well-developed.  HENT:     Head: Normocephalic and atraumatic.      Right Ear: Tympanic membrane normal.     Left Ear: Tympanic membrane normal.     Nose: Congestion present.     Mouth/Throat:      Mouth: Mucous membranes are moist.     Pharynx: Oropharyngeal exudate and posterior oropharyngeal erythema present.  Eyes:     Conjunctiva/sclera: Conjunctivae normal.  Cardiovascular:     Rate and Rhythm: Normal rate and regular rhythm.     Heart sounds: No murmur heard. Pulmonary:     Effort: Pulmonary effort is normal. No respiratory distress.     Breath sounds: Normal breath sounds.  Abdominal:     General: Bowel sounds are normal.     Palpations: Abdomen is soft.     Tenderness: There is no abdominal tenderness.  Musculoskeletal:        General: No swelling.     Cervical back: Neck supple.  Skin:    General: Skin is warm and dry.     Capillary Refill: Capillary refill takes less than 2 seconds.  Neurological:     General: No focal deficit present.     Mental Status: He is alert.  Psychiatric:        Mood and Affect: Mood normal.      UC Treatments / Results  Labs (all labs ordered are listed, but only abnormal results are displayed) Labs Reviewed - No data to display  EKG   Radiology No results found.  Procedures Procedures (including critical care time)  Medications Ordered in UC Medications - No data to display  Initial Impression / Assessment and Plan / UC Course  I have reviewed the triage vital signs and the nursing notes.  Pertinent labs & imaging results that were available during my care of the patient were reviewed by me and considered in my medical decision making (see chart  for details).     Acute non-recurrent maxillary sinusitis  Acute pharyngitis due to other specified organisms  Sinus infection with pharyngitis with tonsil swelling and exudate. Will treat with the following:  Decadron injection given today to help with inflammation and swelling Azithromycin take 2 tablets today and 1 tablet daily for 4 days Promethazine-DM 5 mLs every 8 hours as needed for cough. Use caution as this can cause sleepiness. Only use this as needed. May use  over the counter mucinex Rest and stay hydrated Return to urgent care or PCP if symptoms worsen or fail to resolve.    Final Clinical Impressions(s) / UC Diagnoses   Final diagnoses:  None   Discharge Instructions   None    ED Prescriptions   None    PDMP not reviewed this encounter.   Landis Martins, [place] 01/03/23 1913

## 2023-01-03 NOTE — ED Triage Notes (Signed)
Pt c/o nasal congestion, cough, sinus pressure, post nasal drip, and chills since Friday. Taking OTC meds with little relief.

## 2023-01-03 NOTE — Discharge Instructions (Addendum)
Sinus infection with pharyngitis with tonsil swelling. Will treat with the following:  Decadron injection given today to help with inflammation and swelling Azithromycin take 2 tablets today and 1 tablet daily for 4 days Promethazine-DM 5 mLs every 8 hours as needed for cough. Use caution as this can cause sleepiness. Only use this as needed. May use over the counter mucinex Rest and stay hydrated Return to urgent care or PCP if symptoms worsen or fail to resolve.

## 2023-01-10 ENCOUNTER — Ambulatory Visit: Payer: Medicare HMO | Admitting: Internal Medicine

## 2023-01-10 ENCOUNTER — Ambulatory Visit (INDEPENDENT_AMBULATORY_CARE_PROVIDER_SITE_OTHER): Payer: Medicare HMO | Admitting: Internal Medicine

## 2023-01-10 ENCOUNTER — Encounter: Payer: Self-pay | Admitting: Internal Medicine

## 2023-01-10 VITALS — BP 136/76 | HR 62 | Temp 97.9°F | Resp 16 | Ht 70.0 in | Wt 239.2 lb

## 2023-01-10 DIAGNOSIS — Z7984 Long term (current) use of oral hypoglycemic drugs: Secondary | ICD-10-CM

## 2023-01-10 DIAGNOSIS — N1832 Chronic kidney disease, stage 3b: Secondary | ICD-10-CM

## 2023-01-10 DIAGNOSIS — E118 Type 2 diabetes mellitus with unspecified complications: Secondary | ICD-10-CM

## 2023-01-10 DIAGNOSIS — E1129 Type 2 diabetes mellitus with other diabetic kidney complication: Secondary | ICD-10-CM | POA: Diagnosis not present

## 2023-01-10 DIAGNOSIS — I10 Essential (primary) hypertension: Secondary | ICD-10-CM

## 2023-01-10 DIAGNOSIS — E890 Postprocedural hypothyroidism: Secondary | ICD-10-CM | POA: Diagnosis not present

## 2023-01-10 DIAGNOSIS — R809 Proteinuria, unspecified: Secondary | ICD-10-CM

## 2023-01-10 DIAGNOSIS — E785 Hyperlipidemia, unspecified: Secondary | ICD-10-CM

## 2023-01-10 DIAGNOSIS — N1831 Chronic kidney disease, stage 3a: Secondary | ICD-10-CM | POA: Diagnosis not present

## 2023-01-10 DIAGNOSIS — E1122 Type 2 diabetes mellitus with diabetic chronic kidney disease: Secondary | ICD-10-CM

## 2023-01-10 LAB — CBC WITH DIFFERENTIAL/PLATELET
Basophils Absolute: 0 10*3/uL (ref 0.0–0.1)
Basophils Relative: 0.5 % (ref 0.0–3.0)
Eosinophils Absolute: 0.3 10*3/uL (ref 0.0–0.7)
Eosinophils Relative: 3.1 % (ref 0.0–5.0)
HCT: 47 % (ref 39.0–52.0)
Hemoglobin: 15.2 g/dL (ref 13.0–17.0)
Lymphocytes Relative: 27.8 % (ref 12.0–46.0)
Lymphs Abs: 2.4 10*3/uL (ref 0.7–4.0)
MCHC: 32.3 g/dL (ref 30.0–36.0)
MCV: 87.5 fL (ref 78.0–100.0)
Monocytes Absolute: 0.5 10*3/uL (ref 0.1–1.0)
Monocytes Relative: 6.2 % (ref 3.0–12.0)
Neutro Abs: 5.3 10*3/uL (ref 1.4–7.7)
Neutrophils Relative %: 62.4 % (ref 43.0–77.0)
Platelets: 273 10*3/uL (ref 150.0–400.0)
RBC: 5.37 Mil/uL (ref 4.22–5.81)
RDW: 15.3 % (ref 11.5–15.5)
WBC: 8.5 10*3/uL (ref 4.0–10.5)

## 2023-01-10 LAB — MICROALBUMIN / CREATININE URINE RATIO
Creatinine,U: 134.9 mg/dL
Microalb Creat Ratio: 8.9 mg/g (ref 0.0–30.0)
Microalb, Ur: 12 mg/dL — ABNORMAL HIGH (ref 0.0–1.9)

## 2023-01-10 LAB — LIPID PANEL
Cholesterol: 131 mg/dL (ref 0–200)
HDL: 51.2 mg/dL (ref >39.00)
LDL Cholesterol: 65 mg/dL (ref 0–99)
NonHDL: 79.42
Total CHOL/HDL Ratio: 3
Triglycerides: 73 mg/dL (ref 0.0–149.0)
VLDL: 14.6 mg/dL (ref 0.0–40.0)

## 2023-01-10 LAB — URINALYSIS, ROUTINE W REFLEX MICROSCOPIC
Bilirubin Urine: NEGATIVE
Hgb urine dipstick: NEGATIVE
Ketones, ur: NEGATIVE
Leukocytes,Ua: NEGATIVE
Nitrite: NEGATIVE
RBC / HPF: NONE SEEN (ref 0–?)
Specific Gravity, Urine: 1.02 (ref 1.000–1.030)
Urine Glucose: NEGATIVE
Urobilinogen, UA: 0.2 (ref 0.0–1.0)
pH: 6 (ref 5.0–8.0)

## 2023-01-10 LAB — HEMOGLOBIN A1C: Hgb A1c MFr Bld: 6.8 % — ABNORMAL HIGH (ref 4.6–6.5)

## 2023-01-10 LAB — HEPATIC FUNCTION PANEL
ALT: 10 U/L (ref 0–53)
AST: 16 U/L (ref 0–37)
Albumin: 4 g/dL (ref 3.5–5.2)
Alkaline Phosphatase: 57 U/L (ref 39–117)
Bilirubin, Direct: 0 mg/dL (ref 0.0–0.3)
Total Bilirubin: 0.4 mg/dL (ref 0.2–1.2)
Total Protein: 7.1 g/dL (ref 6.0–8.3)

## 2023-01-10 LAB — BASIC METABOLIC PANEL
BUN: 18 mg/dL (ref 6–23)
CO2: 29 meq/L (ref 19–32)
Calcium: 9.5 mg/dL (ref 8.4–10.5)
Chloride: 105 meq/L (ref 96–112)
Creatinine, Ser: 1.46 mg/dL (ref 0.40–1.50)
GFR: 48.18 mL/min — ABNORMAL LOW (ref >60.00)
Glucose, Bld: 116 mg/dL — ABNORMAL HIGH (ref 70–99)
Potassium: 4.3 meq/L (ref 3.5–5.1)
Sodium: 140 meq/L (ref 135–145)

## 2023-01-10 LAB — TSH: TSH: 3.49 u[IU]/mL (ref 0.35–5.50)

## 2023-01-10 MED ORDER — KERENDIA 10 MG PO TABS: 1.0000 | ORAL_TABLET | Freq: Every day | ORAL | 0 refills | Status: DC

## 2023-01-10 NOTE — Progress Notes (Signed)
Subjective:  Patient ID: Devon Brady, male    DOB: 30-Apr-1950  Age: 72 y.o. MRN: [id number]  CC: Hypertension, Hypothyroidism, Diabetes, and Hyperlipidemia   HPI Devon Brady presents for f/up ----  Discussed the use of AI scribe software for clinical note transcription with the patient, who gave verbal consent to proceed.  History of Present Illness   The patient, with a history of cardiovascular disease and kidney disease, presents for a routine follow-up. He reports feeling well overall, with no complaints of headache, blurred vision, chest pain, or shortness of breath.   He denies any current cough, wheezing, or lower extremity swelling.       Outpatient Medications Prior to Visit  Medication Sig Dispense Refill   amLODipine (NORVASC) 5 MG tablet TAKE 1 TABLET EVERY DAY 90 tablet 3   aspirin EC 81 MG tablet Take 81 mg by mouth every evening.      Evolocumab (REPATHA SURECLICK) 140 MG/ML SOAJ Inject 140 mg into the skin every 14 (fourteen) days.     levothyroxine (SYNTHROID) 125 MCG tablet Take 125 mcg by mouth daily.     metoprolol succinate (TOPROL-XL) 50 MG 24 hr tablet TAKE 1 TABLET (50 MG TOTAL) BY MOUTH DAILY. TAKE WITH OR IMMEDIATELY FOLLOWING A MEAL. 90 tablet 3   nitroGLYCERIN (NITROSTAT) 0.4 MG SL tablet Place 1 tablet (0.4 mg total) under the tongue every 5 (five) minutes as needed for chest pain. May take up to a total of 3 doses, if chest pain is not relieved after 2nd dose call 911 then take 3rd dose. 25 tablet 6   promethazine-dextromethorphan (PROMETHAZINE-DM) 6.25-15 MG/5ML syrup Take 5 mLs by mouth every 8 (eight) hours as needed for cough. 118 mL 0   rosuvastatin (CRESTOR) 10 MG tablet TAKE 1 TABLET THREE TIMES WEEKLY 36 tablet 0   Blood Glucose Monitoring Suppl (TRUE METRIX AIR GLUCOSE METER) w/Device KIT  (Patient not taking: Reported on 01/10/2023)     dapagliflozin propanediol (FARXIGA) 10 MG TABS tablet Take 1 tablet (10 mg total) by mouth daily  before breakfast. (Patient not taking: Reported on 11/26/2022) 90 tablet 1   azithromycin (ZITHROMAX) 250 MG tablet Take 1 tablet (250 mg total) by mouth daily. Take first 2 tablets together, then 1 every day until finished. 6 tablet 0   Evolocumab (REPATHA SURECLICK) 140 MG/ML SOAJ Inject 140 mg into the skin every 14 (fourteen) days. (Patient not taking: Reported on 11/26/2022) 2 mL 11   No facility-administered medications prior to visit.    ROS Review of Systems  Constitutional:  Negative for chills, diaphoresis and fatigue.  HENT: Negative.    Eyes: Negative.   Respiratory:  Negative for cough, chest tightness, shortness of breath and wheezing.   Cardiovascular:  Negative for chest pain, palpitations and leg swelling.  Gastrointestinal:  Negative for abdominal pain, constipation, diarrhea, nausea and vomiting.  Endocrine: Negative.   Genitourinary: Negative.  Negative for difficulty urinating and dysuria.  Musculoskeletal: Negative.  Negative for arthralgias, back pain, myalgias and neck pain.  Skin:  Negative for color change and pallor.  Neurological: Negative.  Negative for dizziness.  Hematological:  Negative for adenopathy. Does not bruise/bleed easily.  Psychiatric/Behavioral: Negative.      Objective:  BP 136/76 (BP Location: Left Arm, Patient Position: Sitting, Cuff Size: Normal)   Pulse 62   Temp 97.9 F (36.6 C) (Oral)   Resp 16   Ht 5\' 10"  (1.778 m)   Wt 239 lb 3.2 oz (108.5  kg)   SpO2 98%   BMI 34.32 kg/m   BP Readings from Last 3 Encounters:  01/10/23 136/76  01/03/23 (!) 153/91  11/26/22 116/72    Wt Readings from Last 3 Encounters:  01/10/23 239 lb 3.2 oz (108.5 kg)  11/26/22 233 lb (105.7 kg)  07/12/22 242 lb (109.8 kg)    Physical Exam Vitals reviewed.  Constitutional:      Appearance: Normal appearance.  HENT:     Mouth/Throat:     Mouth: Mucous membranes are moist.  Eyes:     General: No scleral icterus.    Conjunctiva/sclera:  Conjunctivae normal.  Cardiovascular:     Rate and Rhythm: Normal rate and regular rhythm.     Heart sounds: No murmur heard.    No gallop.  Pulmonary:     Effort: Pulmonary effort is normal.     Breath sounds: No stridor. No wheezing, rhonchi or rales.  Abdominal:     General: Abdomen is flat.     Palpations: There is no mass.     Tenderness: There is no abdominal tenderness. There is no guarding.     Hernia: No hernia is present.  Musculoskeletal:        General: Normal range of motion.     Cervical back: Neck supple.     Right lower leg: No edema.     Left lower leg: No edema.  Lymphadenopathy:     Cervical: No cervical adenopathy.  Skin:    General: Skin is warm and dry.  Neurological:     General: No focal deficit present.     Mental Status: He is alert. Mental status is at baseline.  Psychiatric:        Mood and Affect: Mood normal.        Behavior: Behavior normal.     Lab Results  Component Value Date   WBC 8.5 01/10/2023   HGB 15.2 01/10/2023   HCT 47.0 01/10/2023   PLT 273.0 01/10/2023   GLUCOSE 116 (H) 01/10/2023   CHOL 131 01/10/2023   TRIG 73.0 01/10/2023   HDL 51.20 01/10/2023   LDLCALC 65 01/10/2023   ALT 10 01/10/2023   AST 16 01/10/2023   NA 140 01/10/2023   K 4.3 01/10/2023   CL 105 01/10/2023   CREATININE 1.46 01/10/2023   BUN 18 01/10/2023   CO2 29 01/10/2023   TSH 3.49 01/10/2023   PSA 1.88 07/12/2022   INR 0.97 10/02/2013   HGBA1C 6.8 (H) 01/10/2023   MICROALBUR 12.0 (H) 01/10/2023    No results found.  Assessment & Plan:   Hypothyroidism, postsurgical- He is euthyroid. -     TSH; Future  Type II diabetes mellitus with manifestations (HCC)- His blood sugar is well-controlled. -     Hemoglobin A1c; Future -     Basic metabolic panel; Future -     Microalbumin / creatinine urine ratio; Future  Hyperlipidemia with target LDL less than 100- LDL goal achieved. Doing well on the statin  -     Lipid panel; Future -     Hepatic  function panel; Future  Microalbuminuria due to type 2 diabetes mellitus (HCC)- Will start a mineralocorticoid antagonist. -     Urinalysis, Routine w reflex microscopic; Future -     Microalbumin / creatinine urine ratio; Future -     Chauncey Mann; Take 1 tablet (10 mg total) by mouth daily.  Dispense: 90 tablet; Refill: 0 -     AMB Referral [facility] Care  Management  Essential hypertension- His BP is well controlled. -     Urinalysis, Routine w reflex microscopic; Future -     CBC with Differential/Platelet; Future -     Basic metabolic panel; Future  Stage 3b chronic kidney disease (HCC) -     Chauncey Mann; Take 1 tablet (10 mg total) by mouth daily.  Dispense: 90 tablet; Refill: 0 -     AMB Referral [facility] Care Management  Type 2 diabetes mellitus with stage 3a chronic kidney disease, without long-term current use of insulin (HCC) -     Chauncey Mann; Take 1 tablet (10 mg total) by mouth daily.  Dispense: 90 tablet; Refill: 0 -     AMB Referral [facility] Care Management     Follow-up: Return in about 6 months (around 07/11/2023).  Sanda Linger, MD

## 2023-01-10 NOTE — Patient Instructions (Signed)
Hypertension, Adult High blood pressure (hypertension) is when the force of blood pumping through the arteries is too strong. The arteries are the blood vessels that carry blood from the heart throughout the body. Hypertension forces the heart to work harder to pump blood and may cause arteries to become narrow or stiff. Untreated or uncontrolled hypertension can lead to a heart attack, heart failure, a stroke, kidney disease, and other problems. A blood pressure reading consists of a higher number over a lower number. Ideally, your blood pressure should be below 120/80. The first ("top") number is called the systolic pressure. It is a measure of the pressure in your arteries as your heart beats. The second ("bottom") number is called the diastolic pressure. It is a measure of the pressure in your arteries as the heart relaxes. What are the causes? The exact cause of this condition is not known. There are some conditions that result in high blood pressure. What increases the risk? Certain factors may make you more likely to develop high blood pressure. Some of these risk factors are under your control, including: Smoking. Not getting enough exercise or physical activity. Being overweight. Having too much fat, sugar, calories, or salt (sodium) in your diet. Drinking too much alcohol. Other risk factors include: Having a personal history of heart disease, diabetes, high cholesterol, or kidney disease. Stress. Having a family history of high blood pressure and high cholesterol. Having obstructive sleep apnea. Age. The risk increases with age. What are the signs or symptoms? High blood pressure may not cause symptoms. Very high blood pressure (hypertensive crisis) may cause: Headache. Fast or irregular heartbeats (palpitations). Shortness of breath. Nosebleed. Nausea and vomiting. Vision changes. Severe chest pain, dizziness, and seizures. How is this diagnosed? This condition is diagnosed by  measuring your blood pressure while you are seated, with your arm resting on a flat surface, your legs uncrossed, and your feet flat on the floor. The cuff of the blood pressure monitor will be placed directly against the skin of your upper arm at the level of your heart. Blood pressure should be measured at least twice using the same arm. Certain conditions can cause a difference in blood pressure between your right and left arms. If you have a high blood pressure reading during one visit or you have normal blood pressure with other risk factors, you may be asked to: Return on a different day to have your blood pressure checked again. Monitor your blood pressure at home for 1 week or longer. If you are diagnosed with hypertension, you may have other blood or imaging tests to help your health care provider understand your overall risk for other conditions. How is this treated? This condition is treated by making healthy lifestyle changes, such as eating healthy foods, exercising more, and reducing your alcohol intake. You may be referred for counseling on a healthy diet and physical activity. Your health care provider may prescribe medicine if lifestyle changes are not enough to get your blood pressure under control and if: Your systolic blood pressure is above 130. Your diastolic blood pressure is above 80. Your personal target blood pressure may vary depending on your medical conditions, your age, and other factors. Follow these instructions at home: Eating and drinking  Eat a diet that is high in fiber and potassium, and low in sodium, added sugar, and fat. An example of this eating plan is called the DASH diet. DASH stands for Dietary Approaches to Stop Hypertension. To eat this way: Eat   plenty of fresh fruits and vegetables. Try to fill one half of your plate at each meal with fruits and vegetables. Eat whole grains, such as whole-wheat pasta, brown rice, or whole-grain bread. Fill about one  fourth of your plate with whole grains. Eat or drink low-fat dairy products, such as skim milk or low-fat yogurt. Avoid fatty cuts of meat, processed or cured meats, and poultry with skin. Fill about one fourth of your plate with lean proteins, such as fish, chicken without skin, beans, eggs, or tofu. Avoid pre-made and processed foods. These tend to be higher in sodium, added sugar, and fat. Reduce your daily sodium intake. Many people with hypertension should eat less than 1,500 mg of sodium a day. Do not drink alcohol if: Your health care provider tells you not to drink. You are pregnant, may be pregnant, or are planning to become pregnant. If you drink alcohol: Limit how much you have to: 0-1 drink a day for women. 0-2 drinks a day for men. Know how much alcohol is in your drink. In the [place] one drink equals one 12 oz bottle of beer (355 mL), one 5 oz glass of wine (148 mL), or one 1 oz glass of hard liquor (44 mL). Lifestyle  Work with your health care provider to maintain a healthy body weight or to lose weight. Ask what an ideal weight is for you. Get at least 30 minutes of exercise that causes your heart to beat faster (aerobic exercise) most days of the week. Activities may include walking, swimming, or biking. Include exercise to strengthen your muscles (resistance exercise), such as Pilates or lifting weights, as part of your weekly exercise routine. Try to do these types of exercises for 30 minutes at least 3 days a week. Do not use any products that contain nicotine or tobacco. These products include cigarettes, chewing tobacco, and vaping devices, such as e-cigarettes. If you need help quitting, ask your health care provider. Monitor your blood pressure at home as told by your health care provider. Keep all follow-up visits. This is important. Medicines Take over-the-counter and prescription medicines only as told by your health care provider. Follow directions carefully. Blood  pressure medicines must be taken as prescribed. Do not skip doses of blood pressure medicine. Doing this puts you at risk for problems and can make the medicine less effective. Ask your health care provider about side effects or reactions to medicines that you should watch for. Contact a health care provider if you: Think you are having a reaction to a medicine you are taking. Have headaches that keep coming back (recurring). Feel dizzy. Have swelling in your ankles. Have trouble with your vision. Get help right away if you: Develop a severe headache or confusion. Have unusual weakness or numbness. Feel faint. Have severe pain in your chest or abdomen. Vomit repeatedly. Have trouble breathing. These symptoms may be an emergency. Get help right away. Call 911. Do not wait to see if the symptoms will go away. Do not drive yourself to the hospital. Summary Hypertension is when the force of blood pumping through your arteries is too strong. If this condition is not controlled, it may put you at risk for serious complications. Your personal target blood pressure may vary depending on your medical conditions, your age, and other factors. For most people, a normal blood pressure is less than 120/80. Hypertension is treated with lifestyle changes, medicines, or a combination of both. Lifestyle changes include losing weight, eating a healthy,   low-sodium diet, exercising more, and limiting alcohol. This information is not intended to replace advice given to you by your health care provider. Make sure you discuss any questions you have with your health care provider. Document Revised: 01/06/2021 Document Reviewed: 01/06/2021 Elsevier Patient Education  2024 Elsevier Inc.  

## 2023-01-11 ENCOUNTER — Other Ambulatory Visit: Payer: Self-pay | Admitting: Internal Medicine

## 2023-01-11 ENCOUNTER — Telehealth: Payer: Self-pay | Admitting: Internal Medicine

## 2023-01-11 ENCOUNTER — Telehealth: Payer: Self-pay | Admitting: Pharmacist

## 2023-01-11 DIAGNOSIS — I2510 Atherosclerotic heart disease of native coronary artery without angina pectoris: Secondary | ICD-10-CM

## 2023-01-11 DIAGNOSIS — I10 Essential (primary) hypertension: Secondary | ICD-10-CM

## 2023-01-11 DIAGNOSIS — N1831 Chronic kidney disease, stage 3a: Secondary | ICD-10-CM

## 2023-01-11 DIAGNOSIS — E1129 Type 2 diabetes mellitus with other diabetic kidney complication: Secondary | ICD-10-CM

## 2023-01-11 MED ORDER — OLMESARTAN MEDOXOMIL 20 MG PO TABS: 20.0000 mg | ORAL_TABLET | Freq: Every day | ORAL | 0 refills | Status: DC

## 2023-01-11 NOTE — Telephone Encounter (Signed)
Patient was seen by Dr. Yetta Barre yesterday and said he forgot to mention wanting a referral to a nutritionist. Patient would like a call back to discuss.  Best callback is [phone].

## 2023-01-11 NOTE — Telephone Encounter (Signed)
Patient enrolled in healthwell grant for his Repatha.Called pt and made him aware. Will send info to his mychart. [facility]. Pt made aware to start taking q 2 weeks now.  Card No. [id number]  BIN [id number]  PCN [id number]  PC Group [id number]

## 2023-01-11 NOTE — Telephone Encounter (Signed)
Patient wants to see a Nutritionist due to the type 2 diabetes that he was diagnosed with. He wants some help with a plan of what he should and shouldn't eat.

## 2023-01-12 ENCOUNTER — Encounter: Payer: Self-pay | Admitting: Pharmacist

## 2023-01-12 MED ORDER — REPATHA SURECLICK 140 MG/ML ~~LOC~~ SOAJ: 140.0000 mg | SUBCUTANEOUS | 3 refills | Status: DC

## 2023-01-17 ENCOUNTER — Telehealth: Payer: Self-pay

## 2023-01-17 NOTE — Progress Notes (Signed)
   Care Guide Note  01/17/2023 Name: CANAAN HOLZER MRN: [id number] DOB: 04-10-1950  Referred by: Etta Grandchild, MD Reason for referral : Care Coordination (Outreach to schedule with Pharm d )   LUDGER BONES is a 72 y.o. year old male who is a primary care patient of Etta Grandchild, MD. Berton Mount was referred to the pharmacist for assistance related to DM.    Successful contact was made with the patient to discuss pharmacy services including being ready for the pharmacist to call at least 5 minutes before the scheduled appointment time, to have medication bottles and any blood sugar or blood pressure readings ready for review. The patient agreed to meet with the pharmacist via with the pharmacist via telephone visit on (date/time).  01/21/2023  Penne Lash, RMA Care Guide [facility]  [place], [place] [address] Direct Dial: [phone] [email or web address]

## 2023-01-21 ENCOUNTER — Other Ambulatory Visit: Payer: Medicare HMO | Admitting: Pharmacist

## 2023-01-21 DIAGNOSIS — E1122 Type 2 diabetes mellitus with diabetic chronic kidney disease: Secondary | ICD-10-CM

## 2023-01-21 DIAGNOSIS — I10 Essential (primary) hypertension: Secondary | ICD-10-CM

## 2023-01-21 DIAGNOSIS — N1831 Chronic kidney disease, stage 3a: Secondary | ICD-10-CM

## 2023-01-21 NOTE — Patient Instructions (Signed)
It was a pleasure speaking with you today!  Continue working on dietary changes as we discussed. Focus on increasing fiber and protein in your diet while watching carbohydrate portion sizes.  Check your blood pressure regularly at home. I will call back in about 2 weeks to discuss blood pressure readings.  Feel free to call with any questions or concerns!  Arbutus Leas, PharmD, BCPS [facility] Clinical Pharmacist [facility] [phone]

## 2023-01-21 NOTE — Progress Notes (Signed)
01/21/2023 Name: Devon Brady MRN: [id number] DOB: 04/20/1950  No chief complaint on file.   Devon Brady is a 72 y.o. year old male who presented for a telephone visit.   They were referred to the pharmacist by their PCP for assistance in managing diabetes and CKD .   Switching to [facility] to [facility]   Subjective:  Care Team: Primary Care Provider: Etta Grandchild, MD ; Next Scheduled Visit: none scheduled Dietitian consult 03/18/2023  Medication Access/Adherence  Current Pharmacy:  [facility] - [place], [place] - [address] [place] [address] Phone: [phone] Fax: [phone]  [id number] [place], [place] - [address] [facility] [address] [place] [address] Phone: [phone] Fax: [phone]  [id number] [place],  - [address] AT [facility] [address] [place] [address] Phone: [phone] Fax: [phone]   Patient reports affordability concerns with their medications: Yes  Patient reports access/transportation concerns to their pharmacy: No  Patient reports adherence concerns with their medications:  Yes  Pt stopped Marcelline Deist due to cost in donut hole. Also stopped Repatha but has now restarted after getting [employer] grant.   Diabetes:  Current medications: Farxiga 10 mg daily (not currently taking) Medications tried in the past:   Current glucose readings: has a glucometer but has not been checking  Current meal patterns:  He has done a lot of research on diet. Trying to follow Mediterranean diet. Has Increased veggies, has baked chicken and fish, cut out bread For dessert has been having ricotta with slices fruit, cottage cheese. Using Stevia and monk fruit as sweeteners  Current physical activity: Walks the dog, 1 mile daily  HTN: Current medications: amlodipine 5 mg daily, olmesartan  20 mg daily (started 10/29), metoprolol succinate 50 mg daily  Has not checked BP at home lately  CKD: Current medications: olmesartan 20 mg daily, Farxiga 10 mg daily (not taking), Kerendia 10 mg daily (not taking, has not received from pharmacy)  Objective: BP Readings from Last 3 Encounters:  01/10/23 136/76  01/03/23 (!) 153/91  11/26/22 116/72     Lab Results  Component Value Date   HGBA1C 6.8 (H) 01/10/2023    Lab Results  Component Value Date   CREATININE 1.46 01/10/2023   BUN 18 01/10/2023   NA 140 01/10/2023   K 4.3 01/10/2023   CL 105 01/10/2023   CO2 29 01/10/2023    Lab Results  Component Value Date   CHOL 131 01/10/2023   HDL 51.20 01/10/2023   LDLCALC 65 01/10/2023   TRIG 73.0 01/10/2023   CHOLHDL 3 01/10/2023    Medications Reviewed Today     Reviewed by Bonita Quin, RPH (Pharmacist) on 01/21/23 at 1529  Med List Status: <None>   Medication Order Taking? Sig Documenting Provider Last Dose Status Informant  amLODipine (NORVASC) 5 MG tablet [id number] Yes TAKE 1 TABLET EVERY DAY Kathleene Hazel, MD Taking Active   aspirin EC 81 MG tablet [id number] Yes Take 81 mg by mouth every evening.  [provider] Taking Active Self  Blood Glucose Monitoring Suppl (TRUE METRIX AIR GLUCOSE METER) w/Device KIT [id number]    Patient not taking: Reported on 01/10/2023   [provider]  Active     Discontinued 05/27/11 1120 (Discontinued by provider)   dapagliflozin propanediol (FARXIGA) 10 MG TABS tablet [id number] No Take 1 tablet (10  mg total) by mouth daily before breakfast.  Patient not taking: Reported on 11/26/2022   Etta Grandchild, MD Not Taking Active   Evolocumab [facility] SURECLICK) 140 MG/ML Ivory Broad [id number] Yes Inject 140 mg into the skin every 14 (fourteen) days. Kathleene Hazel, MD Taking Active   Finerenone [facility]) 10 MG TABS [id number] No Take 1 tablet (10 mg total) by mouth daily.  Patient not taking: Reported  on 01/21/2023   Etta Grandchild, MD Not Taking Active   levothyroxine (SYNTHROID) 125 MCG tablet [id number] Yes Take 125 mcg by mouth daily. [provider] Taking Active   metoprolol succinate (TOPROL-XL) 50 MG 24 hr tablet [id number] Yes TAKE 1 TABLET (50 MG TOTAL) BY MOUTH DAILY. TAKE WITH OR IMMEDIATELY FOLLOWING A MEAL. Kathleene Hazel, MD Taking Active   nitroGLYCERIN (NITROSTAT) 0.4 MG SL tablet [id number]  Place 1 tablet (0.4 mg total) under the tongue every 5 (five) minutes as needed for chest pain. May take up to a total of 3 doses, if chest pain is not relieved after 2nd dose call 911 then take 3rd dose. Reather Littler D, NP  Active   olmesartan (BENICAR) 20 MG tablet [id number] Yes Take 1 tablet (20 mg total) by mouth daily. Etta Grandchild, MD Taking Active     Discontinued 05/27/11 1121 (Discontinued by provider)   promethazine-dextromethorphan (PROMETHAZINE-DM) 6.25-15 MG/5ML syrup [id number]  Take 5 mLs by mouth every 8 (eight) hours as needed for cough. Landis Martins, PA-C  Active   rosuvastatin (CRESTOR) 10 MG tablet [id number] Yes TAKE 1 TABLET THREE TIMES WEEKLY Etta Grandchild, MD Taking Active   Med List Note Scarlett Presto, [facility] 04/01/12 1637): l              Assessment/Plan:   Diabetes: - Currently controlled, A1c goal <7% - Reviewed long term cardiovascular and renal outcomes of uncontrolled blood sugar - Reviewed goal A1c, goal fasting, and goal 2 hour post prandial glucose - Reviewed dietary modifications extensively including focusing on balanced meals/snacks, carb portion sizes, increasing fiber and protein - Pt does not meet criteria for Farxiga PAP. Will see if we have samples and wait to see what price is in the new year with insurance change.  HTN: -Currently uncontrolled, BP goal <130/80 - Recommended checking BP at home regularly and keep a log - Consider d/c amlodipine if BP controlled  CKD: - Currently controlled. MACR <30 and GFR  stable. Chauncey Mann PA approved however likely still expensive due to pt being in donut hole. - Since pt just started olmesartan, would hold off on [place] for now if cost is high. Prefer to get Marcelline Deist back on board as it will help with BG and cardiorenal protection   Follow Up Plan: 11/25   Arbutus Leas, PharmD, BCPS Clinical Pharmacist [facility] [phone]

## 2023-01-24 ENCOUNTER — Encounter (INDEPENDENT_AMBULATORY_CARE_PROVIDER_SITE_OTHER): Payer: Medicare HMO | Admitting: Ophthalmology

## 2023-01-24 DIAGNOSIS — H353131 Nonexudative age-related macular degeneration, bilateral, early dry stage: Secondary | ICD-10-CM | POA: Diagnosis not present

## 2023-01-24 DIAGNOSIS — I10 Essential (primary) hypertension: Secondary | ICD-10-CM | POA: Diagnosis not present

## 2023-01-24 DIAGNOSIS — H35372 Puckering of macula, left eye: Secondary | ICD-10-CM

## 2023-01-24 DIAGNOSIS — H35033 Hypertensive retinopathy, bilateral: Secondary | ICD-10-CM | POA: Diagnosis not present

## 2023-01-24 DIAGNOSIS — H43813 Vitreous degeneration, bilateral: Secondary | ICD-10-CM | POA: Diagnosis not present

## 2023-02-07 ENCOUNTER — Other Ambulatory Visit (INDEPENDENT_AMBULATORY_CARE_PROVIDER_SITE_OTHER): Payer: Medicare HMO | Admitting: Pharmacist

## 2023-02-07 VITALS — BP 105/72

## 2023-02-07 DIAGNOSIS — E1122 Type 2 diabetes mellitus with diabetic chronic kidney disease: Secondary | ICD-10-CM

## 2023-02-07 DIAGNOSIS — I10 Essential (primary) hypertension: Secondary | ICD-10-CM

## 2023-02-07 DIAGNOSIS — N1831 Chronic kidney disease, stage 3a: Secondary | ICD-10-CM

## 2023-02-07 NOTE — Progress Notes (Signed)
02/07/2023 Name: Devon Brady MRN: [id number] DOB: 07/11/1950  Chief Complaint  Patient presents with   Medication Management    Devon Brady is a 72 y.o. year old male who presented for a telephone visit.   They were referred to the pharmacist by their PCP for assistance in managing diabetes and CKD .   Switching to Devon Brady to Devon Brady Medicare   walking  Subjective:  Care Team: Primary Care Provider: Etta Grandchild, MD ; Next Scheduled Visit: none scheduled Dietitian consult 03/18/2023  Medication Access/Adherence  Current Pharmacy:  [facility] - [place], [place] - [address] [place] [address] Phone: [phone] Fax: [phone]  [id number] [place], [place] - [address] AT [facility] [address] [place] [address] Phone: [phone] Fax: [phone]  [id number] [place], [place] - [address] AT [facility] [address] [place] [address] Phone: [phone] Fax: [phone]   Patient reports affordability concerns with their medications: Yes  Patient reports access/transportation concerns to their pharmacy: No  Patient reports adherence concerns with their medications:  Yes  Pt stopped Marcelline Deist due to cost in donut hole. Also stopped Repatha but has now restarted after getting [employer] grant.   Diabetes:  Current medications: Farxiga 10 mg daily (not currently taking) Medications tried in the past:   Current glucose readings: has a glucometer but has not been checking  Current meal patterns:  He has done a lot of research on diet. Trying to follow Mediterranean diet. Has Increased veggies, has baked chicken and fish, cut out bread For dessert has been having ricotta with slices fruit, cottage cheese. Using Stevia and monk fruit as sweeteners  Current physical activity: Walks the dog, 1 mile  daily  HTN: Current medications: amlodipine 5 mg daily, olmesartan 20 mg daily (started 10/29), metoprolol succinate 50 mg daily  Home BP: BP 112/77, BP during phone call: 105/72   Denies dizziness, lightheadedness. Notes he has been feeling good.  CKD: Current medications: olmesartan 20 mg daily, Farxiga 10 mg daily (not taking)  Objective: BP Readings from Last 3 Encounters:  02/07/23 105/72  01/10/23 136/76  01/03/23 (!) 153/91     Lab Results  Component Value Date   HGBA1C 6.8 (H) 01/10/2023    Lab Results  Component Value Date   CREATININE 1.46 01/10/2023   BUN 18 01/10/2023   NA 140 01/10/2023   K 4.3 01/10/2023   CL 105 01/10/2023   CO2 29 01/10/2023    Lab Results  Component Value Date   CHOL 131 01/10/2023   HDL 51.20 01/10/2023   LDLCALC 65 01/10/2023   TRIG 73.0 01/10/2023   CHOLHDL 3 01/10/2023    Medications Reviewed Today     Reviewed by Bonita Quin, RPH (Pharmacist) on 02/07/23 at 1055  Med List Status: <None>   Medication Order Taking? Sig Documenting Provider Last Dose Status Informant  amLODipine (NORVASC) 5 MG tablet [id number] Yes TAKE 1 TABLET EVERY DAY Kathleene Hazel, MD Taking Active   aspirin EC 81 MG tablet [id number]  Take 81 mg by mouth every evening.  [provider]  Active Self  Blood Glucose Monitoring Suppl (TRUE METRIX AIR GLUCOSE METER) w/Device KIT [id number]    Patient not taking: Reported on 01/10/2023   [provider]  Active     Discontinued 05/27/11 1120 (Discontinued by provider)   dapagliflozin propanediol (  FARXIGA) 10 MG TABS tablet [id number] No Take 1 tablet (10 mg total) by mouth daily before breakfast.  Patient not taking: Reported on 11/26/2022   Etta Grandchild, MD Not Taking Active   Evolocumab [facility] SURECLICK) 140 MG/ML Ivory Broad [id number] Yes Inject 140 mg into the skin every 14 (fourteen) days. Kathleene Hazel, MD Taking Active   levothyroxine (SYNTHROID) 125 MCG tablet  [id number]  Take 125 mcg by mouth daily. [provider]  Active   metoprolol succinate (TOPROL-XL) 50 MG 24 hr tablet [id number]  TAKE 1 TABLET (50 MG TOTAL) BY MOUTH DAILY. TAKE WITH OR IMMEDIATELY FOLLOWING A MEAL. Kathleene Hazel, MD  Active   nitroGLYCERIN (NITROSTAT) 0.4 MG SL tablet [id number]  Place 1 tablet (0.4 mg total) under the tongue every 5 (five) minutes as needed for chest pain. May take up to a total of 3 doses, if chest pain is not relieved after 2nd dose call 911 then take 3rd dose. Reather Littler D, NP  Active   olmesartan (BENICAR) 20 MG tablet [id number] Yes Take 1 tablet (20 mg total) by mouth daily. Etta Grandchild, MD Taking Active     Discontinued 05/27/11 1121 (Discontinued by provider)   promethazine-dextromethorphan (PROMETHAZINE-DM) 6.25-15 MG/5ML syrup [id number]  Take 5 mLs by mouth every 8 (eight) hours as needed for cough. Landis Martins, PA-C  Active   rosuvastatin (CRESTOR) 10 MG tablet [id number]  TAKE 1 TABLET THREE TIMES WEEKLY Etta Grandchild, MD  Active   Med List Note Scarlett Presto, [facility] 04/01/12 1637): l              Assessment/Plan:   Diabetes: - Currently controlled, A1c goal <7% - Reviewed long term cardiovascular and renal outcomes of uncontrolled blood sugar - Reviewed dietary modifications extensively including focusing on balanced meals/snacks, carb portion sizes, increasing fiber and protein - Pt has samples at the front desk waiting for him - he will come today to pick up. He plans to get through insurance in the new year when cost is expected to be lower. - Does not meet PAP requirements due to income  HTN: -Currently controlled, BP goal <130/80 - Recommended checking BP at home regularly and keep a log - Consider d/c amlodipine if experiences hypotension sx - Advised pt to call if he begins experiencing low BP symptoms  CKD: - Currently controlled. MACR <30 and GFR stable. - Since pt just started olmesartan, would  hold off on [place] for now if cost is high. Prefer to get Marcelline Deist back on board as it will help with BG and cardiorenal protection   Follow Up Plan:  1/13   Arbutus Leas, PharmD, BCPS Clinical Pharmacist [facility] [phone]

## 2023-03-02 ENCOUNTER — Other Ambulatory Visit: Payer: Self-pay | Admitting: Internal Medicine

## 2023-03-02 DIAGNOSIS — E785 Hyperlipidemia, unspecified: Secondary | ICD-10-CM

## 2023-03-18 ENCOUNTER — Encounter: Payer: Medicare Other | Attending: Family Medicine | Admitting: Dietician

## 2023-03-18 DIAGNOSIS — E1122 Type 2 diabetes mellitus with diabetic chronic kidney disease: Secondary | ICD-10-CM | POA: Diagnosis not present

## 2023-03-18 DIAGNOSIS — I2510 Atherosclerotic heart disease of native coronary artery without angina pectoris: Secondary | ICD-10-CM | POA: Insufficient documentation

## 2023-03-18 DIAGNOSIS — N1831 Chronic kidney disease, stage 3a: Secondary | ICD-10-CM | POA: Insufficient documentation

## 2023-03-18 NOTE — Patient Instructions (Signed)
 1- Plate Planner for balanced meals

## 2023-03-18 NOTE — Progress Notes (Signed)
 Diabetes Self-Management Education  Visit Type: First/Initial  Appt. Start Time: 1405 Appt. End Time: 1605  03/18/2023  Mr. Devon Brady, identified by name and date of birth, is a 73 y.o. male with a diagnosis of Diabetes: Type 2.   ASSESSMENT  Patient is here today alone. Pt states he is retired. Patient would like to learn more about what to eat for blood sugar management.  Patient lives with wife and Pt reports does the majority of the shopping and cooking. Pt reports making the following changes including decreasing carbohydrate intake. Pt denies SGLT2 administration and c/o out of pocket cost and concerns. Pt reports a barrier of weather with participation of physical activity. Pt reports following a NAS meal pattern. Pt reports eating out stating 1-2 weekly or less. Pt denies recent self monitoring of blood sugar and would like to check with his insurance about coverage. Pt reports he is lactose intolerant can tolerate yogurt and cheese and selects a plant based milk options. All Patient questions were answered during I this encounter.     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   s/p total thyroidectomy   Renal insufficiency    Sleep apnea    No CPAP   TIA (transient ischemic attack) 2011    Labs noted:   Lab Results  Component Value Date   HGBA1C 6.8 (H) 01/10/2023   Lab Results  Component Value Date   CHOL 131 01/10/2023   HDL 51.20 01/10/2023   LDLCALC 65 01/10/2023   TRIG 73.0 01/10/2023   CHOLHDL 3 01/10/2023   Lab Results  Component Value  Date   NA 140 01/10/2023   CL 105 01/10/2023   K 4.3 01/10/2023   CO2 29 01/10/2023   BUN 18 01/10/2023   CREATININE 1.46 01/10/2023   GFR 48.18 (L) 01/10/2023   CALCIUM  9.5 01/10/2023   ALBUMIN 4.0 01/10/2023   GLUCOSE 116 (H) 01/10/2023    Medications include:   Current Outpatient Medications:    amLODipine  (NORVASC ) 5 MG tablet, TAKE 1 TABLET EVERY DAY, Disp: 90 tablet, Rfl: 3   aspirin  EC 81 MG tablet, Take 81 mg by mouth every evening. , Disp: , Rfl:    Evolocumab  (REPATHA  SURECLICK) 140 MG/ML SOAJ, Inject 140 mg into the skin every 14 (fourteen) days., Disp: 6 mL, Rfl: 3   levothyroxine  (SYNTHROID ) 125 MCG tablet, Take 125 mcg by mouth daily., Disp: , Rfl:    metoprolol  succinate (TOPROL -XL) 50 MG 24 hr tablet, TAKE 1 TABLET (50 MG TOTAL) BY MOUTH DAILY. TAKE WITH OR IMMEDIATELY FOLLOWING A MEAL., Disp: 90 tablet, Rfl: 3   nitroGLYCERIN  (NITROSTAT ) 0.4 MG SL tablet, Place 1 tablet (0.4 mg total) under the tongue every 5 (five) minutes as needed for chest pain. May take up to a total of 3 doses, if chest pain is not relieved after 2nd dose call 911 then take 3rd dose., Disp: 25 tablet, Rfl: 6   olmesartan  (BENICAR ) 20 MG tablet,  Take 1 tablet (20 mg total) by mouth daily., Disp: 90 tablet, Rfl: 0   promethazine -dextromethorphan (PROMETHAZINE -DM) 6.25-15 MG/5ML syrup, Take 5 mLs by mouth every 8 (eight) hours as needed for cough., Disp: 118 mL, Rfl: 0   rosuvastatin  (CRESTOR ) 10 MG tablet, TAKE 1 TABLET THREE TIMES WEEKLY, Disp: 36 tablet, Rfl: 1   Blood Glucose Monitoring Suppl (TRUE METRIX AIR GLUCOSE METER) w/Device KIT, , Disp: , Rfl:    dapagliflozin  propanediol (FARXIGA ) 10 MG TABS tablet, Take 1 tablet (10 mg total) by mouth daily before breakfast. (Patient not taking: Reported on 03/18/2023), Disp: 90 tablet, Rfl: 1   There were no vitals taken for this visit. There is no height or weight on file to calculate BMI.  BMI Readings from Last 3 Encounters:  01/10/23 34.32 kg/m   11/26/22 33.43 kg/m  07/12/22 34.23 kg/m      Diabetes Self-Management Education - 03/18/23 1412       Visit Information   Visit Type First/Initial      Initial Visit   Diabetes Type Type 2    Date Diagnosed 2022    Are you taking your medications as prescribed? Not on Medications      Health Coping   How would you rate your overall health? Fair      Psychosocial Assessment   Patient Belief/Attitude about Diabetes Motivated to manage diabetes    What is the hardest part about your diabetes right now, causing you the most concern, or is the most worrisome to you about your diabetes?   Making healty food and beverage choices;Checking blood sugar;Being active    Self-care barriers None    Self-management support Doctor's office    Other persons present Patient    Patient Concerns Nutrition/Meal planning;Monitoring;Healthy Lifestyle    Special Needs None    Preferred Learning Style Visual    Learning Readiness Change in progress    How often do you need to have someone help you when you read instructions, pamphlets, or other written materials from your doctor or pharmacy? 1 - Never    What is the last grade level you completed in school? college      Pre-Education Assessment   Patient understands the diabetes disease and treatment process. Needs Instruction    Patient understands incorporating nutritional management into lifestyle. Needs Instruction    Patient undertands incorporating physical activity into lifestyle. Needs Instruction    Patient understands using medications safely. Needs Instruction    Patient understands monitoring blood glucose, interpreting and using results Needs Instruction    Patient understands prevention, detection, and treatment of acute complications. Needs Instruction    Patient understands prevention, detection, and treatment of chronic complications. Needs Instruction    Patient understands how to develop strategies to address psychosocial  issues. Needs Instruction    Patient understands how to develop strategies to promote health/change behavior. Needs Instruction      Complications   Last HgB A1C per patient/outside source 6.8 %    How often do you check your blood sugar? 0 times/day (not testing)    Have you had a dilated eye exam in the past 12 months? Yes    Have you had a dental exam in the past 12 months? Yes    Are you checking your feet? Yes    How many days per week are you checking your feet? 7      Dietary Intake   Breakfast ~8-11am: eggs, grits, turkey bacon or cheerios or totoal or  raisin bran, unsweetned almond milk or coconut milk    Snack (afternoon) ~ 3 pm: skips or yogurt or 6 whole grain saltines with peanut butter or leftover    Dinner ~ 7pm: chicken soup with carrots, peas with collards and blacke eye peas, bite of corn bread    Snack (evening) 6 whole grain saltines with peanut butter    Beverage(s) water , seltzer water , coffee with flavored creamer, diet dr pepper, orange juice, unsweetened tea      Activity / Exercise   Activity / Exercise Type Light (walking / raking leaves)    How many days per week do you exercise? 3    How many minutes per day do you exercise? 30    Total minutes per week of exercise 90      Patient Education   Previous Diabetes Education No    Disease Pathophysiology Definition of diabetes, type 1 and 2, and the diagnosis of diabetes;Explored patient's options for treatment of their diabetes    Healthy Eating Plate Method    Being Active Helped patient identify appropriate exercises in relation to his/her diabetes, diabetes complications and other health issue.;Role of exercise on diabetes management, blood pressure control and cardiac health.    Medications Reviewed patients medication for diabetes, action, purpose, timing of dose and side effects.    Monitoring Identified appropriate SMBG and/or A1C goals.    Acute complications Discussed and identified patients'  prevention, symptoms, and treatment of hyperglycemia.    Chronic complications Dental care;Retinopathy and reason for yearly dilated eye exams    Diabetes Stress and Support Role of stress on diabetes;Worked with patient to identify barriers to care and solutions      Individualized Goals (developed by patient)   Nutrition Follow meal plan discussed    Physical Activity Exercise 3-5 times per week    Medications take my medication as prescribed    Monitoring  Test my blood glucose as discussed    Problem Solving Eating Pattern;Addressing barriers to behavior change    Reducing Risk do foot checks daily    Health Coping Discuss barriers to diabetes care with support person/system (comment specifics as needed)      Post-Education Assessment   Patient understands the diabetes disease and treatment process. Needs Review    Patient understands incorporating nutritional management into lifestyle. Needs Review    Patient undertands incorporating physical activity into lifestyle. Needs Review    Patient understands using medications safely. Needs Review    Patient understands monitoring blood glucose, interpreting and using results Needs Review    Patient understands prevention, detection, and treatment of acute complications. Needs Review    Patient understands prevention, detection, and treatment of chronic complications. Needs Review    Patient understands how to develop strategies to address psychosocial issues. Needs Review    Patient understands how to develop strategies to promote health/change behavior. Needs Review      Outcomes   Expected Outcomes Demonstrated interest in learning. Expect positive outcomes    Future DMSE 3-4 months    Program Status Not Completed             Individualized Plan for Diabetes Self-Management Training:   Learning Objective:  Patient will have a greater understanding of diabetes self-management. Patient education plan is to attend individual  and/or group sessions per assessed needs and concerns.   Plan:   Patient Instructions  1- Plate Planner for balanced meals   Expected Outcomes:  Demonstrated interest in learning. Expect positive  outcomes  Education material provided: ADA - How to Thrive: A Guide for Your Journey with Diabetes, My Plate, Snack sheet, and Diabetes Resources  If problems or questions, patient to contact team via:  Phone  Future DSME appointment: 3-4 months

## 2023-03-21 ENCOUNTER — Ambulatory Visit (HOSPITAL_COMMUNITY)
Admission: EM | Admit: 2023-03-21 | Discharge: 2023-03-21 | Disposition: A | Discharge status: Home / Self Care | Payer: 59 | Attending: Family Medicine | Admitting: Family Medicine

## 2023-03-21 ENCOUNTER — Encounter (HOSPITAL_COMMUNITY): Payer: Self-pay | Admitting: Emergency Medicine

## 2023-03-21 ENCOUNTER — Other Ambulatory Visit: Payer: Self-pay

## 2023-03-21 DIAGNOSIS — J069 Acute upper respiratory infection, unspecified: Secondary | ICD-10-CM | POA: Insufficient documentation

## 2023-03-21 MED ORDER — BENZONATATE 100 MG PO CAPS: 100.0000 mg | ORAL_CAPSULE | Freq: Three times a day (TID) | ORAL | 0 refills | Status: DC | PRN

## 2023-03-21 NOTE — ED Triage Notes (Signed)
 Symptoms started Friday, 03/18/2023.  Reports cough, nasal drainage and sinus pressure, and headache.   Has had dayquil and nyquil

## 2023-03-21 NOTE — Discharge Instructions (Signed)
 Take benzonatate  100 mg, 1 tab every 8 hours as needed for cough.   You have been swabbed for COVID, and the test will result in the next 24 hours. Our staff will call you if positive. If the COVID test is positive, you should quarantine until you are fever free for 24 hours and you are starting to feel better, and then take added precautions for the next 5 days, such as physical distancing/wearing a mask and good hand hygiene/washing.  A humidifier can help some at night for your symptoms and also using Vicks vapor rub can help.

## 2023-03-21 NOTE — ED Provider Notes (Signed)
 [facility]    CSN: [id number] Arrival date & time: 03/21/23  1046      History   Chief Complaint Chief Complaint  Patient presents with   Cough    HPI Devon Brady is a 73 y.o. male.    Cough Here for cough, nasal congestion and postnasal drainage.  Symptoms began on January 3.  Is also had some sinus pressure and headache.  No vomiting or nausea or diarrhea.  No shortness of breath.  His cough and congestion do seem worse at night.  No history of asthma He is allergic to Crestor    Past medical history includes coronary artery disease, diabetes mellitus, and chronic kidney disease.  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   s/p total thyroidectomy   Renal insufficiency    Sleep apnea    No CPAP   TIA (transient ischemic attack) 2011    Patient Active Problem List   Diagnosis Date Noted   Type 2 diabetes mellitus with stage 3a chronic kidney disease, without long-term current use of insulin (HCC) 01/10/2023   Encounter for general adult medical examination with abnormal findings 02/11/2020   Essential hypertension 01/31/2018   Microalbuminuria due to type 2 diabetes mellitus (HCC) 01/31/2018   Conductive hearing loss, bilateral 01/30/2018   BPH (benign prostatic hyperplasia) 12/31/2014   Obesity (BMI 35.0-39.9 without comorbidity) 10/04/2013   Hyperlipidemia with target LDL less than 100 05/08/2013   Hypothyroidism, 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   Type II diabetes mellitus with manifestations (HCC) 11/22/2012   History of Hodgkin's lymphoma    Primary  thyroid  papillary carcinoma (HCC)    Chronic renal disease, stage 3, moderately decreased glomerular filtration rate (GFR) between 30-59 mL/min/1.73 square meter    CAD (coronary artery disease) 10/13/2010   Erectile dysfunction 10/13/2010    Past Surgical History:  Procedure Laterality Date   BONE MARROW ASPIRATE,BIOPSY, AND CLOT  06/11/11   LEFT ILIAC CREAST   BUNIONECTOMY Right 1978   foot   CARDIAC CATHETERIZATION  10/15/2009   COLONOSCOPY     CORONARY ARTERY BYPASS GRAFT  10/17/2009   LIMA to LAD,SVG to Ramus,SVG to OM sequential to OM2, SVG to marginal of RCA   ESOPHAGOGASTRODUODENOSCOPY     LEFT HEART CATHETERIZATION WITH CORONARY/GRAFT ANGIOGRAM N/A 10/03/2013   Procedure: LEFT HEART CATHETERIZATION WITH EL BILE;  Surgeon: Victory LELON Claudene DOUGLAS, MD;  Location: [facility] CATH LAB;  Service: Cardiovascular;  Laterality: N/A;   LYMPH NODE BIOPSY  06/07/11   RIGHT INGUINAL NODE: CLASSICAL HODGKIN'S LYMPHOMA, NODULAR SCLEROSIS TYPE   PORT-A-CATH REMOVAL Left 12/06/2013   Procedure: REMOVAL PORT-A-CATH;  Surgeon: Jina Nephew, MD;  Location: [facility];  Service: General;  Laterality: Left;   PORTACATH PLACEMENT  06/10/2011   Procedure: INSERTION PORT-A-CATH;  Surgeon: Jina Nephew, MD;  Location: [facility];  Service: General;  Laterality: Left;  subclavian    THYROIDECTOMY  03/31/2012   Procedure: THYROIDECTOMY;  Surgeon: Ida Loader, MD;  Location: [facility];  Service:  ENT;  Laterality: N/A;   TONSILLECTOMY     as a child   VASECTOMY  1980       Home Medications    Prior to Admission medications   Medication Sig Start Date End Date Taking? Authorizing Provider  benzonatate  (TESSALON ) 100 MG capsule Take 1 capsule (100 mg total) by mouth 3 (three) times daily as needed for cough. 03/21/23  Yes Vonna Sharlet POUR, MD  amLODipine  (NORVASC ) 5 MG tablet TAKE 1 TABLET EVERY DAY 12/22/22   Verlin Lonni BIRCH, MD  aspirin  EC 81 MG tablet Take 81 mg by mouth every evening.      [provider]  Blood Glucose Monitoring Suppl (TRUE METRIX AIR GLUCOSE METER) w/Device KIT  08/24/22   [provider]  dapagliflozin  propanediol (FARXIGA ) 10 MG TABS tablet Take 1 tablet (10 mg total) by mouth daily before breakfast. Patient not taking: Reported on 03/18/2023 07/12/22   Joshua Debby CROME, MD  Evolocumab  (REPATHA  SURECLICK) 140 MG/ML SOAJ Inject 140 mg into the skin every 14 (fourteen) days. 01/12/23   Verlin Lonni BIRCH, MD  levothyroxine  (SYNTHROID ) 125 MCG tablet Take 125 mcg by mouth daily.    [provider]  metoprolol  succinate (TOPROL -XL) 50 MG 24 hr tablet TAKE 1 TABLET (50 MG TOTAL) BY MOUTH DAILY. TAKE WITH OR IMMEDIATELY FOLLOWING A MEAL. 12/22/22   Verlin Lonni BIRCH, MD  nitroGLYCERIN  (NITROSTAT ) 0.4 MG SL tablet Place 1 tablet (0.4 mg total) under the tongue every 5 (five) minutes as needed for chest pain. May take up to a total of 3 doses, if chest pain is not relieved after 2nd dose call 911 then take 3rd dose. 11/26/22   West, Katlyn D, NP  olmesartan  (BENICAR ) 20 MG tablet Take 1 tablet (20 mg total) by mouth daily. 01/11/23   Joshua Debby CROME, MD  rosuvastatin  (CRESTOR ) 10 MG tablet TAKE 1 TABLET THREE TIMES WEEKLY 03/02/23   Joshua Debby CROME, MD  citalopram  (CELEXA ) 20 MG tablet Take 1 tablet (20 mg total) by mouth daily. 10/13/10 05/27/11  Verlin Lonni BIRCH, MD  omeprazole  (PRILOSEC  OTC) 20 MG tablet Take 1 tablet (20 mg total) by mouth daily. 11/02/10 05/27/11  Antonio Cyndee Jamee JONELLE, DO    Family History Family History  Problem Relation Age of Onset   Arthritis Mother    Hypertension Mother    Diabetes Father    Coronary artery disease Father 32       Died age 63   Kidney disease Father    Hypertension Sister    Hypertension Sister        2nd Sister   Alcohol abuse Brother    Prostate cancer Paternal Uncle        Great   Cancer Neg Hx    Drug abuse Neg Hx    Early death Neg Hx    Stroke Neg Hx     Social  History Social History   Tobacco Use   Smoking status: Never   Smokeless tobacco: Never  Vaping Use   Vaping status: Never Used  Substance Use Topics   Alcohol use: Yes    Comment: 10/02/2013 couple beers; couple times/month   Drug use: No     Allergies   Crestor  [rosuvastatin ]   Review of Systems Review of Systems  Respiratory:  Positive for cough.      Physical Exam Triage Vital Signs ED Triage Vitals  Encounter Vitals Group     BP 03/21/23 1232 109/74  Systolic BP Percentile --      Diastolic BP Percentile --      Pulse Rate 03/21/23 1232 68     Resp 03/21/23 1232 20     Temp 03/21/23 1232 98 F (36.7 C)     Temp Source 03/21/23 1232 Oral     SpO2 03/21/23 1232 96 %     Weight --      Height --      Head Circumference --      Peak Flow --      Pain Score 03/21/23 1229 2     Pain Loc --      Pain Education --      Exclude from Growth Chart --    No data found.  Updated Vital Signs BP 109/74 (BP Location: Right Arm) Comment (BP Location): large cuff  Pulse 68   Temp 98 F (36.7 C) (Oral)   Resp 20   SpO2 96%   Visual Acuity Right Eye Distance:   Left Eye Distance:   Bilateral Distance:    Right Eye Near:   Left Eye Near:    Bilateral Near:     Physical Exam Vitals reviewed.  Constitutional:      General: He is not in acute distress.    Appearance: He is not ill-appearing, toxic-appearing or diaphoretic.  HENT:     Right Ear: Ear canal normal.     Left Ear: Ear canal normal.     Ears:     Comments: Bilaterally tympanic membranes are gray and dull.    Nose: Nose normal.     Mouth/Throat:     Mouth: Mucous membranes are moist.     Pharynx: No oropharyngeal exudate or posterior oropharyngeal erythema.  Eyes:     Extraocular Movements: Extraocular movements intact.     Conjunctiva/sclera: Conjunctivae normal.     Pupils: Pupils are equal, round, and reactive to light.  Cardiovascular:     Rate and Rhythm: Normal rate and regular  rhythm.     Heart sounds: No murmur heard. Pulmonary:     Effort: No respiratory distress.     Breath sounds: No stridor. No wheezing, rhonchi or rales.  Musculoskeletal:     Cervical back: Neck supple.  Lymphadenopathy:     Cervical: No cervical adenopathy.  Skin:    Coloration: Skin is not jaundiced or pale.  Neurological:     General: No focal deficit present.     Mental Status: He is alert and oriented to person, place, and time.  Psychiatric:        Behavior: Behavior normal.      UC Treatments / Results  Labs (all labs ordered are listed, but only abnormal results are displayed) Labs Reviewed  SARS CORONAVIRUS 2 (TAT 6-24 HRS)    EKG   Radiology No results found.  Procedures Procedures (including critical care time)  Medications Ordered in UC Medications - No data to display  Initial Impression / Assessment and Plan / UC Course  I have reviewed the triage vital signs and the nursing notes.  Pertinent labs & imaging results that were available during my care of the patient were reviewed by me and considered in my medical decision making (see chart for details).     COVID swab is done and we will notify if positive, and he would be a candidate for Paxlovid and renal dosing.  His last EGFR was 48.  With no fever and being at 72 hours of illness, I  do not think he needs a flu swab  Tessalon  Perles are sent in for the cough Final Clinical Impressions(s) / UC Diagnoses   Final diagnoses:  Viral URI with cough     Discharge Instructions      Take benzonatate  100 mg, 1 tab every 8 hours as needed for cough.   You have been swabbed for COVID, and the test will result in the next 24 hours. Our staff will call you if positive. If the COVID test is positive, you should quarantine until you are fever free for 24 hours and you are starting to feel better, and then take added precautions for the next 5 days, such as physical distancing/wearing a mask and good  hand hygiene/washing.  A humidifier can help some at night for your symptoms and also using Vicks vapor rub can help.     ED Prescriptions     Medication Sig Dispense Auth. Provider   benzonatate  (TESSALON ) 100 MG capsule Take 1 capsule (100 mg total) by mouth 3 (three) times daily as needed for cough. 21 capsule Malone Vanblarcom K, MD      PDMP not reviewed this encounter.   Vonna Sharlet POUR, MD 03/21/23 1259

## 2023-03-22 LAB — SARS CORONAVIRUS 2 (TAT 6-24 HRS): SARS Coronavirus 2: NEGATIVE

## 2023-03-23 ENCOUNTER — Other Ambulatory Visit: Payer: Self-pay | Admitting: Internal Medicine

## 2023-03-23 DIAGNOSIS — I10 Essential (primary) hypertension: Secondary | ICD-10-CM

## 2023-03-23 DIAGNOSIS — R809 Proteinuria, unspecified: Secondary | ICD-10-CM

## 2023-03-23 DIAGNOSIS — N1831 Chronic kidney disease, stage 3a: Secondary | ICD-10-CM

## 2023-03-23 DIAGNOSIS — I2510 Atherosclerotic heart disease of native coronary artery without angina pectoris: Secondary | ICD-10-CM

## 2023-03-24 ENCOUNTER — Encounter: Payer: Self-pay | Admitting: Cardiovascular Disease

## 2023-03-28 ENCOUNTER — Other Ambulatory Visit (INDEPENDENT_AMBULATORY_CARE_PROVIDER_SITE_OTHER): Payer: Medicare Other | Admitting: Pharmacist

## 2023-03-28 VITALS — BP 94/69

## 2023-03-28 DIAGNOSIS — N1832 Chronic kidney disease, stage 3b: Secondary | ICD-10-CM

## 2023-03-28 DIAGNOSIS — E118 Type 2 diabetes mellitus with unspecified complications: Secondary | ICD-10-CM

## 2023-03-28 DIAGNOSIS — I10 Essential (primary) hypertension: Secondary | ICD-10-CM

## 2023-03-28 MED ORDER — DAPAGLIFLOZIN PROPANEDIOL 10 MG PO TABS: 10.0000 mg | ORAL_TABLET | Freq: Every day | ORAL | 0 refills | Status: DC

## 2023-03-28 NOTE — Progress Notes (Signed)
 03/28/2023 Name: DELRON COMER MRN: [id number] DOB: January 14, 1951  Chief Complaint  Patient presents with   Medication Management    Devon Brady is a 73 y.o. year old male who presented for a telephone visit.   They were referred to the pharmacist by their PCP for assistance in managing diabetes and CKD .    Subjective:  Care Team: Primary Care Provider: Joshua Debby CROME, MD ; Next Scheduled Visit: none scheduled Dietitian consult 03/18/2023  Medication Access/Adherence  Current Pharmacy:  [facility] - [place], [place] - [address] [place] [address] Phone: [phone] Fax: [phone]  [id number] [place], [place] - [address] AT [facility] [address] [place] [address] Phone: [phone] Fax: [phone]  [id number] [place], [place] - [address] AT [facility] [address] [place] [address] Phone: [phone] Fax: [phone]  [facility] [place], [place] - [address] [facility] AT Portal to Registered Caremark Sites One [place] [address] Phone: [phone] Fax: [phone]   Patient reports affordability concerns with their medications: Yes  Patient reports access/transportation concerns to their pharmacy: No  Patient reports adherence concerns with their medications:  Yes     Diabetes:  Current medications: Farxiga  10 mg daily (taking samples) Medications tried in the past:  *Pt notes Farxiga  requires PA for his new insurance  Current glucose readings: has a glucometer but has not been checking  Current meal patterns:  He has done a lot of research on diet. Trying to follow Mediterranean diet. Has Increased veggies, has baked chicken and fish, cut out bread For dessert has been having ricotta with slices fruit, cottage cheese. Using Stevia and monk  fruit as sweeteners  Current physical activity: Walks the dog, 1 mile daily  HTN: Current medications: amlodipine  5 mg daily, olmesartan  20 mg daily (started 10/29), metoprolol  succinate 50 mg daily  Home BP: BP 119/75, BP during phone call: 92/67, 94/69  Reports a fall last week, had dizziness after (this was the first and only time this happened per patient)   CKD: Current medications: olmesartan  20 mg daily, Farxiga  10 mg daily (not taking)  Objective: BP Readings from Last 3 Encounters:  03/28/23 94/69  03/21/23 109/74  02/07/23 105/72     Lab Results  Component Value Date   HGBA1C 6.8 (H) 01/10/2023    Lab Results  Component Value Date   CREATININE 1.46 01/10/2023   BUN 18 01/10/2023   NA 140 01/10/2023   K 4.3 01/10/2023   CL 105 01/10/2023   CO2 29 01/10/2023    Lab Results  Component Value Date   CHOL 131 01/10/2023   HDL 51.20 01/10/2023   LDLCALC 65 01/10/2023   TRIG 73.0 01/10/2023   CHOLHDL 3 01/10/2023    Medications Reviewed Today     Reviewed by Merceda Lela SAUNDERS, RPH (Pharmacist) on 03/28/23 at 1550  Med List Status: <None>   Medication Order Taking? Sig Documenting Provider Last Dose Status Informant  amLODipine  (NORVASC ) 5 MG tablet [id number] Yes TAKE 1 TABLET EVERY DAY Verlin Lonni BIRCH, MD Taking Active   aspirin  EC 81 MG tablet [id number]  Take 81 mg by mouth every evening.  [provider]  Active Self  benzonatate  (TESSALON ) 100 MG capsule [id number]  Take 1 capsule (100 mg total) by mouth 3 (three) times daily as needed for  cough. Vonna Sharlet POUR, MD  Active   Blood Glucose Monitoring Suppl (TRUE METRIX AIR GLUCOSE METER) w/Device PRESSLEY [id number]    Patient not taking: Reported on 03/18/2023   [provider]  Active     Discontinued 05/27/11 1120 (Discontinued by provider)   dapagliflozin  propanediol (FARXIGA ) 10 MG TABS tablet [id number] Yes Take 1 tablet (10 mg total) by mouth daily before breakfast. Joshua Debby CROME, MD Taking Active   Evolocumab  (REPATHA  SURECLICK) 140 MG/ML EMMANUEL [id number] Yes Inject 140 mg into the skin every 14 (fourteen) days. Verlin Lonni BIRCH, MD Taking Active   levothyroxine  (SYNTHROID ) 125 MCG tablet [id number]  Take 125 mcg by mouth daily. [provider]  Active   metoprolol  succinate (TOPROL -XL) 50 MG 24 hr tablet [id number]  TAKE 1 TABLET (50 MG TOTAL) BY MOUTH DAILY. TAKE WITH OR IMMEDIATELY FOLLOWING A MEAL. Verlin Lonni BIRCH, MD  Active   nitroGLYCERIN  (NITROSTAT ) 0.4 MG SL tablet [id number] Yes Place 1 tablet (0.4 mg total) under the tongue every 5 (five) minutes as needed for chest pain. May take up to a total of 3 doses, if chest pain is not relieved after 2nd dose call 911 then take 3rd dose. West, Katlyn D, NP Taking Active   olmesartan  (BENICAR ) 20 MG tablet [id number] Yes TAKE 1 TABLET DAILY Joshua Debby CROME, MD Taking Active     Discontinued 05/27/11 1121 (Discontinued by provider)   rosuvastatin  (CRESTOR ) 10 MG tablet [id number] Yes TAKE 1 TABLET THREE TIMES WEEKLY Joshua Debby CROME, MD Taking Active   Med List Note Lige Zebedee BIRCH, [facility] 04/01/12 1637): l              Assessment/Plan:   Diabetes: - Currently controlled, A1c goal <7% - Reviewed long term cardiovascular and renal outcomes of uncontrolled blood sugar - Reviewed dietary modifications extensively including focusing on balanced meals/snacks, carb portion sizes, increasing fiber and protein - Farxiga  does not appear to have been sent to pt's new pharmacy yet. [facility]. Shouldn't require PA. - Does not meet PAP requirements due to income  HTN: -Currently over controlled, BP goal <130/80 - Recommended checking BP at home regularly and keep a log - Consider d/c amlodipine  if experiences hypotension sx - Advised pt to call if he begins experiencing low BP symptoms - Calling patient back at the end of the week to see if BP continues being low and/or if he has  hypotension sx  CKD: - Currently controlled. MACR <30 and GFR stable. - Since pt just started olmesartan , would hold off on Kerendia  for now if cost is high. Prefer to get Farxiga  back on board as it will help with BG and cardiorenal protection   Follow Up Plan:  1/17  Darrelyn Drum, PharmD, BCPS Clinical Pharmacist [place] [facility] at [facility] [phone]

## 2023-03-28 NOTE — Patient Instructions (Signed)
 It was a pleasure speaking with you today!  Continue monitoring BP and keep a log. I will call back on 1/17 to review readings.  Feel free to call with any questions or concerns!  Darrelyn Drum, PharmD, BCPS, CPP Clinical Pharmacist Practitioner [place] [facility] at [facility] [phone]

## 2023-03-30 ENCOUNTER — Other Ambulatory Visit (HOSPITAL_COMMUNITY): Payer: Self-pay

## 2023-03-30 ENCOUNTER — Telehealth: Payer: Self-pay | Admitting: Pharmacy Technician

## 2023-03-30 NOTE — Telephone Encounter (Signed)
 Pharmacy Patient Advocate Encounter   Received notification from Patient Advice Request messages that prior authorization for repatha  is required/requested.   Insurance verification completed.   The patient is insured through [facility] .   Per test claim: PA required; PA submitted to above mentioned insurance via CoverMyMeds Key/confirmation #/EOC [id number] Status is pending

## 2023-03-31 ENCOUNTER — Other Ambulatory Visit (HOSPITAL_COMMUNITY): Payer: Self-pay

## 2023-03-31 NOTE — Telephone Encounter (Signed)
Pharmacy Patient Advocate Encounter  Received notification from [facility] that Prior Authorization for repatha has been APPROVED from 03/30/23 to 09/27/23. Ran test claim, Copay is $396.00 3 months (DEDUCTIBLE). This test claim was processed through [facility]- copay amounts may vary at other pharmacies due to pharmacy/plan contracts, or as the patient moves through the different stages of their insurance plan.   PA #/Case ID/Reference #: [id number]

## 2023-04-01 ENCOUNTER — Other Ambulatory Visit (INDEPENDENT_AMBULATORY_CARE_PROVIDER_SITE_OTHER): Payer: Medicare Other | Admitting: Pharmacist

## 2023-04-01 ENCOUNTER — Telehealth: Payer: Self-pay | Admitting: Internal Medicine

## 2023-04-01 VITALS — BP 120/80

## 2023-04-01 DIAGNOSIS — I10 Essential (primary) hypertension: Secondary | ICD-10-CM

## 2023-04-01 NOTE — Telephone Encounter (Signed)
Copied from CRM [id number]. Topic: Appointments - Appointment Cancel/Reschedule >> Apr 01, 2023 12:53 PM Deaijah H wrote: Patient/patient representative is calling to cancel or reschedule an appointment. Refer to attachments for appointment information. // Patient called due to not hearing from someone for 12:30 appointment / reached out to CAL to advise no call , was advised person for phone call is not in office, tried advising patient but no response / please call [phone]

## 2023-04-01 NOTE — Progress Notes (Signed)
04/01/2023 Name: AYUUB SCOMA MRN: [id number] DOB: 1950/09/14  Chief Complaint  Patient presents with   Hypertension   Medication Management    Devon Brady is a 73 y.o. year old male who presented for a telephone visit.   They were referred to the pharmacist by their PCP for assistance in managing diabetes and CKD .   Subjective:  Care Team: Primary Care Provider: Etta Grandchild, MD ; Next Scheduled Visit: none scheduled Dietitian consult 03/18/2023  Medication Access/Adherence  Current Pharmacy:  [facility] - [place], [place] - [address] [place] [address] Phone: [phone] Fax: [phone]  [id number] [place], [place] - [address] [facility] [address] [facility] [address] [place] [address] Phone: [phone] Fax: [phone]  [id number] [place], [place] - [address] AT [facility] [address] [place] [address] Phone: [phone] Fax: [phone]  [facility] [place], [place] - [address] [facility] AT Portal to Registered Caremark Sites One [place] [address] Phone: [phone] Fax: [phone]   Patient reports affordability concerns with their medications: Yes  Patient reports access/transportation concerns to their pharmacy: No  Patient reports adherence concerns with their medications:  Yes     Diabetes:  Current medications: Farxiga 10 mg daily  Medications tried in the past:   Current glucose readings: has a glucometer but has not been checking  Current meal patterns:  He has done a lot of research on diet. Trying to follow Mediterranean diet. Has Increased veggies, has baked chicken and fish, cut out bread For dessert has been having ricotta with slices fruit, cottage cheese. Using Stevia and monk fruit as sweeteners  Current physical activity: Walks  the dog, 1 mile daily  HTN: Current medications: amlodipine 5 mg daily, olmesartan 20 mg daily (started 10/29), metoprolol succinate 50 mg daily  Home BP:  1/14 104/61 121/78 117/82 AM after meds 117/80 before bed  1/15  99/71 lying down AM 111/76 after getting up 102/70 later AM  1/16  119/71 lying down AM 127/79 sitting 116/80 after meds 128/85 PM  1/17  135/79  114/78 sitting 120/80 after meds  Reports a fall last week, had dizziness after (this was the first and only time this happened per patient)   CKD: Current medications: olmesartan 20 mg daily, Farxiga 10 mg daily (not taking)  Objective: BP Readings from Last 3 Encounters:  04/01/23 120/80  03/28/23 94/69  03/21/23 109/74     Lab Results  Component Value Date   HGBA1C 6.8 (H) 01/10/2023    Lab Results  Component Value Date   CREATININE 1.46 01/10/2023   BUN 18 01/10/2023   NA 140 01/10/2023   K 4.3 01/10/2023   CL 105 01/10/2023   CO2 29 01/10/2023    Lab Results  Component Value Date   CHOL 131 01/10/2023   HDL 51.20 01/10/2023   LDLCALC 65 01/10/2023   TRIG 73.0 01/10/2023   CHOLHDL 3 01/10/2023    Medications Reviewed Today     Reviewed by Bonita Quin, RPH (Pharmacist) on 04/01/23 at 1633  Med List Status: <None>   Medication Order Taking? Sig Documenting Provider Last Dose Status Informant  amLODipine (NORVASC) 5 MG tablet [id number] Yes TAKE 1 TABLET EVERY DAY Kathleene Hazel, MD Taking Active   aspirin EC 81 MG tablet [id number]  Take 81 mg by  mouth every evening.  [provider]  Active Self  benzonatate (TESSALON) 100 MG capsule [id number]  Take 1 capsule (100 mg total) by mouth 3 (three) times daily as needed for cough. Zenia Resides, MD  Active   Blood Glucose Monitoring Suppl (TRUE METRIX AIR GLUCOSE METER) w/Device Andria Rhein [id number]    Patient not taking: Reported on 03/18/2023   [provider]  Active     Discontinued 05/27/11 1120  (Discontinued by provider)   dapagliflozin propanediol (FARXIGA) 10 MG TABS tablet [id number] Yes Take 1 tablet (10 mg total) by mouth daily before breakfast. Etta Grandchild, MD Taking Active   Evolocumab [facility] SURECLICK) 140 MG/ML Ivory Broad [id number] Yes Inject 140 mg into the skin every 14 (fourteen) days. Kathleene Hazel, MD Taking Active   levothyroxine (SYNTHROID) 125 MCG tablet [id number]  Take 125 mcg by mouth daily. [provider]  Active   metoprolol succinate (TOPROL-XL) 50 MG 24 hr tablet [id number] Yes TAKE 1 TABLET (50 MG TOTAL) BY MOUTH DAILY. TAKE WITH OR IMMEDIATELY FOLLOWING A MEAL. Kathleene Hazel, MD Taking Active   nitroGLYCERIN (NITROSTAT) 0.4 MG SL tablet [id number]  Place 1 tablet (0.4 mg total) under the tongue every 5 (five) minutes as needed for chest pain. May take up to a total of 3 doses, if chest pain is not relieved after 2nd dose call 911 then take 3rd dose. Reather Littler D, NP  Active   olmesartan (BENICAR) 20 MG tablet [id number] Yes TAKE 1 TABLET DAILY Etta Grandchild, MD Taking Active     Discontinued 05/27/11 1121 (Discontinued by provider)   rosuvastatin (CRESTOR) 10 MG tablet [id number] Yes TAKE 1 TABLET THREE TIMES WEEKLY Etta Grandchild, MD Taking Active   Med List Note Scarlett Presto, [facility] 04/01/12 1637): l              Assessment/Plan:   Diabetes: - Currently controlled, A1c goal <7% - Reviewed long term cardiovascular and renal outcomes of uncontrolled blood sugar - Reviewed dietary modifications extensively including focusing on balanced meals/snacks, carb portion sizes, increasing fiber and protein - Marcelline Deist does not appear to have been sent to pt's new pharmacy yet. [facility]. Shouldn't require PA. - Does not meet PAP requirements due to income  HTN: -Currently over controlled, BP goal <130/80 - Recommended checking BP at home regularly and keep a log - - Decrease amlodipine to 2.5 mg daily (1/2 tablet  daily)  Follow Up Plan:  1/27  Arbutus Leas, PharmD, BCPS, CPP Clinical Pharmacist Practitioner [place] [facility] at [facility] [phone]

## 2023-04-01 NOTE — Patient Instructions (Signed)
It was a pleasure speaking with you today!  Decrease amlodipine to 1/2 tablet daily and continue monitoring blood pressures.   Feel free to call with any questions or concerns!  Arbutus Leas, PharmD, BCPS, CPP Clinical Pharmacist Practitioner [place] [facility] at [facility] [phone]

## 2023-04-06 DIAGNOSIS — E890 Postprocedural hypothyroidism: Secondary | ICD-10-CM | POA: Diagnosis not present

## 2023-04-06 DIAGNOSIS — E119 Type 2 diabetes mellitus without complications: Secondary | ICD-10-CM | POA: Diagnosis not present

## 2023-04-06 LAB — TSH: TSH: 1.27 (ref 0.41–5.90)

## 2023-04-11 ENCOUNTER — Telehealth: Payer: Self-pay

## 2023-04-11 ENCOUNTER — Other Ambulatory Visit (INDEPENDENT_AMBULATORY_CARE_PROVIDER_SITE_OTHER): Payer: Medicare Other | Admitting: Pharmacist

## 2023-04-11 VITALS — BP 110/77

## 2023-04-11 DIAGNOSIS — I10 Essential (primary) hypertension: Secondary | ICD-10-CM

## 2023-04-11 NOTE — Progress Notes (Signed)
04/13/2023 Name: Devon Brady MRN: [id number] DOB: [birth year]  Chief Complaint  Patient presents with   Diabetes   Hypertension   Medication Management    Devon Brady is a 73 y.o. year old male who presented for a telephone visit.   They were referred to the pharmacist by their PCP for assistance in managing diabetes and CKD .   Subjective:  Care Team: Primary Care Provider: Etta Grandchild, MD ; Next Scheduled Visit: none scheduled Dietitian consult 03/18/2023  Medication Access/Adherence  Current Pharmacy:  [facility] - [place], [place] - [address] [place] [address] Phone: [phone] Fax: [phone]  [id number] [place], [place] - [address] [facility] [address] AT [facility] [address] [place] [address] Phone: [phone] Fax: [phone]  [id number] [place], [place] - [address] AT [facility] [address] [place] [address] Phone: [phone] Fax: [phone]  [facility] [place], [place] - [address] [facility] AT Portal to Registered Caremark Sites One [place] [address] Phone: [phone] Fax: [phone]   Patient reports affordability concerns with their medications: Yes  Patient reports access/transportation concerns to their pharmacy: No  Patient reports adherence concerns with their medications:  Yes     Diabetes:  Current medications: Farxiga 10 mg daily  Medications tried in the past:   Current glucose readings: has a glucometer but has not been checking  Current meal patterns:  He has done a lot of research on diet. Trying to follow Mediterranean diet. Has Increased veggies, has baked chicken and fish, cut out bread For dessert has been having ricotta with slices fruit, cottage cheese. Using Stevia and monk fruit as sweeteners  Current physical  activity: Walks the dog, 1 mile daily  HTN: Current medications: amlodipine 5 mg 1/2 tablet daily, olmesartan 20 mg daily (started 10/29), metoprolol succinate 50 mg daily **Pt was supposed to reduce amlodipine to 1/2 tablet daily but during call discovered he was actually cutting olmesartan in 1/2 since last appt  Home BP:  1/21  AM 119/86  1/22 AM 117/76 HS 116/83  1/23 AM 125/74 AM 118/83 HS 118/79  1/24 AM 117/76 HS 120/80  1/25 AM 121/74 HA 129/82  1/26  AM 108/71 HS 111/77  1/27 AM 109/71, 110/77  Reports no more episodes of dizziness or falling    CKD: Current medications: olmesartan 20 mg daily, Farxiga 10 mg daily  Objective: BP Readings from Last 3 Encounters:  04/13/23 110/77  04/01/23 120/80  03/28/23 94/69     Lab Results  Component Value Date   HGBA1C 6.8 (H) 01/10/2023    Lab Results  Component Value Date   CREATININE 1.46 01/10/2023   BUN 18 01/10/2023   NA 140 01/10/2023   K 4.3 01/10/2023   CL 105 01/10/2023   CO2 29 01/10/2023    Lab Results  Component Value Date   CHOL 131 01/10/2023   HDL 51.20 01/10/2023   LDLCALC 65 01/10/2023   TRIG 73.0 01/10/2023   CHOLHDL 3 01/10/2023    Medications Reviewed Today   Medications were not reviewed in this encounter       Assessment/Plan:   Diabetes: - Currently controlled, A1c goal <7% - Reviewed long term cardiovascular and renal outcomes of uncontrolled blood sugar - Reviewed dietary modifications extensively including focusing on balanced meals/snacks, carb portion sizes, increasing fiber and  protein   HTN: -Currently controlled, BP goal <130/80. There was a concern for hypotension in setting of dizziness and 1 fall. - Recommended checking BP at home regularly and keep a log - Patient will resume olmesartan 20 mg 1 tablet daily and take amlodipine 5 mg 1/2 tablet daily  Follow Up Plan:  2/3  Arbutus Leas, PharmD, BCPS, CPP Clinical Pharmacist  Practitioner [place] [facility] at [facility] [phone]

## 2023-04-11 NOTE — Telephone Encounter (Signed)
Copied from CRM [id number]. Topic: Appointments - Scheduling Inquiry for Clinic >> Apr 11, 2023 10:53 AM Gaetano Hawthorne wrote: Reason for CRM: Patient is calling to see if he can reschedule his appointment with the pharmacist today - he's currently at the dentist and doesn't believe he'll be avalaible at 1 pm. Please call him at [phone]

## 2023-04-13 DIAGNOSIS — E119 Type 2 diabetes mellitus without complications: Secondary | ICD-10-CM | POA: Diagnosis not present

## 2023-04-13 DIAGNOSIS — C73 Malignant neoplasm of thyroid gland: Secondary | ICD-10-CM | POA: Diagnosis not present

## 2023-04-13 DIAGNOSIS — I10 Essential (primary) hypertension: Secondary | ICD-10-CM | POA: Diagnosis not present

## 2023-04-13 DIAGNOSIS — E890 Postprocedural hypothyroidism: Secondary | ICD-10-CM | POA: Diagnosis not present

## 2023-04-13 NOTE — Patient Instructions (Signed)
It was a pleasure speaking with you today!  Take amlodipine 5 mg 1/2 tablet daily and olmesartan 20 mg 1 tablet daily. Keep recording blood pressures to go over on next call.  Feel free to call with any questions or concerns!  Arbutus Leas, PharmD, BCPS, CPP Clinical Pharmacist Practitioner [place] [facility] at [facility] [phone]

## 2023-04-18 ENCOUNTER — Other Ambulatory Visit (HOSPITAL_COMMUNITY): Payer: Self-pay

## 2023-04-18 ENCOUNTER — Other Ambulatory Visit (INDEPENDENT_AMBULATORY_CARE_PROVIDER_SITE_OTHER): Payer: Medicare Other | Admitting: Pharmacist

## 2023-04-18 DIAGNOSIS — I10 Essential (primary) hypertension: Secondary | ICD-10-CM

## 2023-04-18 MED ORDER — AMLODIPINE BESYLATE 2.5 MG PO TABS: 2.5000 mg | ORAL_TABLET | Freq: Every day | ORAL | 1 refills | Status: DC

## 2023-04-18 NOTE — Progress Notes (Signed)
04/18/2023 Name: JHOEL STIEG MRN: [id number] DOB: [birth year]  Chief Complaint  Patient presents with   Hypertension   Medication Management    Devon Brady is a 73 y.o. year old male who presented for a telephone visit.   They were referred to the pharmacist by their PCP for assistance in managing diabetes and CKD .    Subjective:  Care Team: Primary Care Provider: Etta Grandchild, MD ; Next Scheduled Visit: none scheduled Dietitian consult 03/18/2023  Medication Access/Adherence  Current Pharmacy[facility]  [facility] [place], [place] - One [facility] AT Portal to Registered Caremark Sites One [place] [address] Phone: [phone] Fax: [phone]  [id number] [place], [place] - [address] [facility] [address] [facility] [address] [place] [address] Phone: [phone] Fax: [phone]  [id number] [place] [place] - [address] AT [facility] [address] [place] [address] Phone: [phone] Fax: [phone]   Patient reports affordability concerns with their medications: Yes  Patient reports access/transportation concerns to their pharmacy: No  Patient reports adherence concerns with their medications:  Yes     Diabetes:  Current medications: Farxiga 10 mg daily  Medications tried in the past:   Current glucose readings: has a glucometer but has not been checking  Current meal patterns:  He has done a lot of research on diet. Trying to follow Mediterranean diet. Has Increased veggies, has baked chicken and fish, cut out bread For dessert has been having ricotta with slices fruit, cottage cheese. Using Stevia and monk fruit as sweeteners  Current physical activity: Walks the dog, 1 mile daily  HTN: Current medications: amlodipine 5 mg 1/2 tablet daily, olmesartan 20 mg daily (started 10/29), metoprolol succinate 50 mg  daily  Home BP:  1/28 127/81 bedtime 1/29 AM 115/72, PM 126/80 1/30 AM 127/80, PM 116/79 1/31 AM 118/72 2/1 AM 121/83, PM 117/81 2/2 AM 118/71, PM 119/81 2/3 AM 118/79   Reports no more episodes of dizziness or falling    CKD: Current medications: olmesartan 20 mg daily, Farxiga 10 mg daily  Objective: BP Readings from Last 3 Encounters:  04/18/23 118/79  04/13/23 110/77  04/01/23 120/80     Lab Results  Component Value Date   HGBA1C 6.8 (H) 01/10/2023    Lab Results  Component Value Date   CREATININE 1.46 01/10/2023   BUN 18 01/10/2023   NA 140 01/10/2023   K 4.3 01/10/2023   CL 105 01/10/2023   CO2 29 01/10/2023    Lab Results  Component Value Date   CHOL 131 01/10/2023   HDL 51.20 01/10/2023   LDLCALC 65 01/10/2023   TRIG 73.0 01/10/2023   CHOLHDL 3 01/10/2023    Medications Reviewed Today   Medications were not reviewed in this encounter       Assessment/Plan:   Diabetes: - Currently controlled, A1c goal <7% - Reviewed long term cardiovascular and renal outcomes of uncontrolled blood sugar - Reviewed dietary modifications extensively including focusing on balanced meals/snacks, carb portion sizes, increasing fiber and protein   HTN: -Currently controlled, BP goal <130/80. There was a concern for hypotension in setting of dizziness and 1 fall - amlodipine was reduced and low BP readings have subsided - Recommended checking BP at home regularly and keep a log - Continue current regimen - Will send amlodipine 2.5 mg tablets to his pharmacy to avoid having to cut  5 mg in half.  HLD: Pt notes he has not received his repatha. Noted PA was approved. Per cardio PharmD, [facility] be able to use his [facility] - pt notified  Follow Up Plan:  PRN  Arbutus Leas, PharmD, BCPS, CPP Clinical Pharmacist Practitioner [place] [facility] at [facility] [phone]

## 2023-04-18 NOTE — Patient Instructions (Signed)
It was a pleasure speaking with you today!  Continue amlodipine 2.5 mg daily, olmesartan 20 mg daily, and meotprolol 50 mg daily.  Amlodipine 2.5 mg tablets has been sent to [facility] order.  Feel free to call with any questions or concerns!  Arbutus Leas, PharmD, BCPS, CPP Clinical Pharmacist Practitioner [place] [facility] at [facility] [phone]

## 2023-04-20 ENCOUNTER — Other Ambulatory Visit (HOSPITAL_COMMUNITY): Payer: Self-pay

## 2023-04-20 ENCOUNTER — Telehealth: Payer: Self-pay | Admitting: Cardiovascular Disease

## 2023-04-20 ENCOUNTER — Telehealth: Payer: Self-pay | Admitting: Pharmacy Technician

## 2023-04-20 NOTE — Telephone Encounter (Signed)
 Pt called in about his Repatha . He states the pharmacy told him it needs to be approved. I saw the PA was approved but he asked about his [employer] grant and if it needed to be renewed or not. Please advise.

## 2023-04-20 NOTE — Telephone Encounter (Signed)
 Ladar[place], [place] - 04/20/2023 11:27 AM Glenice Krabbe, CPhT  Sent: Wed April 20, 2023 12:24 PM  To: Gladis Porter HERO, LPN         Message  Hypercholesterolemia - Medicare Access     Status  Active     Balance  $2359.00     Start Date  12/12/2022     End Date  12/11/2023    Healthwell grant is still active and has funds    Pt aware of the above statement and healthwell grant still being active.   Pt asked if I could have Krabbe CPhT give him a call back today to exchange information about the grant to provide his pharmacy, for coverage of this medication.  Pt is aware I will route this message back to [place] to further follow-up with him about this matter.   Pt verbalized understanding and agrees with this plan.

## 2023-04-20 NOTE — Telephone Encounter (Signed)
 I called the pharmacy and gave them the new ins and grant information and they are processing it

## 2023-04-20 NOTE — Telephone Encounter (Signed)
 Glenice Krabbe, CPhT  Gladis Porter HERO, LPN Replies will be sent to [facility] Med Assistance Team Caller: Unspecified (Today, 11:27 AM) [facility] called [facility] and gave them all the information and they are processing it.   Pt aware that Krabbe CPhT called [facility] and provided them with all the information, and they are currently processing it.  Pt verbalized understanding and agrees with this plan.  Pt was gracious for all the assistance provided.

## 2023-04-20 NOTE — Telephone Encounter (Signed)
 Hypercholesterolemia - Medicare Access   Status Active   Balance $2359.00   Start Date 12/12/2022   End Date 12/11/2023  HealthWell ID [id number]

## 2023-05-25 DIAGNOSIS — R799 Abnormal finding of blood chemistry, unspecified: Secondary | ICD-10-CM | POA: Diagnosis not present

## 2023-05-25 DIAGNOSIS — N135 Crossing vessel and stricture of ureter without hydronephrosis: Secondary | ICD-10-CM | POA: Diagnosis not present

## 2023-05-25 DIAGNOSIS — I129 Hypertensive chronic kidney disease with stage 1 through stage 4 chronic kidney disease, or unspecified chronic kidney disease: Secondary | ICD-10-CM | POA: Diagnosis not present

## 2023-05-25 DIAGNOSIS — N1832 Chronic kidney disease, stage 3b: Secondary | ICD-10-CM | POA: Diagnosis not present

## 2023-05-25 LAB — HEMOGLOBIN A1C: Hemoglobin A1C: 6.1

## 2023-05-25 LAB — BASIC METABOLIC PANEL WITH GFR: Glucose: 113

## 2023-05-25 LAB — COMPREHENSIVE METABOLIC PANEL WITH GFR: eGFR: 42

## 2023-05-26 LAB — LAB REPORT - SCANNED
A1c: 6.1
Creatinine, POC: 128.5 mg/dL
EGFR: 42

## 2023-06-04 ENCOUNTER — Other Ambulatory Visit: Payer: Self-pay | Admitting: Internal Medicine

## 2023-06-04 DIAGNOSIS — I10 Essential (primary) hypertension: Secondary | ICD-10-CM

## 2023-06-04 DIAGNOSIS — I2510 Atherosclerotic heart disease of native coronary artery without angina pectoris: Secondary | ICD-10-CM

## 2023-06-04 DIAGNOSIS — E1129 Type 2 diabetes mellitus with other diabetic kidney complication: Secondary | ICD-10-CM

## 2023-06-04 DIAGNOSIS — N1831 Chronic kidney disease, stage 3a: Secondary | ICD-10-CM

## 2023-06-08 ENCOUNTER — Other Ambulatory Visit: Payer: Self-pay | Admitting: Internal Medicine

## 2023-06-08 DIAGNOSIS — E118 Type 2 diabetes mellitus with unspecified complications: Secondary | ICD-10-CM

## 2023-06-08 DIAGNOSIS — N1832 Chronic kidney disease, stage 3b: Secondary | ICD-10-CM

## 2023-07-11 ENCOUNTER — Ambulatory Visit (INDEPENDENT_AMBULATORY_CARE_PROVIDER_SITE_OTHER): Payer: Medicare HMO

## 2023-07-11 VITALS — Ht 69.5 in | Wt 230.0 lb

## 2023-07-11 DIAGNOSIS — E119 Type 2 diabetes mellitus without complications: Secondary | ICD-10-CM

## 2023-07-11 DIAGNOSIS — Z0000 Encounter for general adult medical examination without abnormal findings: Secondary | ICD-10-CM

## 2023-07-11 DIAGNOSIS — Z0100 Encounter for examination of eyes and vision without abnormal findings: Secondary | ICD-10-CM

## 2023-07-11 NOTE — Patient Instructions (Addendum)
 Devon Brady , Thank you for taking time to come for your Medicare Wellness Visit. I appreciate your ongoing commitment to your health goals. Please review the following plan we discussed and let me know if I can assist you in the future.   Referrals/Orders/Follow-Ups/Clinician Recommendations: Aim for 30 minutes of exercise or brisk walking, 6-8 glasses of water , and 5 servings of fruits and vegetables each day. Educated and advised on getting the COVID vaccine in 2025.  This is a list of the screening recommended for you and due dates:  Health Maintenance  Topic Date Due   Eye exam for diabetics  04/01/2023   COVID-19 Vaccine (8 - Moderna risk 2024-25 season) 06/07/2023   Complete foot exam   07/12/2023   Flu Shot  10/14/2023   Hemoglobin A1C  11/26/2023   Yearly kidney health urinalysis for diabetes  01/10/2024   Yearly kidney function blood test for diabetes  05/25/2024   Medicare Annual Wellness Visit  07/10/2024   Colon Cancer Screening  02/19/2026   DTaP/Tdap/Td vaccine (4 - Td or Tdap) 03/01/2032   Pneumonia Vaccine  Completed   Hepatitis C Screening  Completed   Zoster (Shingles) Vaccine  Completed   HPV Vaccine  Aged Out   Meningitis B Vaccine  Aged Out    Advanced directives: (Provided) Advance directive discussed with you today. I have provided a copy for you to complete at home and have notarized. Once this is complete, please bring a copy in to our office so we can scan it into your chart.   Next Medicare Annual Wellness Visit scheduled for next year: Yes

## 2023-07-11 NOTE — Progress Notes (Signed)
 Subjective:   Devon Brady is a 73 y.o. who presents for a Medicare Wellness preventive visit.  Visit Complete: Virtual I connected with  Devon Brady on 07/11/23 by a audio enabled telemedicine application and verified that I am speaking with the correct person using two identifiers.  Patient Location: Home  Provider Location: Office/Clinic  I discussed the limitations of evaluation and management by telemedicine. The patient expressed understanding and agreed to proceed.  Vital Signs: Because this visit was a virtual/telehealth visit, some criteria may be missing or patient reported. Any vitals not documented were not able to be obtained and vitals that have been documented are patient reported.  VideoDeclined- This patient declined Librarian, academic. Therefore the visit was completed with audio only.  Persons Participating in Visit: Patient.  AWV Questionnaire: No: Patient Medicare AWV questionnaire was not completed prior to this visit.  Cardiac Risk Factors include: advanced age (>58men, >86 women);diabetes mellitus;dyslipidemia;hypertension;obesity (BMI >30kg/m2);male gender     Objective:    Today's Vitals   07/11/23 1517  Weight: 230 lb (104.3 kg)  Height: 5' 9.5" (1.765 m)   Body mass index is 33.48 kg/m.     07/11/2023    3:16 PM 03/18/2023    2:07 PM 07/08/2022    4:18 PM 06/12/2021    8:50 AM 06/09/2020    2:32 PM 10/31/2015    8:49 AM 11/06/2014    9:02 AM  Advanced Directives  Does Patient Have a Medical Advance Directive? No No No Yes Yes Yes Yes  Type of Advance Directive    Living will;Healthcare Power of Attorney Living will;Healthcare Power of [employer] Power of [place];Living will Healthcare Power of [place];Living will  Does patient want to make changes to medical advance directive?    No - Patient declined No - Patient declined Yes - information given   Copy of Healthcare Power of Attorney in Chart?     No - copy requested No - copy requested No - copy requested No - copy requested  Would patient like information on creating a medical advance directive? Yes (MAU/Ambulatory/Procedural Areas - Information given) No - Patient declined No - Patient declined        Current Medications (verified) Outpatient Encounter Medications as of 07/11/2023  Medication Sig   amLODipine  (NORVASC ) 2.5 MG tablet Take 1 tablet (2.5 mg total) by mouth daily.   aspirin  EC 81 MG tablet Take 81 mg by mouth every evening.    Blood Glucose Monitoring Suppl (TRUE METRIX AIR GLUCOSE METER) w/Device KIT    Evolocumab  (REPATHA  SURECLICK) 140 MG/ML SOAJ Inject 140 mg into the skin every 14 (fourteen) days.   FARXIGA  10 MG TABS tablet TAKE 1 TABLET DAILY BEFORE BREAKFAST   levothyroxine  (SYNTHROID ) 125 MCG tablet Take 125 mcg by mouth daily.   metoprolol  succinate (TOPROL -XL) 50 MG 24 hr tablet TAKE 1 TABLET (50 MG TOTAL) BY MOUTH DAILY. TAKE WITH OR IMMEDIATELY FOLLOWING A MEAL.   nitroGLYCERIN  (NITROSTAT ) 0.4 MG SL tablet Place 1 tablet (0.4 mg total) under the tongue every 5 (five) minutes as needed for chest pain. May take up to a total of 3 doses, if chest pain is not relieved after 2nd dose call 911 then take 3rd dose.   rosuvastatin  (CRESTOR ) 10 MG tablet TAKE 1 TABLET THREE TIMES WEEKLY   [DISCONTINUED] benzonatate  (TESSALON ) 100 MG capsule Take 1 capsule (100 mg total) by mouth 3 (three) times daily as needed for cough.   olmesartan  (BENICAR )  20 MG tablet TAKE 1 TABLET DAILY (Patient not taking: Reported on 07/11/2023)   [DISCONTINUED] citalopram  (CELEXA ) 20 MG tablet Take 1 tablet (20 mg total) by mouth daily.   [DISCONTINUED] omeprazole  (PRILOSEC  OTC) 20 MG tablet Take 1 tablet (20 mg total) by mouth daily.   No facility-administered encounter medications on file as of 07/11/2023.    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   s/p total thyroidectomy   Renal insufficiency    Sleep apnea    No CPAP   TIA (transient ischemic attack) 2011   Past Surgical History:  Procedure Laterality Date   BONE MARROW ASPIRATE,BIOPSY, AND CLOT  06/11/11   LEFT ILIAC CREAST   BUNIONECTOMY Right 1978   foot   CARDIAC CATHETERIZATION  10/15/2009   COLONOSCOPY     CORONARY ARTERY BYPASS GRAFT  10/17/2009   LIMA to LAD,SVG to Ramus,SVG to OM sequential to OM2, SVG to marginal of RCA   ESOPHAGOGASTRODUODENOSCOPY     LEFT HEART CATHETERIZATION WITH CORONARY/GRAFT ANGIOGRAM N/A 10/03/2013   Procedure: LEFT HEART CATHETERIZATION WITH Estella Helling;  Surgeon: Mickiel Albany, MD;  Location: [facility] CATH LAB;  Service: Cardiovascular;  Laterality: N/A;   LYMPH NODE BIOPSY  06/07/11   RIGHT INGUINAL NODE: CLASSICAL HODGKIN'S LYMPHOMA, NODULAR SCLEROSIS TYPE   PORT-A-CATH REMOVAL Left 12/06/2013   Procedure: REMOVAL PORT-A-CATH;  Surgeon: Lockie Rima, MD;  Location: [facility];  Service: General;  Laterality: Left;   PORTACATH PLACEMENT  06/10/2011   Procedure: INSERTION PORT-A-CATH;  Surgeon: Lockie Rima, MD;  Location: [facility] ORS;  Service: General;  Laterality: Left;  subclavian    THYROIDECTOMY  03/31/2012   Procedure: THYROIDECTOMY;  Surgeon: Janita Mellow, MD;  Location: [facility] OR;  Service: ENT;  Laterality: N/A;   TONSILLECTOMY     as a child   VASECTOMY  1980   Family History  Problem Relation Age of Onset   Arthritis Mother    Hypertension Mother    Diabetes Father    Coronary artery disease Father 72       Died age 77   Kidney disease Father     Hypertension Sister    Hypertension Sister        2nd Sister   Alcohol abuse Brother    Prostate cancer Paternal Uncle        Great   Cancer Neg Hx    Drug abuse Neg Hx    Early death Neg Hx    Stroke Neg Hx    Social History   Socioeconomic History   Marital status: Married    Spouse name: Not on file   Number of children: 2   Years of education: Not on file   Highest education level: Bachelor's degree (e.g., BA, AB, BS)  Occupational History   Occupation: REGIONAL SPECIALIST    Employer: [employer]    Comment: Environmental Health.   Tobacco Use   Smoking  status: Never    Passive exposure: Never   Smokeless tobacco: Never  Vaping Use   Vaping status: Never Used  Substance and Sexual Activity   Alcohol use: Yes    Alcohol/week: 2.0 standard drinks of alcohol    Types: 1 Glasses of wine, 1 Cans of beer per week    Comment: 10/02/2013 "couple beers; couple times/month"   Drug use: No   Sexual activity: Never  Other Topics Concern   Not on file  Social History Narrative   Lives in home with wife Ammon Bales and Sister   Social Drivers of Health   Financial Resource Strain: Low Risk  (07/11/2023)   Overall Financial Resource Strain (CARDIA)    Difficulty of Paying Living Expenses: Not hard at all  Food Insecurity: No Food Insecurity (07/11/2023)   Hunger Vital Sign    Worried About Running Out of Food in the Last Year: Never true    Ran Out of Food in the Last Year: Never true  Transportation Needs: No Transportation Needs (07/11/2023)   PRAPARE - Administrator, Civil Service (Medical): No    Lack of Transportation (Non-Medical): No  Physical Activity: Insufficiently Active (07/11/2023)   Exercise Vital Sign    Days of Exercise per Week: 3 days    Minutes of Exercise per Session: 30 min  Stress: No Stress Concern Present (07/11/2023)   [employer] of Occupational Health - Occupational Stress Questionnaire    Feeling of Stress : Not at all   Social Connections: Socially Integrated (07/11/2023)   Social Connection and Isolation Panel [NHANES]    Frequency of Communication with Friends and Family: More than three times a week    Frequency of Social Gatherings with Friends and Family: More than three times a week    Attends Religious Services: More than 4 times per year    Active Member of [employer] or Organizations: Yes    Attends Engineer, structural: More than 4 times per year    Marital Status: Married    Tobacco Counseling Counseling given: No    Clinical Intake:  Pre-visit preparation completed: Yes  Pain : No/denies pain     BMI - recorded: 33.48 Nutritional Status: BMI > 30  Obese Nutritional Risks: None Diabetes: Yes CBG done?: No Did pt. bring in CBG monitor from home?: No  Lab Results  Component Value Date   HGBA1C 6.8 (H) 01/10/2023   HGBA1C 6.7 05/11/2022   HGBA1C 6.6 02/08/2022     How often do you need to have someone help you when you read instructions, pamphlets, or other written materials from your doctor or pharmacy?: 1 - Never  Interpreter Needed?: No  Information entered by :: Kandy Orris, CMA   Activities of Daily Living     07/11/2023    3:21 PM  In your present state of health, do you have any difficulty performing the following activities:  Hearing? 0  Vision? 0  Difficulty concentrating or making decisions? 0  Walking or climbing stairs? 0  Dressing or bathing? 0  Doing errands, shopping? 0  Preparing Food and eating ? N  Using the Toilet? N  In the past six months, have you accidently leaked urine? N  Do you have problems with loss of bowel control? N  Managing your Medications? N  Managing your Finances? N  Housekeeping or managing your Housekeeping? N    Patient Care Team: Arcadio Knuckles, MD as PCP - General (Internal Medicine) Antoinette Batman  D, MD as PCP - Cardiology (Cardiology) Nicolas Barren, MD as Consulting Physician  (Nephrology) Tasia Farr, MD as Consulting Physician (Endocrinology) Janita Mellow, MD as Consulting Physician (Otolaryngology) Ozell Blunt, MD as Consulting Physician (Gastroenterology) Almeda Jacobs, MD as Consulting Physician (Hematology and Oncology) Charity Conch, DPM as Consulting Physician (Podiatry) Nicolas Barren, MD as Consulting Physician (Nephrology) Dion Frankel, [facility] (Pharmacist) Rudine Cos, MD as Consulting Physician (Ophthalmology)  Indicate any recent Medical Services you may have received from other than [facility] providers in the past year (date may be approximate).     Assessment:   This is a routine wellness examination for Devon Brady.  Hearing/Vision screen Hearing Screening - Comments:: Wears hearing aids  Vision Screening - Comments:: Wears rx glasses - up to date with routine eye exams with Dr Roslynn Coombes   Goals Addressed               This Visit's Progress     Patient Stated (pt-stated)        Patient stated he plans to continue walking and lose weight - about 10lbs       Depression Screen     07/11/2023    3:25 PM 03/18/2023    2:07 PM 01/10/2023    8:09 AM 07/08/2022    4:39 PM 06/12/2021    8:36 AM 06/09/2020    2:31 PM 02/11/2020    9:07 AM  PHQ 2/9 Scores  PHQ - 2 Score 0 0 0 0 0 0 0  PHQ- 9 Score 1  0        Fall Risk     07/11/2023    3:23 PM 03/18/2023    2:07 PM 01/10/2023    8:09 AM 07/08/2022    4:41 PM 07/06/2022    9:29 AM  Fall Risk   Falls in the past year? 1 0 0 0 0  Number falls in past yr: 0 0 0 0 0  Comment 1      Injury with Fall? 0 0 0 0 0  Risk for fall due to :   No Fall Risks    Follow up Falls evaluation completed;Falls prevention discussed  Falls evaluation completed      MEDICARE RISK AT HOME:  Medicare Risk at Home Any stairs in or around the home?: No If so, are there any without handrails?: No Home free of loose throw rugs in walkways, pet beds, electrical cords, etc?: Yes Adequate lighting in  your home to reduce risk of falls?: Yes Life alert?: No Use of a cane, walker or w/c?: No Grab bars in the bathroom?: Yes Shower chair or bench in shower?: Yes Elevated toilet seat or a handicapped toilet?: Yes  TIMED UP AND GO:  Was the test performed?  No  Cognitive Function: 6CIT completed    06/12/2021    8:39 AM  MMSE - Mini Mental State Exam  Orientation to time 5  Orientation to Place 5  Registration 3  Attention/ Calculation 5  Recall 3  Language- name 2 objects 2  Language- repeat 1  Language- follow 3 step command 3  Language- read & follow direction 1  Write a sentence 1  Copy design 1  Total score 30        07/11/2023    3:26 PM 07/08/2022    4:38 PM  6CIT Screen  What Year? 0 points 0 points  What month? 0 points 0 points  What time? 0 points 0 points  Count back from  20 0 points 0 points  Months in reverse 0 points 0 points  Repeat phrase 2 points 0 points  Total Score 2 points 0 points    Immunizations Immunization History  Administered Date(s) Administered   DTaP 10/11/2008   Fluad Quad(high Dose 65+) 01/12/2021, 01/19/2022   Influenza Split 12/13/2011, 12/31/2019   Influenza, High Dose Seasonal PF 12/07/2018   Influenza, Quadrivalent, Recombinant, Inj, Pf 01/06/2018, 01/07/2020   Influenza,inj,Quad PF,6+ Mos 11/22/2012   Influenza,trivalent, recombinat, inj, PF 12/08/2022   Influenza-Unspecified 12/12/2015, 12/13/2016, 01/06/2018   Moderna Sars-Covid-2 Vaccination 03/13/2019, 04/10/2019, 12/04/2019, 07/22/2020   Pfizer Covid-19 Vaccine Bivalent Booster 61yrs & up 01/15/2021   Pfizer(Comirnaty)Fall Seasonal Vaccine 12 years and older 01/19/2022   Pneumococcal Conjugate-13 02/13/2014   Pneumococcal Polysaccharide-23 12/13/2011, 04/25/2017   Td 03/01/2022   Tdap 03/14/2012   Unspecified SARS-COV-2 Vaccination 12/08/2022   Zoster Recombinant(Shingrix ) 10/29/2020, 01/15/2021, 03/26/2021   Zoster, Live 01/09/2014    Screening Tests Health  Maintenance  Topic Date Due   OPHTHALMOLOGY EXAM  04/01/2023   COVID-19 Vaccine (8 - Moderna risk 2024-25 season) 06/07/2023   FOOT EXAM  07/12/2023   INFLUENZA VACCINE  10/14/2023   HEMOGLOBIN A1C  11/26/2023   Diabetic kidney evaluation - Urine ACR  01/10/2024   Diabetic kidney evaluation - eGFR measurement  05/25/2024   Medicare Annual Wellness (AWV)  07/10/2024   Colonoscopy  02/19/2026   DTaP/Tdap/Td (4 - Td or Tdap) 03/01/2032   Pneumonia Vaccine 70+ Years old  Completed   Hepatitis C Screening  Completed   Zoster Vaccines- Shingrix   Completed   HPV VACCINES  Aged Out   Meningococcal B Vaccine  Aged Out    Health Maintenance  Health Maintenance Due  Topic Date Due   OPHTHALMOLOGY EXAM  04/01/2023   COVID-19 Vaccine (8 - Moderna risk 2024-25 season) 06/07/2023   Health Maintenance Items Addressed: Referral sent to Optometry/Ophthalmology - Dr Roslynn Coombes  Additional Screening:  Vision Screening: Recommended annual ophthalmology exams for early detection of glaucoma and other disorders of the eye.  Dental Screening: Recommended annual dental exams for proper oral hygiene  Community Resource Referral / Chronic Care Management: CRR required this visit?  No   CCM required this visit?  No     Plan:     I have personally reviewed and noted the following in the patient's chart:   Medical and social history Use of alcohol, tobacco or illicit drugs  Current medications and supplements including opioid prescriptions. Patient is not currently taking opioid prescriptions. Functional ability and status Nutritional status Physical activity Advanced directives List of other physicians Hospitalizations, surgeries, and ER visits in previous 12 months Vitals Screenings to include cognitive, depression, and falls Referrals and appointments  In addition, I have reviewed and discussed with patient certain preventive protocols, quality metrics, and best practice recommendations.  A written personalized care plan for preventive services as well as general preventive health recommendations were provided to patient.     Patria Bookbinder, CMA   07/11/2023   After Visit Summary: (MyChart) Due to this being a telephonic visit, the after visit summary with patients personalized plan was offered to patient via MyChart   Notes: Nothing significant to report at this time.

## 2023-07-13 ENCOUNTER — Encounter: Payer: Self-pay | Admitting: Internal Medicine

## 2023-07-13 ENCOUNTER — Ambulatory Visit (INDEPENDENT_AMBULATORY_CARE_PROVIDER_SITE_OTHER): Payer: Medicare Other | Admitting: Internal Medicine

## 2023-07-13 VITALS — BP 122/78 | HR 64 | Temp 97.6°F | Resp 16 | Ht 69.5 in | Wt 231.8 lb

## 2023-07-13 DIAGNOSIS — E785 Hyperlipidemia, unspecified: Secondary | ICD-10-CM

## 2023-07-13 DIAGNOSIS — N1832 Chronic kidney disease, stage 3b: Secondary | ICD-10-CM | POA: Diagnosis not present

## 2023-07-13 DIAGNOSIS — I2510 Atherosclerotic heart disease of native coronary artery without angina pectoris: Secondary | ICD-10-CM | POA: Diagnosis not present

## 2023-07-13 DIAGNOSIS — E1129 Type 2 diabetes mellitus with other diabetic kidney complication: Secondary | ICD-10-CM | POA: Diagnosis not present

## 2023-07-13 DIAGNOSIS — E118 Type 2 diabetes mellitus with unspecified complications: Secondary | ICD-10-CM | POA: Diagnosis not present

## 2023-07-13 DIAGNOSIS — E1122 Type 2 diabetes mellitus with diabetic chronic kidney disease: Secondary | ICD-10-CM

## 2023-07-13 DIAGNOSIS — Z7984 Long term (current) use of oral hypoglycemic drugs: Secondary | ICD-10-CM

## 2023-07-13 DIAGNOSIS — N1831 Chronic kidney disease, stage 3a: Secondary | ICD-10-CM | POA: Diagnosis not present

## 2023-07-13 DIAGNOSIS — Z0000 Encounter for general adult medical examination without abnormal findings: Secondary | ICD-10-CM

## 2023-07-13 DIAGNOSIS — N400 Enlarged prostate without lower urinary tract symptoms: Secondary | ICD-10-CM | POA: Diagnosis not present

## 2023-07-13 DIAGNOSIS — R809 Proteinuria, unspecified: Secondary | ICD-10-CM | POA: Diagnosis not present

## 2023-07-13 DIAGNOSIS — I10 Essential (primary) hypertension: Secondary | ICD-10-CM | POA: Diagnosis not present

## 2023-07-13 DIAGNOSIS — Z0001 Encounter for general adult medical examination with abnormal findings: Secondary | ICD-10-CM

## 2023-07-13 LAB — BASIC METABOLIC PANEL WITH GFR
BUN: 22 mg/dL (ref 6–23)
CO2: 29 meq/L (ref 19–32)
Calcium: 9.5 mg/dL (ref 8.4–10.5)
Chloride: 106 meq/L (ref 96–112)
Creatinine, Ser: 1.68 mg/dL — ABNORMAL HIGH (ref 0.40–1.50)
GFR: 40.57 mL/min — ABNORMAL LOW (ref >60.00)
Glucose, Bld: 86 mg/dL (ref 70–99)
Potassium: 4.7 meq/L (ref 3.5–5.1)
Sodium: 141 meq/L (ref 135–145)

## 2023-07-13 LAB — CK: Total CK: 120 U/L (ref 7–232)

## 2023-07-13 LAB — CBC WITH DIFFERENTIAL/PLATELET
Basophils Absolute: 0 10*3/uL (ref 0.0–0.1)
Basophils Relative: 0.4 % (ref 0.0–3.0)
Eosinophils Absolute: 0.1 10*3/uL (ref 0.0–0.7)
Eosinophils Relative: 1.9 % (ref 0.0–5.0)
HCT: 50.5 % (ref 39.0–52.0)
Hemoglobin: 16.4 g/dL (ref 13.0–17.0)
Lymphocytes Relative: 20.6 % (ref 12.0–46.0)
Lymphs Abs: 1.2 10*3/uL (ref 0.7–4.0)
MCHC: 32.6 g/dL (ref 30.0–36.0)
MCV: 89.7 fl (ref 78.0–100.0)
Monocytes Absolute: 0.5 10*3/uL (ref 0.1–1.0)
Monocytes Relative: 8.3 % (ref 3.0–12.0)
Neutro Abs: 4 10*3/uL (ref 1.4–7.7)
Neutrophils Relative %: 68.8 % (ref 43.0–77.0)
Platelets: 204 10*3/uL (ref 150.0–400.0)
RBC: 5.63 Mil/uL (ref 4.22–5.81)
RDW: 14.5 % (ref 11.5–15.5)
WBC: 5.8 10*3/uL (ref 4.0–10.5)

## 2023-07-13 LAB — PSA: PSA: 3.83 ng/mL (ref 0.10–4.00)

## 2023-07-13 LAB — MAGNESIUM: Magnesium: 2.1 mg/dL (ref 1.5–2.5)

## 2023-07-13 MED ORDER — OLMESARTAN MEDOXOMIL 20 MG PO TABS: 20.0000 mg | ORAL_TABLET | Freq: Every day | ORAL | 0 refills | Status: DC

## 2023-07-13 MED ORDER — DAPAGLIFLOZIN PROPANEDIOL 10 MG PO TABS: 10.0000 mg | ORAL_TABLET | Freq: Every day | ORAL | 3 refills | Status: DC

## 2023-07-13 NOTE — Patient Instructions (Signed)
 Health Maintenance, Male  Adopting a healthy lifestyle and getting preventive care are important in promoting health and wellness. Ask your health care provider about:  The right schedule for you to have regular tests and exams.  Things you can do on your own to prevent diseases and keep yourself healthy.  What should I know about diet, weight, and exercise?  Eat a healthy diet    Eat a diet that includes plenty of vegetables, fruits, low-fat dairy products, and lean protein.  Do not eat a lot of foods that are high in solid fats, added sugars, or sodium.  Maintain a healthy weight  Body mass index (BMI) is a measurement that can be used to identify possible weight problems. It estimates body fat based on height and weight. Your health care provider can help determine your BMI and help you achieve or maintain a healthy weight.  Get regular exercise  Get regular exercise. This is one of the most important things you can do for your health. Most adults should:  Exercise for at least 150 minutes each week. The exercise should increase your heart rate and make you sweat (moderate-intensity exercise).  Do strengthening exercises at least twice a week. This is in addition to the moderate-intensity exercise.  Spend less time sitting. Even light physical activity can be beneficial.  Watch cholesterol and blood lipids  Have your blood tested for lipids and cholesterol at 72 years of age, then have this test every 5 years.  You may need to have your cholesterol levels checked more often if:  Your lipid or cholesterol levels are high.  You are older than 72 years of age.  You are at high risk for heart disease.  What should I know about cancer screening?  Many types of cancers can be detected early and may often be prevented. Depending on your health history and family history, you may need to have cancer screening at various ages. This may include screening for:  Colorectal cancer.  Prostate cancer.  Skin cancer.  Lung  cancer.  What should I know about heart disease, diabetes, and high blood pressure?  Blood pressure and heart disease  High blood pressure causes heart disease and increases the risk of stroke. This is more likely to develop in people who have high blood pressure readings or are overweight.  Talk with your health care provider about your target blood pressure readings.  Have your blood pressure checked:  Every 3-5 years if you are 9-95 years of age.  Every year if you are 85 years old or older.  If you are between the ages of 29 and 29 and are a current or former smoker, ask your health care provider if you should have a one-time screening for abdominal aortic aneurysm (AAA).  Diabetes  Have regular diabetes screenings. This checks your fasting blood sugar level. Have the screening done:  Once every three years after age 23 if you are at a normal weight and have a low risk for diabetes.  More often and at a younger age if you are overweight or have a high risk for diabetes.  What should I know about preventing infection?  Hepatitis B  If you have a higher risk for hepatitis B, you should be screened for this virus. Talk with your health care provider to find out if you are at risk for hepatitis B infection.  Hepatitis C  Blood testing is recommended for:  Everyone born from [birth year] through 1964.  Anyone  with known risk factors for hepatitis C.  Sexually transmitted infections (STIs)  You should be screened each year for STIs, including gonorrhea and chlamydia, if:  You are sexually active and are younger than 72 years of age.  You are older than 72 years of age and your health care provider tells you that you are at risk for this type of infection.  Your sexual activity has changed since you were last screened, and you are at increased risk for chlamydia or gonorrhea. Ask your health care provider if you are at risk.  Ask your health care provider about whether you are at high risk for HIV. Your health care provider  may recommend a prescription medicine to help prevent HIV infection. If you choose to take medicine to prevent HIV, you should first get tested for HIV. You should then be tested every 3 months for as long as you are taking the medicine.  Follow these instructions at home:  Alcohol use  Do not drink alcohol if your health care provider tells you not to drink.  If you drink alcohol:  Limit how much you have to 0-2 drinks a day.  Know how much alcohol is in your drink. In the [place] one drink equals one 12 oz bottle of beer (355 mL), one 5 oz glass of wine (148 mL), or one 1 oz glass of hard liquor (44 mL).  Lifestyle  Do not use any products that contain nicotine or tobacco. These products include cigarettes, chewing tobacco, and vaping devices, such as e-cigarettes. If you need help quitting, ask your health care provider.  Do not use street drugs.  Do not share needles.  Ask your health care provider for help if you need support or information about quitting drugs.  General instructions  Schedule regular health, dental, and eye exams.  Stay current with your vaccines.  Tell your health care provider if:  You often feel depressed.  You have ever been abused or do not feel safe at home.  Summary  Adopting a healthy lifestyle and getting preventive care are important in promoting health and wellness.  Follow your health care provider's instructions about healthy diet, exercising, and getting tested or screened for diseases.  Follow your health care provider's instructions on monitoring your cholesterol and blood pressure.  This information is not intended to replace advice given to you by your health care provider. Make sure you discuss any questions you have with your health care provider.  Document Revised: 07/21/2020 Document Reviewed: 07/21/2020  Elsevier Patient Education  2024 [employer].

## 2023-07-13 NOTE — Progress Notes (Signed)
 Subjective:  Patient ID: Devon Brady, male    DOB: June 17, 1950  Age: 73 y.o. MRN: [id number]  CC: Diabetes, Annual Exam, Hypertension, and Hyperlipidemia   HPI Devon Brady presents for a CPX and f/up ----  Discussed the use of AI scribe software for clinical note transcription with the patient, who gave verbal consent to proceed.  History of Present Illness   Devon Brady is a 73 year old male who presents for an annual physical exam.  He feels well overall and had a positive experience during his home visit on March 25th. He is scheduled for lab work the week of May 19th as part of his kidney care follow-up. His last eye exam was approximately a year ago, with a follow-up scheduled for May 16th. His last foot exam was also about a year ago, and he is not due for colon cancer screening for another two years.  He experiences frequent nocturia but denies straining or dribbling. He had an episode of dizziness and lightheadedness earlier in the year, attributed to low blood pressure after discontinuing amlodipine . He continues to take olmesartan  for blood pressure management. No current dizziness or lightheadedness.  No symptoms of thyroid  disease, blood sugar issues, or prostate cancer symptoms such as straining or dribbling.       Outpatient Medications Prior to Visit  Medication Sig Dispense Refill   aspirin  EC 81 MG tablet Take 81 mg by mouth every evening.      Blood Glucose Monitoring Suppl (TRUE METRIX AIR GLUCOSE METER) w/Device KIT      Evolocumab  (REPATHA  SURECLICK) 140 MG/ML SOAJ Inject 140 mg into the skin every 14 (fourteen) days. 6 mL 3   levothyroxine  (SYNTHROID ) 125 MCG tablet Take 125 mcg by mouth daily.     metoprolol  succinate (TOPROL -XL) 50 MG 24 hr tablet TAKE 1 TABLET (50 MG TOTAL) BY MOUTH DAILY. TAKE WITH OR IMMEDIATELY FOLLOWING A MEAL. 90 tablet 3   nitroGLYCERIN  (NITROSTAT ) 0.4 MG SL tablet Place 1 tablet (0.4 mg total) under the tongue every  5 (five) minutes as needed for chest pain. May take up to a total of 3 doses, if chest pain is not relieved after 2nd dose call 911 then take 3rd dose. 25 tablet 6   rosuvastatin  (CRESTOR ) 10 MG tablet TAKE 1 TABLET THREE TIMES WEEKLY 36 tablet 1   FARXIGA  10 MG TABS tablet TAKE 1 TABLET DAILY BEFORE BREAKFAST 90 tablet 0   olmesartan  (BENICAR ) 20 MG tablet TAKE 1 TABLET DAILY 90 tablet 0   amLODipine  (NORVASC ) 2.5 MG tablet Take 1 tablet (2.5 mg total) by mouth daily. 90 tablet 1   No facility-administered medications prior to visit.    ROS Review of Systems  Constitutional: Negative.  Negative for chills, diaphoresis, fatigue and unexpected weight change.  HENT: Negative.    Eyes: Negative.   Respiratory: Negative.  Negative for cough, chest tightness, shortness of breath and wheezing.   Cardiovascular:  Negative for chest pain, palpitations and leg swelling.  Gastrointestinal:  Negative for abdominal pain, blood in stool, constipation, nausea and vomiting.  Endocrine: Negative.   Genitourinary: Negative.  Negative for difficulty urinating.  Musculoskeletal:  Positive for myalgias. Negative for arthralgias, joint swelling and neck pain.  Skin: Negative.   Neurological: Negative.  Negative for dizziness, weakness and light-headedness.  Hematological:  Negative for adenopathy. Does not bruise/bleed easily.  Psychiatric/Behavioral: Negative.      Objective:  BP 122/78 (BP Location: Left Arm, Patient Position: Sitting)  Pulse 64   Temp 97.6 F (36.4 C) (Temporal)   Resp 16   Ht 5' 9.5" (1.765 m)   Wt 231 lb 12.8 oz (105.1 kg)   SpO2 97%   BMI 33.74 kg/m   BP Readings from Last 3 Encounters:  07/13/23 122/78  04/18/23 118/79  04/13/23 110/77    Wt Readings from Last 3 Encounters:  07/13/23 231 lb 12.8 oz (105.1 kg)  07/11/23 230 lb (104.3 kg)  01/10/23 239 lb 3.2 oz (108.5 kg)    Physical Exam Vitals reviewed.  Constitutional:      Appearance: Normal appearance.   HENT:     Nose: Nose normal.     Mouth/Throat:     Mouth: Mucous membranes are moist.  Eyes:     General: No scleral icterus.    Conjunctiva/sclera: Conjunctivae normal.  Cardiovascular:     Rate and Rhythm: Regular rhythm. Bradycardia present.     Heart sounds: No murmur heard.    No friction rub. No gallop.     Comments: EKG--- SB, 59 bpm Incomplete RBBB No LVH or Q waves Unchanged Pulmonary:     Effort: Pulmonary effort is normal.     Breath sounds: No stridor. No wheezing, rhonchi or rales.  Abdominal:     General: Abdomen is flat.     Palpations: There is no mass.     Tenderness: There is no abdominal tenderness. There is no guarding.     Hernia: No hernia is present. There is no hernia in the left inguinal area or right inguinal area.  Genitourinary:    Pubic Area: No rash.      Penis: Normal and circumcised.      Testes: Normal.     Epididymis:     Right: Normal.     Left: Normal.     Prostate: Enlarged. Not tender and no nodules present.     Rectum: Normal. Guaiac result negative. No mass, tenderness, anal fissure, external hemorrhoid or internal hemorrhoid. Normal anal tone.  Musculoskeletal:     Cervical back: Neck supple.     Right lower leg: No edema.     Left lower leg: No edema.  Lymphadenopathy:     Lower Body: No right inguinal adenopathy. No left inguinal adenopathy.  Skin:    General: Skin is warm and dry.     Findings: No lesion or rash.  Neurological:     General: No focal deficit present.     Mental Status: He is alert. Mental status is at baseline.  Psychiatric:        Mood and Affect: Mood normal.        Behavior: Behavior normal.     Lab Results  Component Value Date   WBC 5.8 07/13/2023   HGB 16.4 07/13/2023   HCT 50.5 07/13/2023   PLT 204.0 07/13/2023   GLUCOSE 86 07/13/2023   CHOL 131 01/10/2023   TRIG 73.0 01/10/2023   HDL 51.20 01/10/2023   LDLCALC 65 01/10/2023   ALT 10 01/10/2023   AST 16 01/10/2023   NA 141 07/13/2023    K 4.7 07/13/2023   CL 106 07/13/2023   CREATININE 1.68 (H) 07/13/2023   BUN 22 07/13/2023   CO2 29 07/13/2023   TSH 1.27 04/06/2023   PSA 3.83 07/13/2023   INR 0.97 10/02/2013   HGBA1C 6.1 05/25/2023   MICROALBUR 12.0 (H) 01/10/2023    No results found.  Assessment & Plan:   Benign prostatic hyperplasia without lower urinary tract  symptoms -     PSA; Future  Coronary artery disease involving native coronary artery of native heart without angina pectoris -     Olmesartan  Medoxomil; Take 1 tablet (20 mg total) by mouth daily.  Dispense: 90 tablet; Refill: 0  Type 2 diabetes mellitus with stage 3a chronic kidney disease, without long-term current use of insulin (HCC)- Blood sugar is well controlled. -     Basic metabolic panel with GFR; Future -     HM Diabetes Foot Exam -     Olmesartan  Medoxomil; Take 1 tablet (20 mg total) by mouth daily.  Dispense: 90 tablet; Refill: 0  Microalbuminuria due to type 2 diabetes mellitus (HCC) -     Dapagliflozin  Propanediol; Take 1 tablet (10 mg total) by mouth daily before breakfast.  Dispense: 90 tablet; Refill: 3 -     Olmesartan  Medoxomil; Take 1 tablet (20 mg total) by mouth daily.  Dispense: 90 tablet; Refill: 0  Essential hypertension- BP is well controlled. EKG is negative for LVH. -     Basic metabolic panel with GFR; Future -     CBC with Differential/Platelet; Future -     EKG 12-Lead -     Magnesium ; Future -     Olmesartan  Medoxomil; Take 1 tablet (20 mg total) by mouth daily.  Dispense: 90 tablet; Refill: 0  Stage 3b chronic kidney disease (HCC)- Will avoid nephrotoxic agents  -     Dapagliflozin  Propanediol; Take 1 tablet (10 mg total) by mouth daily before breakfast.  Dispense: 90 tablet; Refill: 3 -     Basic metabolic panel with GFR; Future -     Magnesium ; Future  Type II diabetes mellitus with manifestations (HCC) -     Dapagliflozin  Propanediol; Take 1 tablet (10 mg total) by mouth daily before breakfast.  Dispense:  90 tablet; Refill: 3  Hyperlipidemia with target LDL less than 100- LDL goal achieved. Doing well on the statin  -     CK; Future  Encounter for general adult medical examination with abnormal findings - Exam completed, labs reviewed, vaccines reviewed, cancer screenings are UTD, pt ed material was given.   Other orders -     EKG     Follow-up: Return in about 6 months (around 01/12/2024).  Sandra Crouch, MD

## 2023-07-14 ENCOUNTER — Encounter: Payer: Self-pay | Admitting: Internal Medicine

## 2023-08-01 DIAGNOSIS — H35372 Puckering of macula, left eye: Secondary | ICD-10-CM | POA: Diagnosis not present

## 2023-08-01 DIAGNOSIS — N1832 Chronic kidney disease, stage 3b: Secondary | ICD-10-CM | POA: Diagnosis not present

## 2023-08-01 DIAGNOSIS — H1131 Conjunctival hemorrhage, right eye: Secondary | ICD-10-CM | POA: Diagnosis not present

## 2023-08-01 DIAGNOSIS — Z961 Presence of intraocular lens: Secondary | ICD-10-CM | POA: Diagnosis not present

## 2023-08-01 DIAGNOSIS — H52203 Unspecified astigmatism, bilateral: Secondary | ICD-10-CM | POA: Diagnosis not present

## 2023-08-01 DIAGNOSIS — E119 Type 2 diabetes mellitus without complications: Secondary | ICD-10-CM | POA: Diagnosis not present

## 2023-08-01 LAB — HM DIABETES EYE EXAM

## 2023-09-09 ENCOUNTER — Telehealth: Payer: Self-pay | Admitting: Internal Medicine

## 2023-09-09 NOTE — Telephone Encounter (Signed)
 Copied from CRM [id number]. Topic: General - Other >> Sep 09, 2023  2:44 PM Thersia C wrote: Reason for CRM: remerra from [facility] wanted to call in regarding to patient abdnormal lab results   Please call back at  [phone]

## 2023-09-09 NOTE — Telephone Encounter (Signed)
 Spoke with them and they said they will fax the abnormal results to us .

## 2023-10-04 DIAGNOSIS — N1832 Chronic kidney disease, stage 3b: Secondary | ICD-10-CM | POA: Diagnosis not present

## 2023-10-11 ENCOUNTER — Telehealth: Payer: Self-pay | Admitting: Pharmacy Technician

## 2023-10-11 NOTE — Telephone Encounter (Signed)
 Pharmacy Patient Advocate Encounter  Received notification from [facility] that Prior Authorization for REPATHA  has been APPROVED from 10/11/23 to 03/14/24   PA #/Case ID/Reference #: [id number]

## 2023-10-11 NOTE — Telephone Encounter (Signed)
 Pharmacy Patient Advocate Encounter   Received notification from Onbase that prior authorization for repatha  140mg  is required/requested.   Insurance verification completed.   The patient is insured through [employer] .   Per test claim: PA required; PA submitted to above mentioned insurance via latent Key/confirmation #/EOC [id number] Status is pending

## 2023-10-18 ENCOUNTER — Other Ambulatory Visit: Payer: Self-pay | Admitting: Internal Medicine

## 2023-10-18 DIAGNOSIS — E118 Type 2 diabetes mellitus with unspecified complications: Secondary | ICD-10-CM

## 2023-10-18 DIAGNOSIS — E785 Hyperlipidemia, unspecified: Secondary | ICD-10-CM

## 2023-10-18 DIAGNOSIS — N1832 Chronic kidney disease, stage 3b: Secondary | ICD-10-CM

## 2023-10-18 DIAGNOSIS — E1129 Type 2 diabetes mellitus with other diabetic kidney complication: Secondary | ICD-10-CM

## 2023-10-18 NOTE — Telephone Encounter (Signed)
 Copied from CRM #[id number]. Topic: Clinical - Medication Refill >> Oct 18, 2023 11:51 AM Aisha D wrote: Medication: rosuvastatin  (CRESTOR ) 10 MG tablet, dapagliflozin  propanediol (FARXIGA ) 10 MG TABS tablet  Has the patient contacted their pharmacy? Yes (Agent: If no, request that the patient contact the pharmacy for the refill. If patient does not wish to contact the pharmacy document the reason why and proceed with request.) (Agent: If yes, when and what did the pharmacy advise?)  This is the patient's preferred pharmacy:  [id number] - [place], [place] - [address] [facility] [address] [place] [address] Phone: [phone] Fax: [phone]  Is this the correct pharmacy for this prescription? Yes If no, delete pharmacy and type the correct one.   Has the prescription been filled recently? No  Is the patient out of the medication? Yes  Has the patient been seen for an appointment in the last year OR does the patient have an upcoming appointment? Yes  Can we respond through MyChart? Yes  Agent: Please be advised that Rx refills may take up to 3 business days. We ask that you follow-up with your pharmacy.

## 2023-10-24 MED ORDER — DAPAGLIFLOZIN PROPANEDIOL 10 MG PO TABS: 10.0000 mg | ORAL_TABLET | Freq: Every day | ORAL | 1 refills | Status: DC

## 2023-10-24 MED ORDER — ROSUVASTATIN CALCIUM 10 MG PO TABS: ORAL_TABLET | ORAL | 1 refills | Status: DC

## 2023-12-07 ENCOUNTER — Other Ambulatory Visit (INDEPENDENT_AMBULATORY_CARE_PROVIDER_SITE_OTHER): Admitting: Pharmacist

## 2023-12-07 DIAGNOSIS — N1831 Chronic kidney disease, stage 3a: Secondary | ICD-10-CM

## 2023-12-07 DIAGNOSIS — E1122 Type 2 diabetes mellitus with diabetic chronic kidney disease: Secondary | ICD-10-CM

## 2023-12-07 NOTE — Progress Notes (Signed)
 Pharmacy Quality Measure Review  This patient is appearing on a report for being at risk of failing the Glycemic Status Assessment in Diabetes measure this calendar year.   Last documented A1c 6.1% on 05/25/23 at [facility] (results in Media tab).   No action needed at this time.   Catie [facility], PharmD, [facility] Clinical Pharmacist [phone]

## 2024-01-02 ENCOUNTER — Other Ambulatory Visit: Payer: Self-pay | Admitting: Cardiovascular Disease

## 2024-01-02 DIAGNOSIS — E782 Mixed hyperlipidemia: Secondary | ICD-10-CM

## 2024-01-02 DIAGNOSIS — I2510 Atherosclerotic heart disease of native coronary artery without angina pectoris: Secondary | ICD-10-CM

## 2024-01-23 ENCOUNTER — Encounter (INDEPENDENT_AMBULATORY_CARE_PROVIDER_SITE_OTHER): Payer: BC Managed Care – PPO | Admitting: Ophthalmology

## 2024-01-23 DIAGNOSIS — H35033 Hypertensive retinopathy, bilateral: Secondary | ICD-10-CM | POA: Diagnosis not present

## 2024-01-23 DIAGNOSIS — H43813 Vitreous degeneration, bilateral: Secondary | ICD-10-CM

## 2024-01-23 DIAGNOSIS — H353131 Nonexudative age-related macular degeneration, bilateral, early dry stage: Secondary | ICD-10-CM | POA: Diagnosis not present

## 2024-01-23 DIAGNOSIS — H35372 Puckering of macula, left eye: Secondary | ICD-10-CM | POA: Diagnosis not present

## 2024-01-23 DIAGNOSIS — I10 Essential (primary) hypertension: Secondary | ICD-10-CM

## 2024-01-26 ENCOUNTER — Other Ambulatory Visit: Payer: Self-pay | Admitting: Internal Medicine

## 2024-01-26 DIAGNOSIS — E118 Type 2 diabetes mellitus with unspecified complications: Secondary | ICD-10-CM

## 2024-01-26 DIAGNOSIS — N1832 Chronic kidney disease, stage 3b: Secondary | ICD-10-CM

## 2024-01-26 DIAGNOSIS — E1129 Type 2 diabetes mellitus with other diabetic kidney complication: Secondary | ICD-10-CM

## 2024-03-01 DIAGNOSIS — I2510 Atherosclerotic heart disease of native coronary artery without angina pectoris: Secondary | ICD-10-CM

## 2024-03-05 ENCOUNTER — Telehealth: Payer: Self-pay | Admitting: Cardiovascular Disease

## 2024-03-05 MED ORDER — METOPROLOL SUCCINATE ER 50 MG PO TB24: 50.0000 mg | ORAL_TABLET | Freq: Every day | ORAL | 0 refills | Status: DC

## 2024-03-05 NOTE — Telephone Encounter (Signed)
 Pt scheduled 04/09/24, refill sent.

## 2024-03-05 NOTE — Telephone Encounter (Signed)
" °*  STAT* If patient is at the pharmacy, call can be transferred to refill team.   1. Which medications need to be refilled? (please list name of each medication and dose if known)   metoprolol  succinate (TOPROL -XL) 50 MG 24 hr tablet    2. Would you like to learn more about the convenience, safety, & potential cost savings by using the [facility]? no   3. Are you open to using the [facility] (Type [facility]. no   4. Which pharmacy/location (including street and city if local pharmacy) is medication to be sent to?  [id number] - [place], [place] - [address]    5. Do they need a 30 day or 90 day supply? 90 day   Pt is completely out.  "

## 2024-03-23 ENCOUNTER — Ambulatory Visit

## 2024-03-23 ENCOUNTER — Encounter: Payer: Self-pay | Admitting: Internal Medicine

## 2024-03-23 ENCOUNTER — Ambulatory Visit: Admitting: Internal Medicine

## 2024-03-23 VITALS — BP 122/80 | HR 60 | Temp 98.5°F | Ht 69.5 in | Wt 234.0 lb

## 2024-03-23 DIAGNOSIS — I10 Essential (primary) hypertension: Secondary | ICD-10-CM

## 2024-03-23 DIAGNOSIS — C73 Malignant neoplasm of thyroid gland: Secondary | ICD-10-CM | POA: Insufficient documentation

## 2024-03-23 DIAGNOSIS — M19039 Primary osteoarthritis, unspecified wrist: Secondary | ICD-10-CM | POA: Insufficient documentation

## 2024-03-23 DIAGNOSIS — M19031 Primary osteoarthritis, right wrist: Secondary | ICD-10-CM | POA: Diagnosis not present

## 2024-03-23 NOTE — Assessment & Plan Note (Signed)
 BP Readings from Last 3 Encounters:  03/23/24 122/80  07/13/23 122/78  04/18/23 118/79   Stable, pt to continue medical treatment toprol  xl 50 every day, benicar  20 qd

## 2024-03-23 NOTE — Assessment & Plan Note (Signed)
 With recent flare, ok for OTC Voltaren  gel prn, for xray today, refer Dr Camella hand surgury, and script given for right wrist splint,  to f/u any worsening symptoms or concerns

## 2024-03-23 NOTE — Progress Notes (Signed)
 Patient ID: Devon Brady, male   DOB: 11/14/1950, 74 y.o.   MRN: [id number]        Chief Complaint: follow up right wrist pain       HPI:  Devon Brady is a 74 y.o. male here with c/o 2 wks onset mod to severe intermittent localized pain to the right anterolateral wrist with mild tender and swelling; Pt denies chest pain, increased sob or doe, wheezing, orthopnea, PND, increased LE swelling, palpitations, dizziness or syncope.   Pt denies polydipsia, polyuria, or new focal neuro s/s.          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   Nocturia    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/p total thyroidectomy   Renal insufficiency    Sleep apnea    No CPAP   TIA (transient ischemic attack) 03/15/2009   Past Surgical History:  Procedure Laterality Date   APPENDECTOMY     BONE MARROW ASPIRATE,BIOPSY, AND CLOT  06/11/2011   LEFT ILIAC CREAST   BUNIONECTOMY Right 03/15/1976   foot   CARDIAC CATHETERIZATION  10/15/2009   COLONOSCOPY     CORONARY ARTERY BYPASS GRAFT  10/17/2009   LIMA to LAD,SVG to Ramus,SVG to OM sequential to OM2, SVG to marginal of RCA   ESOPHAGOGASTRODUODENOSCOPY     EYE SURGERY     See records   LEFT HEART CATHETERIZATION WITH CORONARY/GRAFT ANGIOGRAM N/A 10/03/2013   Procedure: LEFT HEART CATHETERIZATION WITH EL BILE;  Surgeon:  Victory LELON Claudene DOUGLAS, MD;  Location: [facility] CATH LAB;  Service: Cardiovascular;  Laterality: N/A;   LYMPH NODE BIOPSY  06/07/2011   RIGHT INGUINAL NODE: CLASSICAL HODGKIN'S LYMPHOMA, NODULAR SCLEROSIS TYPE   PORT-A-CATH REMOVAL Left 12/06/2013   Procedure: REMOVAL PORT-A-CATH;  Surgeon: Jina Nephew, MD;  Location: [facility];  Service: General;  Laterality: Left;   PORTACATH PLACEMENT  06/10/2011   Procedure: INSERTION PORT-A-CATH;  Surgeon: Jina Nephew, MD;  Location: [facility] ORS;  Service: General;  Laterality: Left;  subclavian    THYROIDECTOMY  03/31/2012   Procedure: THYROIDECTOMY;  Surgeon: Ida Loader, MD;  Location: [facility] OR;  Service: ENT;  Laterality: N/A;   TONSILLECTOMY     as a child   VASECTOMY  03/15/1978    reports that he has never smoked. He has never been exposed to tobacco smoke. He has never used smokeless tobacco. He reports current alcohol use of about 2.0 standard drinks of alcohol per week. He reports that he does not use drugs. family history includes Alcohol abuse in his brother; Arthritis in his mother; Coronary artery disease (age of onset: 44) in his father; Diabetes in his father; Early death in his father; Heart disease  in his brother and father; Hypertension in his mother, sister, and sister; Kidney disease in his father; Obesity in his mother, sister, and sister; Prostate cancer in his paternal uncle. Allergies[1] Medications Ordered Prior to Encounter[2]      ROS:  All others reviewed and negative.  Objective        PE:  BP 122/80 (BP Location: Right Arm, Patient Position: Sitting, Cuff Size: Normal)   Pulse 60   Temp 98.5 F (36.9 C) (Oral)   Ht 5' 9.5 (1.765 m)   Wt 234 lb (106.1 kg)   SpO2 96%   BMI 34.06 kg/m                 Constitutional: Pt appears in NAD               HENT: Head: NCAT.                Right Ear: External ear normal.                 Left Ear: External ear normal.                Eyes: . Pupils are equal, round, and  reactive to light. Conjunctivae and EOM are normal               Nose: without d/c or deformity               Neck: Neck supple. Gross normal ROM               Cardiovascular: Normal rate and regular rhythm.                 Pulmonary/Chest: Effort normal and breath sounds without rales or wheezing.                Right wrist with non discrete swelling and tender to the anterolateral right wrist.               Neurological: Pt is alert. At baseline orientation, motor grossly intact               Skin: Skin is warm. No rashes, no other new lesions, LE edema - none               Psychiatric: Pt behavior is normal without agitation   Micro: none  Cardiac tracings I have personally interpreted today:  none  Pertinent Radiological findings (summarize): none   Lab Results  Component Value Date   WBC 5.8 07/13/2023   HGB 16.4 07/13/2023   HCT 50.5 07/13/2023   PLT 204.0 07/13/2023   GLUCOSE 86 07/13/2023   CHOL 131 01/10/2023   TRIG 73.0 01/10/2023   HDL 51.20 01/10/2023   LDLCALC 65 01/10/2023   ALT 10 01/10/2023   AST 16 01/10/2023   NA 141 07/13/2023   K 4.7 07/13/2023   CL 106 07/13/2023   CREATININE 1.68 (H) 07/13/2023   BUN 22 07/13/2023   CO2 29 07/13/2023   TSH 1.27 04/06/2023   PSA 3.83 07/13/2023   INR 0.97 10/02/2013   HGBA1C 6.1 05/25/2023   MICROALBUR 17 05/12/2022   Assessment/Plan:  Devon Brady is a 74 y.o. Black or African American [2] male with  has a past medical history of CAD (coronary artery disease), Cataract, H/O colonoscopy (03/16/2011), H/O hiatal hernia, Hemorrhoid, History of colon polyps, Hodgkin's lymphoma (HCC), Hydronephrosis, Hyperlipidemia, Hypertension, Hypothyroidism, Myocardial infarction (HCC) (03/15/2009), Neuropathy due to chemotherapeutic drug, Nocturia, Papillary  thyroid  carcinoma (HCC) (03/15/2012), Renal insufficiency, Sleep apnea, and TIA (transient ischemic attack) (03/15/2009).  Wrist arthritis With recent flare, ok for OTC  Voltaren  gel prn, for xray today, refer Dr Camella hand surgury, and script given for right wrist splint,  to f/u any worsening symptoms or concerns   Essential hypertension BP Readings from Last 3 Encounters:  03/23/24 122/80  07/13/23 122/78  04/18/23 118/79   Stable, pt to continue medical treatment toprol  xl 50 every day, benicar  20 qd  Followup: Return if symptoms worsen or fail to improve.  Lynwood Rush, MD 03/23/2024 2:33 PM [facility]     [1] No Active Allergies [2]  Current Outpatient Medications on File Prior to Visit  Medication Sig Dispense Refill   aspirin  EC 81 MG tablet Take 81 mg by mouth every evening.      Blood Glucose Monitoring Suppl (TRUE METRIX AIR GLUCOSE METER) w/Device KIT      Evolocumab  (REPATHA  SURECLICK) 140 MG/ML SOAJ INJECT 140 MG INTO THE SKIN EVERY 14 (FOURTEEN) DAYS. 2 mL 0   FARXIGA  10 MG TABS tablet TAKE 1 TABLET DAILY BEFORE BREAKFAST 90 tablet 0   levothyroxine  (SYNTHROID ) 125 MCG tablet Take 125 mcg by mouth daily.     metoprolol  succinate (TOPROL -XL) 50 MG 24 hr tablet Take 1 tablet (50 mg total) by mouth daily. Take with or immediately following a meal. 30 tablet 0   nitroGLYCERIN  (NITROSTAT ) 0.4 MG SL tablet Place 1 tablet (0.4 mg total) under the tongue every 5 (five) minutes as needed for chest pain. May take up to a total of 3 doses, if chest pain is not relieved after 2nd dose call 911 then take 3rd dose. 25 tablet 6   olmesartan  (BENICAR ) 20 MG tablet Take 1 tablet (20 mg total) by mouth daily. 90 tablet 0   rosuvastatin  (CRESTOR ) 10 MG tablet TAKE 1 TABLET THREE TIMES WEEKLY 36 tablet 1   [DISCONTINUED] citalopram  (CELEXA ) 20 MG tablet Take 1 tablet (20 mg total) by mouth daily. 30 tablet 1   [DISCONTINUED] omeprazole  (PRILOSEC  OTC) 20 MG tablet Take 1 tablet (20 mg total) by mouth daily. 28 tablet 1   No current facility-administered medications on file prior to visit.

## 2024-03-23 NOTE — Patient Instructions (Addendum)
 We will see if we have the right wrist splint in stock today, if not you are given the script for the splint to take to [facility] to use the OTC Voltaren  gel topically to the wrist as needed  Please continue all other medications as before, and refills have been done if requested.  Please have the pharmacy call with any other refills you may need.  Please keep your appointments with your specialists as you may have planned  You will be contacted regarding the referral for: Dr Camella - [facility]  Please go to the XRAY Department in the first floor for the x-ray testing  You will be contacted by phone if any changes need to be made immediately.  Otherwise, you will receive a letter about your results with an explanation, but please check with MyChart first.

## 2024-03-26 ENCOUNTER — Encounter: Payer: Self-pay | Admitting: *Deleted

## 2024-03-26 NOTE — Progress Notes (Signed)
 Devon Brady                                          MRN: [id number]   03/26/2024   The [facility] Quality Team Specialist reviewed this patient medical record for the purposes of chart review for care gap closure. The following were reviewed: chart review for care gap closure-glycemic status assessment.    [facility]I Quality Team

## 2024-03-27 ENCOUNTER — Other Ambulatory Visit: Payer: Self-pay | Admitting: Cardiovascular Disease

## 2024-03-30 ENCOUNTER — Ambulatory Visit: Payer: Self-pay | Admitting: Internal Medicine

## 2024-04-02 ENCOUNTER — Other Ambulatory Visit: Payer: Self-pay | Admitting: Cardiovascular Disease

## 2024-04-05 ENCOUNTER — Telehealth: Payer: Self-pay | Admitting: Cardiovascular Disease

## 2024-04-05 NOTE — Telephone Encounter (Signed)
" °*  STAT* If patient is at the pharmacy, call can be transferred to refill team.   1. Which medications need to be refilled? (please list name of each medication and dose if known)   metoprolol  succinate (TOPROL -XL) 50 MG 24 hr tablet     2. Would you like to learn more about the convenience, safety, & potential cost savings by using the [facility]? No    3. Are you open to using the [facility] (Type [facility].  ). No   4. Which pharmacy/location (including street and city if local pharmacy) is medication to be sent to?[id number] - [place], [place] - [address] AT [address]    5. Do they need a 30 day or 90 day supply? 90   Pt is currently out of medication   "

## 2024-04-09 ENCOUNTER — Ambulatory Visit: Admitting: Cardiovascular Disease

## 2024-04-09 ENCOUNTER — Other Ambulatory Visit: Payer: Self-pay

## 2024-04-09 ENCOUNTER — Telehealth: Payer: Self-pay

## 2024-04-09 DIAGNOSIS — E785 Hyperlipidemia, unspecified: Secondary | ICD-10-CM

## 2024-04-09 MED ORDER — ROSUVASTATIN CALCIUM 10 MG PO TABS: ORAL_TABLET | ORAL | 1 refills | Status: DC

## 2024-04-09 NOTE — Telephone Encounter (Signed)
 Medication has been sent into pt's pharmacy.

## 2024-04-09 NOTE — Telephone Encounter (Signed)
 Copied from CRM [id number]. Topic: Clinical - Medication Refill >> Apr 09, 2024  8:38 AM Chasity T wrote: Medication: rosuvastatin  (CRESTOR ) 10 MG tablet   Has the patient contacted their pharmacy? Yes (Agent: If no, request that the patient contact the pharmacy for the refill. If patient does not wish to contact the pharmacy document the reason why and proceed with request.) (Agent: If yes, when and what did the pharmacy advise?)  This is the patient's preferred pharmacy:  [id number] - [place], [place] - [address] [facility] [address] [place] [address] Phone: [phone] Fax: [phone]   Is this the correct pharmacy for this prescription? Yes If no, delete pharmacy and type the correct one.   Has the prescription been filled recently? No  Is the patient out of the medication? Yes  Has the patient been seen for an appointment in the last year OR does the patient have an upcoming appointment? Yes  Can we respond through MyChart? Yes  Agent: Please be advised that Rx refills may take up to 3 business days. We ask that you follow-up with your pharmacy.

## 2024-04-10 NOTE — Telephone Encounter (Signed)
 Refill was sent 04/06/24

## 2024-04-16 ENCOUNTER — Telehealth: Payer: Self-pay | Admitting: Cardiovascular Disease

## 2024-04-16 ENCOUNTER — Encounter: Payer: Self-pay | Admitting: Cardiovascular Disease

## 2024-04-16 ENCOUNTER — Ambulatory Visit: Admitting: Cardiovascular Disease

## 2024-04-16 VITALS — BP 130/90 | HR 65 | Ht 69.5 in | Wt 230.0 lb

## 2024-04-16 DIAGNOSIS — I10 Essential (primary) hypertension: Secondary | ICD-10-CM | POA: Diagnosis not present

## 2024-04-16 DIAGNOSIS — E782 Mixed hyperlipidemia: Secondary | ICD-10-CM | POA: Diagnosis not present

## 2024-04-16 DIAGNOSIS — I2510 Atherosclerotic heart disease of native coronary artery without angina pectoris: Secondary | ICD-10-CM

## 2024-04-16 MED ORDER — REPATHA SURECLICK 140 MG/ML ~~LOC~~ SOAJ: 140.0000 mg | SUBCUTANEOUS | 3 refills | Status: AC

## 2024-04-16 MED ORDER — ROSUVASTATIN CALCIUM 10 MG PO TABS: ORAL_TABLET | ORAL | 3 refills | Status: AC

## 2024-04-16 MED ORDER — NITROGLYCERIN 0.4 MG SL SUBL: 0.4000 mg | SUBLINGUAL_TABLET | SUBLINGUAL | 3 refills | Status: AC | PRN

## 2024-04-16 NOTE — Patient Instructions (Signed)
 Medication Instructions:  The current medical regimen is effective;  continue present plan and medications.  *If you need a refill on your cardiac medications before your next appointment, please call your pharmacy*  Follow-Up: At [facility], you and your health needs are our priority.  As part of our continuing mission to provide you with exceptional heart care, our providers are all part of one team.  This team includes your primary Cardiologist (physician) and Advanced Practice Providers or APPs (Physician Assistants and Nurse Practitioners) who all work together to provide you with the care you need, when you need it.  Your next appointment:   1 year(s)  Provider:   Lonni Cash, MD    We recommend signing up for the patient portal called MyChart.  Sign up information is provided on this After Visit Summary.  MyChart is used to connect with patients for Virtual Visits (Telemedicine).  Patients are able to view lab/test results, encounter notes, upcoming appointments, etc.  Non-urgent messages can be sent to your provider as well.   To learn more about what you can do with MyChart, go to [email or web address].

## 2024-04-17 ENCOUNTER — Other Ambulatory Visit (HOSPITAL_COMMUNITY): Payer: Self-pay

## 2024-04-17 ENCOUNTER — Telehealth: Payer: Self-pay | Admitting: Pharmacy Technician

## 2024-04-17 NOTE — Telephone Encounter (Signed)
 Pharmacy Patient Advocate Encounter   Received notification from Pt Calls Messages that prior authorization for Repatha  is required/requested.   Insurance verification completed.   The patient is insured through [facility] [employer].   Per test claim: The current 04/17/24 day co-pay is, $405.84- one month.  No PA needed at this time. This test claim was processed through [facility]- copay amounts may vary at other pharmacies due to pharmacy/plan contracts, or as the patient moves through the different stages of their insurance plan.

## 2024-04-18 ENCOUNTER — Other Ambulatory Visit: Payer: Self-pay | Admitting: Internal Medicine

## 2024-04-18 DIAGNOSIS — E1129 Type 2 diabetes mellitus with other diabetic kidney complication: Secondary | ICD-10-CM

## 2024-04-18 DIAGNOSIS — N1832 Chronic kidney disease, stage 3b: Secondary | ICD-10-CM

## 2024-04-18 DIAGNOSIS — E118 Type 2 diabetes mellitus with unspecified complications: Secondary | ICD-10-CM

## 2024-04-19 ENCOUNTER — Telehealth: Payer: Self-pay | Admitting: Pharmacy Technician

## 2024-04-19 ENCOUNTER — Other Ambulatory Visit (HOSPITAL_COMMUNITY): Payer: Self-pay

## 2024-04-19 NOTE — Telephone Encounter (Signed)
 I spoke with patient. It looks to me like his grant expired in Sept. Can we please renew and send info to him in [place].

## 2024-04-19 NOTE — Telephone Encounter (Signed)
 Patient Advocate Encounter   The patient was approved for a [employer] grant that will help cover the cost of Repatha  Total amount awarded, 2500.00.  Effective: 03/20/24 - 03/19/25   APW:[id number] ERW:[id number] Hmnle:[id number] PI:[id number]   [employer] ID: [id number]   Pharmacy provided with approval and processing information. Patient informed via mychart

## 2024-05-10 ENCOUNTER — Ambulatory Visit: Admitting: Cardiology

## 2024-07-12 ENCOUNTER — Ambulatory Visit

## 2024-07-19 ENCOUNTER — Encounter: Admitting: Internal Medicine

## 2025-01-22 ENCOUNTER — Encounter (INDEPENDENT_AMBULATORY_CARE_PROVIDER_SITE_OTHER): Admitting: Ophthalmology
